# Patient Record
Sex: Male | Born: 1960 | Race: White | Hispanic: No | Marital: Married | Smoking: Former smoker
Health system: Southern US, Community
[De-identification: ages and names within clinical notes are randomized; demographics above are authoritative.]

## PROBLEM LIST (undated history)

## (undated) DIAGNOSIS — M109 Gout, unspecified: Secondary | ICD-10-CM

## (undated) DIAGNOSIS — I10 Essential (primary) hypertension: Secondary | ICD-10-CM

## (undated) DIAGNOSIS — M1990 Unspecified osteoarthritis, unspecified site: Secondary | ICD-10-CM

## (undated) DIAGNOSIS — E119 Type 2 diabetes mellitus without complications: Secondary | ICD-10-CM

## (undated) DIAGNOSIS — M549 Dorsalgia, unspecified: Secondary | ICD-10-CM

## (undated) DIAGNOSIS — Z86718 Personal history of other venous thrombosis and embolism: Secondary | ICD-10-CM

## (undated) DIAGNOSIS — C801 Malignant (primary) neoplasm, unspecified: Secondary | ICD-10-CM

## (undated) HISTORY — DX: Gout, unspecified: M10.9

## (undated) HISTORY — DX: Type 2 diabetes mellitus without complications: E11.9

## (undated) HISTORY — DX: Dorsalgia, unspecified: M54.9

## (undated) HISTORY — DX: Essential (primary) hypertension: I10

## (undated) HISTORY — DX: Malignant (primary) neoplasm, unspecified: C80.1

## (undated) HISTORY — DX: Personal history of other venous thrombosis and embolism: Z86.718

---

## 2003-02-22 ENCOUNTER — Encounter: Payer: Self-pay | Admitting: Internal Medicine

## 2003-02-22 ENCOUNTER — Encounter: Admission: RE | Admit: 2003-02-22 | Discharge: 2003-02-22 | Payer: Self-pay | Admitting: Internal Medicine

## 2003-02-25 ENCOUNTER — Encounter: Admission: RE | Admit: 2003-02-25 | Discharge: 2003-02-25 | Payer: Self-pay | Admitting: Internal Medicine

## 2003-02-25 ENCOUNTER — Encounter: Payer: Self-pay | Admitting: Internal Medicine

## 2003-06-10 HISTORY — PX: KNEE SURGERY: SHX244

## 2003-06-28 ENCOUNTER — Ambulatory Visit (HOSPITAL_COMMUNITY): Admission: RE | Admit: 2003-06-28 | Discharge: 2003-06-28 | Payer: Self-pay | Admitting: Orthopedic Surgery

## 2003-06-28 ENCOUNTER — Ambulatory Visit (HOSPITAL_BASED_OUTPATIENT_CLINIC_OR_DEPARTMENT_OTHER): Admission: RE | Admit: 2003-06-28 | Discharge: 2003-06-28 | Payer: Self-pay | Admitting: Orthopedic Surgery

## 2003-08-30 ENCOUNTER — Ambulatory Visit (HOSPITAL_BASED_OUTPATIENT_CLINIC_OR_DEPARTMENT_OTHER): Admission: RE | Admit: 2003-08-30 | Discharge: 2003-08-30 | Payer: Self-pay | Admitting: Orthopedic Surgery

## 2004-09-10 ENCOUNTER — Inpatient Hospital Stay (HOSPITAL_COMMUNITY): Admission: AD | Admit: 2004-09-10 | Discharge: 2004-09-14 | Payer: Self-pay | Admitting: Family Medicine

## 2004-09-10 ENCOUNTER — Ambulatory Visit: Payer: Self-pay | Admitting: Family Medicine

## 2006-06-09 DIAGNOSIS — C801 Malignant (primary) neoplasm, unspecified: Secondary | ICD-10-CM | POA: Insufficient documentation

## 2006-06-09 HISTORY — DX: Malignant (primary) neoplasm, unspecified: C80.1

## 2007-06-10 HISTORY — PX: OTHER SURGICAL HISTORY: SHX169

## 2010-03-01 ENCOUNTER — Ambulatory Visit (HOSPITAL_BASED_OUTPATIENT_CLINIC_OR_DEPARTMENT_OTHER): Admission: RE | Admit: 2010-03-01 | Discharge: 2010-03-01 | Payer: Self-pay | Admitting: General Surgery

## 2010-10-25 NOTE — Op Note (Signed)
NAME:  Steven Hodge, Steven Hodge                           ACCOUNT NO.:  [id number]   MEDICAL RECORD NO.:  [id number]                   PATIENT TYPE:  AMB   LOCATION:  DSC                                  FACILITY:  [facility]   PHYSICIAN:  Thera Flake., M.D.             DATE OF BIRTH:  Dec 03, 1960   DATE OF PROCEDURE:  08/30/2003  DATE OF DISCHARGE:                                 OPERATIVE REPORT   PREOPERATIVE DIAGNOSIS:  Partial tear medial meniscus repair, right knee.   POSTOPERATIVE DIAGNOSIS:  Partial tear medial meniscus repair, right knee.   OPERATION PERFORMED:  Partial medial meniscectomy (30 to 40%).   SURGEON:  Dyke Brackett, M.D.   ANESTHESIA:  MAC.   INDICATIONS FOR PROCEDURE:  Right knee status post anterior cruciate  ligament meniscus repair, repetitive catching consistent with disruption of  his meniscus repair thought to be amenable to outpatient surgery.   DESCRIPTION OF PROCEDURE:  Arthroscoped through inferomedial and  inferolateral portal.  ACL graft looked excellent with good positioning and  good strength lateral compartment, lateral meniscus, patellofemoral joint  was by and large normal.  The medial meniscus, probably thought that  probably half the repair had healed.  The posterior horn portion had not.  This required resection of 30 to 40% of the meniscus back to a good stable  rim.  This was certainly consistent with preoperative complaints of catching  along the medial aspect of the knee.  Articular cartilage on either side of  this looked normal.  Knee drained free of fluids.  Portals of joint  infiltrated with Marcaine.  The patient was taken to the recovery room in  stable condition.                                               Thera Flake., M.D.    WDC/MEDQ  D:  08/30/2003  T:  08/31/2003  Job:  [id number]

## 2010-10-25 NOTE — Op Note (Signed)
NAME:  Steven Hodge, Steven Hodge                           ACCOUNT NO.:  [id number]   MEDICAL RECORD NO.:  [id number]                   PATIENT TYPE:  AMB   LOCATION:  DSC                                  FACILITY:  [facility]   PHYSICIAN:  Thera Flake., M.D.             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  Based on his age  and the presence of rather significant chondromalacia, he was advised he was  best off probably to consider reconstruction with allograft.  This will be  accomplished with overnight hospitalization.   PREOPERATIVE DIAGNOSIS:  1. 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  2. Posterior horn tear, medial meniscus, right knee.  3. Chondromalacia central and inferior patella, right knee.   POSTOPERATIVE DIAGNOSIS:  1. 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  2. Posterior horn tear, medial meniscus, right knee.  3. Chondromalacia central and inferior patella, right knee.   OPERATION:  1. Bone tendon bone allograft anterior cruciate ligament reconstruction,     right knee.  2. Meniscus repair (3 Clear Fix) medial meniscus.  3. Debridement chondromalacia of the patella.   SURGEON:  Dyke Brackett, M.D.   ANESTHESIA:  Jamelle Rushing, P.A.   TOURNIQUET TIME:  1 hour 30 minutes.   DESCRIPTION OF PROCEDURE:  Examination under anesthesia showed the patient  to have a positive Lachman 4+, 4+ pivot shift.  Post reconstruction it was  trace to 1+ Lachman present and no pivot shift.  He was arthroscoped through  an inferomedial and inferolateral portal.  The lateral compartment was  normal.  The medial meniscus showed a red white tear of the posterior horn  that fortunately was  repairable and was repaired with three Clear Fix  anchors with good purchase of meniscal tissues.  Prior to this, the meniscus  edges were roughened.  There was chondromalacia patella on the inferior half  of the patella and the patellofemoral joint was aggressively debrided.  A  bone tendon bone 11 mm allograft was prepared at the side table.  This was  followed by what was required as an aggressive notchplasty in view of  significant notch overgrowth.  The guide pin was placed on the tibia using  the Arthrex system referencing off the PCL just posterior to the normal  attachment site of the ACL.  Over reaming with an 11 mm reamer.  The bone  was moderately soft and then the Arthrex guide was used through the tibial  tunnel on the femoral side to create a pin site followed by reaming.  Once  the pin was placed all the way through  the metaphysis and tagged with the  holder, it was over-reamed with an 11 mm reamer to accept the graft.  The  graft was passed without difficulty.  Due to the rather soft bone, 30 by 9  mm screw was used on the femoral side and a 9 by 25 on the tibial side.  Excellent purchase was obtained.  Excellent position of the graft was seen.  A small amount of impingement from the medial portion of the lateral condyle  was noted and this was rasped off so there was absolutely no impingement of  the graft once this was done.  Full range of motion with no impingement.  Excellent stability noted.  Closure was effected with 0 Vicryl.  Accessory  incisions had been made, one through the patellar tendon for placement of  the guide-wire into the femur as well as one on the tibial side for  placement of the graft and these were the portals that were closed.  A  compressive sterile dressing was applied.  The patient went to the recovery  room in stable condition.                                               Thera Flake., M.D.    WDC/MEDQ  D:  06/28/2003  T:  06/28/2003   Job:  [id number]

## 2010-10-25 NOTE — Discharge Summary (Signed)
NAME:  XANE, AMSDEN NO.:  [id number]   MEDICAL RECORD NO.:  [id number]          PATIENT TYPE:  INP   LOCATION:  3008                         FACILITY:  [facility]   PHYSICIAN:  Rodolph Bong, M.D.     DATE OF BIRTH:  [birth year]   DATE OF ADMISSION:  09/10/2004  DATE OF DISCHARGE:  09/14/2004                                 DISCHARGE SUMMARY   PRIMARY CARE PHYSICIAN:  [facility].   DISCHARGE DIAGNOSIS:  Left-sided facial cellulitis, presumed methicillin  resistant Staphylococcus aureus.   DISCHARGE MEDICATIONS:  1.  Vancomycin 1500 mg IV q.12h. through Sunday, September 15, 2004.  2.  Doxycycline 100 mg p.o. b.i.d. for seven days after vancomycin      completed.  3.  Aspirin 81 mg daily.   BRIEF HISTORY AND PHYSICAL:  Mr. Ardoin is a pleasant 50 year old male who  presents with three day history of swelling in the left nasolabial area,  progressing to his entire left maxillary region.  This area was tender and  he reported chills prior to admission.  He was seen at Urgent Care and  empirically started on Bactrim for MRSA coverage.  The area began to  progress in terms of the swelling and erythema and he was sent to [facility] from Urgent Care.   HOSPITAL COURSE:  1.  Left facial cellulitis.  The patient was admitted with a history as      outlined above.  He was found to have a left facial cellulitis and was      begun on vancomycin therapy.  In addition Rocephin was used for      additional gram negative coverage.  Facial CT revealed left facial      cellulitis with element of preorbital cellulitis but no invasion or      intraorbital or retroorbital structures.  ENT was consulted who      recommended continuing IV antibiotics and reevaluation for progression      and possible necessary I&D.  While in the hospital Mr. Desena improved on      the IV vancomycin, only discharged home to complete a course for two      more days and then a week  additionally of doxycycline.  On the day of      discharge Mr. Danowski was comfortable and had significant improvement in      his cellulitis.   PAIN MANAGEMENT:  Ibuprofen 800 mg q.8h. p.r.n.   DIET:  Regular.   WOUND CARE:  Not applicable.   FOLLOW UP:  1.  Dr. Pollyann Kennedy at [phone].  Mr. Weatherly is to call on Monday or Tuesday for a      follow-up appointment.  2.  [facility] follow-up as needed.      AK/MEDQ  D:  09/14/2004  T:  09/14/2004  Job:  [id number]   cc:   [facility]   Jefry H. Pollyann Kennedy, MD  [phone] [address] [place]  [place] [address]  Fax: [phone]

## 2013-01-25 ENCOUNTER — Ambulatory Visit: Payer: BC Managed Care – PPO

## 2013-01-25 ENCOUNTER — Ambulatory Visit (INDEPENDENT_AMBULATORY_CARE_PROVIDER_SITE_OTHER): Payer: BC Managed Care – PPO | Admitting: Physician Assistant

## 2013-01-25 VITALS — BP 146/88 | HR 85 | Temp 98.3°F | Resp 18 | Ht 68.5 in | Wt 303.2 lb

## 2013-01-25 DIAGNOSIS — M79675 Pain in left toe(s): Secondary | ICD-10-CM

## 2013-01-25 DIAGNOSIS — M109 Gout, unspecified: Secondary | ICD-10-CM

## 2013-01-25 DIAGNOSIS — M79609 Pain in unspecified limb: Secondary | ICD-10-CM

## 2013-01-25 LAB — POCT CBC
Granulocyte percent: 66 %G (ref 37–80)
HCT, POC: 47.2 % (ref 43.5–53.7)
Hemoglobin: 15.5 g/dL (ref 14.1–18.1)
Lymph, poc: 1.9 (ref 0.6–3.4)
MCH, POC: 31.1 pg (ref 27–31.2)
MCHC: 32.8 g/dL (ref 31.8–35.4)
MCV: 94.5 fL (ref 80–97)
MID (cbc): 0.7 (ref 0–0.9)
MPV: 11.4 fL (ref 0–99.8)
POC Granulocyte: 5.1 (ref 2–6.9)
POC LYMPH PERCENT: 25.1 %L (ref 10–50)
POC MID %: 8.9 %M (ref 0–12)
Platelet Count, POC: 177 10*3/uL (ref 142–424)
RBC: 4.99 M/uL (ref 4.69–6.13)
RDW, POC: 13.6 %
WBC: 7.7 10*3/uL (ref 4.6–10.2)

## 2013-01-25 LAB — BASIC METABOLIC PANEL
BUN: 13 mg/dL (ref 6–23)
CO2: 23 mEq/L (ref 19–32)
Calcium: 9.4 mg/dL (ref 8.4–10.5)
Chloride: 103 mEq/L (ref 96–112)
Creat: 0.93 mg/dL (ref 0.50–1.35)
Glucose, Bld: 154 mg/dL — ABNORMAL HIGH (ref 70–99)
Potassium: 4.4 mEq/L (ref 3.5–5.3)
Sodium: 137 mEq/L (ref 135–145)

## 2013-01-25 LAB — URIC ACID: Uric Acid, Serum: 7.1 mg/dL (ref 4.0–7.8)

## 2013-01-25 MED ORDER — HYDROCODONE-ACETAMINOPHEN 5-325 MG PO TABS: 1.0000 | ORAL_TABLET | Freq: Four times a day (QID) | ORAL | Status: DC | PRN

## 2013-01-25 MED ORDER — PREDNISONE 20 MG PO TABS: ORAL_TABLET | ORAL | Status: DC

## 2013-01-25 NOTE — Progress Notes (Signed)
Subjective:    Patient ID: Steven Hodge, male    DOB: 1960-07-13, 52 y.o.   MRN: [id number]  HPI 52 y.o. Patient with left foot pain x 6 days. Patient noticed his left great toe hurting on Thursday.  No known injury or trauma. It began as mild swelling and read area and progressed to swelling and painful red area. It began to really hurt Friday and Saturday, improved on Sunday but yesterday it was very painful. He has been taking 1200 mg/day of Ibuprofen since Friday with some relief. He states that it looks and feels much better today. He has not had any change in diet or activity. He does eat red meat 2-3 x/week and shell fish. He says that he tries to watch his diet and decrease his fried food intake but states that he "not perfect". He does not drink any alcohol and no tobacco use. He has history of gout in his left elbow with first flare at 52 y.o. He has had one other flare since then but it has been many years. He was never given anything for it. He has NKDA.    Review of Systems  Constitutional: Negative for fever, chills and fatigue.  HENT: Negative for congestion, sore throat, trouble swallowing and sinus pressure.   Eyes: Negative for visual disturbance.  Respiratory: Negative for choking, chest tightness and shortness of breath.   Cardiovascular: Negative for chest pain, palpitations and leg swelling.  Gastrointestinal: Negative for nausea, vomiting, abdominal pain and diarrhea.  Genitourinary: Negative for difficulty urinating.  Musculoskeletal: Positive for joint swelling and arthralgias. Negative for myalgias.  Skin: Negative for pallor and rash.  Neurological: Negative for light-headedness and headaches.  All other systems reviewed and are negative.      Objective:   Physical Exam  Nursing note and vitals reviewed. Constitutional: He is oriented to person, place, and time. Vital signs are normal. He appears well-developed and well-nourished. No distress.  HENT:  Head:  Normocephalic and atraumatic.  Right Ear: External ear normal.  Left Ear: External ear normal.  Nose: Nose normal.  Eyes: Conjunctivae and lids are normal.  Neck: Trachea normal and normal range of motion. Neck supple. No thyromegaly present.  Cardiovascular: Normal rate, regular rhythm and normal heart sounds.   Pulmonary/Chest: Effort normal and breath sounds normal.  Musculoskeletal:       Left ankle: He exhibits decreased range of motion (great toe, 2nd to joint stiffness) and swelling. He exhibits no ecchymosis, no deformity, no laceration and normal pulse. Tenderness (of MTP). No lateral malleolus, no medial malleolus, no CF ligament, no posterior TFL and no head of 5th metatarsal tenderness found. Achilles tendon normal.       Feet:  Erythema, warmth of 1st MTP joint; decrease ROM secondary to stiffness and pain in the joint  Lymphadenopathy:    He has no cervical adenopathy.  Neurological: He is alert and oriented to person, place, and time. He has normal strength and normal reflexes. No cranial nerve deficit or sensory deficit.  Skin: Skin is warm, dry and intact. No rash noted. He is not diaphoretic. No pallor.  Psychiatric: He has a normal mood and affect. His speech is normal and behavior is normal. Judgment and thought content normal. Cognition and memory are normal.   Results for orders placed in visit on 01/25/13  POCT CBC      Result Value Range   WBC 7.7  4.6 - 10.2 K/uL   Lymph, poc 1.9  0.6 -  3.4   POC LYMPH PERCENT 25.1  10 - 50 %L   MID (cbc) 0.7  0 - 0.9   POC MID % 8.9  0 - 12 %M   POC Granulocyte 5.1  2 - 6.9   Granulocyte percent 66.0  37 - 80 %G   RBC 4.99  4.69 - 6.13 M/uL   Hemoglobin 15.5  14.1 - 18.1 g/dL   HCT, POC 16.1  09.6 - 53.7 %   MCV 94.5  80 - 97 fL   MCH, POC 31.1  27 - 31.2 pg   MCHC 32.8  31.8 - 35.4 g/dL   RDW, POC 04.5     Platelet Count, POC 177  142 - 424 K/uL   MPV 11.4  0 - 99.8 fL   Radiograph of left Great toe primary read by  Dr. Dallas Schimke. Joint space normal. Trophic changes present at MTP joint of 1st metatarsal. No acute abnormalities.     Assessment & Plan:  Gout -  Plan: POCT CBC, UricAcid, Basic metabolic panel, DG Toe Great Left, predniSONE (DELTASONE) 20 MG tablet. Discussed with patient prednisone taper for immediate relief of gout flare. Patient declined Norco for pain due to previous poor experience taking medication. He will take Tylenol if needed for pain.   Will contact patient tomorrow with his lab results. Based on results of uric acid level will discuss with patient dietary and lifestyle modifications as well as maintenance therapy if indicated.   Patient seen with and examined by Eula Listen, PA-C. Agree with above.   Eula Listen, PA-C 01/25/2013 6:31 PM

## 2013-01-26 ENCOUNTER — Other Ambulatory Visit: Payer: Self-pay | Admitting: Physician Assistant

## 2013-01-26 DIAGNOSIS — M109 Gout, unspecified: Secondary | ICD-10-CM

## 2015-02-27 ENCOUNTER — Ambulatory Visit (INDEPENDENT_AMBULATORY_CARE_PROVIDER_SITE_OTHER): Payer: BLUE CROSS/BLUE SHIELD | Admitting: Internal Medicine

## 2015-02-27 ENCOUNTER — Other Ambulatory Visit: Payer: Self-pay | Admitting: Internal Medicine

## 2015-02-27 ENCOUNTER — Ambulatory Visit (HOSPITAL_COMMUNITY)
Admission: RE | Admit: 2015-02-27 | Discharge: 2015-02-27 | Disposition: A | Discharge status: Home / Self Care | Payer: BLUE CROSS/BLUE SHIELD | Source: Ambulatory Visit | Attending: Internal Medicine | Admitting: Internal Medicine

## 2015-02-27 ENCOUNTER — Ambulatory Visit (INDEPENDENT_AMBULATORY_CARE_PROVIDER_SITE_OTHER): Payer: BLUE CROSS/BLUE SHIELD

## 2015-02-27 VITALS — BP 122/76 | HR 87 | Temp 98.9°F | Resp 20 | Ht 68.75 in | Wt 265.4 lb

## 2015-02-27 DIAGNOSIS — M79601 Pain in right arm: Secondary | ICD-10-CM

## 2015-02-27 DIAGNOSIS — M79661 Pain in right lower leg: Secondary | ICD-10-CM | POA: Insufficient documentation

## 2015-02-27 DIAGNOSIS — M25461 Effusion, right knee: Secondary | ICD-10-CM | POA: Diagnosis not present

## 2015-02-27 DIAGNOSIS — M25561 Pain in right knee: Secondary | ICD-10-CM | POA: Diagnosis not present

## 2015-02-27 DIAGNOSIS — E11628 Type 2 diabetes mellitus with other skin complications: Secondary | ICD-10-CM

## 2015-02-27 DIAGNOSIS — R7309 Other abnormal glucose: Secondary | ICD-10-CM | POA: Diagnosis not present

## 2015-02-27 DIAGNOSIS — R509 Fever, unspecified: Secondary | ICD-10-CM

## 2015-02-27 DIAGNOSIS — M79604 Pain in right leg: Secondary | ICD-10-CM

## 2015-02-27 DIAGNOSIS — L03115 Cellulitis of right lower limb: Secondary | ICD-10-CM

## 2015-02-27 LAB — POCT CBC
Granulocyte percent: 79.9 %G (ref 37–80)
HCT, POC: 47 % (ref 43.5–53.7)
Hemoglobin: 15.5 g/dL (ref 14.1–18.1)
Lymph, poc: 2.2 (ref 0.6–3.4)
MCH, POC: 28.7 pg (ref 27–31.2)
MCHC: 32.9 g/dL (ref 31.8–35.4)
MCV: 87 fL (ref 80–97)
MID (cbc): 0.5 (ref 0–0.9)
MPV: 9 fL (ref 0–99.8)
POC Granulocyte: 10.7 — AB (ref 2–6.9)
POC LYMPH PERCENT: 16.6 %L (ref 10–50)
POC MID %: 3.5 %M (ref 0–12)
Platelet Count, POC: 166 10*3/uL (ref 142–424)
RBC: 5.41 M/uL (ref 4.69–6.13)
RDW, POC: 12.4 %
WBC: 13.4 10*3/uL — AB (ref 4.6–10.2)

## 2015-02-27 LAB — POCT URINALYSIS DIP (MANUAL ENTRY)
Bilirubin, UA: NEGATIVE
Glucose, UA: 500 — AB
Leukocytes, UA: NEGATIVE
Nitrite, UA: NEGATIVE
Protein Ur, POC: 100 — AB
Spec Grav, UA: 1.01
Urobilinogen, UA: 0.2
pH, UA: 5.5

## 2015-02-27 LAB — POCT GLYCOSYLATED HEMOGLOBIN (HGB A1C): Hemoglobin A1C: 12.1

## 2015-02-27 LAB — COMPREHENSIVE METABOLIC PANEL
ALT: 58 U/L — ABNORMAL HIGH (ref 9–46)
AST: 14 U/L (ref 10–35)
Albumin: 4 g/dL (ref 3.6–5.1)
Alkaline Phosphatase: 78 U/L (ref 40–115)
BUN: 11 mg/dL (ref 7–25)
CO2: 24 mmol/L (ref 20–31)
Calcium: 9 mg/dL (ref 8.6–10.3)
Chloride: 99 mmol/L (ref 98–110)
Creat: 0.77 mg/dL (ref 0.70–1.33)
Glucose, Bld: 362 mg/dL — ABNORMAL HIGH (ref 65–99)
Potassium: 4.1 mmol/L (ref 3.5–5.3)
Sodium: 136 mmol/L (ref 135–146)
Total Bilirubin: 1 mg/dL (ref 0.2–1.2)
Total Protein: 6.9 g/dL (ref 6.1–8.1)

## 2015-02-27 LAB — POC MICROSCOPIC URINALYSIS (UMFC): Mucus: ABSENT

## 2015-02-27 LAB — GLUCOSE, POCT (MANUAL RESULT ENTRY): POC Glucose: 356 mg/dl — AB (ref 70–99)

## 2015-02-27 MED ORDER — GLIPIZIDE 5 MG PO TABS: 5.0000 mg | ORAL_TABLET | Freq: Two times a day (BID) | ORAL | Status: DC

## 2015-02-27 MED ORDER — METFORMIN HCL 500 MG PO TABS: 500.0000 mg | ORAL_TABLET | Freq: Two times a day (BID) | ORAL | Status: DC

## 2015-02-27 MED ORDER — DOXYCYCLINE HYCLATE 100 MG PO TABS: 100.0000 mg | ORAL_TABLET | Freq: Two times a day (BID) | ORAL | Status: DC

## 2015-02-27 MED ORDER — CEFTRIAXONE SODIUM 1 G IJ SOLR
1.0000 g | Freq: Once | INTRAMUSCULAR | Status: AC
  Administered 2015-02-27: 1 g via INTRAMUSCULAR

## 2015-02-27 NOTE — Progress Notes (Signed)
Preliminary report by tech - Right Lower Ext. Venous Duplex Completed. Negative for deep and superficial vein thrombosis in the right lower extremity. Oda Cogan, BS, RDMS, RVT

## 2015-02-27 NOTE — Patient Instructions (Addendum)
Please go to [facility] for your scheduled Venous Doppler at 11 am today. Check in through admitting which is located off of [address] Someone from the Radiology department will come and get you from there.       Cellulitis Cellulitis is an infection of the skin and the tissue beneath it. The infected area is usually red and tender. Cellulitis occurs most often in the arms and lower legs.  CAUSES  Cellulitis is caused by bacteria that enter the skin through cracks or cuts in the skin. The most common types of bacteria that cause cellulitis are staphylococci and streptococci. SIGNS AND SYMPTOMS   Redness and warmth.  Swelling.  Tenderness or pain.  Fever. DIAGNOSIS  Your health care provider can usually determine what is wrong based on a physical exam. Blood tests may also be done. TREATMENT  Treatment usually involves taking an antibiotic medicine. HOME CARE INSTRUCTIONS   Take your antibiotic medicine as directed by your health care provider. Finish the antibiotic even if you start to feel better.  Keep the infected arm or leg elevated to reduce swelling.  Apply a warm cloth to the affected area up to 4 times per day to relieve pain.  Take medicines only as directed by your health care provider.  Keep all follow-up visits as directed by your health care provider. SEEK MEDICAL CARE IF:   You notice red streaks coming from the infected area.  Your red area gets larger or turns dark in color.  Your bone or joint underneath the infected area becomes painful after the skin has healed.  Your infection returns in the same area or another area.  You notice a swollen bump in the infected area.  You develop new symptoms.  You have a fever. SEEK IMMEDIATE MEDICAL CARE IF:   You feel very sleepy.  You develop vomiting or diarrhea.  You have a general ill feeling (malaise) with muscle aches and pains. MAKE SURE YOU:   Understand these  instructions.  Will watch your condition.  Will get help right away if you are not doing well or get worse. Document Released: 03/05/2005 Document Revised: 10/10/2013 Document Reviewed: 08/11/2011 [facility] Patient Information 2015 [place], [place]. This information is not intended to replace advice given to you by your health care provider. Make sure you discuss any questions you have with your health care provider. Basic Carbohydrate Counting for Diabetes Mellitus Carbohydrate counting is a method for keeping track of the amount of carbohydrates you eat. Eating carbohydrates naturally increases the level of sugar (glucose) in your blood, so it is important for you to know the amount that is okay for you to have in every meal. Carbohydrate counting helps keep the level of glucose in your blood within normal limits. The amount of carbohydrates allowed is different for every person. A dietitian can help you calculate the amount that is right for you. Once you know the amount of carbohydrates you can have, you can count the carbohydrates in the foods you want to eat. Carbohydrates are found in the following foods:  Grains, such as breads and cereals.  Dried beans and soy products.  Starchy vegetables, such as potatoes, peas, and corn.  Fruit and fruit juices.  Milk and yogurt.  Sweets and snack foods, such as cake, cookies, candy, chips, soft drinks, and fruit drinks. CARBOHYDRATE COUNTING There are two ways to count the carbohydrates in your food. You can use either of the methods or a combination of both.  Reading the "Nutrition Facts" on [place] The "Nutrition Facts" is an area that is included on the labels of almost all packaged food and beverages in the [place]. It includes the serving size of that food or beverage and information about the nutrients in each serving of the food, including the grams (g) of carbohydrate per serving.  Decide the number of servings of this food or  beverage that you will be able to eat or drink. Multiply that number of servings by the number of grams of carbohydrate that is listed on the label for that serving. The total will be the amount of carbohydrates you will be having when you eat or drink this food or beverage. Learning Standard Serving Sizes of Food When you eat food that is not packaged or does not include "Nutrition Facts" on the label, you need to measure the servings in order to count the amount of carbohydrates.A serving of most carbohydrate-rich foods contains about 15 g of carbohydrates. The following list includes serving sizes of carbohydrate-rich foods that provide 15 g ofcarbohydrate per serving:   1 slice of bread (1 oz) or 1 six-inch tortilla.    of a hamburger bun or English muffin.  4-6 crackers.   cup unsweetened dry cereal.    cup hot cereal.   cup rice or pasta.    cup mashed potatoes or  of a large baked potato.  1 cup fresh fruit or one small piece of fruit.    cup canned or frozen fruit or fruit juice.  1 cup milk.   cup plain fat-free yogurt or yogurt sweetened with artificial sweeteners.   cup cooked dried beans or starchy vegetable, such as peas, corn, or potatoes.  Decide the number of standard-size servings that you will eat. Multiply that number of servings by 15 (the grams of carbohydrates in that serving). For example, if you eat 2 cups of strawberries, you will have eaten 2 servings and 30 g of carbohydrates (2 servings x 15 g = 30 g). For foods such as soups and casseroles, in which more than one food is mixed in, you will need to count the carbohydrates in each food that is included. EXAMPLE OF CARBOHYDRATE COUNTING Sample Dinner  3 oz chicken breast.   cup of brown rice.   cup of corn.  1 cup milk.   1 cup strawberries with sugar-free whipped topping.  Carbohydrate Calculation Step 1: Identify the foods that contain carbohydrates:   Rice.   Corn.    Milk.   Strawberries. Step 2:Calculate the number of servings eaten of each:   2 servings of rice.   1 serving of corn.   1 serving of milk.   1 serving of strawberries. Step 3: Multiply each of those number of servings by 15 g:   2 servings of rice x 15 g = 30 g.   1 serving of corn x 15 g = 15 g.   1 serving of milk x 15 g = 15 g.   1 serving of strawberries x 15 g = 15 g. Step 4: Add together all of the amounts to find the total grams of carbohydrates eaten: 30 g + 15 g + 15 g + 15 g = 75 g. Document Released: 05/26/2005 Document Revised: 10/10/2013 Document Reviewed: 04/22/2013 [facility] Patient Information 2015 [place], [place]. This information is not intended to replace advice given to you by your health care provider. Make sure you discuss any questions you have with your  health care provider.  

## 2015-02-27 NOTE — Progress Notes (Signed)
Patient ID: SY Steven Hodge, male   DOB: November 25, 1960, 54 y.o.   MRN: [id number]   02/27/2015 at 9:54 AM  Steven Hodge / DOB: 1960-11-13 / MRN: [id number]  Problem list reviewed and updated by me where necessary.   SUBJECTIVE  Steven Hodge is a 54 y.o. ill appearing male presenting for the chief complaint of right leg and knee pain with swelling and redness. Started 3-4 days ago with chills, night sweats and no rash seen then. .   Has past hx of infection to right knee after ACL repair, years ago. No cellulitis since. Has not had check up or testing in many years, last glucose on record was 154, he did not know about it.   He  has no past medical history on file.    Medications reviewed and updated by myself where necessary, and exist elsewhere in the encounter.   Steven Hodge has No Known Allergies. He  reports that he has quit smoking. He does not have any smokeless tobacco history on file. He reports that he does not drink alcohol or use illicit drugs. He  reports that he currently engages in sexual activity. The patient  has past surgical history that includes Knee surgery (2005); basel cell cancer removed (2009); and Joint replacement.  His family history includes Cancer in his brother, father, mother, and sister; Heart disease in his brother.  Review of Systems  Constitutional: Positive for fever, chills and diaphoresis.  Respiratory: Negative for cough and shortness of breath.   Cardiovascular: Positive for leg swelling. Negative for chest pain.  Gastrointestinal: Negative for nausea.  Musculoskeletal: Positive for myalgias.  Skin: Positive for rash.  Neurological: Negative for dizziness and headaches.    OBJECTIVE  His  height is 5' 8.75" (1.746 m) and weight is 265 lb 6.4 oz (120.385 kg). His oral temperature is 98.9 F (37.2 C). His blood pressure is 122/76 and his pulse is 87. His respiration is 20 and oxygen saturation is 98%.  The patient's body mass index is 39.49  kg/(m^2).  Physical Exam  Constitutional: He is oriented to person, place, and time. He appears well-developed and well-nourished. No distress.  HENT:  Head: Normocephalic.  Nose: Nose normal.  Eyes: Conjunctivae and EOM are normal.  Neck: Normal range of motion.  Cardiovascular: Normal rate, regular rhythm and normal heart sounds.   Respiratory: Effort normal and breath sounds normal. He exhibits no tenderness.  GI: Soft. He exhibits no mass. There is no tenderness.  Musculoskeletal: He exhibits edema and tenderness.       Right knee: He exhibits decreased range of motion, swelling and erythema. He exhibits no effusion.       Right lower leg: He exhibits tenderness, swelling and edema. He exhibits no bony tenderness, no deformity and no laceration.       Legs: Swollen, tender, red, to palpate  Knee joint appears not to be involved, has no pain with rom  Neurological: He is alert and oriented to person, place, and time. He exhibits normal muscle tone. Coordination normal.  Skin: Rash noted. There is erythema.  Psychiatric: He has a normal mood and affect.  [facility] reading (PRIMARY) by  Dr.Guest.knee no acute changes, osteoarthritis moderate, surgical pegs in place.    Results for orders placed or performed in visit on 02/27/15 (from the past 24 hour(s))  POCT urinalysis dipstick     Status: Abnormal   Collection Time: 02/27/15  9:00 AM  Result Value Ref Range  Color, UA yellow yellow   Clarity, UA clear clear   Glucose, UA =500 (A) negative   Bilirubin, UA negative negative   Ketones, POC UA moderate (40) (A) negative   Spec Grav, UA 1.010    Blood, UA trace-lysed (A) negative   pH, UA 5.5    Protein Ur, POC =100 (A) negative   Urobilinogen, UA 0.2    Nitrite, UA Negative Negative   Leukocytes, UA Negative Negative  POCT Microscopic Urinalysis ([facility])     Status: Abnormal   Collection Time: 02/27/15  9:00 AM  Result Value Ref Range   WBC,UR,HPF,POC Few (A) None WBC/hpf    RBC,UR,HPF,POC Few (A) None RBC/hpf   Bacteria None None   Mucus Absent Absent   Epithelial Cells, UR Per Microscopy None None cells/hpf  POCT CBC     Status: Abnormal   Collection Time: 02/27/15  9:31 AM  Result Value Ref Range   WBC 13.4 (A) 4.6 - 10.2 K/uL   Lymph, poc 2.2 0.6 - 3.4   POC LYMPH PERCENT 16.6 10 - 50 %L   MID (cbc) 0.5 0 - 0.9   POC MID % 3.5 0 - 12 %M   POC Granulocyte 10.7 (A) 2 - 6.9   Granulocyte percent 79.9 37 - 80 %G   RBC 5.41 4.69 - 6.13 M/uL   Hemoglobin 15.5 14.1 - 18.1 g/dL   HCT, POC 47.0 43.5 - 53.7 %   MCV 87.0 80 - 97 fL   MCH, POC 28.7 27 - 31.2 pg   MCHC 32.9 31.8 - 35.4 g/dL   RDW, POC 12.4 %   Platelet Count, POC 166 142 - 424 K/uL   MPV 9.0 0 - 99.8 fL  POCT glucose (manual entry)     Status: Abnormal   Collection Time: 02/27/15  9:31 AM  Result Value Ref Range   POC Glucose 356 (A) 70 - 99 mg/dl  POCT glycosylated hemoglobin (Hb A1C)     Status: None   Collection Time: 02/27/15  9:31 AM  Result Value Ref Range   Hemoglobin A1C 12.1     ASSESSMENT & PLAN  Steven Hodge was seen today for leg problem.  Diagnoses and all orders for this visit:  Pain of knee and lower leg, right -     POCT CBC -     POCT glucose (manual entry) -     POCT glycosylated hemoglobin (Hb A1C) -     POCT urinalysis dipstick -     POCT Microscopic Urinalysis ([facility]) -     Comprehensive metabolic panel -     Cancel: DG Knee 1-2 Views Left; Future -     Cancel: DG Knee 1-2 Views Right; Future -     LE VENOUS; Future  Fever chills -     POCT CBC -     POCT glucose (manual entry) -     POCT glycosylated hemoglobin (Hb A1C) -     POCT urinalysis dipstick -     POCT Microscopic Urinalysis ([facility]) -     Comprehensive metabolic panel -     Cancel: DG Knee 1-2 Views Left; Future -     LE VENOUS; Future  Cellulitis of leg, right -     POCT CBC -     POCT glucose (manual entry) -     POCT glycosylated hemoglobin (Hb A1C) -     POCT urinalysis dipstick -     POCT  Microscopic Urinalysis ([facility]) -  Comprehensive metabolic panel -     Cancel: DG Knee 1-2 Views Left; Future -     LE VENOUS; Future  Swollen R knee -     POCT CBC -     POCT glucose (manual entry) -     POCT glycosylated hemoglobin (Hb A1C) -     POCT urinalysis dipstick -     POCT Microscopic Urinalysis ([facility]) -     Comprehensive metabolic panel -     Cancel: DG Knee 1-2 Views Left; Future -     Cancel: DG Knee 1-2 Views Right; Future -     LE VENOUS; Future  Elevated glucose -     POCT CBC -     POCT glucose (manual entry) -     POCT glycosylated hemoglobin (Hb A1C) -     POCT urinalysis dipstick -     POCT Microscopic Urinalysis ([facility]) -     Comprehensive metabolic panel -     Cancel: DG Knee 1-2 Views Left; Future -     LE VENOUS; Future

## 2015-02-28 ENCOUNTER — Ambulatory Visit (INDEPENDENT_AMBULATORY_CARE_PROVIDER_SITE_OTHER): Payer: BLUE CROSS/BLUE SHIELD | Admitting: Family Medicine

## 2015-02-28 VITALS — BP 118/80 | HR 87 | Temp 99.7°F | Resp 16 | Ht 68.0 in | Wt 264.0 lb

## 2015-02-28 DIAGNOSIS — L03115 Cellulitis of right lower limb: Secondary | ICD-10-CM | POA: Diagnosis not present

## 2015-02-28 DIAGNOSIS — E118 Type 2 diabetes mellitus with unspecified complications: Secondary | ICD-10-CM

## 2015-02-28 LAB — POCT CBC
Granulocyte percent: 83.8 %G — AB (ref 37–80)
HCT, POC: 47.6 % (ref 43.5–53.7)
Hemoglobin: 15.3 g/dL (ref 14.1–18.1)
Lymph, poc: 2 (ref 0.6–3.4)
MCH, POC: 28.1 pg (ref 27–31.2)
MCHC: 32.1 g/dL (ref 31.8–35.4)
MCV: 87.5 fL (ref 80–97)
MID (cbc): 0.2 (ref 0–0.9)
MPV: 9.3 fL (ref 0–99.8)
POC Granulocyte: 11.1 — AB (ref 2–6.9)
POC LYMPH PERCENT: 14.8 %L (ref 10–50)
POC MID %: 1.4 %M (ref 0–12)
Platelet Count, POC: 188 10*3/uL (ref 142–424)
RBC: 5.44 M/uL (ref 4.69–6.13)
RDW, POC: 12.9 %
WBC: 13.3 10*3/uL — AB (ref 4.6–10.2)

## 2015-02-28 LAB — GLUCOSE, POCT (MANUAL RESULT ENTRY): POC Glucose: 309 mg/dl — AB (ref 70–99)

## 2015-02-28 MED ORDER — BLOOD GLUCOSE METER KIT: PACK | Status: DC

## 2015-02-28 MED ORDER — CEFTRIAXONE SODIUM 1 G IJ SOLR
1.0000 g | Freq: Once | INTRAMUSCULAR | Status: AC
  Administered 2015-02-28: 1 g via INTRAMUSCULAR

## 2015-02-28 NOTE — Patient Instructions (Addendum)
Check blood sugar before breakfast fasting and about 2 hours after the main meal several days a week. Today record of it.  GU the doxycycline twice daily  In the event of her acutely getting worse at anytime go to the emergency room  If not a lot better by tomorrow come back for a recheck by Kem Boroughs PA  Read the American Diabetic Association website

## 2015-02-28 NOTE — Progress Notes (Signed)
Patient ID: Steven Hodge, male    DOB: 1960-07-11  Age: 54 y.o. MRN: [id number]  Chief Complaint  Patient presents with  . Wound Check    celluitis on lower right leg    Subjective:   Patient is subjectively feeling better than he was yesterday. Dr. guess told him not to take his medications for diabetes until after he had his fasting labs done today. He is not taking anything diabetes yet. He feels better. When he stands up his leg hurts. He had one little sweaty episode but he did not feel nearly as bad in children as he did the night before. He has not had his doxycycline this morning, though he took the doxycycline twice yesterday. We talked about metformin and loose bowels. He takes Metamucil anyhow and told him he might not need it. Current allergies, medications, problem list, past/family and social histories reviewed.  Objective:  BP 118/80 mmHg  Pulse 87  Temp(Src) 99.7 F (37.6 C) (Oral)  Resp 16  Ht $R'5\' 8"'IB$  (1.727 m)  Wt 264 lb (119.75 kg)  BMI 40.15 kg/m2  SpO2 98%  Erythematous medial thigh down to the knee, posterior leg above the popliteal fossa, and erythema from about 3 or 4 inches below the knee down to the ankle. The erythema is not as bright red as it was yesterday. No inguinal nodes.  Results for orders placed or performed in visit on 02/28/15  POCT CBC  Result Value Ref Range   WBC 13.3 (A) 4.6 - 10.2 K/uL   Lymph, poc 2.0 0.6 - 3.4   POC LYMPH PERCENT 14.8 10 - 50 %L   MID (cbc) 0.2 0 - 0.9   POC MID % 1.4 0 - 12 %M   POC Granulocyte 11.1 (A) 2 - 6.9   Granulocyte percent 83.8 (A) 37 - 80 %G   RBC 5.44 4.69 - 6.13 M/uL   Hemoglobin 15.3 14.1 - 18.1 g/dL   HCT, POC 47.6 43.5 - 53.7 %   MCV 87.5 80 - 97 fL   MCH, POC 28.1 27 - 31.2 pg   MCHC 32.1 31.8 - 35.4 g/dL   RDW, POC 12.9 %   Platelet Count, POC 188.0 142 - 424 K/uL   MPV 9.3 0 - 99.8 fL  POCT glucose (manual entry)  Result Value Ref Range   POC Glucose 309 (A) 70 - 99 mg/dl    Assessment &  Plan:   Assessment: 1. Cellulitis of right lower extremity   2. Type 2 diabetes mellitus with complication       Plan: Orders Placed This Encounter  Procedures  . POCT CBC  . POCT glucose (manual entry)    Meds ordered this encounter  Medications  . cefTRIAXone (ROCEPHIN) injection 1 g    Sig:     Order Specific Question:  Antibiotic Indication:    Answer:  Cellulitis  . blood glucose meter kit and supplies    Sig: Dispense based on patient and insurance preference. Use up to four times daily as directed. (FOR ICD-9 250.00, 250.01).    Dispense:  1 each    Refill:  0    Order Specific Question:  Number of strips    Answer:  100    Order Specific Question:  Number of lancets    Answer:  100     There is some subjective improvement and labs are about plateaued. Will treat with ceftriaxone 1 g. Continue his other medications. Prescribed him with a  glucose meter. Return tomorrow and less substantially better   Patient Instructions  Check blood sugar before breakfast fasting and about 2 hours after the main meal several days a week. Today record of it.  GU the doxycycline twice daily  In the event of her acutely getting worse at anytime go to the emergency room  If not a lot better by tomorrow come back for a recheck by Kem Boroughs PA  Read the American Diabetic Association website     Return in about 1 day (around 03/01/2015).   HOPPER,DAVID, MD 02/28/2015

## 2015-03-01 ENCOUNTER — Ambulatory Visit (INDEPENDENT_AMBULATORY_CARE_PROVIDER_SITE_OTHER): Payer: BLUE CROSS/BLUE SHIELD | Admitting: Physician Assistant

## 2015-03-01 VITALS — BP 140/86 | HR 76 | Temp 97.6°F | Resp 18 | Ht 68.0 in | Wt 266.2 lb

## 2015-03-01 DIAGNOSIS — E118 Type 2 diabetes mellitus with unspecified complications: Secondary | ICD-10-CM | POA: Diagnosis not present

## 2015-03-01 DIAGNOSIS — L03115 Cellulitis of right lower limb: Secondary | ICD-10-CM

## 2015-03-01 LAB — POCT CBC
Granulocyte percent: 77 %G (ref 37–80)
HCT, POC: 46 % (ref 43.5–53.7)
Hemoglobin: 14.8 g/dL (ref 14.1–18.1)
Lymph, poc: 2.3 (ref 0.6–3.4)
MCH, POC: 28 pg (ref 27–31.2)
MCHC: 32.2 g/dL (ref 31.8–35.4)
MCV: 86.9 fL (ref 80–97)
MID (cbc): 0.5 (ref 0–0.9)
MPV: 9.5 fL (ref 0–99.8)
POC Granulocyte: 9.2 — AB (ref 2–6.9)
POC LYMPH PERCENT: 19.2 %L (ref 10–50)
POC MID %: 3.8 %M (ref 0–12)
Platelet Count, POC: 206 10*3/uL (ref 142–424)
RBC: 5.29 M/uL (ref 4.69–6.13)
RDW, POC: 12.7 %
WBC: 12 10*3/uL — AB (ref 4.6–10.2)

## 2015-03-01 LAB — GLUCOSE, POCT (MANUAL RESULT ENTRY): POC Glucose: 284 mg/dl — AB (ref 70–99)

## 2015-03-01 MED ORDER — CEFTRIAXONE SODIUM 1 G IJ SOLR
1.0000 g | Freq: Once | INTRAMUSCULAR | Status: AC
  Administered 2015-03-01: 1 g via INTRAMUSCULAR

## 2015-03-01 NOTE — Progress Notes (Signed)
Subjective:    Patient ID: Steven Hodge, male    DOB: Oct 12, 1960, 54 y.o.   MRN: [id number]  HPI Patient presents for cellulitis follow up diagnosed 2 days ago and being treated with Rocephin IM x2 and doxycycline po. States that he feel much better today and leg is not in as much pain. Area of redness shrinking as it is no longer behind and around knee. Denies fever and leg is not wheeping. Swelling is unchanged. Had chills when woke up this morning, but no fever.   Started metformin and glipizide yesterday and states that he is doing well on both without side effects. Will be able to pick up glucometer today. States that wife is pre-diabetic so doesn't "eat crazy" already.   Review of Systems  Constitutional: Positive for chills. Negative for fever and diaphoresis.  Cardiovascular: Positive for leg swelling.  Gastrointestinal: Negative for nausea, vomiting and diarrhea.  Skin: Positive for color change. Negative for wound.  Neurological: Negative for dizziness and headaches.       Objective:   Physical Exam  Constitutional: He is oriented to person, place, and time. He appears well-developed and well-nourished. No distress.  Blood pressure 140/86, pulse 76, temperature 97.6 F (36.4 C), temperature source Oral, resp. rate 18, height 5\' 8"  (1.727 m), weight 266 lb 3.2 oz (120.748 kg), SpO2 98 %.   HENT:  Head: Normocephalic and atraumatic.  Right Ear: External ear normal.  Left Ear: External ear normal.  Eyes: Conjunctivae are normal. Right eye exhibits no discharge. Left eye exhibits no discharge. No scleral icterus.  Pulmonary/Chest: Effort normal.  Neurological: He is alert and oriented to person, place, and time.  Skin: Skin is warm and dry. He is not diaphoretic. There is erythema (Area of erythema receding and mostly affecting calf and shin. Knee no longer affected.).  Psychiatric: He has a normal mood and affect. His behavior is normal. Judgment and thought content normal.    Results for orders placed or performed in visit on 03/01/15  POCT CBC  Result Value Ref Range   WBC 12.0 (A) 4.6 - 10.2 K/uL   Lymph, poc 2.3 0.6 - 3.4   POC LYMPH PERCENT 19.2 10 - 50 %L   MID (cbc) 0.5 0 - 0.9   POC MID % 3.8 0 - 12 %M   POC Granulocyte 9.2 (A) 2 - 6.9   Granulocyte percent 77.0 37 - 80 %G   RBC 5.29 4.69 - 6.13 M/uL   Hemoglobin 14.8 14.1 - 18.1 g/dL   HCT, POC 46.0 43.5 - 53.7 %   MCV 86.9 80 - 97 fL   MCH, POC 28.0 27 - 31.2 pg   MCHC 32.2 31.8 - 35.4 g/dL   RDW, POC 12.7 %   Platelet Count, POC 206 142 - 424 K/uL   MPV 9.5 0 - 99.8 fL  POCT glucose (manual entry)  Result Value Ref Range   POC Glucose 284 (A) 70 - 99 mg/dl   CBC Latest Ref Rng 03/01/2015 02/28/2015 02/27/2015  WBC 4.6 - 10.2 K/uL 12.0(A) 13.3(A) 13.4(A)  Hemoglobin 14.1 - 18.1 g/dL 14.8 15.3 15.5  Hematocrit 43.5 - 53.7 % 46.0 47.6 47.0       Assessment & Plan:  1. Cellulitis of right lower extremity Leg looking better and coupled with improving labs and patient feeling better, infection is improving. Continued follow up is not necessary at this time. Patient's wife concerned about being able to travel in the next 5  days. Warning signs discussed, however, if no improvement or worsening should RTC. She states that they will come in on the Sunday before they travel to have leg checked. - POCT CBC - cefTRIAXone (ROCEPHIN) injection 1 g; Inject 1 g into the muscle once.  2. Type 2 diabetes mellitus with complication Continue metformin and glipizide. Should monitor fasting glucose. Non-fasting numbers have improved some from 356 tp 284 today.  - POCT glucose (manual entry)   Alveta Heimlich PA-C  [facility] 03/01/2015 12:49 PM

## 2015-03-04 ENCOUNTER — Ambulatory Visit (INDEPENDENT_AMBULATORY_CARE_PROVIDER_SITE_OTHER): Payer: BLUE CROSS/BLUE SHIELD | Admitting: Internal Medicine

## 2015-03-04 VITALS — BP 124/80 | HR 85 | Temp 98.6°F | Resp 17 | Ht 69.5 in | Wt 265.0 lb

## 2015-03-04 DIAGNOSIS — L03115 Cellulitis of right lower limb: Secondary | ICD-10-CM | POA: Diagnosis not present

## 2015-03-04 DIAGNOSIS — E11628 Type 2 diabetes mellitus with other skin complications: Secondary | ICD-10-CM | POA: Diagnosis not present

## 2015-03-04 LAB — POCT CBC
Granulocyte percent: 68.5 %G (ref 37–80)
HCT, POC: 47.7 % (ref 43.5–53.7)
Hemoglobin: 15 g/dL (ref 14.1–18.1)
Lymph, poc: 2.4 (ref 0.6–3.4)
MCH, POC: 27.6 pg (ref 27–31.2)
MCHC: 31.4 g/dL — AB (ref 31.8–35.4)
MCV: 87.9 fL (ref 80–97)
MID (cbc): 0.4 (ref 0–0.9)
MPV: 8.6 fL (ref 0–99.8)
POC Granulocyte: 6 (ref 2–6.9)
POC LYMPH PERCENT: 27.3 %L (ref 10–50)
POC MID %: 4.2 %M (ref 0–12)
Platelet Count, POC: 332 10*3/uL (ref 142–424)
RBC: 5.42 M/uL (ref 4.69–6.13)
RDW, POC: 12.7 %
WBC: 8.8 10*3/uL (ref 4.6–10.2)

## 2015-03-04 LAB — GLUCOSE, POCT (MANUAL RESULT ENTRY): POC Glucose: 135 mg/dl — AB (ref 70–99)

## 2015-03-04 MED ORDER — CEFTRIAXONE SODIUM 1 G IJ SOLR
1.0000 g | Freq: Once | INTRAMUSCULAR | Status: AC
  Administered 2015-03-04: 1 g via INTRAMUSCULAR

## 2015-03-04 MED ORDER — CEFTRIAXONE SODIUM 1 G IJ SOLR: 1.0000 g | Freq: Once | INTRAMUSCULAR | Status: DC

## 2015-03-04 NOTE — Progress Notes (Signed)
Patient ID: Steven Hodge, male   DOB: [birth year], 54 y.o.   MRN: [id number]   03/04/2015 at 1:27 PM  Steven Hodge / DOB: 08-04-1960 / MRN: [id number]  Problem list reviewed and updated by me where necessary.   SUBJECTIVE  Steven Hodge is a 54 y.o. ill appearing male presenting for the chief complaint of pain, swelling and infection of right lower leg. He was dxed with cellulitis and new onset T2DM last week. See each visit. He feels the rocephin injections are improving the infection. He has 2 red tender masses one posterior above knee and one below..     He  has a past medical history of Diabetes mellitus without complication.    Medications reviewed and updated by myself where necessary, and exist elsewhere in the encounter.   [name] has No Known Allergies. He  reports that he has quit smoking. He does not have any smokeless tobacco history on file. He reports that he does not drink alcohol or use illicit drugs. He  reports that he currently engages in sexual activity. The patient  has past surgical history that includes Knee surgery (2005); basel cell cancer removed (2009); and Joint replacement.  His family history includes Cancer in his brother, father, mother, and sister; Heart disease in his brother.  Review of Systems  Constitutional: Negative for fever.  Respiratory: Negative for shortness of breath.   Cardiovascular: Negative for chest pain.  Gastrointestinal: Negative for nausea.  Skin: Negative for rash.  Neurological: Negative for dizziness and headaches.    OBJECTIVE  His  height is 5' 9.5" (1.765 m) and weight is 265 lb (120.203 kg). His oral temperature is 98.6 F (37 C). His blood pressure is 124/80 and his pulse is 85. His respiration is 17 and oxygen saturation is 98%.  The patient's body mass index is 38.59 kg/(m^2).  Physical Exam  Constitutional: He is oriented to person, place, and time. He appears well-developed and well-nourished. He appears distressed.   HENT:  Head: Normocephalic.  Nose: Nose normal.  Eyes: Conjunctivae and EOM are normal. Pupils are equal, round, and reactive to light.  Neck: Normal range of motion.  Cardiovascular: Normal rate.   Respiratory: Effort normal.  Musculoskeletal: He exhibits edema and tenderness.  Neurological: He is alert and oriented to person, place, and time. He exhibits normal muscle tone. Coordination normal.  Skin: Skin is warm. Lesion and rash noted. Rash is maculopapular. Rash is not vesicular. There is erythema.     2 indurated masses  He states both getting smaller.  Psychiatric: He has a normal mood and affect. His behavior is normal.    Results for orders placed or performed in visit on 03/04/15 (from the past 24 hour(s))  POCT CBC     Status: Abnormal   Collection Time: 03/04/15  1:10 PM  Result Value Ref Range   WBC 8.8 4.6 - 10.2 K/uL   Lymph, poc 2.4 0.6 - 3.4   POC LYMPH PERCENT 27.3 10 - 50 %L   MID (cbc) 0.4 0 - 0.9   POC MID % 4.2 0 - 12 %M   POC Granulocyte 6.0 2 - 6.9   Granulocyte percent 68.5 37 - 80 %G   RBC 5.42 4.69 - 6.13 M/uL   Hemoglobin 15.0 14.1 - 18.1 g/dL   HCT, POC 47.7 43.5 - 53.7 %   MCV 87.9 80 - 97 fL   MCH, POC 27.6 27 - 31.2 pg   MCHC 31.4 (  A) 31.8 - 35.4 g/dL   RDW, POC 12.7 %   Platelet Count, POC 332 142 - 424 K/uL   MPV 8.6 0 - 99.8 fL  POCT glucose (manual entry)     Status: Abnormal   Collection Time: 03/04/15  1:10 PM  Result Value Ref Range   POC Glucose 135 (A) 70 - 99 mg/dl   Will reduce dose of glipizide. ASSESSMENT & PLAN  Steven Hodge was seen today for follow-up.  Diagnoses and all orders for this visit:  Type 2 diabetes mellitus with other skin complications -     POCT CBC -     POCT glucose (manual entry) -     cefTRIAXone (ROCEPHIN) 1 G injection; Inject 1 g into the muscle once. -     Ambulatory referral to General Surgery  Cellulitis of right lower extremity -     POCT CBC -     POCT glucose (manual entry) -      cefTRIAXone (ROCEPHIN) 1 G injection; Inject 1 g into the muscle once. -     Ambulatory referral to General Surgery   Continue metformin BID Reduce glipizide 5mg  qam, only take glipizide in evening if glucose over 200. See primary care here to continue diabetes care Refer to surgery consider ID leg

## 2015-03-05 ENCOUNTER — Telehealth: Payer: Self-pay | Admitting: Family Medicine

## 2015-03-05 ENCOUNTER — Ambulatory Visit (INDEPENDENT_AMBULATORY_CARE_PROVIDER_SITE_OTHER): Payer: BLUE CROSS/BLUE SHIELD | Admitting: Internal Medicine

## 2015-03-05 ENCOUNTER — Ambulatory Visit: Payer: BLUE CROSS/BLUE SHIELD

## 2015-03-05 VITALS — BP 120/88 | HR 83 | Temp 97.9°F | Resp 16 | Ht 68.0 in | Wt 259.4 lb

## 2015-03-05 DIAGNOSIS — L02419 Cutaneous abscess of limb, unspecified: Secondary | ICD-10-CM | POA: Diagnosis not present

## 2015-03-05 DIAGNOSIS — L03119 Cellulitis of unspecified part of limb: Secondary | ICD-10-CM | POA: Diagnosis not present

## 2015-03-05 MED ORDER — CEFTRIAXONE SODIUM 1 G IJ SOLR: 1.0000 g | Freq: Once | INTRAMUSCULAR | Status: DC

## 2015-03-05 MED ORDER — CEFTRIAXONE SODIUM 1 G IJ SOLR
1.0000 g | Freq: Once | INTRAMUSCULAR | Status: AC
  Administered 2015-03-05: 1 g via INTRAMUSCULAR

## 2015-03-05 NOTE — Patient Instructions (Addendum)
You have an appt today with Dr. Rosendo Gros @ [facility], [place], [place] [address] [phone]. Please arrive at 2:45 pm.

## 2015-03-05 NOTE — Telephone Encounter (Signed)
Patient spouse, Kennyth Lose, called regarding surgeon recommendation he seen today. Went to [facility] and was seen by Dr. Rosendo Gros, his assessment and plan was no abcess to be drained. 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 Recommended continue oral abx as well as any Rocephin injections as per primary care.   When does he need to come back for recheck?  Can a future order be placed for him to come in for Rocephin injections? If so, when does he need to return for next injection? Duration?  Also wanted to know an "approximate" date he might could return to work? He knows he will be out this week and would just like to know how much longer after this week it might be. Please advise Dr. Rosendo Gros note placed in Dr. Elder Cyphers box

## 2015-03-05 NOTE — Progress Notes (Signed)
Patient ID: Steven Hodge, male   DOB: [birth year], 54 y.o.   MRN: [id number]   03/05/2015 at 9:08 AM  Steven Hodge / DOB: 03/23/1961 / MRN: [id number]  Problem list reviewed and updated by me where necessary.   SUBJECTIVE  Steven Hodge is a 54 y.o. ill appearing male presenting for the chief complaint of cellulitis and pain and swelling of leg. See previous w/ups..     He  has a past medical history of Diabetes mellitus without complication.    Medications reviewed and updated by myself where necessary, and exist elsewhere in the encounter.   Steven Hodge has No Known Allergies. He  reports that he has quit smoking. He does not have any smokeless tobacco history on file. He reports that he does not drink alcohol or use illicit drugs. He  reports that he currently engages in sexual activity. The patient  has past surgical history that includes Knee surgery (2005); basel cell cancer removed (2009); and Joint replacement.  His family history includes Cancer in his brother, father, mother, and sister; Heart disease in his brother.  ROS  OBJECTIVE  His  height is 5\' 8"  (1.727 m) and weight is 259 lb 6.4 oz (117.663 kg). His oral temperature is 97.9 F (36.6 C). His blood pressure is 120/88 and his pulse is 83. His respiration is 16 and oxygen saturation is 98%.  The patient's body mass index is 39.45 kg/(m^2).  Physical Exam  Vitals reviewed. Constitutional: He is oriented to person, place, and time. He appears well-developed and well-nourished. No distress.  HENT:  Head: Normocephalic.  Nose: Nose normal.  Eyes: Conjunctivae and EOM are normal.  Cardiovascular: Normal rate.   Respiratory: Effort normal.  Musculoskeletal: He exhibits edema and tenderness.  Neurological: He is alert and oriented to person, place, and time. He exhibits normal muscle tone. Coordination normal.  Skin: Skin is intact. Lesion and rash noted. Rash is maculopapular. There is erythema.     Psychiatric: He has  a normal mood and affect.    Results for orders placed or performed in visit on 03/04/15 (from the past 24 hour(s))  POCT CBC     Status: Abnormal   Collection Time: 03/04/15  1:10 PM  Result Value Ref Range   WBC 8.8 4.6 - 10.2 K/uL   Lymph, poc 2.4 0.6 - 3.4   POC LYMPH PERCENT 27.3 10 - 50 %L   MID (cbc) 0.4 0 - 0.9   POC MID % 4.2 0 - 12 %M   POC Granulocyte 6.0 2 - 6.9   Granulocyte percent 68.5 37 - 80 %G   RBC 5.42 4.69 - 6.13 M/uL   Hemoglobin 15.0 14.1 - 18.1 g/dL   HCT, POC 47.7 43.5 - 53.7 %   MCV 87.9 80 - 97 fL   MCH, POC 27.6 27 - 31.2 pg   MCHC 31.4 (A) 31.8 - 35.4 g/dL   RDW, POC 12.7 %   Platelet Count, POC 332 142 - 424 K/uL   MPV 8.6 0 - 99.8 fL  POCT glucose (manual entry)     Status: Abnormal   Collection Time: 03/04/15  1:10 PM  Result Value Ref Range   POC Glucose 135 (A) 70 - 99 mg/dl    ASSESSMENT & PLAN  Steven Hodge was seen today for follow-up and cellulitis right lower extremity.  Diagnoses and all orders for this visit:  Cellulitis and abscess of leg -     cefTRIAXone (ROCEPHIN) 1  G injection; Inject 1 g into the muscle once.  Has appt with surgery today

## 2015-03-06 NOTE — Telephone Encounter (Signed)
Left message to return call 

## 2015-03-06 NOTE — Telephone Encounter (Signed)
Return tomorrow and see MD/PA to evaluate and discuss further treatment.

## 2015-03-06 NOTE — Telephone Encounter (Signed)
Steven Hodge, spouse, returned call, notified and voiced understanding. He will come in tomorrow and see Dr. Linna Darner, Dr. Elder Cyphers is not in office tomorrow, Dr. Linna Darner has seen him and evaluated this recently. Please fast track for Dr. Linna Darner tomorrow, Wednesday 03/07/2015.

## 2015-03-07 ENCOUNTER — Ambulatory Visit (INDEPENDENT_AMBULATORY_CARE_PROVIDER_SITE_OTHER): Payer: BLUE CROSS/BLUE SHIELD | Admitting: Family Medicine

## 2015-03-07 VITALS — BP 144/86 | HR 87 | Temp 98.4°F | Resp 16 | Ht 68.0 in | Wt 257.0 lb

## 2015-03-07 DIAGNOSIS — M7989 Other specified soft tissue disorders: Secondary | ICD-10-CM

## 2015-03-07 DIAGNOSIS — T148XXA Other injury of unspecified body region, initial encounter: Secondary | ICD-10-CM

## 2015-03-07 NOTE — Patient Instructions (Addendum)
Return to work next week  Try to keep your legs moving frequently.  When seated for long times try to prop your leg up  Recommend wearing support hose when you are going to be standing a long time.  Consider looking at the family medicine appointments here at the [facility], [facility], and Dr. Obie Dredge practice.  Return as needed. If the leg is looking at all worse at anytime, back by

## 2015-03-09 ENCOUNTER — Ambulatory Visit (INDEPENDENT_AMBULATORY_CARE_PROVIDER_SITE_OTHER): Payer: BLUE CROSS/BLUE SHIELD | Admitting: Family Medicine

## 2015-03-09 ENCOUNTER — Other Ambulatory Visit: Payer: Self-pay | Admitting: Family Medicine

## 2015-03-09 ENCOUNTER — Encounter: Payer: Self-pay | Admitting: Family Medicine

## 2015-03-09 VITALS — BP 135/82 | HR 88 | Temp 98.6°F | Resp 16 | Ht 68.25 in | Wt 255.8 lb

## 2015-03-09 DIAGNOSIS — E118 Type 2 diabetes mellitus with unspecified complications: Secondary | ICD-10-CM

## 2015-03-09 DIAGNOSIS — Z23 Encounter for immunization: Secondary | ICD-10-CM | POA: Diagnosis not present

## 2015-03-09 DIAGNOSIS — L03115 Cellulitis of right lower limb: Secondary | ICD-10-CM | POA: Diagnosis not present

## 2015-03-09 LAB — CBC WITH DIFFERENTIAL/PLATELET
Basophils Absolute: 0 10*3/uL (ref 0.0–0.1)
Basophils Relative: 0 % (ref 0–1)
Eosinophils Absolute: 0.1 10*3/uL (ref 0.0–0.7)
Eosinophils Relative: 1 % (ref 0–5)
HCT: 43.7 % (ref 39.0–52.0)
Hemoglobin: 16.1 g/dL (ref 13.0–17.0)
Lymphocytes Relative: 32 % (ref 12–46)
Lymphs Abs: 3.6 10*3/uL (ref 0.7–4.0)
MCH: 31.4 pg (ref 26.0–34.0)
MCHC: 36.8 g/dL — ABNORMAL HIGH (ref 30.0–36.0)
MCV: 85.4 fL (ref 78.0–100.0)
MPV: 10.8 fL (ref 8.6–12.4)
Monocytes Absolute: 0.7 10*3/uL (ref 0.1–1.0)
Monocytes Relative: 6 % (ref 3–12)
Neutro Abs: 7 10*3/uL (ref 1.7–7.7)
Neutrophils Relative %: 61 % (ref 43–77)
Platelets: 348 10*3/uL (ref 150–400)
RBC: 5.12 MIL/uL (ref 4.22–5.81)
RDW: 13.1 % (ref 11.5–15.5)
WBC: 11.4 10*3/uL — ABNORMAL HIGH (ref 4.0–10.5)

## 2015-03-09 LAB — COMPREHENSIVE METABOLIC PANEL
ALT: 60 U/L — ABNORMAL HIGH (ref 9–46)
AST: 32 U/L (ref 10–35)
Albumin: 4.2 g/dL (ref 3.6–5.1)
Alkaline Phosphatase: 70 U/L (ref 40–115)
BUN: 16 mg/dL (ref 7–25)
CO2: 24 mmol/L (ref 20–31)
Calcium: 9.6 mg/dL (ref 8.6–10.3)
Chloride: 104 mmol/L (ref 98–110)
Creat: 0.78 mg/dL (ref 0.70–1.33)
Glucose, Bld: 124 mg/dL — ABNORMAL HIGH (ref 65–99)
Potassium: 4.6 mmol/L (ref 3.5–5.3)
Sodium: 139 mmol/L (ref 135–146)
Total Bilirubin: 0.4 mg/dL (ref 0.2–1.2)
Total Protein: 7.4 g/dL (ref 6.1–8.1)

## 2015-03-09 LAB — POCT URINALYSIS DIP (MANUAL ENTRY)
Bilirubin, UA: NEGATIVE
Blood, UA: NEGATIVE
Glucose, UA: NEGATIVE
Ketones, POC UA: NEGATIVE
Leukocytes, UA: NEGATIVE
Nitrite, UA: NEGATIVE
Protein Ur, POC: NEGATIVE
Spec Grav, UA: 1.025
Urobilinogen, UA: 0.2
pH, UA: 5

## 2015-03-09 LAB — LIPID PANEL
Cholesterol: 176 mg/dL (ref 125–200)
HDL: 37 mg/dL — ABNORMAL LOW (ref >=40)
LDL Cholesterol: 90 mg/dL (ref <130)
Total CHOL/HDL Ratio: 4.8 Ratio (ref <=5.0)
Triglycerides: 245 mg/dL — ABNORMAL HIGH (ref <150)
VLDL: 49 mg/dL — ABNORMAL HIGH (ref <30)

## 2015-03-09 LAB — GLUCOSE, POCT (MANUAL RESULT ENTRY): POC Glucose: 116 mg/dl — AB (ref 70–99)

## 2015-03-09 NOTE — Progress Notes (Signed)
Subjective:    Patient ID: Steven Hodge, male    DOB: [birth year], 54 y.o.   MRN: [id number]  03/09/2015  Establish Care and Cellulitis   HPI This 54 y.o. male presents to establish care and for 72 hour follow-up:  1.  R lower extremity cellulitis: diagnosed with cellulitis by Dr. Linna Darner on 02/27/15. Treated with Ceftriaxone and doxycycline.  Followed closely.  Last fever several days ago; bad night sweats; last night sweats 72 hours ago.  RLE doppler negative for DVT. Developed a hardness along R proximal leg after the doppler; referred to general surgeon due to worsening induration; no I&D indicated.  Felt secondary to infection.  Swelling has continued to decrease.    2.  DMII:  HgbA1c 12.0 last week; new onset diabetes.  Glucometer at home; brought to visit; this morning 190; last night 125.  Metformin 587m two morning, 1 Glipizide; at night 1 Metformin 5048mand takes Glipizide if sugar > 200.   Mild nausea with diarrhea; bowel movements have changed; stomach cramps; less frequent b.m.     Review of Systems  Constitutional: Negative for fever, chills, diaphoresis, activity change, appetite change and fatigue.  Respiratory: Negative for cough and shortness of breath.   Cardiovascular: Negative for chest pain, palpitations and leg swelling.  Gastrointestinal: Negative for nausea, vomiting, abdominal pain and diarrhea.  Endocrine: Negative for cold intolerance, heat intolerance, polydipsia, polyphagia and polyuria.  Skin: Negative for color change, rash and wound.  Neurological: Negative for dizziness, tremors, seizures, syncope, facial asymmetry, speech difficulty, weakness, light-headedness, numbness and headaches.  Psychiatric/Behavioral: Negative for sleep disturbance and dysphoric mood. The patient is not nervous/anxious.     Past Medical History  Diagnosis Date  . Diabetes mellitus without complication   . Cancer 06/09/2006    Basal cell carcinoma scalp   Past Surgical  History  Procedure Laterality Date  . Knee surgery  2005    ACL repair R  . Basel cell cancer removed  2009   No Known Allergies Current Outpatient Prescriptions  Medication Sig Dispense Refill  . aspirin 325 MG tablet Take 325 mg by mouth daily.    . Marland Kitchenoxycycline (VIBRA-TABS) 100 MG tablet Take 1 tablet (100 mg total) by mouth 2 (two) times daily. 28 tablet 1  . fish oil-omega-3 fatty acids 1000 MG capsule Take 2 g by mouth daily.    . Marland KitchenlipiZIDE (GLUCOTROL) 5 MG tablet Take 1 tablet (5 mg total) by mouth 2 (two) times daily before a meal. 60 tablet 3  . glucosamine-chondroitin 500-400 MG tablet Take 1 tablet by mouth once.    . metFORMIN (GLUCOPHAGE) 500 MG tablet Take 1 tablet (500 mg total) by mouth 2 (two) times daily with a meal. 180 tablet 3  . Multiple Vitamin (MULTIVITAMIN) tablet Take 1 tablet by mouth daily.    . TURMERIC PO Take 1,000 mg by mouth 2 (two) times daily.    . blood glucose meter kit and supplies Dispense based on patient and insurance preference. Use up to four times daily as directed. (FOR ICD-9 250.00, 250.01). 1 each 0   No current facility-administered medications for this visit.   Social History   Social History  . Marital Status: Married    Spouse Name: N/A  . Number of Children: N/A  . Years of Education: N/A   Occupational History  . Not on file.   Social History Main Topics  . Smoking status: Former SmResearch scientist (life sciences). Smokeless tobacco: Not on file  .  Alcohol Use: No  . Drug Use: No  . Sexual Activity: Yes   Other Topics Concern  . 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 Diabetes Mother   . Cancer Father 23    lung cancer    . Diabetes Father   . Cancer Sister 45    lung cancer  . Heart disease Brother 13    cardiac stenting/CAD  . Cancer Brother     skin cancer       Objective:    BP 135/82 mmHg  Pulse 88  Temp(Src) 98.6 F (37 C) (Oral)  Resp 16  Ht 5' 8.25" (1.734 m)  Wt 255 lb 12.8 oz (116.03 kg)  BMI 38.59 kg/m2 Physical Exam  Constitutional: He is oriented to person, place, and time. He appears well-developed and well-nourished. No distress.  HENT:  Head: Normocephalic and atraumatic.  Right Ear: External ear normal.  Left Ear: External ear normal.  Nose: Nose normal.  Mouth/Throat: Oropharynx is clear and moist.  Eyes: Conjunctivae and EOM are normal. Pupils are equal, round, and reactive to light.  Neck: Normal range of motion. Neck supple. Carotid bruit is not present. No thyromegaly present.  Cardiovascular: Normal rate, regular rhythm, normal heart sounds and intact distal pulses.  Exam reveals no gallop and no friction rub.   No murmur heard. Hommen's negative.  Pulmonary/Chest: Effort normal and breath sounds normal. He has no wheezes. He has no rales.  Abdominal: Soft. Bowel sounds are normal. He exhibits no distension and no mass. There is no tenderness. There is no rebound and no guarding.  Lymphadenopathy:    He has no cervical adenopathy.  Neurological: He is alert and oriented to person, place, and time. No cranial nerve deficit.  Skin: Skin is warm and dry. No rash noted. He is not diaphoretic. There is erythema.  R lower extremity calf with minimal erythema and no swelling or induration; +medial proximal leg with induration 4cm x 15cm non-tender without erythema.    Psychiatric: He has a normal mood and affect. His behavior is normal.  Nursing note and vitals reviewed.  INFLUENZA  AND PNEUMOVAX VACCINE ADMINISTERED.     Assessment & Plan:   1. Type 2 diabetes mellitus with complication   2. Cellulitis of right lower extremity   3. Need for prophylactic vaccination  and inoculation against influenza   4. Need for prophylactic vaccination against Streptococcus pneumoniae (pneumococcus)     1. DMII with skin complications and uncontrolled; new onset/newly diagnosed.  Refer for diabetic education; increase Glipizide to bid scheduled.  Obtain labs.  S/p Pneumovax and flu vaccines.  Follow-up in three months.  Extensive counseling provided on diagnosis, potential complications if poorly controlled long-term, and dietary recommendations during visit.  Prolonged face to face for 30 minutes with 50% of time dedicated to nutrition counseling. 2.  Cellulitis RLE: improved; persistent proximal R leg induration; s/p lower extremity doppler that was negative  for DVT; s/p general surgery consultation and no surgical I&D recommended.  Pt refused repeat doppler today.  Complete abx therapy.   3.  S/p Pneumovax and influenza vaccines today.  Orders Placed This Encounter  Procedures  . Flu Vaccine QUAD 36+ mos IM  . Pneumococcal polysaccharide vaccine 23-valent greater than or equal to 2yo subcutaneous/IM  . Microalbumin, urine  . CBC with Differential/Platelet  . Comprehensive metabolic panel    Order Specific Question:  Has the patient fasted?    Answer:  Yes  . Lipid panel    Order Specific Question:  Has the patient fasted?    Answer:  Yes  . TSH  . Ambulatory referral to diabetic education    Referral Priority:  Routine    Referral Type:  Consultation    Referral Reason:  Specialty Services Required    Number of Visits Requested:  1  . POCT urinalysis dipstick  . POCT glucose (manual entry)   No orders of the defined types were placed in this encounter.    Return in about 3 months (around 06/08/2015).   Kristi Elayne Guerin, M.D. [facility] [address] [place], [place]  [address] [phone] phone [phone] fax

## 2015-03-09 NOTE — Patient Instructions (Signed)

## 2015-03-10 LAB — TSH: TSH: 1.731 u[IU]/mL (ref 0.350–4.500)

## 2015-03-10 LAB — MICROALBUMIN, URINE: Microalb, Ur: 1.8 mg/dL (ref <2.0)

## 2015-03-11 NOTE — Progress Notes (Signed)
Patient ID: HJALMER IOVINO, male    DOB: 1960/12/29  Age: 54 y.o. MRN: [id number]  Chief Complaint  Patient presents with  . Follow-up    abcess of right leg/ Dr. Linna Darner    Subjective:   Here for recheck of leg.  Still has swelling of the leg.  It feels better, still has knot behind right medial theigh.  Current allergies, medications, problem list, past/family and social histories reviewed.  Objective:  BP 144/86 mmHg  Pulse 87  Temp(Src) 98.4 F (36.9 C) (Oral)  Resp 16  Ht 5\' 8"  (1.727 m)  Wt 257 lb (116.574 kg)  BMI 39.09 kg/m2  SpO2 98%  Firm area of swelling in right theigh medially.  Mild discoloration of skin persists, much less inflammed.  Some swelling of leg persists  Assessment & Plan:   Assessment: 1. Leg swelling   2. Hematoma       Plan: Same care. Elevate .  Return if needed.  No orders of the defined types were placed in this encounter.    No orders of the defined types were placed in this encounter.         Patient Instructions  Return to work next week  Try to keep your legs moving frequently.  When seated for long times try to prop your leg up  Recommend wearing support hose when you are going to be standing a long time.  Consider looking at the family medicine appointments here at the [facility], [facility], and Dr. Obie Dredge practice.  Return as needed. If the leg is looking at all worse at anytime, back by     Return if symptoms worsen or fail to improve.   Aubry Tucholski, MD 03/11/2015

## 2015-03-13 ENCOUNTER — Encounter: Payer: Self-pay | Admitting: Emergency Medicine

## 2015-03-14 LAB — HEPATITIS PANEL, ACUTE
HCV Ab: NEGATIVE
Hep A IgM: NONREACTIVE
Hep B C IgM: NONREACTIVE
Hepatitis B Surface Ag: NEGATIVE

## 2015-04-17 ENCOUNTER — Other Ambulatory Visit: Payer: Self-pay | Admitting: Family Medicine

## 2015-04-20 ENCOUNTER — Telehealth: Payer: Self-pay

## 2015-04-20 DIAGNOSIS — M25461 Effusion, right knee: Secondary | ICD-10-CM

## 2015-04-20 DIAGNOSIS — E11628 Type 2 diabetes mellitus with other skin complications: Secondary | ICD-10-CM

## 2015-04-20 DIAGNOSIS — R7309 Other abnormal glucose: Secondary | ICD-10-CM

## 2015-04-20 MED ORDER — METFORMIN HCL 500 MG PO TABS: ORAL_TABLET | ORAL | Status: DC

## 2015-04-20 NOTE — Telephone Encounter (Signed)
Please advise 

## 2015-04-20 NOTE — Telephone Encounter (Signed)
Changed script.

## 2015-04-20 NOTE — Telephone Encounter (Signed)
[facility] called about the patient's Metformin. They need a new script that reflects the following: Metformin 500mg . 2 pills in the morning and 1 in the evening. [phone]  Thanks, Rosanne Sack

## 2015-06-08 ENCOUNTER — Ambulatory Visit: Payer: BLUE CROSS/BLUE SHIELD | Admitting: Family Medicine

## 2015-06-15 ENCOUNTER — Encounter: Payer: Self-pay | Admitting: Family Medicine

## 2015-06-15 ENCOUNTER — Ambulatory Visit (INDEPENDENT_AMBULATORY_CARE_PROVIDER_SITE_OTHER): Payer: BLUE CROSS/BLUE SHIELD | Admitting: Family Medicine

## 2015-06-15 VITALS — BP 123/71 | HR 73 | Temp 98.4°F | Resp 16 | Ht 68.25 in | Wt 259.2 lb

## 2015-06-15 DIAGNOSIS — R7989 Other specified abnormal findings of blood chemistry: Secondary | ICD-10-CM

## 2015-06-15 DIAGNOSIS — Z114 Encounter for screening for human immunodeficiency virus [HIV]: Secondary | ICD-10-CM | POA: Diagnosis not present

## 2015-06-15 DIAGNOSIS — R7309 Other abnormal glucose: Secondary | ICD-10-CM

## 2015-06-15 DIAGNOSIS — M25461 Effusion, right knee: Secondary | ICD-10-CM

## 2015-06-15 DIAGNOSIS — L03115 Cellulitis of right lower limb: Secondary | ICD-10-CM

## 2015-06-15 DIAGNOSIS — K760 Fatty (change of) liver, not elsewhere classified: Secondary | ICD-10-CM

## 2015-06-15 DIAGNOSIS — E669 Obesity, unspecified: Secondary | ICD-10-CM

## 2015-06-15 DIAGNOSIS — E119 Type 2 diabetes mellitus without complications: Secondary | ICD-10-CM

## 2015-06-15 DIAGNOSIS — R945 Abnormal results of liver function studies: Secondary | ICD-10-CM

## 2015-06-15 DIAGNOSIS — E785 Hyperlipidemia, unspecified: Secondary | ICD-10-CM | POA: Diagnosis not present

## 2015-06-15 DIAGNOSIS — E11628 Type 2 diabetes mellitus with other skin complications: Secondary | ICD-10-CM

## 2015-06-15 LAB — COMPREHENSIVE METABOLIC PANEL
ALT: 32 U/L (ref 9–46)
AST: 22 U/L (ref 10–35)
Albumin: 4.3 g/dL (ref 3.6–5.1)
Alkaline Phosphatase: 54 U/L (ref 40–115)
BUN: 16 mg/dL (ref 7–25)
CO2: 26 mmol/L (ref 20–31)
Calcium: 9.6 mg/dL (ref 8.6–10.3)
Chloride: 103 mmol/L (ref 98–110)
Creat: 0.88 mg/dL (ref 0.70–1.33)
Glucose, Bld: 84 mg/dL (ref 65–99)
Potassium: 4.2 mmol/L (ref 3.5–5.3)
Sodium: 141 mmol/L (ref 135–146)
Total Bilirubin: 0.5 mg/dL (ref 0.2–1.2)
Total Protein: 7 g/dL (ref 6.1–8.1)

## 2015-06-15 LAB — GLUCOSE, POCT (MANUAL RESULT ENTRY): POC Glucose: 90 mg/dl (ref 70–99)

## 2015-06-15 LAB — POCT GLYCOSYLATED HEMOGLOBIN (HGB A1C): Hemoglobin A1C: 5.9

## 2015-06-15 MED ORDER — METFORMIN HCL 1000 MG PO TABS: ORAL_TABLET | ORAL | Status: DC

## 2015-06-15 NOTE — Progress Notes (Signed)
Subjective:    Patient ID: Steven Hodge, male    DOB: 1960-08-24, 55 y.o.   MRN: [id number]  06/15/2015  Follow-up   HPI This 55 y.o. male presents for three month follow-up:  1. DMII: Patient reports good compliance with medication, good tolerance to medication, and good symptom control.  Checking sugars:  90 day fasting average 134.  90 day before meal average before supper 111.  After meal 90 day average 137.   Always a little high in the morning. B:  Scrambled eggs, bacon, tangerine, coffee stevia. Snack: 10:00; almonds, water L:  Leftovers (october beans, greens, cornbread, water) Snack: almonds, water Supper: meat, vegetable, starch, water Snack: PB crackers; nighttime snack does not help with fasting sugars.  If eats later at night with an earlier snack, lower in morning. Metformin and Glipizide with supper; also taking with breakfast.   A bit constipated since starting medication.  Two every morning and one at night.  Does not feel better.   Urination has decreased.  Review of Systems  Constitutional: Negative for fever, chills, diaphoresis, activity change, appetite change and fatigue.  Respiratory: Negative for cough and shortness of breath.   Cardiovascular: Negative for chest pain, palpitations and leg swelling.  Gastrointestinal: Negative for nausea, vomiting, abdominal pain and diarrhea.  Endocrine: Negative for cold intolerance, heat intolerance, polydipsia, polyphagia and polyuria.  Skin: Negative for color change, rash and wound.  Neurological: Negative for dizziness, tremors, seizures, syncope, facial asymmetry, speech difficulty, weakness, light-headedness, numbness and headaches.  Psychiatric/Behavioral: Negative for sleep disturbance and dysphoric mood. The patient is not nervous/anxious.     Past Medical History  Diagnosis Date  . Diabetes mellitus without complication ([place])   . Cancer ([facility]) 06/09/2006    Basal cell carcinoma scalp   Past Surgical  History  Procedure Laterality Date  . Knee surgery  2005    ACL repair R  . Basel cell cancer removed  2009   No Known Allergies  Social History   Social History  . Marital Status: Married    Spouse Name: N/A  . Number of Children: N/A  . Years of Education: N/A   Occupational History  . Not on file.   Social History Main Topics  . Smoking status: Former Research scientist (life sciences)  . Smokeless tobacco: Not on file  . Alcohol Use: No  . Drug Use: No  . Sexual Activity: Yes   Other Topics Concern  . 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 Diabetes Mother   . Cancer Father 27    lung cancer  . Diabetes Father   . Cancer Sister 2    lung cancer  . Heart disease Brother 43    cardiac stenting/CAD  . Cancer Brother     skin cancer       Objective:    BP 123/71 mmHg  Pulse 73  Temp(Src) 98.4 F (36.9 C) (Oral)  Resp 16  Ht 5' 8.25" (1.734 m)  Wt 259 lb 3.2 oz (117.572 kg)  BMI 39.10 kg/m2  SpO2 96% Physical Exam  Constitutional: He is oriented to person, place, and time. He appears well-developed and well-nourished. No distress.  HENT:  Head: Normocephalic and atraumatic.  Right Ear: External ear normal.  Left Ear: External ear normal.  Nose: Nose normal.  Mouth/Throat: Oropharynx is clear and moist.  Eyes: Conjunctivae and EOM are normal. Pupils are equal, round, and reactive to light.  Neck: Normal range of motion. Neck supple. Carotid bruit is not present. No thyromegaly present.  Cardiovascular: Normal rate, regular rhythm, normal heart sounds and intact distal pulses.  Exam reveals no gallop and no friction rub.   No murmur  heard. Pulmonary/Chest: Effort normal and breath sounds normal. He has no wheezes. He has no rales.  Abdominal: Soft. Bowel sounds are normal. He exhibits no distension and no mass. There is no tenderness. There is no rebound and no guarding.  Lymphadenopathy:    He has no cervical adenopathy.  Neurological: He is alert and oriented to person, place, and time. No cranial nerve deficit.  Skin: Skin is warm and dry. No rash noted. He is not diaphoretic.  Psychiatric: He has a normal mood and affect. His behavior is normal.  Nursing note and vitals reviewed.  Results for orders placed or performed in visit on 06/15/15  Comprehensive metabolic panel  Result Value Ref Range   Sodium 141 135 - 146 mmol/L   Potassium 4.2 3.5 - 5.3 mmol/L   Chloride 103 98 - 110 mmol/L   CO2 26 20 - 31 mmol/L   Glucose, Bld 84 65 - 99 mg/dL   BUN 16 7 - 25 mg/dL   Creat 0.88 0.70 - 1.33 mg/dL   Total Bilirubin 0.5 0.2 - 1.2 mg/dL   Alkaline Phosphatase 54 40 - 115 U/L   AST 22 10 - 35 U/L   ALT 32 9 - 46 U/L   Total Protein 7.0 6.1 - 8.1 g/dL   Albumin 4.3 3.6 - 5.1 g/dL   Calcium 9.6 8.6 - 10.3 mg/dL  HIV antibody  Result Value Ref Range   HIV 1&2 Ab, 4th Generation NONREACTIVE NONREACTIVE  POCT glucose (manual entry)  Result Value Ref Range   POC Glucose 90 70 - 99 mg/dl  POCT glycosylated hemoglobin (Hb A1C)  Result Value Ref Range   Hemoglobin A1C 5.9        Assessment & Plan:   1. Type 2 diabetes mellitus without complication, without long-term current use of insulin ([facility])   2. Screening for HIV (human immunodeficiency virus)   3. Fatty liver disease, nonalcoholic   4. Elevated LFTs   5. Hyperlipidemia   6. Obesity   7. Cellulitis of leg, right   8. Swollen R knee   9. Elevated glucose   10. Type 2 diabetes mellitus with other skin complications ([place])     Orders Placed This Encounter  Procedures  . Comprehensive metabolic panel  . HIV antibody  . POCT glucose (manual entry)  .  POCT glycosylated hemoglobin (Hb A1C)   Meds ordered this encounter  Medications  . metFORMIN (GLUCOPHAGE) 1000 MG tablet    Sig: 1 tablet twice daily with food    Dispense:  180 tablet    Refill:  3    Return in about 3 months (around 09/13/2015) for recheck.    Giovanne Nickolson Elayne Guerin, M.D. [facility] [address]  [place], [place]  [address] [phone] phone [phone] fax

## 2015-06-15 NOTE — Patient Instructions (Signed)
1. Stop Glipizide/Glucotrol; now only use Glipizide/Glucotrol for sugar > 160. 2.  Increase Metformin to 1000mg  every morning and 1000mg  every evening with food.

## 2015-06-16 LAB — HIV ANTIBODY (ROUTINE TESTING W REFLEX): HIV 1&2 Ab, 4th Generation: NONREACTIVE

## 2015-09-17 ENCOUNTER — Ambulatory Visit: Payer: BLUE CROSS/BLUE SHIELD | Admitting: Family Medicine

## 2015-10-02 ENCOUNTER — Encounter: Payer: Self-pay | Admitting: Family Medicine

## 2015-10-02 ENCOUNTER — Ambulatory Visit (INDEPENDENT_AMBULATORY_CARE_PROVIDER_SITE_OTHER): Payer: BLUE CROSS/BLUE SHIELD | Admitting: Family Medicine

## 2015-10-02 VITALS — BP 126/90 | HR 77 | Temp 98.0°F | Resp 16 | Ht 68.0 in | Wt 265.2 lb

## 2015-10-02 DIAGNOSIS — E118 Type 2 diabetes mellitus with unspecified complications: Secondary | ICD-10-CM | POA: Diagnosis not present

## 2015-10-02 DIAGNOSIS — R945 Abnormal results of liver function studies: Secondary | ICD-10-CM

## 2015-10-02 DIAGNOSIS — E669 Obesity, unspecified: Secondary | ICD-10-CM

## 2015-10-02 DIAGNOSIS — R7989 Other specified abnormal findings of blood chemistry: Secondary | ICD-10-CM

## 2015-10-02 LAB — COMPREHENSIVE METABOLIC PANEL
ALT: 43 U/L (ref 9–46)
AST: 24 U/L (ref 10–35)
Albumin: 4.4 g/dL (ref 3.6–5.1)
Alkaline Phosphatase: 53 U/L (ref 40–115)
BUN: 17 mg/dL (ref 7–25)
CO2: 27 mmol/L (ref 20–31)
Calcium: 9.5 mg/dL (ref 8.6–10.3)
Chloride: 103 mmol/L (ref 98–110)
Creat: 0.9 mg/dL (ref 0.70–1.33)
Glucose, Bld: 102 mg/dL — ABNORMAL HIGH (ref 65–99)
Potassium: 4.3 mmol/L (ref 3.5–5.3)
Sodium: 140 mmol/L (ref 135–146)
Total Bilirubin: 0.5 mg/dL (ref 0.2–1.2)
Total Protein: 6.9 g/dL (ref 6.1–8.1)

## 2015-10-02 LAB — CBC WITH DIFFERENTIAL/PLATELET
Basophils Absolute: 0 cells/uL (ref 0–200)
Basophils Relative: 0 %
Eosinophils Absolute: 162 cells/uL (ref 15–500)
Eosinophils Relative: 2 %
HCT: 43.7 % (ref 38.5–50.0)
Hemoglobin: 15.1 g/dL (ref 13.2–17.1)
Lymphocytes Relative: 36 %
Lymphs Abs: 2916 cells/uL (ref 850–3900)
MCH: 29.9 pg (ref 27.0–33.0)
MCHC: 34.6 g/dL (ref 32.0–36.0)
MCV: 86.5 fL (ref 80.0–100.0)
MPV: 11.9 fL (ref 7.5–12.5)
Monocytes Absolute: 648 cells/uL (ref 200–950)
Monocytes Relative: 8 %
Neutro Abs: 4374 cells/uL (ref 1500–7800)
Neutrophils Relative %: 54 %
Platelets: 206 10*3/uL (ref 140–400)
RBC: 5.05 MIL/uL (ref 4.20–5.80)
RDW: 13.5 % (ref 11.0–15.0)
WBC: 8.1 10*3/uL (ref 3.8–10.8)

## 2015-10-02 LAB — POCT GLYCOSYLATED HEMOGLOBIN (HGB A1C): Hemoglobin A1C: 6.3

## 2015-10-02 LAB — GLUCOSE, POCT (MANUAL RESULT ENTRY): POC Glucose: 106 mg/dl — AB (ref 70–99)

## 2015-10-02 NOTE — Progress Notes (Signed)
Subjective:    Patient ID: Steven Hodge, male    DOB: 1961/03/22, 55 y.o.   MRN: [id number]  10/02/2015  Follow-up; Diabetes; and cellulitis   HPI This 55 y.o. male presents for four month follow-up of diabetes.  Sugars remain elevated in the mornings ranging 130-160.  Later in the day, sugars run less than 130.  Frustrated with elevated morning readings; increase in sugars fasting started when discontinued Glipizide.  Has experimented with evening meals and snacks; fasting sugars remain elevated; if takes Glipizide at bedtime, morning fasting sugars will be less than 120. Average sugar in past month 138.    2.  Cellulitis: no recurrence since last visit.  3. Elevated LFTs: last visit LFTs were better than they had been in years.   Review of Systems  Constitutional: Negative for fever, chills, diaphoresis, activity change, appetite change and fatigue.  Respiratory: Negative for cough and shortness of breath.   Cardiovascular: Negative for chest pain, palpitations and leg swelling.  Gastrointestinal: Negative for nausea, vomiting, abdominal pain and diarrhea.  Endocrine: Negative for cold intolerance, heat intolerance, polydipsia, polyphagia and polyuria.  Skin: Negative for color change, rash and wound.  Neurological: Negative for dizziness, tremors, seizures, syncope, facial asymmetry, speech difficulty, weakness, light-headedness, numbness and headaches.  Psychiatric/Behavioral: Negative for sleep disturbance and dysphoric mood. The patient is not nervous/anxious.     Past Medical History  Diagnosis Date  . Diabetes mellitus without complication ([place])   . Cancer ([facility]) 06/09/2006    Basal cell carcinoma scalp   Past Surgical History  Procedure Laterality Date  . Knee surgery  2005    ACL repair R  . Basel cell cancer removed  2009   No Known Allergies Current Outpatient Prescriptions  Medication Sig Dispense Refill  . aspirin 325 MG tablet Take 325 mg by mouth daily.      [name] BAYER CONTOUR NEXT TEST test strip USE TO CHECK BLOOD GLUCOSE UP TO FOUR TIMES A DAY 100 each 11  . blood glucose meter kit and supplies Dispense based on patient and insurance preference. Use up to four times daily as directed. (FOR ICD-9 250.00, 250.01). 1 each 0  . fish oil-omega-3 fatty acids 1000 MG capsule Take 2 g by mouth daily.    [name] glucosamine-chondroitin 500-400 MG tablet Take 1 tablet by mouth once.    . metFORMIN (GLUCOPHAGE) 1000 MG tablet 1 tablet twice daily with food 180 tablet 3  . Multiple Vitamin (MULTIVITAMIN) tablet Take 1 tablet by mouth daily.    . TURMERIC PO Take 1,000 mg by mouth 2 (two) times daily.    [name] glipiZIDE (GLUCOTROL) 5 MG tablet Take 1 tablet (5 mg total) by mouth 2 (two) times daily before a meal. (Patient not taking: Reported on 10/02/2015) 60 tablet 3   No current facility-administered medications for this visit.   Social History   Social History  . Marital Status: Married    Spouse Name: N/A  . Number of Children: N/A  . Years of Education: N/A   Occupational History  . Not on file.   Social History Main Topics  . Smoking status: Former Research scientist (life sciences)  . Smokeless tobacco: Not on file  . Alcohol Use: No  . Drug Use: No  . Sexual Activity: Yes   Other Topics Concern  . 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 Diabetes Mother   . Cancer Father 59    lung cancer  . Diabetes Father   . Cancer Sister 28    lung cancer  . Heart disease Brother 22    cardiac stenting/CAD  . Cancer Brother     skin cancer       Objective:    BP 126/90 mmHg  Pulse 77  Temp(Src) 98 F (36.7 C) (Oral)  Resp 16  Ht _0  (1.727 m)   Wt 265 lb 3.2 oz (120.294 kg)  BMI 40.33 kg/m2  SpO2 98% Physical Exam  Constitutional: He is oriented to person, place, and time. He appears well-developed and well-nourished. No distress.  HENT:  Head: Normocephalic and atraumatic.  Right Ear: External ear normal.  Left Ear: External ear normal.  Nose: Nose normal.  Mouth/Throat: Oropharynx is clear and moist.  Eyes: Conjunctivae and EOM are normal. Pupils are equal, round, and reactive to light.  Neck: Normal range of motion. Neck supple. Carotid bruit is not present. No thyromegaly present.  Cardiovascular: Normal rate, regular rhythm, normal heart sounds and intact distal pulses.  Exam reveals no gallop and no friction rub.   No murmur heard. Pulmonary/Chest: Effort normal and breath sounds normal. He has no wheezes. He has no rales.  Abdominal: Soft. Bowel sounds are normal. He exhibits no distension and no mass. There is no tenderness. There is no rebound and no guarding.  Lymphadenopathy:    He has no cervical adenopathy.  Neurological: He is alert and oriented to person, place, and time. No cranial nerve deficit.  Skin: Skin is warm and dry. No rash noted. He is not diaphoretic.  Psychiatric: He has a normal mood and affect. His behavior is normal.  Nursing note and vitals reviewed.  Results for orders placed or performed in visit on 10/02/15  CBC with Differential/Platelet  Result Value Ref Range   WBC 8.1 3.8 - 10.8 K/uL   RBC 5.05 4.20 - 5.80 MIL/uL   Hemoglobin 15.1 13.2 - 17.1 g/dL   HCT 43.7 38.5 - 50.0 %   MCV 86.5 80.0 - 100.0 fL   MCH 29.9 27.0 - 33.0 pg   MCHC 34.6 32.0 - 36.0 g/dL   RDW 13.5 11.0 - 15.0 %   Platelets 206 140 - 400 K/uL   MPV 11.9 7.5 - 12.5 fL   Neutro Abs 4374 1500 - 7800 cells/uL   Lymphs Abs 2916 850 - 3900 cells/uL   Monocytes Absolute 648 200 - 950 cells/uL   Eosinophils Absolute 162 15 - 500 cells/uL   Basophils Absolute 0 0 - 200 cells/uL   Neutrophils Relative % 54 %    Lymphocytes Relative 36 %   Monocytes Relative 8 %   Eosinophils Relative 2 %   Basophils Relative 0 %   Smear Review Criteria for review not met   Comprehensive metabolic panel  Result Value Ref Range   Sodium 140 135 - 146 mmol/L   Potassium 4.3 3.5 - 5.3 mmol/L   Chloride 103 98 - 110 mmol/L   CO2  27 20 - 31 mmol/L   Glucose, Bld 102 (H) 65 - 99 mg/dL   BUN 17 7 - 25 mg/dL   Creat 0.90 0.70 - 1.33 mg/dL   Total Bilirubin 0.5 0.2 - 1.2 mg/dL   Alkaline Phosphatase 53 40 - 115 U/L   AST 24 10 - 35 U/L   ALT 43 9 - 46 U/L   Total Protein 6.9 6.1 - 8.1 g/dL   Albumin 4.4 3.6 - 5.1 g/dL   Calcium 9.5 8.6 - 10.3 mg/dL  POCT glucose (manual entry)  Result Value Ref Range   POC Glucose 106 (A) 70 - 99 mg/dl  POCT glycosylated hemoglobin (Hb A1C)  Result Value Ref Range   Hemoglobin A1C 6.3        Assessment & Plan:   1. Type 2 diabetes mellitus with complication, without long-term current use of insulin ([facility])   2. Obesity   3. Morbid obesity due to excess calories ([facility])   4. Elevated LFTs    -well controlled. -do NOT restart Glipizide at qhs; continue Metformin at current dose.   -obtain labs. -RTC in six months.   Orders Placed This Encounter  Procedures  . CBC with Differential/Platelet  . Comprehensive metabolic panel  . POCT glucose (manual entry)  . POCT glycosylated hemoglobin (Hb A1C)  . EKG 12-Lead  . HM Diabetes Foot Exam   No orders of the defined types were placed in this encounter.    Return in about 4 months (around 02/01/2016) for complete physical examiniation.    Seville Brick Elayne Guerin, M.D. [facility] [address] [place], [place]  [address] [phone] phone [phone] fax

## 2015-10-02 NOTE — Patient Instructions (Addendum)
IF you received an x-ray today, you will receive an invoice from [facility]. Please contact [facility] at [phone] with questions or concerns regarding your invoice.   IF you received labwork today, you will receive an invoice from [employer]. Please contact [facility] at [phone] with questions or concerns regarding your invoice.   Our billing staff will not be able to assist you with questions regarding bills from these companies.  You will be contacted with the lab results as soon as they are available. The fastest way to get your results is to activate your My Chart account. Instructions are located on the last page of this paperwork. If you have not heard from [name] regarding the results in 2 weeks, please contact this office.    Diabetes and Sick Day Management Blood sugar (glucose) can be more difficult to control when you are sick. Colds, fever, flu, nausea, vomiting, and diarrhea are all examples of common illnesses that can cause problems for people with diabetes. Loss of body fluids (dehydration) from fever, vomiting, diarrhea, infection, and the stress of a sickness can all cause blood glucose levels to increase. Because of this, it is very important to take your diabetes medicines and to eat some form of carbohydrate food when you are sick. Liquid or soft foods are often tolerated, and they help to replace fluids. HOME CARE INSTRUCTIONS These main guidelines are intended for managing a short-term (24 hours or less) sickness:  Take your usual dose of insulin or oral diabetes medicine. An exception would be if you take any form of metformin. If you cannot eat or drink, you can become dehydrated and should not take this medicine.  Continue to take your insulin even if you are unable to eat solid foods or are vomiting. Your insulin dose may stay the same, or it may need to be increased when you are sick.  You will need to test your  blood glucose more often, generally every 2-4 hours. If you have type 1 diabetes, test your urine for ketones every 4 hours. If you have type 2 diabetes, test your urine for ketones as directed by your health care provider.  Eat some form of food that contains carbohydrates. The carbohydrates can be in solid or liquid form. You should eat 45-50 g of carbohydrates every 3-4 hours.  Replace fluids if you have a fever, vomit, or have diarrhea. Ask your health care provider for specific rehydration instructions.  Watch carefully for the signs of ketoacidosis if you have type 1 diabetes. Call your health care provider if any of the following symptoms are present, especially in children:  Moderate to large ketones in the urine along with a high blood glucose level.  Severe nausea.  Vomiting.  Diarrhea.  Abdominal pain.  Rapid breathing.  Drink extra liquids that do not contain sugar such as water.  Be careful with over-the-counter medicines. Read the labels. They may contain sugar or types of sugars that can increase your blood glucose level. Food Choices for Illness All of the food choices below contain about 15 g of carbohydrates. Plan ahead and keep some of these foods around.    to  cup carbonated beverage containing sugar. Carbonated beverages will usually be better tolerated if they are opened and left at room temperature for a few minutes.   of a twin frozen ice pop.   cup regular gelatin.   cup juice.   cup ice cream or frozen yogurt.  cup cooked cereal.   cup sherbet.  1 cup clear broth or soup.  1 cup cream soup.   cup regular custard.   cup regular pudding.  1 cup sports drink.  1 cup plain yogurt.  1 slice toast.  6 squares saltine crackers.  5 vanilla wafers. SEEK MEDICAL CARE IF:   You are unable to drink fluids, even small amounts.  You have nausea and vomiting for more than 6 hours.  You have diarrhea for more than 6 hours.  Your  blood glucose level is more than 240 mg/dL, even with additional insulin.  There is a change in mental status.  You develop an additional serious sickness.  You have been sick for 2 days and are not getting better.  You have a fever. SEEK IMMEDIATE MEDICAL CARE IF:  You have difficulty breathing.  You have moderate to large ketone levels. MAKE SURE YOU:  Understand these instructions.  Will watch your condition.  Will get help right away if you are not doing well or get worse.   This information is not intended to replace advice given to you by your health care provider. Make sure you discuss any questions you have with your health care provider.   Document Released: 05/29/2003 Document Revised: 06/16/2014 Document Reviewed: 11/02/2012 Elsevier Interactive Patient Education [employer].

## 2015-10-05 DIAGNOSIS — E119 Type 2 diabetes mellitus without complications: Secondary | ICD-10-CM | POA: Diagnosis not present

## 2015-10-22 ENCOUNTER — Telehealth: Payer: Self-pay | Admitting: *Deleted

## 2015-10-22 ENCOUNTER — Telehealth: Payer: Self-pay

## 2015-10-22 NOTE — Telephone Encounter (Signed)
Pt is returning Dr. Thompson Caul call. He said he will be waiting around for her to call him back.

## 2015-10-22 NOTE — Telephone Encounter (Signed)
Patient called back stating he did not see any message from Dr. Tamala Julian in his mychart. After reading Dr. Tamala Julian message to him patients states he did not have an EKG done and he would have remember if he did. He states documentation is wrong in his chart.  He remembers hearing another Steven Hodge in the office on the same day he was here.  Patient also stated he has never had any issues with High Blood Pressure.  Patient would like a phone call back ASAP regarding this.

## 2015-10-22 NOTE — Telephone Encounter (Signed)
Left message on voicemail of home and cell phone. I have asked patient to return my call at his convenience.

## 2015-11-09 DIAGNOSIS — H5210 Myopia, unspecified eye: Secondary | ICD-10-CM | POA: Diagnosis not present

## 2016-04-22 ENCOUNTER — Ambulatory Visit (INDEPENDENT_AMBULATORY_CARE_PROVIDER_SITE_OTHER): Payer: BLUE CROSS/BLUE SHIELD | Admitting: Family Medicine

## 2016-04-22 ENCOUNTER — Telehealth: Payer: Self-pay

## 2016-04-22 ENCOUNTER — Encounter: Payer: Self-pay | Admitting: Family Medicine

## 2016-04-22 VITALS — BP 118/80 | HR 84 | Temp 98.5°F | Resp 18 | Ht 68.0 in | Wt 260.6 lb

## 2016-04-22 DIAGNOSIS — Z23 Encounter for immunization: Secondary | ICD-10-CM | POA: Diagnosis not present

## 2016-04-22 DIAGNOSIS — Z1211 Encounter for screening for malignant neoplasm of colon: Secondary | ICD-10-CM | POA: Diagnosis not present

## 2016-04-22 DIAGNOSIS — Z6839 Body mass index (BMI) 39.0-39.9, adult: Secondary | ICD-10-CM | POA: Diagnosis not present

## 2016-04-22 DIAGNOSIS — Z0000 Encounter for general adult medical examination without abnormal findings: Secondary | ICD-10-CM

## 2016-04-22 DIAGNOSIS — E118 Type 2 diabetes mellitus with unspecified complications: Secondary | ICD-10-CM | POA: Diagnosis not present

## 2016-04-22 DIAGNOSIS — IMO0001 Reserved for inherently not codable concepts without codable children: Secondary | ICD-10-CM

## 2016-04-22 DIAGNOSIS — Z85828 Personal history of other malignant neoplasm of skin: Secondary | ICD-10-CM | POA: Diagnosis not present

## 2016-04-22 DIAGNOSIS — E6609 Other obesity due to excess calories: Secondary | ICD-10-CM

## 2016-04-22 DIAGNOSIS — Z125 Encounter for screening for malignant neoplasm of prostate: Secondary | ICD-10-CM

## 2016-04-22 LAB — POCT URINALYSIS DIP (MANUAL ENTRY)
Bilirubin, UA: NEGATIVE
Blood, UA: NEGATIVE
Glucose, UA: NEGATIVE
Ketones, POC UA: NEGATIVE
Leukocytes, UA: NEGATIVE
Nitrite, UA: NEGATIVE
Protein Ur, POC: NEGATIVE
Spec Grav, UA: 1.015
Urobilinogen, UA: 0.2
pH, UA: 5

## 2016-04-22 LAB — LIPID PANEL
Cholesterol: 196 mg/dL (ref <200)
HDL: 45 mg/dL (ref >40)
LDL Cholesterol: 121 mg/dL — ABNORMAL HIGH (ref <100)
Total CHOL/HDL Ratio: 4.4 Ratio (ref <5.0)
Triglycerides: 150 mg/dL — ABNORMAL HIGH (ref <150)
VLDL: 30 mg/dL (ref <30)

## 2016-04-22 LAB — COMPREHENSIVE METABOLIC PANEL
ALT: 48 U/L — ABNORMAL HIGH (ref 9–46)
AST: 26 U/L (ref 10–35)
Albumin: 4.4 g/dL (ref 3.6–5.1)
Alkaline Phosphatase: 52 U/L (ref 40–115)
BUN: 22 mg/dL (ref 7–25)
CO2: 29 mmol/L (ref 20–31)
Calcium: 9.6 mg/dL (ref 8.6–10.3)
Chloride: 102 mmol/L (ref 98–110)
Creat: 0.88 mg/dL (ref 0.70–1.33)
Glucose, Bld: 146 mg/dL — ABNORMAL HIGH (ref 65–99)
Potassium: 4.7 mmol/L (ref 3.5–5.3)
Sodium: 139 mmol/L (ref 135–146)
Total Bilirubin: 0.4 mg/dL (ref 0.2–1.2)
Total Protein: 7.1 g/dL (ref 6.1–8.1)

## 2016-04-22 LAB — CBC WITH DIFFERENTIAL/PLATELET
Basophils Absolute: 0 cells/uL (ref 0–200)
Basophils Relative: 0 %
Eosinophils Absolute: 246 cells/uL (ref 15–500)
Eosinophils Relative: 3 %
HCT: 47.1 % (ref 38.5–50.0)
Hemoglobin: 16.2 g/dL (ref 13.2–17.1)
Lymphocytes Relative: 31 %
Lymphs Abs: 2542 cells/uL (ref 850–3900)
MCH: 30.9 pg (ref 27.0–33.0)
MCHC: 34.4 g/dL (ref 32.0–36.0)
MCV: 89.9 fL (ref 80.0–100.0)
MPV: 12.2 fL (ref 7.5–12.5)
Monocytes Absolute: 656 cells/uL (ref 200–950)
Monocytes Relative: 8 %
Neutro Abs: 4756 cells/uL (ref 1500–7800)
Neutrophils Relative %: 58 %
Platelets: 218 10*3/uL (ref 140–400)
RBC: 5.24 MIL/uL (ref 4.20–5.80)
RDW: 13.5 % (ref 11.0–15.0)
WBC: 8.2 10*3/uL (ref 3.8–10.8)

## 2016-04-22 LAB — PSA: PSA: 0.4 ng/mL (ref <=4.0)

## 2016-04-22 LAB — TSH: TSH: 2.44 mIU/L (ref 0.40–4.50)

## 2016-04-22 LAB — POCT GLYCOSYLATED HEMOGLOBIN (HGB A1C): Hemoglobin A1C: 6.1

## 2016-04-22 MED ORDER — METFORMIN HCL 1000 MG PO TABS: ORAL_TABLET | ORAL | 3 refills | Status: DC

## 2016-04-22 NOTE — Patient Instructions (Addendum)
   IF you received an x-ray today, you will receive an invoice from [facility]. Please contact [facility] at [phone] with questions or concerns regarding your invoice.   IF you received labwork today, you will receive an invoice from [facility]. Please contact [facility] at [phone] with questions or concerns regarding your invoice.   Our billing staff will not be able to assist you with questions regarding bills from these companies.  You will be contacted with the lab results as soon as they are available. The fastest way to get your results is to activate your My Chart account. Instructions are located on the last page of this paperwork. If you have not heard from us regarding the results in 2 weeks, please contact this office.    Keeping you healthy  Get these tests  Blood pressure- Have your blood pressure checked once a year by your healthcare provider.  Normal blood pressure is 120/80  Weight- Have your body mass index (BMI) calculated to screen for obesity.  BMI is a measure of body fat based on height and weight. You can also calculate your own BMI at www.nhl[place].com/bmi/.  Cholesterol- Have your cholesterol checked every year.  Diabetes- Have your blood sugar checked regularly if you have high blood pressure, high cholesterol, have a family history of diabetes or if you are overweight.  Screening for Colon Cancer- Colonoscopy starting at age 50.  Screening may begin sooner depending on your family history and other health conditions. Follow up colonoscopy as directed by your Gastroenterologist.  Screening for Prostate Cancer- Both blood work (PSA) and a rectal exam help screen for Prostate Cancer.  Screening begins at age 40 with African-American men and at age 50 with Caucasian men.  Screening may begin sooner depending on your family history.  Take these medicines  Aspirin- One aspirin daily can help prevent Heart  disease and Stroke.  Flu shot- Every fall.  Tetanus- Every 10 years.  Zostavax- Once after the age of 60 to prevent Shingles.  Pneumonia shot- Once after the age of 65; if you are younger than 65, ask your healthcare provider if you need a Pneumonia shot.  Take these steps  Don't smoke- If you do smoke, talk to your doctor about quitting.  For tips on how to quit, go to www.smokefree.gov or call 1-800-QUIT-NOW.  Be physically active- Exercise 5 days a week for at least 30 minutes.  If you are not already physically active start slow and gradually work up to 30 minutes of moderate physical activity.  Examples of moderate activity include walking briskly, mowing the yard, dancing, swimming, bicycling, etc.  Eat a healthy diet- Eat a variety of healthy food such as fruits, vegetables, low fat milk, low fat cheese, yogurt, lean meant, poultry, fish, beans, tofu, etc. For more information go to www.thenutritionsource.org  Drink alcohol in moderation- Limit alcohol intake to less than two drinks a day. Never drink and drive.  Dentist- Brush and floss twice daily; visit your dentist twice a year.  Depression- Your emotional health is as important as your physical health. If you're feeling down, or losing interest in things you would normally enjoy please talk to your healthcare provider.  Eye exam- Visit your eye doctor every year.  Safe sex- If you may be exposed to a sexually transmitted infection, use a condom.  Seat belts- Seat belts can save your life; always wear one.  Smoke/Carbon Monoxide detectors- These detectors need to be installed on   the appropriate level of your home.  Replace batteries at least once a year.  Skin cancer- When out in the sun, cover up and use sunscreen 15 SPF or higher.  Violence- If anyone is threatening you, please tell your healthcare provider.  Living Will/ Health care power of attorney- Speak with your healthcare provider and family. 

## 2016-04-22 NOTE — Progress Notes (Signed)
Subjective:    Patient ID: Steven Hodge, male    DOB: 1960/08/30, 55 y.o.   MRN: [id number]  04/22/2016  Annual Exam (CPE )   HPI This 55 y.o. male presents for Complete Physical Examination.  Last physical: Dr. Elder Cyphers in 1996 Colonoscopy:  ready Eye exam:  Goes all the time. Wrinkles in eyes; just started taking pictures in eyes; no change; everything Is fine.  Had extremely high blood pressures at one time.  S/p ACL repaired in knee; Dr. Kathee Delton prescribed medication but blood pressure greatly elevated.  Stopped medicaiton. Knee pain recurred; tumeric placed with improvement for joint pains.  Dental exam:  Immunization History  Administered Date(s) Administered  . Hepatitis B, adult 04/22/2016  . Influenza,inj,Quad PF,36+ Mos 03/09/2015, 04/22/2016  . Pneumococcal Polysaccharide-23 03/09/2015  . Tdap 06/09/2008   BP Readings from Last 3 Encounters:  04/22/16 118/80  10/02/15 126/90  06/15/15 123/71   Wt Readings from Last 3 Encounters:  04/22/16 260 lb 9.6 oz (118.2 kg)  10/02/15 265 lb 3.2 oz (120.3 kg)  06/15/15 259 lb 3.2 oz (117.6 kg)   Fasting sugars 150s usually.  Has found a way to bring it down; talked to pharmacist who feels not eating enough.  Can eat a full meal at 9:00pm and sugar 120 that night.  Eats supper 5:30-6:00pm.  10:00am; gets up at 4:00am.  Eats breakfast 5:00am.  Eats bacon, egg, sausage.  Exercising as much as possible. During the day, usually runs 100-115.   Has always eaten a bedtime snack.   Very rarely gets sugar 180s-200s.  Average on sugars is 138.    Review of Systems  Constitutional: Negative for activity change, appetite change, chills, diaphoresis, fatigue, fever and unexpected weight change.  HENT: Negative for congestion, dental problem, drooling, ear discharge, ear pain, facial swelling, hearing loss, mouth sores, nosebleeds, postnasal drip, rhinorrhea, sinus pressure, sneezing, sore throat, tinnitus, trouble swallowing and voice  change.   Eyes: Negative for photophobia, pain, discharge, redness, itching and visual disturbance.  Respiratory: Negative for apnea, cough, choking, chest tightness, shortness of breath, wheezing and stridor.   Cardiovascular: Negative for chest pain, palpitations and leg swelling.  Gastrointestinal: Negative for abdominal pain, blood in stool, constipation, diarrhea, nausea and vomiting.  Endocrine: Negative for cold intolerance, heat intolerance, polydipsia, polyphagia and polyuria.  Genitourinary: Negative for decreased urine volume, difficulty urinating, discharge, dysuria, enuresis, flank pain, frequency, genital sores, hematuria, penile pain, penile swelling, scrotal swelling, testicular pain and urgency.       Nocturia x 0.  No decreased urinary stream.  Musculoskeletal: Negative for arthralgias, back pain, gait problem, joint swelling, myalgias, neck pain and neck stiffness.  Skin: Negative for color change, pallor, rash and wound.  Allergic/Immunologic: Negative for environmental allergies, food allergies and immunocompromised state.  Neurological: Negative for dizziness, tremors, seizures, syncope, facial asymmetry, speech difficulty, weakness, light-headedness, numbness and headaches.  Hematological: Negative for adenopathy. Does not bruise/bleed easily.  Psychiatric/Behavioral: Negative for agitation, behavioral problems, confusion, decreased concentration, dysphoric mood, hallucinations, self-injury, sleep disturbance and suicidal ideas. The patient is not nervous/anxious and is not hyperactive.        10:30pm; wakes up at 4:00am.     Past Medical History:  Diagnosis Date  . Cancer ([place]) 06/09/2006   Basal cell carcinoma scalp;   [name] Diabetes mellitus without complication [facility])    Past Surgical History:  Procedure Laterality Date  . basel cell cancer removed  2009  . KNEE SURGERY  2005  ACL repair R   No Known Allergies Current Outpatient Prescriptions  Medication Sig  Dispense Refill  . aspirin 325 MG tablet Take 325 mg by mouth daily.    [name] BAYER CONTOUR NEXT TEST test strip USE TO CHECK BLOOD GLUCOSE UP TO FOUR TIMES A DAY 100 each 11  . blood glucose meter kit and supplies Dispense based on patient and insurance preference. Use up to four times daily as directed. (FOR ICD-9 250.00, 250.01). 1 each 0  . fish oil-omega-3 fatty acids 1000 MG capsule Take 2 g by mouth daily.    [name] glucosamine-chondroitin 500-400 MG tablet Take 1 tablet by mouth once.    . metFORMIN (GLUCOPHAGE) 1000 MG tablet 1 tablet twice daily with food 180 tablet 3  . Multiple Vitamin (MULTIVITAMIN) tablet Take 1 tablet by mouth daily.    . TURMERIC PO Take 1,000 mg by mouth 2 (two) times daily.    [name] glipiZIDE (GLUCOTROL) 5 MG tablet Take 1 tablet (5 mg total) by mouth 2 (two) times daily before a meal. (Patient not taking: Reported on 04/22/2016) 60 tablet 3   No current facility-administered medications for this visit.    Social History   Social History  . Marital status: Married    Spouse name: N/A  . Number of children: N/A  . Years of education: N/A   Occupational History  . Not on file.   Social History Main Topics  . Smoking status: Former Research scientist (life sciences)  . Smokeless tobacco: Not on file  . Alcohol use No  . Drug use: No  . Sexual activity: Yes   Other Topics Concern  . 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 Diabetes Mother   . Cancer Father 95    lung cancer  . Diabetes Father   . Cancer Sister 9    lung cancer  . Heart disease Brother 60    cardiac stenting/CAD  . Cancer Brother     skin cancer       Objective:    BP 118/80   Pulse  84   Temp 98.5 F (36.9 C) (Oral)   Resp 18   Ht 5' 8" (1.727 m)   Wt 260 lb 9.6 oz (118.2 kg)   SpO2 98%   BMI 39.62 kg/m  Physical Exam  Constitutional: He is oriented to person, place, and time. He appears well-developed and well-nourished. No distress.  HENT:  Head: Normocephalic and atraumatic.  Right Ear: External ear normal.  Left Ear: External ear normal.  Nose: Nose normal.  Mouth/Throat: Oropharynx is clear and moist.  Eyes: Conjunctivae and EOM are normal. Pupils are equal, round, and reactive to light.  Neck: Normal range of motion. Neck supple. Carotid bruit is not present. No thyromegaly present.  Cardiovascular: Normal rate, regular rhythm, normal heart sounds and intact distal pulses.  Exam reveals no gallop and no friction rub.   No murmur heard. Pulmonary/Chest: Effort normal  and breath sounds normal. He has no wheezes. He has no rales.  Abdominal: Soft. Bowel sounds are normal. He exhibits no distension and no mass. There is no tenderness. There is no rebound and no guarding. Hernia confirmed negative in the right inguinal area and confirmed negative in the left inguinal area.  Genitourinary: Rectum normal, prostate normal, testes normal and penis normal.  Musculoskeletal:       Right shoulder: Normal.       Left shoulder: Normal.       Cervical back: Normal.  Lymphadenopathy:    He has no cervical adenopathy.       Right: No inguinal adenopathy present.       Left: No inguinal adenopathy present.  Neurological: He is alert and oriented to person, place, and time. He has normal reflexes. No cranial nerve deficit. He exhibits normal muscle tone. Coordination normal.  Skin: Skin is warm and dry. No rash noted. He is not diaphoretic.  Psychiatric: He has a normal mood and affect. His behavior is normal. Judgment and thought content normal.   Results for orders placed or performed in visit on 04/22/16  CBC with Differential/Platelet  Result Value Ref Range   WBC  8.2 3.8 - 10.8 K/uL   RBC 5.24 4.20 - 5.80 MIL/uL   Hemoglobin 16.2 13.2 - 17.1 g/dL   HCT 47.1 38.5 - 50.0 %   MCV 89.9 80.0 - 100.0 fL   MCH 30.9 27.0 - 33.0 pg   MCHC 34.4 32.0 - 36.0 g/dL   RDW 13.5 11.0 - 15.0 %   Platelets 218 140 - 400 K/uL   MPV 12.2 7.5 - 12.5 fL   Neutro Abs 4,756 1,500 - 7,800 cells/uL   Lymphs Abs 2,542 850 - 3,900 cells/uL   Monocytes Absolute 656 200 - 950 cells/uL   Eosinophils Absolute 246 15 - 500 cells/uL   Basophils Absolute 0 0 - 200 cells/uL   Neutrophils Relative % 58 %   Lymphocytes Relative 31 %   Monocytes Relative 8 %   Eosinophils Relative 3 %   Basophils Relative 0 %   Smear Review Criteria for review not met   Comprehensive metabolic panel  Result Value Ref Range   Sodium 139 135 - 146 mmol/L   Potassium 4.7 3.5 - 5.3 mmol/L   Chloride 102 98 - 110 mmol/L   CO2 29 20 - 31 mmol/L   Glucose, Bld 146 (H) 65 - 99 mg/dL   BUN 22 7 - 25 mg/dL   Creat 0.88 0.70 - 1.33 mg/dL   Total Bilirubin 0.4 0.2 - 1.2 mg/dL   Alkaline Phosphatase 52 40 - 115 U/L   AST 26 10 - 35 U/L   ALT 48 (H) 9 - 46 U/L   Total Protein 7.1 6.1 - 8.1 g/dL   Albumin 4.4 3.6 - 5.1 g/dL   Calcium 9.6 8.6 - 10.3 mg/dL  Lipid panel  Result Value Ref Range   Cholesterol 196 <200 mg/dL   Triglycerides 150 (H) <150 mg/dL   HDL 45 >40 mg/dL   Total CHOL/HDL Ratio 4.4 <5.0 Ratio   VLDL 30 <30 mg/dL   LDL Cholesterol 121 (H) <100 mg/dL  PSA  Result Value Ref Range   PSA 0.4 <=4.0 ng/mL  TSH  Result Value Ref Range   TSH 2.44 0.40 - 4.50 mIU/L  Microalbumin, urine  Result Value Ref Range   Microalb, Ur 2.2 Not estab mg/dL  POCT urinalysis dipstick  Result Value Ref  Range   Color, UA yellow yellow   Clarity, UA clear clear   Glucose, UA negative negative   Bilirubin, UA negative negative   Ketones, POC UA negative negative   Spec Grav, UA 1.015    Blood, UA negative negative   pH, UA 5.0    Protein Ur, POC negative negative   Urobilinogen, UA 0.2     Nitrite, UA Negative Negative   Leukocytes, UA Negative Negative  POCT glycosylated hemoglobin (Hb A1C)  Result Value Ref Range   Hemoglobin A1C 6.1    Depression screen [facility] 2/9 04/22/2016 10/02/2015 06/15/2015 03/09/2015 03/07/2015  Decreased Interest 0 0 0 0 0  Down, Depressed, Hopeless 0 0 0 0 0  PHQ - 2 Score 0 0 0 0 0   Fall Risk  04/22/2016 10/02/2015 06/15/2015 03/09/2015 03/05/2015  Falls in the past year? _0        Assessment & Plan:   1. Routine physical examination   2. Type 2 diabetes mellitus with complication, without long-term current use of insulin ([place]ones)   3. Screening for prostate cancer   4. Colon cancer screening   5. History of basal cell carcinoma   6. Need for prophylactic vaccination and inoculation against influenza   7. Need for prophylactic vaccination and inoculation against viral hepatitis   8. Class 2 obesity due to excess calories with serious comorbidity and body mass index (BMI) of 39.0 to 39.9 in adult    -anticipatory guidance --- exercise, weight loss. -refer for colonoscopy. -obtain age appropriate screening labs. -refill of Metformin provided. -s/p Hepatitis B#1; RTC three months for hepatitis B#2. -refer to dermatology for follow-up of basal cell carcinoma hx.  Orders Placed This Encounter  Procedures  . Flu Vaccine QUAD 36+ mos IM  . Hepatitis B vaccine adult IM  . CBC with Differential/Platelet  . Comprehensive metabolic panel    Order Specific Question:   Has the patient fasted?    Answer:   Yes  . Lipid panel    Order Specific Question:   Has the patient fasted?    Answer:   Yes  . PSA  . TSH  . Microalbumin, urine  . Ambulatory referral to Gastroenterology    Referral Priority:   Routine    Referral Type:   Consultation    Referral Reason:   Specialty Services Required    Number of Visits Requested:   1  . Ambulatory referral to Dermatology    Referral Priority:   Routine    Referral Type:   Consultation    Referral  Reason:   Specialty Services Required    Requested Specialty:   Dermatology    Number of Visits Requested:   1  . POCT urinalysis dipstick  . POCT glycosylated hemoglobin (Hb A1C)  . EKG 12-Lead   Meds ordered this encounter  Medications  . metFORMIN (GLUCOPHAGE) 1000 MG tablet    Sig: 1 tablet twice daily with food    Dispense:  180 tablet    Refill:  3    Return in about 4 months (around 08/20/2016) for recheck duabetes.    Elayne Guerin, M.D. [facility] [address] [place], [place]  [address] [phone] phone [phone] fax

## 2016-04-22 NOTE — Telephone Encounter (Signed)
Dr. Tamala Julian, as the patient was leaving today, he asked about a referral to a dermatologist. I told him that you had already placed the referral in for him. He then stated that he meant to ask if the referral could for Dr. Amy Martinique. She is apart of the [facility]. If you need me to change the referral and add this, please let me know. Thanks!

## 2016-04-23 LAB — MICROALBUMIN, URINE: Microalb, Ur: 2.2 mg/dL

## 2016-04-23 NOTE — Telephone Encounter (Signed)
Saw this comment as I was working through his referral. I will make sure that it goes to Dr Martinique!

## 2016-05-06 ENCOUNTER — Other Ambulatory Visit: Payer: Self-pay | Admitting: Family Medicine

## 2016-05-23 DIAGNOSIS — Z1211 Encounter for screening for malignant neoplasm of colon: Secondary | ICD-10-CM | POA: Diagnosis not present

## 2016-06-11 DIAGNOSIS — L570 Actinic keratosis: Secondary | ICD-10-CM | POA: Diagnosis not present

## 2016-06-11 DIAGNOSIS — Z85828 Personal history of other malignant neoplasm of skin: Secondary | ICD-10-CM | POA: Diagnosis not present

## 2016-06-11 DIAGNOSIS — D225 Melanocytic nevi of trunk: Secondary | ICD-10-CM | POA: Diagnosis not present

## 2016-06-11 DIAGNOSIS — L918 Other hypertrophic disorders of the skin: Secondary | ICD-10-CM | POA: Diagnosis not present

## 2016-06-11 DIAGNOSIS — L853 Xerosis cutis: Secondary | ICD-10-CM | POA: Diagnosis not present

## 2016-07-04 ENCOUNTER — Encounter: Payer: Self-pay | Admitting: Family Medicine

## 2016-07-04 DIAGNOSIS — Z1211 Encounter for screening for malignant neoplasm of colon: Secondary | ICD-10-CM | POA: Diagnosis not present

## 2016-07-04 DIAGNOSIS — K6389 Other specified diseases of intestine: Secondary | ICD-10-CM | POA: Diagnosis not present

## 2016-07-04 DIAGNOSIS — D120 Benign neoplasm of cecum: Secondary | ICD-10-CM | POA: Diagnosis not present

## 2016-07-04 DIAGNOSIS — D122 Benign neoplasm of ascending colon: Secondary | ICD-10-CM | POA: Diagnosis not present

## 2016-07-04 DIAGNOSIS — K635 Polyp of colon: Secondary | ICD-10-CM | POA: Diagnosis not present

## 2016-07-04 LAB — HM COLONOSCOPY

## 2016-07-07 ENCOUNTER — Encounter: Payer: Self-pay | Admitting: Family Medicine

## 2016-07-25 ENCOUNTER — Other Ambulatory Visit: Payer: Self-pay | Admitting: Physician Assistant

## 2016-08-20 ENCOUNTER — Encounter: Payer: Self-pay | Admitting: Family Medicine

## 2016-08-20 ENCOUNTER — Ambulatory Visit (INDEPENDENT_AMBULATORY_CARE_PROVIDER_SITE_OTHER): Payer: BLUE CROSS/BLUE SHIELD | Admitting: Family Medicine

## 2016-08-20 ENCOUNTER — Telehealth: Payer: Self-pay | Admitting: Family Medicine

## 2016-08-20 VITALS — BP 172/90 | HR 77 | Temp 97.9°F | Ht 68.0 in | Wt 279.4 lb

## 2016-08-20 DIAGNOSIS — E669 Obesity, unspecified: Secondary | ICD-10-CM | POA: Insufficient documentation

## 2016-08-20 DIAGNOSIS — E118 Type 2 diabetes mellitus with unspecified complications: Secondary | ICD-10-CM

## 2016-08-20 DIAGNOSIS — Z860100 Personal history of colon polyps, unspecified: Secondary | ICD-10-CM | POA: Insufficient documentation

## 2016-08-20 DIAGNOSIS — R030 Elevated blood-pressure reading, without diagnosis of hypertension: Secondary | ICD-10-CM

## 2016-08-20 DIAGNOSIS — Z23 Encounter for immunization: Secondary | ICD-10-CM | POA: Diagnosis not present

## 2016-08-20 HISTORY — DX: Personal history of colon polyps, unspecified: Z86.0100

## 2016-08-20 LAB — POCT URINALYSIS DIP (MANUAL ENTRY)
Bilirubin, UA: NEGATIVE
Blood, UA: NEGATIVE
Glucose, UA: NEGATIVE
Ketones, POC UA: NEGATIVE
Leukocytes, UA: NEGATIVE
Nitrite, UA: NEGATIVE
Protein Ur, POC: NEGATIVE
Spec Grav, UA: 1.02
Urobilinogen, UA: 0.2
pH, UA: 8.5 — AB

## 2016-08-20 MED ORDER — GLIPIZIDE 5 MG PO TABS: 5.0000 mg | ORAL_TABLET | Freq: Two times a day (BID) | ORAL | 3 refills | Status: DC

## 2016-08-20 MED ORDER — METFORMIN HCL ER (OSM) 1000 MG PO TB24: 1000.0000 mg | ORAL_TABLET | Freq: Two times a day (BID) | ORAL | 3 refills | Status: DC

## 2016-08-20 NOTE — Patient Instructions (Addendum)
1. Call Dr. Tamala Julian on Saturday with blood pressure readings. 2.  Decrease portion sizes of all meals. 3. Restart Glipizide one tablet with supper. 4.  Wake up at 2:00am and check sugars.    Carbohydrate Counting for Diabetes Mellitus, Adult Carbohydrate counting is a method for keeping track of how many carbohydrates you eat. Eating carbohydrates naturally increases the amount of sugar (glucose) in the blood. Counting how many carbohydrates you eat helps keep your blood glucose within normal limits, which helps you manage your diabetes (diabetes mellitus). It is important to know how many carbohydrates you can safely have in each meal. This is different for every person. A diet and nutrition specialist (registered dietitian) can help you make a meal plan and calculate how many carbohydrates you should have at each meal and snack. Carbohydrates are found in the following foods:  Grains, such as breads and cereals.  Dried beans and soy products.  Starchy vegetables, such as potatoes, peas, and corn.  Fruit and fruit juices.  Milk and yogurt.  Sweets and snack foods, such as cake, cookies, candy, chips, and soft drinks. How do I count carbohydrates? There are two ways to count carbohydrates in food. You can use either of the methods or a combination of both. Reading "Nutrition Facts" on packaged food  The "Nutrition Facts" list is included on the labels of almost all packaged foods and beverages in the [place] It includes:  The serving size.  Information about nutrients in each serving, including the grams (g) of carbohydrate per serving. To use the "Nutrition Facts":  Decide how many servings you will have.  Multiply the number of servings by the number of carbohydrates per serving.  The resulting number is the total amount of carbohydrates that you will be having. Learning standard serving sizes of other foods  When you eat foods containing carbohydrates that are not packaged or do  not include "Nutrition Facts" on the label, you need to measure the servings in order to count the amount of carbohydrates:  Measure the foods that you will eat with a food scale or measuring cup, if needed.  Decide how many standard-size servings you will eat.  Multiply the number of servings by 15. Most carbohydrate-rich foods have about 15 g of carbohydrates per serving.  For example, if you eat 8 oz (170 g) of strawberries, you will have eaten 2 servings and 30 g of carbohydrates (2 servings x 15 g = 30 g).  For foods that have more than one food mixed, such as soups and casseroles, you must count the carbohydrates in each food that is included. The following list contains standard serving sizes of common carbohydrate-rich foods. Each of these servings has about 15 g of carbohydrates:   hamburger bun or  English muffin.   oz (15 mL) syrup.   oz (14 g) jelly.  1 slice of bread.  1 six-inch tortilla.  3 oz (85 g) cooked rice or pasta.  4 oz (113 g) cooked dried beans.  4 oz (113 g) starchy vegetable, such as peas, corn, or potatoes.  4 oz (113 g) hot cereal.  4 oz (113 g) mashed potatoes or  of a large baked potato.  4 oz (113 g) canned or frozen fruit.  4 oz (120 mL) fruit juice.  4-6 crackers.  6 chicken nuggets.  6 oz (170 g) unsweetened dry cereal.  6 oz (170 g) plain fat-free yogurt or yogurt sweetened with artificial sweeteners.  8 oz (240 mL)  milk.  8 oz (170 g) fresh fruit or one small piece of fruit.  24 oz (680 g) popped popcorn. Example of carbohydrate counting Sample meal   3 oz (85 g) chicken breast.  6 oz (170 g) brown rice.  4 oz (113 g) corn.  8 oz (240 mL) milk.  8 oz (170 g) strawberries with sugar-free whipped topping. Carbohydrate calculation  1. Identify the foods that contain carbohydrates:  Rice.  Corn.  Milk.  Strawberries. 2. Calculate how many servings you have of each food:  2 servings rice.  1 serving  corn.  1 serving milk.  1 serving strawberries. 3. Multiply each number of servings by 15 g:  2 servings rice x 15 g = 30 g.  1 serving corn x 15 g = 15 g.  1 serving milk x 15 g = 15 g.  1 serving strawberries x 15 g = 15 g. 4. Add together all of the amounts to find the total grams of carbohydrates eaten:  30 g + 15 g + 15 g + 15 g = 75 g of carbohydrates total. This information is not intended to replace advice given to you by your health care provider. Make sure you discuss any questions you have with your health care provider. Document Released: 05/26/2005 Document Revised: 12/14/2015 Document Reviewed: 11/07/2015 Elsevier Interactive Patient Education  2017 [employer].  IF you received an x-ray today, you will receive an invoice from [facility]. Please contact [facility] at [phone] with questions or concerns regarding your invoice.   IF you received labwork today, you will receive an invoice from [facility]. Please contact [facility] at [phone] with questions or concerns regarding your invoice.   Our billing staff will not be able to assist you with questions regarding bills from these companies.  You will be contacted with the lab results as soon as they are available. The fastest way to get your results is to activate your My Chart account. Instructions are located on the last page of this paperwork. If you have not heard from [name] regarding the results in 2 weeks, please contact this office.

## 2016-08-20 NOTE — Progress Notes (Signed)
Subjective:    Patient ID: Steven Hodge, male    DOB: August 24, 1960, 56 y.o.   MRN: [id number]  08/20/2016  Follow-up (4 mth f/u diabetes)   HPI This 56 y.o. male presents for four month follow-up of DMII, obesity.  Has gained weight since last visit and sugars have increased.  Blood pressure up today.  Feels well.  Fasting sugars 170-236.  In afternoons, before a meal running 80.  Never high in evenings.  Before supper.  After a meal, running 140-150.  Some close to 200.  Occurred right before Christmas; had not been exercising; just started back exercising and not improving.  Trying to exercise 3-4 days per week; sometimes 30 minutes to 90 minutes.   Adding a snack at night did not lower sugars for morning.  Cannot make a difference in fasting sugars.  Taking Metformin at qhs.    Eggs, sausage for breakfast. Grilled chicken, roasted vegetables.  Walked for 2 hours. That night, sugar 200. Water and coffee; splenda. Milk.  S/p colonoscopy; 2 small polyps. Felt bad last week; mildly constipated last week.  Feels great.   Immunization History  Administered Date(s) Administered  . Hepatitis B, adult 04/22/2016, 08/20/2016  . Influenza,inj,Quad PF,36+ Mos 03/09/2015, 04/22/2016  . Pneumococcal Polysaccharide-23 03/09/2015  . Tdap 06/09/2008   BP Readings from Last 3 Encounters:  08/20/16 (!) 172/90  04/22/16 118/80  10/02/15 126/90   Wt Readings from Last 3 Encounters:  08/20/16 279 lb 6.4 oz (126.7 kg)  04/22/16 260 lb 9.6 oz (118.2 kg)  10/02/15 265 lb 3.2 oz (120.3 kg)     Review of Systems  Constitutional: Negative for activity change, appetite change, chills, diaphoresis, fatigue, fever and unexpected weight change.  HENT: Negative for congestion, dental problem, drooling, ear discharge, ear pain, facial swelling, hearing loss, mouth sores, nosebleeds, postnasal drip, rhinorrhea, sinus pressure, sneezing, sore throat, tinnitus, trouble swallowing and voice change.   Eyes:  Negative for photophobia, pain, discharge, redness, itching and visual disturbance.  Respiratory: Negative for apnea, cough, choking, chest tightness, shortness of breath, wheezing and stridor.   Cardiovascular: Negative for chest pain, palpitations and leg swelling.  Gastrointestinal: Negative for abdominal pain, blood in stool, constipation, diarrhea, nausea and vomiting.  Endocrine: Negative for cold intolerance, heat intolerance, polydipsia, polyphagia and polyuria.  Genitourinary: Negative for decreased urine volume, difficulty urinating, discharge, dysuria, enuresis, flank pain, frequency, genital sores, hematuria, penile pain, penile swelling, scrotal swelling, testicular pain and urgency.  Musculoskeletal: Negative for arthralgias, back pain, gait problem, joint swelling, myalgias, neck pain and neck stiffness.  Skin: Negative for color change, pallor, rash and wound.  Allergic/Immunologic: Negative for environmental allergies, food allergies and immunocompromised state.  Neurological: Negative for dizziness, tremors, seizures, syncope, facial asymmetry, speech difficulty, weakness, light-headedness, numbness and headaches.  Hematological: Negative for adenopathy. Does not bruise/bleed easily.  Psychiatric/Behavioral: Negative for agitation, behavioral problems, confusion, decreased concentration, dysphoric mood, hallucinations, self-injury, sleep disturbance and suicidal ideas. The patient is not nervous/anxious and is not hyperactive.     Past Medical History:  Diagnosis Date  . Cancer ([place]) 06/09/2006   Basal cell carcinoma scalp;   [name] Diabetes mellitus without complication [facility])    Past Surgical History:  Procedure Laterality Date  . basel cell cancer removed  2009  . KNEE SURGERY  2005   ACL repair R   No Known Allergies  Social History   Social History  . Marital status: Married    Spouse name: N/A  . Number  of children: N/A  . Years of education: N/A   Occupational  History  . Not on file.   Social History Main Topics  . Smoking status: Former Research scientist (life sciences)  . Smokeless tobacco: Never Used  . Alcohol use No  . Drug use: No  . Sexual activity: Yes   Other Topics Concern  . 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 Diabetes Mother   . Cancer Father 35    lung cancer  . Diabetes Father   . Cancer Sister 54    lung cancer  . Heart disease Brother 95    cardiac stenting/CAD  . Cancer Brother     skin cancer       Objective:    BP (!) 172/90 (BP Location: Left Arm, Patient Position: Sitting, Cuff Size: Large)   Pulse 77   Temp 97.9 F (36.6 C) (Oral)   Ht 5\' 8"  (1.727 m)   Wt 279 lb 6.4 oz (126.7 kg)   SpO2 97%   BMI 42.48 kg/m  Physical Exam  Constitutional: He is oriented to person, place, and time. He appears well-developed and well-nourished. No distress.  HENT:  Head: Normocephalic and atraumatic.  Right Ear: External ear normal.  Left Ear: External ear normal.  Nose: Nose normal.  Mouth/Throat: Oropharynx is clear and moist.  Eyes: Conjunctivae and EOM are normal. Pupils are equal, round, and reactive to light.  Neck: Normal range of motion. Neck supple. Carotid bruit is not present. No thyromegaly present.  Cardiovascular: Normal rate, regular rhythm, normal heart sounds and intact distal pulses.  Exam reveals no gallop and no friction rub.   No murmur heard. Pulmonary/Chest: Effort normal and breath sounds normal. He has no wheezes. He has no rales.  Abdominal: Soft. Bowel sounds are normal. He exhibits no distension and no mass. There is no tenderness. There is no rebound and no guarding.  Lymphadenopathy:      He has no cervical adenopathy.  Neurological: He is alert and oriented to person, place, and time. No cranial nerve deficit.  Skin: Skin is warm and dry. No rash noted. He is not diaphoretic.  Psychiatric: He has a normal mood and affect. His behavior is normal.  Nursing note and vitals reviewed.  Depression screen [facility] 2/9 08/20/2016 04/22/2016 10/02/2015 06/15/2015 03/09/2015  Decreased Interest 0 0 0 0 0  Down, Depressed, Hopeless 0 0 0 0 0  PHQ - 2 Score 0 0 0 0 0   Fall Risk  08/20/2016 04/22/2016 10/02/2015 06/15/2015 03/09/2015  Falls in the past year? No No No No No        Assessment & Plan:   1. Type 2 diabetes mellitus with complication, without long-term current use of insulin ([place])   2. Morbid obesity ([place])   3. Need for prophylactic vaccination and inoculation against viral hepatitis   4. History of colonic polyps   5. Blood pressure elevated without history of  HTN    -worsening sugars recently; fasting sugars elevated despite several changes to diet; change to Metformin ER 1000mg  two daily; restart glipizide 5mg  bid.  -monitor blood pressure closely over next week and call with readings. -check sugars closely for next week and call with readings. -s/p Hepatitis B#2.  RTC six months for hepatitis B#3.  -refer to medical weight management for assistance with weight loss. -pt called with persistently elevated blood pressure readings after visit the following week; added Losartan HCTZ 50-12.5mg  one daily.   Orders Placed This Encounter  Procedures  . Hepatitis B vaccine adult IM  . CBC with Differential/Platelet  . Comprehensive metabolic panel  . Hemoglobin A1c  . Amb Ref to Medical Weight Management    Referral Priority:   Routine    Referral Type:   Consultation    Number of Visits Requested:   1  . POCT urinalysis dipstick   Meds ordered this encounter  Medications  . DISCONTD: metformin (FORTAMET) 1000 MG (OSM) 24 hr tablet    Sig: Take 1 tablet (1,000 mg  total) by mouth 2 (two) times daily with a meal.    Dispense:  180 tablet    Refill:  3  . glipiZIDE (GLUCOTROL) 5 MG tablet    Sig: Take 1 tablet (5 mg total) by mouth 2 (two) times daily before a meal.    Dispense:  60 tablet    Refill:  3  . metFORMIN (GLUCOPHAGE-XR) 500 MG 24 hr tablet    Sig: Take 2 tablets (1,000 mg total) by mouth 2 (two) times daily.    Dispense:  360 tablet    Refill:  3  . DISCONTD: losartan-hydrochlorothiazide (HYZAAR) 50-12.5 MG tablet    Sig: Take 1 tablet by mouth daily.    Dispense:  90 tablet    Refill:  1  . losartan-hydrochlorothiazide (HYZAAR) 50-12.5 MG tablet    Sig: Take 1 tablet by mouth daily.    Dispense:  90 tablet    Refill:  1    Return in about 3 months (around 11/20/2016) for recheck diabetes.   Jeriko Kowalke Elayne Guerin, M.D. Primary [facility] at [facility] [place] [address] [place], [place]  [address] [phone] phone [phone] fax

## 2016-08-20 NOTE — Telephone Encounter (Signed)
Pharmacy has questions about the fortamet 24 hr   Best number is [phone]

## 2016-08-21 LAB — CBC WITH DIFFERENTIAL/PLATELET
Basophils Absolute: 0 10*3/uL (ref 0.0–0.2)
Basos: 0 %
EOS (ABSOLUTE): 0.2 10*3/uL (ref 0.0–0.4)
Eos: 3 %
Hematocrit: 47.1 % (ref 37.5–51.0)
Hemoglobin: 15.9 g/dL (ref 13.0–17.7)
Immature Grans (Abs): 0 10*3/uL (ref 0.0–0.1)
Immature Granulocytes: 0 %
Lymphocytes Absolute: 2.3 10*3/uL (ref 0.7–3.1)
Lymphs: 37 %
MCH: 29.6 pg (ref 26.6–33.0)
MCHC: 33.8 g/dL (ref 31.5–35.7)
MCV: 88 fL (ref 79–97)
Monocytes Absolute: 0.5 10*3/uL (ref 0.1–0.9)
Monocytes: 7 %
Neutrophils Absolute: 3.3 10*3/uL (ref 1.4–7.0)
Neutrophils: 53 %
Platelets: 181 10*3/uL (ref 150–379)
RBC: 5.38 x10E6/uL (ref 4.14–5.80)
RDW: 13.7 % (ref 12.3–15.4)
WBC: 6.3 10*3/uL (ref 3.4–10.8)

## 2016-08-21 LAB — COMPREHENSIVE METABOLIC PANEL
ALT: 88 IU/L — ABNORMAL HIGH (ref 0–44)
AST: 41 IU/L — ABNORMAL HIGH (ref 0–40)
Albumin/Globulin Ratio: 1.6 (ref 1.2–2.2)
Albumin: 4.4 g/dL (ref 3.5–5.5)
Alkaline Phosphatase: 58 IU/L (ref 39–117)
BUN/Creatinine Ratio: 22 — ABNORMAL HIGH (ref 9–20)
BUN: 17 mg/dL (ref 6–24)
Bilirubin Total: 0.3 mg/dL (ref 0.0–1.2)
CO2: 22 mmol/L (ref 18–29)
Calcium: 9.8 mg/dL (ref 8.7–10.2)
Chloride: 98 mmol/L (ref 96–106)
Creatinine, Ser: 0.79 mg/dL (ref 0.76–1.27)
GFR calc Af Amer: 119 mL/min/{1.73_m2} (ref >59)
GFR calc non Af Amer: 103 mL/min/{1.73_m2} (ref >59)
Globulin, Total: 2.7 g/dL (ref 1.5–4.5)
Glucose: 152 mg/dL — ABNORMAL HIGH (ref 65–99)
Potassium: 4.7 mmol/L (ref 3.5–5.2)
Sodium: 140 mmol/L (ref 134–144)
Total Protein: 7.1 g/dL (ref 6.0–8.5)

## 2016-08-21 LAB — HEMOGLOBIN A1C
Est. average glucose Bld gHb Est-mCnc: 146 mg/dL
Hgb A1c MFr Bld: 6.7 % — ABNORMAL HIGH (ref 4.8–5.6)

## 2016-08-22 ENCOUNTER — Telehealth: Payer: Self-pay | Admitting: Family Medicine

## 2016-08-22 NOTE — Telephone Encounter (Signed)
MyChart message sent to patient about rescheduling their appt with Dr Tamala Julian on 11/19/16.

## 2016-08-23 ENCOUNTER — Telehealth: Payer: Self-pay | Admitting: Family Medicine

## 2016-08-23 ENCOUNTER — Encounter: Payer: Self-pay | Admitting: Family Medicine

## 2016-08-23 MED ORDER — METFORMIN HCL ER 500 MG PO TB24: 1000.0000 mg | ORAL_TABLET | Freq: Two times a day (BID) | ORAL | 3 refills | Status: DC

## 2016-08-23 MED ORDER — LOSARTAN POTASSIUM-HCTZ 50-12.5 MG PO TABS: 1.0000 | ORAL_TABLET | Freq: Every day | ORAL | 1 refills | Status: DC

## 2016-08-23 NOTE — Telephone Encounter (Signed)
Pt called following up on his e-mail from this morning. He said an alternative pharmacy to use for today would be [facility] in [place] ([address] [name] [id number]) for bp medicine. He also would like to be advised on his blood sugar that he mentioned in e-mail and was wondering if he would be contacted today. Best pt callback number is [phone].

## 2016-08-25 ENCOUNTER — Telehealth: Payer: Self-pay | Admitting: *Deleted

## 2016-08-25 NOTE — Telephone Encounter (Signed)
Pharmacy wants to change metformin states patient has never been on XR and wants to know if you want to keep this prescription or go back to what he usually takes

## 2016-08-26 NOTE — Telephone Encounter (Signed)
Spoke with pharmacist on 08/25/16; advised to fill Metformin ER.

## 2016-08-27 NOTE — Telephone Encounter (Signed)
Spoke with patient on Saturday, 08/23/16. No further action warranted.

## 2016-08-28 ENCOUNTER — Encounter (INDEPENDENT_AMBULATORY_CARE_PROVIDER_SITE_OTHER): Payer: BLUE CROSS/BLUE SHIELD | Admitting: Family Medicine

## 2016-09-18 ENCOUNTER — Encounter (INDEPENDENT_AMBULATORY_CARE_PROVIDER_SITE_OTHER): Payer: Self-pay | Admitting: Family Medicine

## 2016-09-18 ENCOUNTER — Ambulatory Visit (INDEPENDENT_AMBULATORY_CARE_PROVIDER_SITE_OTHER): Payer: BLUE CROSS/BLUE SHIELD | Admitting: Family Medicine

## 2016-09-18 ENCOUNTER — Other Ambulatory Visit (INDEPENDENT_AMBULATORY_CARE_PROVIDER_SITE_OTHER): Payer: Self-pay | Admitting: Family Medicine

## 2016-09-18 VITALS — BP 136/82 | HR 75 | Temp 98.0°F | Ht 68.0 in | Wt 272.0 lb

## 2016-09-18 DIAGNOSIS — I10 Essential (primary) hypertension: Secondary | ICD-10-CM

## 2016-09-18 DIAGNOSIS — Z0289 Encounter for other administrative examinations: Secondary | ICD-10-CM

## 2016-09-18 DIAGNOSIS — Z9189 Other specified personal risk factors, not elsewhere classified: Secondary | ICD-10-CM

## 2016-09-18 DIAGNOSIS — Z1331 Encounter for screening for depression: Secondary | ICD-10-CM

## 2016-09-18 DIAGNOSIS — R0602 Shortness of breath: Secondary | ICD-10-CM

## 2016-09-18 DIAGNOSIS — E119 Type 2 diabetes mellitus without complications: Secondary | ICD-10-CM

## 2016-09-18 DIAGNOSIS — Z1389 Encounter for screening for other disorder: Secondary | ICD-10-CM | POA: Diagnosis not present

## 2016-09-18 DIAGNOSIS — R5383 Other fatigue: Secondary | ICD-10-CM | POA: Diagnosis not present

## 2016-09-18 HISTORY — DX: Essential (primary) hypertension: I10

## 2016-09-18 NOTE — Progress Notes (Signed)
Office: [phone]  /  Fax: [phone]   Dear Dr. Tamala Julian,   Thank you for referring [name] to our clinic. The following note includes my evaluation and treatment recommendations.  HPI:   Chief Complaint: OBESITY  LYNDEN FLEMMER has been referred by Dr. Renette Butters. Smith for consultation regarding his obesity and obesity related comorbidities.  HOGAN HOOBLER (MR# [id number]) is a 56 y.o. male who presents on 09/18/2016 for obesity evaluation and treatment. Current BMI is Body mass index is 41.36 kg/m.[name] Viraaj has struggled with obesity for years and has been unsuccessful in either losing weight or maintaining long term weight loss. [name] attended our information session and states he is currently in the action stage of change and ready to dedicate time achieving and maintaining a healthier weight.  Alexander states his family eats meals together he thinks his family will eat healthier with  him he struggles with family and or coworkers weight loss sabotage he started gaining weight around age 67 his heaviest weight ever was 320 lbs. he has significant food cravings issues  he frequently eats larger portions than normal  he has binge eating behaviors   Fatigue Yug feels his energy is lower than it should be. This has worsened with weight gain and has not worsened recently. Nickolaos admits to daytime somnolence and  denies waking up still tired. Patient is at risk for obstructive sleep apnea. Patent has a history of symptoms of daytime fatigue, Epworth sleepiness scale and hypertension. Patient generally gets 5 or 6 hours of sleep per night, and states they generally have restful sleep. Snoring is present. Apneic episodes are present. Epworth Sleepiness Score is 15  Dyspnea on exertion Preston notes increasing shortness of breath with climbing 1 flight of stairs, exercising and seems to be worsening over time with weight gain. He notes getting out of breath sooner with activity than he used  to. Kindred has a smoking history but now quit. He has no diagnosis of COPD, he is pretty physically active overall. Kyan denies orthopnea.  Diabetes II Hugo has a diagnosis of diabetes type II. Damieon states fasting blood sugars are running between 139 and 180 and 2 hour post prandial 89 and 240. He denies any hypoglycemic episodes. Daimen is on Metformin and Glipizide and last A1c was at 6.7, slowly creeping up over the last year from 5.9 He has been working on intensive lifestyle modifications including diet, exercise, and weight loss to help control his blood glucose levels.  Hypertension SYLVESTER MINTON is a 57 y.o. male with hypertension.  [name] denies chest pain. He is working weight loss to help control his blood pressure with the goal of decreasing his risk of heart attack and stroke. [name] blood pressure is currently controlled today. He is on Losartan-HCTZ.  At risk for cardiovascular disease Willie is at a higher than average risk for cardiovascular disease due to obesity and diabetes. He currently denies any chest pain.  Depression Screen Cre[place]'s Food and Mood (modified PHQ-9) score was  Depression screen [facility] 2/9 09/18/2016  Decreased Interest 0  Down, Depressed, Hopeless 0  PHQ - 2 Score 0  Altered sleeping 0  Tired, decreased energy 0  Change in appetite 0  Feeling bad or failure about yourself  0  Trouble concentrating 0  Moving slowly or fidgety/restless 0  Suicidal thoughts 0  PHQ-9 Score 0    ALLERGIES: No Known Allergies  MEDICATIONS: Current Outpatient Prescriptions on File Prior to Visit  Medication Sig Dispense Refill  . aspirin 325 MG tablet Take 325 mg by mouth daily.    [name] BAYER CONTOUR NEXT TEST test strip USE TO CHECK BLOOD SUGAR UP TO 4 TIMES ADAY 100 each 1  . blood glucose meter kit and supplies Dispense based on patient and insurance preference. Use up to four times daily as directed. (FOR ICD-9 250.00, 250.01). 1 each 0  . fish oil-omega-3 fatty  acids 1000 MG capsule Take 2 g by mouth daily.    [name] glipiZIDE (GLUCOTROL) 5 MG tablet Take 1 tablet (5 mg total) by mouth 2 (two) times daily before a meal. 60 tablet 3  . glucosamine-chondroitin 500-400 MG tablet Take 1 tablet by mouth once.    [name] losartan-hydrochlorothiazide (HYZAAR) 50-12.5 MG tablet Take 1 tablet by mouth daily. 90 tablet 1  . metFORMIN (GLUCOPHAGE-XR) 500 MG 24 hr tablet Take 2 tablets (1,000 mg total) by mouth 2 (two) times daily. 360 tablet 3  . Multiple Vitamin (MULTIVITAMIN) tablet Take 1 tablet by mouth daily.    . TURMERIC PO Take 1,000 mg by mouth 2 (two) times daily.     No current facility-administered medications on file prior to visit.     PAST MEDICAL HISTORY: Past Medical History:  Diagnosis Date  . Back pain   . Cancer ([place]) 06/09/2006   Basal cell carcinoma scalp;   [name] Diabetes mellitus without complication ([place])   . Hx of blood clots   . Hypertension   . Joint pain   . Swelling     PAST SURGICAL HISTORY: Past Surgical History:  Procedure Laterality Date  . basel cell cancer removed  2009  . KNEE SURGERY  2005   ACL repair R    SOCIAL HISTORY: Social History  Substance Use Topics  . Smoking status: Former Research scientist (life sciences)  . Smokeless tobacco: Never Used  . Alcohol use No    FAMILY HISTORY: Family History  Problem Relation Age of Onset  . Diabetes Mother   . Liver disease Mother   . Obesity Mother   . Cancer Father 89    lung cancer  . Diabetes Father   . Cancer Sister 67    lung cancer  . Heart disease Brother 16    cardiac stenting/CAD  . Cancer Brother     skin cancer    ROS: Review of Systems  Constitutional: Positive for malaise/fatigue.  HENT: Positive for tinnitus.   Eyes:       Wear Glasses or Contacts  Respiratory: Positive for shortness of breath (with exertion).   Cardiovascular: Positive for claudication. Negative for chest pain and orthopnea.  Gastrointestinal: Positive for constipation.  Musculoskeletal: Positive  for back pain, joint pain and myalgias.  Neurological: Positive for headaches.  Endo/Heme/Allergies:       Hay Fever Negative hypoglycemia    PHYSICAL EXAM: Blood pressure 136/82, pulse 75, temperature 98 F (36.7 C), height '5\' 8"'  (1.727 m), weight 272 lb (123.4 kg), SpO2 98 %. Body mass index is 41.36 kg/m. Physical Exam  Constitutional: He is oriented to person, place, and time. He appears well-developed and well-nourished.  Cardiovascular: Normal rate.   Pulmonary/Chest: Effort normal.  Musculoskeletal: Normal range of motion.  Neurological: He is oriented to person, place, and time.  Skin: Skin is warm and dry.  Psychiatric: He has a normal mood and affect. His behavior is normal.  Vitals reviewed.   RECENT LABS AND TESTS: BMET    Component Value Date/Time   NA 140 08/20/2016  1023   K 4.7 08/20/2016 1023   CL 98 08/20/2016 1023   CO2 22 08/20/2016 1023   GLUCOSE 152 (H) 08/20/2016 1023   GLUCOSE 146 (H) 04/22/2016 1349   BUN 17 08/20/2016 1023   CREATININE 0.79 08/20/2016 1023   CREATININE 0.88 04/22/2016 1349   CALCIUM 9.8 08/20/2016 1023   GFRNONAA 103 08/20/2016 1023   GFRAA 119 08/20/2016 1023   Lab Results  Component Value Date   HGBA1C 6.7 (H) 08/20/2016   No results found for: INSULIN CBC    Component Value Date/Time   WBC 6.3 08/20/2016 1023   WBC 8.2 04/22/2016 1349   RBC 5.38 08/20/2016 1023   RBC 5.24 04/22/2016 1349   HGB 16.2 04/22/2016 1349   HCT 47.1 08/20/2016 1023   PLT 181 08/20/2016 1023   MCV 88 08/20/2016 1023   MCH 29.6 08/20/2016 1023   MCH 30.9 04/22/2016 1349   MCHC 33.8 08/20/2016 1023   MCHC 34.4 04/22/2016 1349   RDW 13.7 08/20/2016 1023   LYMPHSABS 2.3 08/20/2016 1023   MONOABS 656 04/22/2016 1349   EOSABS 0.2 08/20/2016 1023   BASOSABS 0.0 08/20/2016 1023   Iron/TIBC/Ferritin/ %Sat No results found for: IRON, TIBC, FERRITIN, IRONPCTSAT Lipid Panel     Component Value Date/Time   CHOL 196 04/22/2016 1349   TRIG  150 (H) 04/22/2016 1349   HDL 45 04/22/2016 1349   CHOLHDL 4.4 04/22/2016 1349   VLDL 30 04/22/2016 1349   LDLCALC 121 (H) 04/22/2016 1349   Hepatic Function Panel     Component Value Date/Time   PROT 7.1 08/20/2016 1023   ALBUMIN 4.4 08/20/2016 1023   AST 41 (H) 08/20/2016 1023   ALT 88 (H) 08/20/2016 1023   ALKPHOS 58 08/20/2016 1023   BILITOT 0.3 08/20/2016 1023      Component Value Date/Time   TSH 2.44 04/22/2016 1349   TSH 1.731 03/09/2015 1524    ECG  shows NSR with a rate of 74 BPM INDIRECT CALORIMETER done today shows a VO2 of 337 and a REE of 2343.    ASSESSMENT AND PLAN: Type 2 diabetes mellitus without complication, without long-term current use of insulin ([place]) - Plan: Hemoglobin A1c, Insulin, random, Microalbumin / creatinine urine ratio  Other fatigue - Plan: EKG 12-Lead, Comprehensive metabolic panel, CBC with Differential/Platelet, Lipid Panel With LDL/HDL Ratio, VITAMIN D 25 Hydroxy (Vit-D Deficiency, Fractures), Vitamin B12, Folate, TSH, T4, free, T3  SOB (shortness of breath) on exertion  Essential hypertension  At risk for heart disease  Depression screening  Morbid obesity ([place])  PLAN: Fatigue Aldair was informed that his fatigue may be related to obesity, depression or many other causes. Labs will be ordered, and in the meanwhile Ayoub has agreed to work on diet, exercise and weight loss to help with fatigue. Proper sleep hygiene was discussed including the need for 7-8 hours of quality sleep each night. A sleep study was not ordered based on symptoms and Epworth score.  Dyspnea on exertion Emit's shortness of breath appears to be obesity related and exercise induced. He has agreed to work on weight loss and gradually increase exercise to treat his exercise induced shortness of breath. If [name] follows our instructions and loses weight without improvement of his shortness of breath, we will plan to refer to pulmonology. We will check indirect  calorimeter and we will monitor this condition regularly. Keen agrees to this plan. Ana agrees to follow up with our clinic in 2 weeks.  Diabetes  II Chirstopher has been given extensive diabetes education by myself today including ideal fasting and post-prandial blood glucose readings, individual ideal Hgb A1c goals  and hypoglycemia prevention. We discussed the importance of good blood sugar control to decrease the likelihood of diabetic complications such as nephropathy, neuropathy, limb loss, blindness, coronary artery disease, and death. We discussed the importance of intensive lifestyle modification including diet, exercise and weight loss as the first line treatment for diabetes. Ariv agrees to continue his diabetes medications and will follow up at the agreed upon time.  Hypertension We discussed sodium restriction, working on healthy weight loss, and a regular exercise program as the means to achieve improved blood pressure control. [name] agreed with this plan and agreed to follow up as directed. We will check EKG and we will continue to monitor his blood pressure as well as his progress with the above lifestyle modifications. He will continue his medications as prescribed and will watch for signs of hypotension as he continues his lifestyle modifications.  Cardiovascular risk counselling Corion was given extended (at least 15 minutes) coronary artery disease prevention counseling today. He is 56 y.o. male and has risk factors for heart disease including obesity. We discussed intensive lifestyle modifications today with an emphasis on specific weight loss instructions and strategies. Pt was also informed of the importance of increasing exercise and decreasing saturated fats to help prevent heart disease.  Depression Screen Elmo had a negative depression screening. Depression is commonly associated with obesity and often results in emotional eating behaviors. We will monitor this closely and work on  CBT to help improve the non-hunger eating patterns. Referral to Psychology may be required if no improvement is seen as he continues in our clinic.  Obesity Nemesio is currently in the action stage of change and his goal is to continue with weight loss efforts. I recommend Martino begin the structured treatment plan as follows:  He has agreed to follow the Category 3 plan +100 calories Reynol has been instructed to eventually work up to a goal of 150 minutes of combined cardio and strengthening exercise per week for weight loss and overall health benefits. We discussed the following Behavioral Modification Stratagies today: increasing lean protein intake, increasing lower sugar fruits and work on meal planning and easy cooking plans  Jaqua has agreed to join our obesity program and follow up with our clinic in 2 weeks. He was informed of the importance of frequent follow up visits to maximize his success with intensive lifestyle modifications for his multiple health conditions. He was informed we would discuss his lab results at his next visit unless there is a critical issue that needs to be addressed sooner. Destyn agreed to keep his next visit at the agreed upon time to discuss these results.  I, Doreene Nest, am acting as scribe for Dennard Nip, MD  I have reviewed the above documentation for accuracy and completeness, and I agree with the above. -Dennard Nip, MD

## 2016-09-19 LAB — LIPID PANEL WITH LDL/HDL RATIO
Cholesterol, Total: 195 mg/dL (ref 100–199)
HDL: 44 mg/dL (ref >39)
LDL Calculated: 119 mg/dL — ABNORMAL HIGH (ref 0–99)
LDl/HDL Ratio: 2.7 ratio (ref 0.0–3.6)
Triglycerides: 161 mg/dL — ABNORMAL HIGH (ref 0–149)
VLDL Cholesterol Cal: 32 mg/dL (ref 5–40)

## 2016-09-19 LAB — TSH: TSH: 2.23 u[IU]/mL (ref 0.450–4.500)

## 2016-09-19 LAB — CBC WITH DIFFERENTIAL/PLATELET
Basophils Absolute: 0 10*3/uL (ref 0.0–0.2)
Basos: 0 %
EOS (ABSOLUTE): 0.2 10*3/uL (ref 0.0–0.4)
Eos: 3 %
Hematocrit: 43.1 % (ref 37.5–51.0)
Hemoglobin: 14.8 g/dL (ref 13.0–17.7)
Immature Grans (Abs): 0 10*3/uL (ref 0.0–0.1)
Immature Granulocytes: 0 %
Lymphocytes Absolute: 2.1 10*3/uL (ref 0.7–3.1)
Lymphs: 33 %
MCH: 30 pg (ref 26.6–33.0)
MCHC: 34.3 g/dL (ref 31.5–35.7)
MCV: 87 fL (ref 79–97)
Monocytes Absolute: 0.5 10*3/uL (ref 0.1–0.9)
Monocytes: 8 %
Neutrophils Absolute: 3.6 10*3/uL (ref 1.4–7.0)
Neutrophils: 56 %
Platelets: 197 10*3/uL (ref 150–379)
RBC: 4.93 x10E6/uL (ref 4.14–5.80)
RDW: 13.5 % (ref 12.3–15.4)
WBC: 6.4 10*3/uL (ref 3.4–10.8)

## 2016-09-19 LAB — COMPREHENSIVE METABOLIC PANEL
ALT: 75 IU/L — ABNORMAL HIGH (ref 0–44)
AST: 34 IU/L (ref 0–40)
Albumin/Globulin Ratio: 1.8 (ref 1.2–2.2)
Albumin: 4.4 g/dL (ref 3.5–5.5)
Alkaline Phosphatase: 56 IU/L (ref 39–117)
BUN/Creatinine Ratio: 20 (ref 9–20)
BUN: 16 mg/dL (ref 6–24)
Bilirubin Total: 0.3 mg/dL (ref 0.0–1.2)
CO2: 28 mmol/L (ref 18–29)
Calcium: 9.5 mg/dL (ref 8.7–10.2)
Chloride: 100 mmol/L (ref 96–106)
Creatinine, Ser: 0.79 mg/dL (ref 0.76–1.27)
GFR calc Af Amer: 119 mL/min/{1.73_m2} (ref >59)
GFR calc non Af Amer: 103 mL/min/{1.73_m2} (ref >59)
Globulin, Total: 2.5 g/dL (ref 1.5–4.5)
Glucose: 139 mg/dL — ABNORMAL HIGH (ref 65–99)
Potassium: 5.5 mmol/L — ABNORMAL HIGH (ref 3.5–5.2)
Sodium: 143 mmol/L (ref 134–144)
Total Protein: 6.9 g/dL (ref 6.0–8.5)

## 2016-09-19 LAB — T3: T3, Total: 111 ng/dL (ref 71–180)

## 2016-09-19 LAB — HEMOGLOBIN A1C
Est. average glucose Bld gHb Est-mCnc: 146 mg/dL
Hgb A1c MFr Bld: 6.7 % — ABNORMAL HIGH (ref 4.8–5.6)

## 2016-09-19 LAB — MICROALBUMIN / CREATININE URINE RATIO
Creatinine, Urine: 105.3 mg/dL
Microalb/Creat Ratio: 10.7 mg/g creat (ref 0.0–30.0)
Microalbumin, Urine: 11.3 ug/mL

## 2016-09-19 LAB — FOLATE: Folate: >20 ng/mL (ref >3.0)

## 2016-09-19 LAB — T4, FREE: Free T4: 1.12 ng/dL (ref 0.82–1.77)

## 2016-09-19 LAB — INSULIN, RANDOM: INSULIN: 32.4 u[IU]/mL — ABNORMAL HIGH (ref 2.6–24.9)

## 2016-09-19 LAB — VITAMIN B12: Vitamin B-12: 758 pg/mL (ref 232–1245)

## 2016-09-19 LAB — VITAMIN D 25 HYDROXY (VIT D DEFICIENCY, FRACTURES): Vit D, 25-Hydroxy: 21.9 ng/mL — ABNORMAL LOW (ref 30.0–100.0)

## 2016-10-06 ENCOUNTER — Ambulatory Visit (INDEPENDENT_AMBULATORY_CARE_PROVIDER_SITE_OTHER): Payer: BLUE CROSS/BLUE SHIELD | Admitting: Family Medicine

## 2016-10-06 VITALS — BP 143/76 | HR 70 | Temp 98.3°F | Ht 68.0 in | Wt 264.0 lb

## 2016-10-06 DIAGNOSIS — E875 Hyperkalemia: Secondary | ICD-10-CM

## 2016-10-06 DIAGNOSIS — E559 Vitamin D deficiency, unspecified: Secondary | ICD-10-CM | POA: Diagnosis not present

## 2016-10-06 DIAGNOSIS — E119 Type 2 diabetes mellitus without complications: Secondary | ICD-10-CM

## 2016-10-06 DIAGNOSIS — Z9189 Other specified personal risk factors, not elsewhere classified: Secondary | ICD-10-CM

## 2016-10-06 MED ORDER — METFORMIN HCL 500 MG PO TABS: 500.0000 mg | ORAL_TABLET | Freq: Three times a day (TID) | ORAL | 0 refills | Status: DC

## 2016-10-06 MED ORDER — VITAMIN D (ERGOCALCIFEROL) 1.25 MG (50000 UNIT) PO CAPS: 50000.0000 [IU] | ORAL_CAPSULE | ORAL | 0 refills | Status: DC

## 2016-10-06 NOTE — Progress Notes (Signed)
Office: [phone]  /  Fax: [phone]   HPI:   Chief Complaint: OBESITY Steven Hodge is here to discuss his progress with his obesity treatment plan. He is following his eating plan approximately 90 % of the time and states he is exercising 0 minutes 0 times per week. Welby did well with category 3 plan but missed some breakfast options. Hunger was mostly controlled. He states he did well eating all his lean protein and vegetables. He noted significant sabotage at work. His weight is 264 lb (119.7 kg) today and has had a weight loss of 8 pounds over a period of 2 weeks since his last visit. He has lost 8 lbs since starting treatment with [name].  Vitamin D deficiency Steven Hodge has a diagnosis of vitamin D deficiency. He is on multi-vitamin and is not currently taking vit D, not yet at goal. He admits fatigue and denies nausea, vomiting or muscle weakness.  Diabetes II Steven Hodge has a diagnosis of diabetes type II. Fasting blood sugars between 136 and 177, Steven Hodge states BGs now range between 92 and 143 after starting the eating plan. He notes his morning blood sugars tend to be higher than his PM glucose levels.  Last A1c was at 6.7 He has been working on intensive lifestyle modifications including diet, exercise, and weight loss to help control his blood glucose levels.  Elevated Potassium Level Steven Hodge's potassium level is elevated at 5.5, he denies palpitations, medications or excessive potassium intake. Steven Hodge has normal renal function.  At risk for cardiovascular disease Steven Hodge is at a higher than average risk for cardiovascular disease due to obesity and diabetes. He currently denies any chest pain.  Wt Readings from Last 500 Encounters:  10/06/16 264 lb (119.7 kg)  09/18/16 272 lb (123.4 kg)  08/20/16 279 lb 6.4 oz (126.7 kg)  04/22/16 260 lb 9.6 oz (118.2 kg)  10/02/15 265 lb 3.2 oz (120.3 kg)  06/15/15 259 lb 3.2 oz (117.6 kg)  03/09/15 255 lb 12.8 oz (116 kg)  03/07/15 257 lb (116.6 kg)    03/05/15 259 lb 6.4 oz (117.7 kg)  03/04/15 265 lb (120.2 kg)  03/01/15 266 lb 3.2 oz (120.7 kg)  02/28/15 264 lb (119.7 kg)  02/27/15 265 lb 6.4 oz (120.4 kg)  01/25/13 (!) 303 lb 3.2 oz (137.5 kg)     ALLERGIES: No Known Allergies  MEDICATIONS: Current Outpatient Prescriptions on File Prior to Visit  Medication Sig Dispense Refill  . aspirin 325 MG tablet Take 325 mg by mouth daily.    [name] BAYER CONTOUR NEXT TEST test strip USE TO CHECK BLOOD SUGAR UP TO 4 TIMES ADAY 100 each 1  . blood glucose meter kit and supplies Dispense based on patient and insurance preference. Use up to four times daily as directed. (FOR ICD-9 250.00, 250.01). 1 each 0  . fish oil-omega-3 fatty acids 1000 MG capsule Take 2 g by mouth daily.    [name] glucosamine-chondroitin 500-400 MG tablet Take 1 tablet by mouth once.    [name] losartan-hydrochlorothiazide (HYZAAR) 50-12.5 MG tablet Take 1 tablet by mouth daily. 90 tablet 1  . Multiple Vitamin (MULTIVITAMIN) tablet Take 1 tablet by mouth daily.    . TURMERIC PO Take 1,000 mg by mouth 2 (two) times daily.     No current facility-administered medications on file prior to visit.     PAST MEDICAL HISTORY: Past Medical History:  Diagnosis Date  . Back pain   . Cancer ([place]) 06/09/2006   Basal cell carcinoma  scalp;   [name] Diabetes mellitus without complication ([place])   . Hx of blood clots   . Hypertension   . Joint pain   . Swelling     PAST SURGICAL HISTORY: Past Surgical History:  Procedure Laterality Date  . basel cell cancer removed  2009  . KNEE SURGERY  2005   ACL repair R    SOCIAL HISTORY: Social History  Substance Use Topics  . Smoking status: Former Research scientist (life sciences)  . Smokeless tobacco: Never Used  . Alcohol use No    FAMILY HISTORY: Family History  Problem Relation Age of Onset  . Diabetes Mother   . Liver disease Mother   . Obesity Mother   . Cancer Father 51    lung cancer  . Diabetes Father   . Cancer Sister 54    lung cancer  . Heart  disease Brother 67    cardiac stenting/CAD  . Cancer Brother     skin cancer    ROS: Review of Systems  Constitutional: Positive for malaise/fatigue and weight loss.  Cardiovascular: Negative for chest pain and palpitations.  Gastrointestinal: Negative for nausea and vomiting.  Musculoskeletal:       Negative muscle weakness    PHYSICAL EXAM: Blood pressure (!) 143/76, pulse 70, temperature 98.3 F (36.8 C), height _0  (1.727 m), weight 264 lb (119.7 kg), SpO2 97 %. Body mass index is 40.14 kg/m. Physical Exam  Constitutional: He is oriented to person, place, and time. He appears well-developed and well-nourished.  Cardiovascular: Normal rate.   Pulmonary/Chest: Effort normal.  Musculoskeletal: Normal range of motion.  Neurological: He is oriented to person, place, and time.  Skin: Skin is warm and dry.  Psychiatric: He has a normal mood and affect. His behavior is normal.  Vitals reviewed.   RECENT LABS AND TESTS: BMET    Component Value Date/Time   NA 143 09/18/2016 0956   K 5.5 (H) 09/18/2016 0956   CL 100 09/18/2016 0956   CO2 28 09/18/2016 0956   GLUCOSE 139 (H) 09/18/2016 0956   GLUCOSE 146 (H) 04/22/2016 1349   BUN 16 09/18/2016 0956   CREATININE 0.79 09/18/2016 0956   CREATININE 0.88 04/22/2016 1349   CALCIUM 9.5 09/18/2016 0956   GFRNONAA 103 09/18/2016 0956   GFRAA 119 09/18/2016 0956   Lab Results  Component Value Date   HGBA1C 6.7 (H) 09/18/2016   HGBA1C 6.7 (H) 08/20/2016   HGBA1C 6.1 04/22/2016   HGBA1C 6.3 10/02/2015   HGBA1C 5.9 06/15/2015   Lab Results  Component Value Date   INSULIN 32.4 (H) 09/18/2016   CBC    Component Value Date/Time   WBC 6.4 09/18/2016 0956   WBC 8.2 04/22/2016 1349   RBC 4.93 09/18/2016 0956   RBC 5.24 04/22/2016 1349   HGB 16.2 04/22/2016 1349   HCT 43.1 09/18/2016 0956   PLT 197 09/18/2016 0956   MCV 87 09/18/2016 0956   MCH 30.0 09/18/2016 0956   MCH 30.9 04/22/2016 1349   MCHC 34.3 09/18/2016 0956     MCHC 34.4 04/22/2016 1349   RDW 13.5 09/18/2016 0956   LYMPHSABS 2.1 09/18/2016 0956   MONOABS 656 04/22/2016 1349   EOSABS 0.2 09/18/2016 0956   BASOSABS 0.0 09/18/2016 0956   Iron/TIBC/Ferritin/ %Sat No results found for: IRON, TIBC, FERRITIN, IRONPCTSAT Lipid Panel     Component Value Date/Time   CHOL 195 09/18/2016 0956   TRIG 161 (H) 09/18/2016 0956   HDL 44 09/18/2016 0956   CHOLHDL  4.4 04/22/2016 1349   VLDL 30 04/22/2016 1349   LDLCALC 119 (H) 09/18/2016 0956   Hepatic Function Panel     Component Value Date/Time   PROT 6.9 09/18/2016 0956   ALBUMIN 4.4 09/18/2016 0956   AST 34 09/18/2016 0956   ALT 75 (H) 09/18/2016 0956   ALKPHOS 56 09/18/2016 0956   BILITOT 0.3 09/18/2016 0956      Component Value Date/Time   TSH 2.230 09/18/2016 0956   TSH 2.44 04/22/2016 1349   TSH 1.731 03/09/2015 1524    ASSESSMENT AND PLAN: Type 2 diabetes mellitus without complication, without long-term current use of insulin ([place]) - Plan: metFORMIN (GLUCOPHAGE) 500 MG tablet  Vitamin D deficiency - Plan: Vitamin D, Ergocalciferol, (DRISDOL) 50000 units CAPS capsule  Serum potassium elevated - Plan: Potassium  At risk for heart disease  Morbid obesity ([place])  PLAN:  Vitamin D Deficiency Steven Hodge was informed that low vitamin D levels contributes to fatigue and are associated with obesity, breast, and colon cancer. He agrees to start to take prescription Vit D _0 ,000 IU every week #4 with no refills and will follow up for routine testing of vitamin D, at least 2-3 times per year. He was informed of the risk of over-replacement of vitamin D and agrees to not increase his dose unless he discusses this with [name] first. Steven Hodge agrees to follow up with our clinic in 2 weeks.  Diabetes II Steven Hodge has been given extensive diabetes education by myself today including ideal fasting and post-prandial blood glucose readings, individual ideal Hgb A1c goals and hypoglycemia prevention. We discussed  the importance of good blood sugar control to decrease the likelihood of diabetic complications such as nephropathy, neuropathy, limb loss, blindness, coronary artery disease, and death. We discussed the importance of intensive lifestyle modification including diet, exercise and weight loss as the first line treatment for diabetes. Steven Hodge agrees to discontinue Glipizide and change Metformin to 500 mg tid #90 with no refills and will follow up at the agreed upon time.  Elevated Potassium Level Elevated potassium level is likely due to hemolysis. We will re-check potassium today and follow.  Cardiovascular risk counselling Steven Hodge was given extended (at least 30 minutes) coronary artery disease prevention counseling today. He is 56 y.o. male and has risk factors for heart disease including obesity and diabetes. We discussed intensive lifestyle modifications today with an emphasis on specific weight loss instructions and strategies. Pt was also informed of the importance of increasing exercise and decreasing saturated fats to help prevent heart disease.  Obesity Steven Hodge is currently in the action stage of change. As such, his goal is to continue with weight loss efforts He has agreed to follow the Category 3 plan +100 calories Steven Hodge has been instructed to work up to a goal of 150 minutes of combined cardio and strengthening exercise per week for weight loss and overall health benefits. We discussed the following Behavioral Modification Stratagies today: increasing lean protein intake, decreasing simple carbohydrates , increasing vegetables and avoiding temptations  Steven Hodge has agreed to follow up with our clinic in 2 weeks. He was informed of the importance of frequent follow up visits to maximize his success with intensive lifestyle modifications for his multiple health conditions.  I, Doreene Nest, am acting as scribe for Dennard Nip, MD  I have reviewed the above documentation for accuracy and  completeness, and I agree with the above. -Dennard Nip, MD

## 2016-10-07 LAB — POTASSIUM: Potassium: 4.1 mmol/L (ref 3.5–5.2)

## 2016-10-08 ENCOUNTER — Encounter (INDEPENDENT_AMBULATORY_CARE_PROVIDER_SITE_OTHER): Payer: Self-pay | Admitting: Family Medicine

## 2016-10-17 DIAGNOSIS — H5213 Myopia, bilateral: Secondary | ICD-10-CM | POA: Diagnosis not present

## 2016-10-17 DIAGNOSIS — E119 Type 2 diabetes mellitus without complications: Secondary | ICD-10-CM | POA: Diagnosis not present

## 2016-10-20 ENCOUNTER — Ambulatory Visit (INDEPENDENT_AMBULATORY_CARE_PROVIDER_SITE_OTHER): Payer: BLUE CROSS/BLUE SHIELD | Admitting: Family Medicine

## 2016-10-20 VITALS — BP 129/73 | HR 75 | Temp 97.9°F | Ht 68.0 in | Wt 265.0 lb

## 2016-10-20 DIAGNOSIS — E119 Type 2 diabetes mellitus without complications: Secondary | ICD-10-CM | POA: Diagnosis not present

## 2016-10-20 DIAGNOSIS — E559 Vitamin D deficiency, unspecified: Secondary | ICD-10-CM | POA: Diagnosis not present

## 2016-10-20 MED ORDER — LIRAGLUTIDE 18 MG/3ML ~~LOC~~ SOPN: 0.6000 mg | PEN_INJECTOR | Freq: Every morning | SUBCUTANEOUS | 0 refills | Status: DC

## 2016-10-20 MED ORDER — VITAMIN D (ERGOCALCIFEROL) 1.25 MG (50000 UNIT) PO CAPS: 50000.0000 [IU] | ORAL_CAPSULE | ORAL | 0 refills | Status: DC

## 2016-10-20 NOTE — Progress Notes (Signed)
Office: [phone]  /  Fax: [phone]   HPI:   Chief Complaint: OBESITY Steven Hodge is here to discuss his progress with his obesity treatment plan. He is on the  follow the Category 3 plan +100 calories and is following his eating plan approximately 100 % of the time. He states he is exercising 0 minutes 0 times per week. Steven Hodge  Has done well with weight loss, hunger is mostly controlled. Steven Hodge has had GI upset. His weight is 265 lb (120.2 kg) today and has had a weight gain of 1 pound over a period of 2 weeks since his last visit. He has lost 7 lbs since starting treatment with [name].  Vitamin D deficiency Steven Hodge has a diagnosis of vitamin D deficiency. He is currently stable on vit D and denies nausea, vomiting or muscle weakness.  Diabetes II Steven Hodge has a diagnosis of diabetes type II. Steven Hodge states BGs range between 92 and 139 on Metformin, not yet controlled and admits polyphagia. He has been working on intensive lifestyle modifications including diet, exercise, and weight loss to help control his blood glucose levels.  ALLERGIES: No Known Allergies  MEDICATIONS: Current Outpatient Prescriptions on File Prior to Visit  Medication Sig Dispense Refill  . aspirin 325 MG tablet Take 325 mg by mouth daily.    [name] BAYER CONTOUR NEXT TEST test strip USE TO CHECK BLOOD SUGAR UP TO 4 TIMES ADAY 100 each 1  . blood glucose meter kit and supplies Dispense based on patient and insurance preference. Use up to four times daily as directed. (FOR ICD-9 250.00, 250.01). 1 each 0  . fish oil-omega-3 fatty acids 1000 MG capsule Take 2 g by mouth daily.    [name] glucosamine-chondroitin 500-400 MG tablet Take 1 tablet by mouth once.    [name] losartan-hydrochlorothiazide (HYZAAR) 50-12.5 MG tablet Take 1 tablet by mouth daily. 90 tablet 1  . metFORMIN (GLUCOPHAGE) 500 MG tablet Take 1 tablet (500 mg total) by mouth 3 (three) times daily. 90 tablet 0  . Multiple Vitamin (MULTIVITAMIN) tablet Take 1 tablet by mouth  daily.    . TURMERIC PO Take 1,000 mg by mouth 2 (two) times daily.     No current facility-administered medications on file prior to visit.     PAST MEDICAL HISTORY: Past Medical History:  Diagnosis Date  . Back pain   . Cancer ([facility]) 06/09/2006   Basal cell carcinoma scalp;   [name] Diabetes mellitus without complication ([place])   . Hx of blood clots   . Hypertension   . Joint pain   . Swelling     PAST SURGICAL HISTORY: Past Surgical History:  Procedure Laterality Date  . basel cell cancer removed  2009  . KNEE SURGERY  2005   ACL repair R    SOCIAL HISTORY: Social History  Substance Use Topics  . Smoking status: Former Research scientist (life sciences)  . Smokeless tobacco: Never Used  . Alcohol use No    FAMILY HISTORY: Family History  Problem Relation Age of Onset  . Diabetes Mother   . Liver disease Mother   . Obesity Mother   . Cancer Father 43       lung cancer  . Diabetes Father   . Cancer Sister 64       lung cancer  . Heart disease Brother 33       cardiac stenting/CAD  . Cancer Brother        skin cancer    ROS: Review of Systems  Constitutional:  Negative for weight loss.  Gastrointestinal: Negative for nausea and vomiting.  Musculoskeletal:       Negative muscle weakness  Endo/Heme/Allergies:       Polyphagia    PHYSICAL EXAM: Blood pressure 129/73, pulse 75, temperature 97.9 F (36.6 C), temperature source Oral, height 5' 8" (1.727 m), weight 265 lb (120.2 kg), SpO2 97 %. Body mass index is 40.29 kg/m. Physical Exam  Constitutional: He is oriented to person, place, and time. He appears well-developed and well-nourished.  Cardiovascular: Normal rate.   Pulmonary/Chest: Effort normal.  Musculoskeletal: Normal range of motion.  Neurological: He is oriented to person, place, and time.  Skin: Skin is warm and dry.  Psychiatric: He has a normal mood and affect. His behavior is normal.  Vitals reviewed.   RECENT LABS AND TESTS: BMET    Component Value Date/Time    NA 143 09/18/2016 0956   K 4.1 10/06/2016 1150   CL 100 09/18/2016 0956   CO2 28 09/18/2016 0956   GLUCOSE 139 (H) 09/18/2016 0956   GLUCOSE 146 (H) 04/22/2016 1349   BUN 16 09/18/2016 0956   CREATININE 0.79 09/18/2016 0956   CREATININE 0.88 04/22/2016 1349   CALCIUM 9.5 09/18/2016 0956   GFRNONAA 103 09/18/2016 0956   GFRAA 119 09/18/2016 0956   Lab Results  Component Value Date   HGBA1C 6.7 (H) 09/18/2016   HGBA1C 6.7 (H) 08/20/2016   HGBA1C 6.1 04/22/2016   HGBA1C 6.3 10/02/2015   HGBA1C 5.9 06/15/2015   Lab Results  Component Value Date   INSULIN 32.4 (H) 09/18/2016   CBC    Component Value Date/Time   WBC 6.4 09/18/2016 0956   WBC 8.2 04/22/2016 1349   RBC 4.93 09/18/2016 0956   RBC 5.24 04/22/2016 1349   HGB 16.2 04/22/2016 1349   HCT 43.1 09/18/2016 0956   PLT 197 09/18/2016 0956   MCV 87 09/18/2016 0956   MCH 30.0 09/18/2016 0956   MCH 30.9 04/22/2016 1349   MCHC 34.3 09/18/2016 0956   MCHC 34.4 04/22/2016 1349   RDW 13.5 09/18/2016 0956   LYMPHSABS 2.1 09/18/2016 0956   MONOABS 656 04/22/2016 1349   EOSABS 0.2 09/18/2016 0956   BASOSABS 0.0 09/18/2016 0956   Iron/TIBC/Ferritin/ %Sat No results found for: IRON, TIBC, FERRITIN, IRONPCTSAT Lipid Panel     Component Value Date/Time   CHOL 195 09/18/2016 0956   TRIG 161 (H) 09/18/2016 0956   HDL 44 09/18/2016 0956   CHOLHDL 4.4 04/22/2016 1349   VLDL 30 04/22/2016 1349   LDLCALC 119 (H) 09/18/2016 0956   Hepatic Function Panel     Component Value Date/Time   PROT 6.9 09/18/2016 0956   ALBUMIN 4.4 09/18/2016 0956   AST 34 09/18/2016 0956   ALT 75 (H) 09/18/2016 0956   ALKPHOS 56 09/18/2016 0956   BILITOT 0.3 09/18/2016 0956      Component Value Date/Time   TSH 2.230 09/18/2016 0956   TSH 2.44 04/22/2016 1349   TSH 1.731 03/09/2015 1524    ASSESSMENT AND PLAN: Type 2 diabetes mellitus without complication, without long-term current use of insulin ([facility]) - Plan: liraglutide 18 MG/3ML  SOPN  Vitamin D deficiency - Plan: Vitamin D, Ergocalciferol, (DRISDOL) 50000 units CAPS capsule  Morbid obesity ([place])  PLAN:  Vitamin D Deficiency Steven Hodge was informed that low vitamin D levels contributes to fatigue and are associated with obesity, breast, and colon cancer. He agrees to continue to take prescription Vit D _0 ,000 IU every week, we will  refill for 1 month and will follow up for routine testing of vitamin D, at least 2-3 times per year. He was informed of the risk of over-replacement of vitamin D and agrees to not increase his dose unless he discusses this with [name] first. Steven Hodge agrees to follow up with our clinic in 2 weeks.  Diabetes II Steven Hodge has been given extensive diabetes education by myself today including ideal fasting and post-prandial blood glucose readings, individual ideal Hgb A1c goals  and hypoglycemia prevention. We discussed the importance of good blood sugar control to decrease the likelihood of diabetic complications such as nephropathy, neuropathy, limb loss, blindness, coronary artery disease, and death. We discussed the importance of intensive lifestyle modification including diet, exercise and weight loss as the first line treatment for diabetes. Steven Hodge agrees to continue his diabetes medications and start to take Victoza 0.6 mg every morning #1 pen with no refills and will follow up at the agreed upon time.  Obesity Steven Hodge is currently in the action stage of change. As such, his goal is to continue with weight loss efforts He has agreed to follow the Category 3 plan +100 calories Steven Hodge has been instructed to work up to a goal of 150 minutes of combined cardio and strengthening exercise per week for weight loss and overall health benefits. We discussed the following Behavioral Modification Strategies today: increasing lean protein intake and decreasing simple carbohydrates   Steven Hodge has agreed to follow up with our clinic in 2 weeks. He was informed of the  importance of frequent follow up visits to maximize his success with intensive lifestyle modifications for his multiple health conditions.  I, Doreene Nest, am acting as scribe for Dennard Nip, MD  I have reviewed the above documentation for accuracy and completeness, and I agree with the above. -Dennard Nip, MD

## 2016-11-04 ENCOUNTER — Ambulatory Visit (INDEPENDENT_AMBULATORY_CARE_PROVIDER_SITE_OTHER): Payer: BLUE CROSS/BLUE SHIELD | Admitting: Family Medicine

## 2016-11-04 VITALS — BP 126/75 | HR 87 | Temp 98.2°F | Ht 68.0 in | Wt 261.0 lb

## 2016-11-04 DIAGNOSIS — Z6839 Body mass index (BMI) 39.0-39.9, adult: Secondary | ICD-10-CM | POA: Diagnosis not present

## 2016-11-04 DIAGNOSIS — E669 Obesity, unspecified: Secondary | ICD-10-CM

## 2016-11-04 DIAGNOSIS — E119 Type 2 diabetes mellitus without complications: Secondary | ICD-10-CM

## 2016-11-05 NOTE — Progress Notes (Signed)
Office: [phone]  /  Fax: [phone]   HPI:   Chief Complaint: OBESITY Steven Hodge is here to discuss his progress with his obesity treatment plan. He is on the  follow the Category 3 plan and is following his eating plan approximately 100 % of the time. He states he is exercising 0 minutes 0 times per week. Steven Hodge is doing well on diet prescription and started Victoza for diabetes. Hunger is controlled and he is having a difficult time eating all his food. His weight is 261 lb (118.4 kg) today and has had a weight loss of 4 pounds over a period of 2 weeks since his last visit. He has lost 11 lbs since starting treatment with Steven Hodge.  Diabetes II Steven Hodge has a diagnosis of diabetes type II. He started Victoza and did well for the first few days, feels very full. Fasting blood sugars dropped from 165 to 120's and Steven Hodge denies any hypoglycemic episodes. He has been working on intensive lifestyle modifications including diet, exercise, and weight loss to help control his blood glucose levels.  ALLERGIES: No Known Allergies  MEDICATIONS: Current Outpatient Prescriptions on File Prior to Visit  Medication Sig Dispense Refill  . aspirin 325 MG tablet Take 325 mg by mouth daily.    [name] BAYER CONTOUR NEXT TEST test strip USE TO CHECK BLOOD SUGAR UP TO 4 TIMES ADAY 100 each 1  . blood glucose meter kit and supplies Dispense based on patient and insurance preference. Use up to four times daily as directed. (FOR ICD-9 250.00, 250.01). 1 each 0  . fish oil-omega-3 fatty acids 1000 MG capsule Take 2 g by mouth daily.    [name] glucosamine-chondroitin 500-400 MG tablet Take 1 tablet by mouth once.    . liraglutide 18 MG/3ML SOPN Inject 0.1 mLs (0.6 mg total) into the skin every morning. (Patient taking differently: Inject 0.3 mg into the skin 3 (three) times a week. ) 1 pen 0  . losartan-hydrochlorothiazide (HYZAAR) 50-12.5 MG tablet Take 1 tablet by mouth daily. 90 tablet 1  . metFORMIN (GLUCOPHAGE) 500 MG tablet  Take 1 tablet (500 mg total) by mouth 3 (three) times daily. 90 tablet 0  . Multiple Vitamin (MULTIVITAMIN) tablet Take 1 tablet by mouth daily.    . TURMERIC PO Take 1,000 mg by mouth 2 (two) times daily.    . Vitamin D, Ergocalciferol, (DRISDOL) 50000 units CAPS capsule Take 1 capsule (50,000 Units total) by mouth every 7 (seven) days. 4 capsule 0   No current facility-administered medications on file prior to visit.     PAST MEDICAL HISTORY: Past Medical History:  Diagnosis Date  . Back pain   . Cancer ([facility]) 06/09/2006   Basal cell carcinoma scalp;   [name] Diabetes mellitus without complication ([facility])   . Hx of blood clots   . Hypertension   . Joint pain   . Swelling     PAST SURGICAL HISTORY: Past Surgical History:  Procedure Laterality Date  . basel cell cancer removed  2009  . KNEE SURGERY  2005   ACL repair R    SOCIAL HISTORY: Social History  Substance Use Topics  . Smoking status: Former Research scientist (life sciences)  . Smokeless tobacco: Never Used  . Alcohol use No    FAMILY HISTORY: Family History  Problem Relation Age of Onset  . Diabetes Mother   . Liver disease Mother   . Obesity Mother   . Cancer Father 26       lung cancer  .  Diabetes Father   . Cancer Sister 52       lung cancer  . Heart disease Brother 49       cardiac stenting/CAD  . Cancer Brother        skin cancer    ROS: Review of Systems  Constitutional: Positive for weight loss.  Endo/Heme/Allergies:       Negative hypoglycemia    PHYSICAL EXAM: Blood pressure 126/75, pulse 87, temperature 98.2 F (36.8 C), temperature source Oral, height _0  (1.727 m), weight 261 lb (118.4 kg), SpO2 97 %. Body mass index is 39.68 kg/m. Physical Exam  Constitutional: He is oriented to person, place, and time. He appears well-developed and well-nourished.  Cardiovascular: Normal rate.   Pulmonary/Chest: Effort normal.  Musculoskeletal: Normal range of motion.  Neurological: He is oriented to person, place, and time.   Skin: Skin is warm and dry.  Psychiatric: He has a normal mood and affect. His behavior is normal.  Vitals reviewed.   RECENT LABS AND TESTS: BMET    Component Value Date/Time   NA 143 09/18/2016 0956   K 4.1 10/06/2016 1150   CL 100 09/18/2016 0956   CO2 28 09/18/2016 0956   GLUCOSE 139 (H) 09/18/2016 0956   GLUCOSE 146 (H) 04/22/2016 1349   BUN 16 09/18/2016 0956   CREATININE 0.79 09/18/2016 0956   CREATININE 0.88 04/22/2016 1349   CALCIUM 9.5 09/18/2016 0956   GFRNONAA 103 09/18/2016 0956   GFRAA 119 09/18/2016 0956   Lab Results  Component Value Date   HGBA1C 6.7 (H) 09/18/2016   HGBA1C 6.7 (H) 08/20/2016   HGBA1C 6.1 04/22/2016   HGBA1C 6.3 10/02/2015   HGBA1C 5.9 06/15/2015   Lab Results  Component Value Date   INSULIN 32.4 (H) 09/18/2016   CBC    Component Value Date/Time   WBC 6.4 09/18/2016 0956   WBC 8.2 04/22/2016 1349   RBC 4.93 09/18/2016 0956   RBC 5.24 04/22/2016 1349   HGB 16.2 04/22/2016 1349   HCT 43.1 09/18/2016 0956   PLT 197 09/18/2016 0956   MCV 87 09/18/2016 0956   MCH 30.0 09/18/2016 0956   MCH 30.9 04/22/2016 1349   MCHC 34.3 09/18/2016 0956   MCHC 34.4 04/22/2016 1349   RDW 13.5 09/18/2016 0956   LYMPHSABS 2.1 09/18/2016 0956   MONOABS 656 04/22/2016 1349   EOSABS 0.2 09/18/2016 0956   BASOSABS 0.0 09/18/2016 0956   Iron/TIBC/Ferritin/ %Sat No results found for: IRON, TIBC, FERRITIN, IRONPCTSAT Lipid Panel     Component Value Date/Time   CHOL 195 09/18/2016 0956   TRIG 161 (H) 09/18/2016 0956   HDL 44 09/18/2016 0956   CHOLHDL 4.4 04/22/2016 1349   VLDL 30 04/22/2016 1349   LDLCALC 119 (H) 09/18/2016 0956   Hepatic Function Panel     Component Value Date/Time   PROT 6.9 09/18/2016 0956   ALBUMIN 4.4 09/18/2016 0956   AST 34 09/18/2016 0956   ALT 75 (H) 09/18/2016 0956   ALKPHOS 56 09/18/2016 0956   BILITOT 0.3 09/18/2016 0956      Component Value Date/Time   TSH 2.230 09/18/2016 0956   TSH 2.44 04/22/2016 1349    TSH 1.731 03/09/2015 1524    ASSESSMENT AND PLAN: Type 2 diabetes mellitus without complication, without long-term current use of insulin ([facility])  Class 2 obesity without serious comorbidity with body mass index (BMI) of 39.0 to 39.9 in adult, unspecified obesity type  PLAN:  Diabetes II Steven Hodge has been given  extensive diabetes education by myself today including ideal fasting and post-prandial blood glucose readings, individual ideal Hgb A1c goals  and hypoglycemia prevention. We discussed the importance of good blood sugar control to decrease the likelihood of diabetic complications such as nephropathy, neuropathy, limb loss, blindness, coronary artery disease, and death. We discussed the importance of intensive lifestyle modification including diet, exercise and weight loss as the first line treatment for diabetes. Steven Hodge agrees to decease Victoza to 0.3 mg x 2 to 3 days and will follow up in 2 weeks. Steven Hodge agrees to continue diet, exercise and weight loss for now.  Obesity Steven Hodge is currently in the action stage of change. As such, his goal is to continue with weight loss efforts He has agreed to follow the Category 3 plan Steven Hodge has been instructed to work up to a goal of 150 minutes of combined cardio and strengthening exercise per week for weight loss and overall health benefits. We discussed the following Behavioral Modification Strategies today: increasing lean protein intake  [place] has agreed to follow up with our clinic in 2 weeks. He was informed of the importance of frequent follow up visits to maximize his success with intensive lifestyle modifications for his multiple health conditions.  I, Doreene Nest, am acting as scribe for Dennard Nip, MD  I have reviewed the above documentation for accuracy and completeness, and I agree with the above. -Dennard Nip, MD  OBESITY BEHAVIORAL INTERVENTION VISIT  Today's visit was # 4 out of 22.  Starting weight: 272 lbs Starting  date: 09/18/16 Today's weight : 261 lbs Today's date: 11/04/2016 Total lbs lost to date: 11 (Patients must lose 7 lbs in the first 6 months to continue with counseling)   ASK: We discussed the diagnosis of obesity with [name] today and [name] agreed to give Steven Hodge permission to discuss obesity behavioral modification therapy today.  ASSESS: Welles has the diagnosis of obesity and his BMI today is 39.8 [place] is in the action stage of change   ADVISE: Atari was educated on the multiple health risks of obesity as well as the benefit of weight loss to improve his health. He was advised of the need for long term treatment and the importance of lifestyle modifications.  AGREE: Multiple dietary modification options and treatment options were discussed and  [place] agreed to follow the Category 3 plan We discussed the following Behavioral Modification Strategies today: increasing lean protein intake

## 2016-11-18 ENCOUNTER — Ambulatory Visit (INDEPENDENT_AMBULATORY_CARE_PROVIDER_SITE_OTHER): Payer: BLUE CROSS/BLUE SHIELD | Admitting: Family Medicine

## 2016-11-18 VITALS — BP 114/72 | HR 73 | Temp 98.4°F | Ht 68.0 in | Wt 261.0 lb

## 2016-11-18 DIAGNOSIS — E119 Type 2 diabetes mellitus without complications: Secondary | ICD-10-CM | POA: Diagnosis not present

## 2016-11-18 DIAGNOSIS — Z6839 Body mass index (BMI) 39.0-39.9, adult: Secondary | ICD-10-CM

## 2016-11-18 DIAGNOSIS — E669 Obesity, unspecified: Secondary | ICD-10-CM

## 2016-11-18 DIAGNOSIS — E559 Vitamin D deficiency, unspecified: Secondary | ICD-10-CM

## 2016-11-18 HISTORY — DX: Vitamin D deficiency, unspecified: E55.9

## 2016-11-18 MED ORDER — VITAMIN D (ERGOCALCIFEROL) 1.25 MG (50000 UNIT) PO CAPS: 50000.0000 [IU] | ORAL_CAPSULE | ORAL | 0 refills | Status: DC

## 2016-11-19 ENCOUNTER — Ambulatory Visit: Payer: BLUE CROSS/BLUE SHIELD | Admitting: Family Medicine

## 2016-11-19 NOTE — Progress Notes (Signed)
Office: [phone]  /  Fax: [phone]   HPI:   Chief Complaint: OBESITY Steven Hodge is here to discuss his progress with his obesity treatment plan. He is on the  follow the Category 3 plan and is following his eating plan approximately 100 % of the time. He states he is exercising 0 minutes 0 times per week. Steven Hodge has done well maintaining weight. He has had some increased celebration eating and hunger is better controlled. He is retaining some fluid today. His weight is 261 lb (118.4 kg) today and has maintained weight over a period of 2 weeks since his last visit. He has lost 11 lbs since starting treatment with Steven Hodge.  Vitamin D deficiency Steven Hodge has a diagnosis of vitamin D deficiency. He is currently stable on vit D. Fatigue is improving and he denies vomiting or muscle weakness.  Diabetes II Steven Hodge has a diagnosis of diabetes type II. Steven Hodge stayed on victoza at 0.6 mg and fasting blood sugars range between 120 and 130's, nausea is decreased and he is feeling better now.  He has been working on intensive lifestyle modifications including diet, exercise, and weight loss to help control his blood glucose levels.  ALLERGIES: No Known Allergies  MEDICATIONS: Current Outpatient Prescriptions on File Prior to Visit  Medication Sig Dispense Refill  . aspirin 325 MG tablet Take 325 mg by mouth daily.    [name] BAYER CONTOUR NEXT TEST test strip USE TO CHECK BLOOD SUGAR UP TO 4 TIMES ADAY 100 each 1  . blood glucose meter kit and supplies Dispense based on patient and insurance preference. Use up to four times daily as directed. (FOR ICD-9 250.00, 250.01). 1 each 0  . fish oil-omega-3 fatty acids 1000 MG capsule Take 2 g by mouth daily.    [name] glucosamine-chondroitin 500-400 MG tablet Take 1 tablet by mouth once.    . liraglutide 18 MG/3ML SOPN Inject 0.1 mLs (0.6 mg total) into the skin every morning. (Patient taking differently: Inject 0.3 mg into the skin 3 (three) times a week. ) 1 pen 0  .  losartan-hydrochlorothiazide (HYZAAR) 50-12.5 MG tablet Take 1 tablet by mouth daily. 90 tablet 1  . metFORMIN (GLUCOPHAGE) 500 MG tablet Take 1 tablet (500 mg total) by mouth 3 (three) times daily. 90 tablet 0  . Multiple Vitamin (MULTIVITAMIN) tablet Take 1 tablet by mouth daily.    . TURMERIC PO Take 1,000 mg by mouth 2 (two) times daily.     No current facility-administered medications on file prior to visit.     PAST MEDICAL HISTORY: Past Medical History:  Diagnosis Date  . Back pain   . Cancer ([place]) 06/09/2006   Basal cell carcinoma scalp;   [name] Diabetes mellitus without complication ([place])   . Hx of blood clots   . Hypertension   . Joint pain   . Swelling     PAST SURGICAL HISTORY: Past Surgical History:  Procedure Laterality Date  . basel cell cancer removed  2009  . KNEE SURGERY  2005   ACL repair R    SOCIAL HISTORY: Social History  Substance Use Topics  . Smoking status: Former Research scientist (life sciences)  . Smokeless tobacco: Never Used  . Alcohol use No    FAMILY HISTORY: Family History  Problem Relation Age of Onset  . Diabetes Mother   . Liver disease Mother   . Obesity Mother   . Cancer Father 29       lung cancer  . Diabetes Father   .  Cancer Sister 67       lung cancer  . Heart disease Brother 15       cardiac stenting/CAD  . Cancer Brother        skin cancer    ROS: Review of Systems  Constitutional: Positive for malaise/fatigue. Negative for weight loss.  Gastrointestinal: Positive for nausea. Negative for vomiting.  Musculoskeletal:       Negative muscle weakness    PHYSICAL EXAM: Blood pressure 114/72, pulse 73, temperature 98.4 F (36.9 C), temperature source Oral, height '5\' 8"'  (1.727 m), weight 261 lb (118.4 kg), SpO2 100 %. Body mass index is 39.68 kg/m. Physical Exam  Constitutional: He is oriented to person, place, and time. He appears well-developed and well-nourished.  Cardiovascular: Normal rate.   Pulmonary/Chest: Effort normal.    Musculoskeletal: Normal range of motion.  Neurological: He is oriented to person, place, and time.  Skin: Skin is warm and dry.  Psychiatric: He has a normal mood and affect. His behavior is normal.  Vitals reviewed.   RECENT LABS AND TESTS: BMET    Component Value Date/Time   NA 143 09/18/2016 0956   K 4.1 10/06/2016 1150   CL 100 09/18/2016 0956   CO2 28 09/18/2016 0956   GLUCOSE 139 (H) 09/18/2016 0956   GLUCOSE 146 (H) 04/22/2016 1349   BUN 16 09/18/2016 0956   CREATININE 0.79 09/18/2016 0956   CREATININE 0.88 04/22/2016 1349   CALCIUM 9.5 09/18/2016 0956   GFRNONAA 103 09/18/2016 0956   GFRAA 119 09/18/2016 0956   Lab Results  Component Value Date   HGBA1C 6.7 (H) 09/18/2016   HGBA1C 6.7 (H) 08/20/2016   HGBA1C 6.1 04/22/2016   HGBA1C 6.3 10/02/2015   HGBA1C 5.9 06/15/2015   Lab Results  Component Value Date   INSULIN 32.4 (H) 09/18/2016   CBC    Component Value Date/Time   WBC 6.4 09/18/2016 0956   WBC 8.2 04/22/2016 1349   RBC 4.93 09/18/2016 0956   RBC 5.24 04/22/2016 1349   HGB 14.8 09/18/2016 0956   HCT 43.1 09/18/2016 0956   PLT 197 09/18/2016 0956   MCV 87 09/18/2016 0956   MCH 30.0 09/18/2016 0956   MCH 30.9 04/22/2016 1349   MCHC 34.3 09/18/2016 0956   MCHC 34.4 04/22/2016 1349   RDW 13.5 09/18/2016 0956   LYMPHSABS 2.1 09/18/2016 0956   MONOABS 656 04/22/2016 1349   EOSABS 0.2 09/18/2016 0956   BASOSABS 0.0 09/18/2016 0956   Iron/TIBC/Ferritin/ %Sat No results found for: IRON, TIBC, FERRITIN, IRONPCTSAT Lipid Panel     Component Value Date/Time   CHOL 195 09/18/2016 0956   TRIG 161 (H) 09/18/2016 0956   HDL 44 09/18/2016 0956   CHOLHDL 4.4 04/22/2016 1349   VLDL 30 04/22/2016 1349   LDLCALC 119 (H) 09/18/2016 0956   Hepatic Function Panel     Component Value Date/Time   PROT 6.9 09/18/2016 0956   ALBUMIN 4.4 09/18/2016 0956   AST 34 09/18/2016 0956   ALT 75 (H) 09/18/2016 0956   ALKPHOS 56 09/18/2016 0956   BILITOT 0.3  09/18/2016 0956      Component Value Date/Time   TSH 2.230 09/18/2016 0956   TSH 2.44 04/22/2016 1349   TSH 1.731 03/09/2015 1524    ASSESSMENT AND PLAN: Type 2 diabetes mellitus without complication, without long-term current use of insulin (HCC)  Vitamin D deficiency - Plan: Vitamin D, Ergocalciferol, (DRISDOL) 50000 units CAPS capsule  Class 2 obesity without serious comorbidity with  body mass index (BMI) of 39.0 to 39.9 in adult, unspecified obesity type  PLAN:  Vitamin D Deficiency Steven Hodge was informed that low vitamin D levels contributes to fatigue and are associated with obesity, breast, and colon cancer. He agrees to continue to take prescription Vit D '@50' ,000 IU every week, we will refill for 1 month and will follow up for routine testing of vitamin D, at least 2-3 times per year. He was informed of the risk of over-replacement of vitamin D and agrees to not increase his dose unless he discusses this with Steven Hodge first. We will re-check labs in 1 month and Steven Hodge agrees to follow up with our clinic in 3 weeks.  Diabetes II Steven Hodge has been given extensive diabetes education by myself today including ideal fasting and post-prandial blood glucose readings, individual ideal Hgb A1c goals  and hypoglycemia prevention. We discussed the importance of good blood sugar control to decrease the likelihood of diabetic complications such as nephropathy, neuropathy, limb loss, blindness, coronary artery disease, and death. We discussed the importance of intensive lifestyle modification including diet, exercise and weight loss as the first line treatment for diabetes. Steven Hodge agrees to continue with diet, exercise and weight loss and he agrees to continue victoza 0.6 mg and will follow up at the agreed upon time. We will re-check labs in 1 month.  Obesity Steven Hodge is currently in the action stage of change. As such, his goal is to continue with weight loss efforts He has agreed to follow the Category 3  plan Steven Hodge has been instructed to work up to a goal of 150 minutes of combined cardio and strengthening exercise per week for weight loss and overall health benefits. We discussed the following Behavioral Modification Strategies today: increase H2O intake, increasing lean protein intake, decreasing sodium intake and decrease eating out  Steven Hodge has agreed to follow up with our clinic in 3 weeks. He was informed of the importance of frequent follow up visits to maximize his success with intensive lifestyle modifications for his multiple health conditions.  I, Doreene Nest, am acting as scribe for Dennard Nip, MD  I have reviewed the above documentation for accuracy and completeness, and I agree with the above. -Dennard Nip, MD  OBESITY BEHAVIORAL INTERVENTION VISIT  Today's visit was # 5 out of 22.  Starting weight: 272 lbs Starting date: 09/18/16 Today's weight : 261 lbs Today's date: 11/18/2016 Total lbs lost to date: 11 (Patients must lose 7 lbs in the first 6 months to continue with counseling)   ASK: We discussed the diagnosis of obesity with Steven Hodge today and Steven Hodge agreed to give Steven Hodge permission to discuss obesity behavioral modification therapy today.  ASSESS: Steven Hodge has the diagnosis of obesity and his BMI today is '@TBMI' @ Steven Hodge is in the action stage of change   ADVISE: Steven Hodge was educated on the multiple health risks of obesity as well as the benefit of weight loss to improve his health. He was advised of the need for long term treatment and the importance of lifestyle modifications.  AGREE: Multiple dietary modification options and treatment options were discussed and  Steven Hodge agreed to follow the Category 3 plan We discussed the following Behavioral Modification Strategies today: increase H2O intake, increasing lean protein intake, decreasing sodium intake and decrease eating out

## 2016-11-24 ENCOUNTER — Ambulatory Visit (INDEPENDENT_AMBULATORY_CARE_PROVIDER_SITE_OTHER): Payer: BLUE CROSS/BLUE SHIELD

## 2016-11-24 ENCOUNTER — Encounter: Payer: Self-pay | Admitting: Family Medicine

## 2016-11-24 ENCOUNTER — Ambulatory Visit (INDEPENDENT_AMBULATORY_CARE_PROVIDER_SITE_OTHER): Payer: BLUE CROSS/BLUE SHIELD | Admitting: Family Medicine

## 2016-11-24 VITALS — BP 127/77 | HR 80 | Temp 98.0°F | Resp 18 | Ht 68.9 in | Wt 266.0 lb

## 2016-11-24 DIAGNOSIS — M25522 Pain in left elbow: Secondary | ICD-10-CM

## 2016-11-24 DIAGNOSIS — M7989 Other specified soft tissue disorders: Secondary | ICD-10-CM | POA: Diagnosis not present

## 2016-11-24 DIAGNOSIS — M25422 Effusion, left elbow: Secondary | ICD-10-CM

## 2016-11-24 DIAGNOSIS — I10 Essential (primary) hypertension: Secondary | ICD-10-CM | POA: Diagnosis not present

## 2016-11-24 DIAGNOSIS — E119 Type 2 diabetes mellitus without complications: Secondary | ICD-10-CM

## 2016-11-24 DIAGNOSIS — L03114 Cellulitis of left upper limb: Secondary | ICD-10-CM | POA: Diagnosis not present

## 2016-11-24 LAB — POCT CBC
Granulocyte percent: 73.2 %G (ref 37–80)
HCT, POC: 42.2 % — AB (ref 43.5–53.7)
Hemoglobin: 14.8 g/dL (ref 14.1–18.1)
Lymph, poc: 2.6 (ref 0.6–3.4)
MCH, POC: 31.3 pg — AB (ref 27–31.2)
MCHC: 35.1 g/dL (ref 31.8–35.4)
MCV: 89.2 fL (ref 80–97)
MID (cbc): 0.5 (ref 0–0.9)
MPV: 8.9 fL (ref 0–99.8)
POC Granulocyte: 8.6 — AB (ref 2–6.9)
POC LYMPH PERCENT: 22.2 %L (ref 10–50)
POC MID %: 4.6 %M (ref 0–12)
Platelet Count, POC: 191 10*3/uL (ref 142–424)
RBC: 4.73 M/uL (ref 4.69–6.13)
RDW, POC: 12.7 %
WBC: 11.8 10*3/uL — AB (ref 4.6–10.2)

## 2016-11-24 MED ORDER — DOXYCYCLINE HYCLATE 100 MG PO TABS: 100.0000 mg | ORAL_TABLET | Freq: Two times a day (BID) | ORAL | 0 refills | Status: DC

## 2016-11-24 MED ORDER — CEFTRIAXONE SODIUM 1 G IJ SOLR
1.0000 g | Freq: Once | INTRAMUSCULAR | Status: AC
  Administered 2016-11-24: 1 g via INTRAMUSCULAR

## 2016-11-24 NOTE — Patient Instructions (Addendum)
Your x-ray did not show any swelling or fluid within the elbow joint itself. The swelling appears to be at the level of the skin, and appears to be a cellulitis at this time. Antibiotic injection given today, but start antibiotic pills as well and recheck tomorrow for repeat exam. I will outline the area of redness today so it is easier to check for changes tomorrow.   I did check a gout tests, but I agree that this does not necessarily look like gout at this time.  Return to the clinic or go to the nearest emergency room if any of your symptoms worsen or new symptoms occur.   Cellulitis, Adult Cellulitis is a skin infection. The infected area is usually red and tender. This condition occurs most often in the arms and lower legs. The infection can travel to the muscles, blood, and underlying tissue and become serious. It is very important to get treated for this condition. What are the causes? Cellulitis is caused by bacteria. The bacteria enter through a break in the skin, such as a cut, burn, insect bite, open sore, or crack. What increases the risk? This condition is more likely to occur in people who:  Have a weak defense system (immune system).  Have open wounds on the skin such as cuts, burns, bites, and scrapes. Bacteria can enter the body through these open wounds.  Are older.  Have diabetes.  Have a type of long-lasting (chronic) liver disease (cirrhosis) or kidney disease.  Use IV drugs.  What are the signs or symptoms? Symptoms of this condition include:  Redness, streaking, or spotting on the skin.  Swollen area of the skin.  Tenderness or pain when an area of the skin is touched.  Warm skin.  Fever.  Chills.  Blisters.  How is this diagnosed? This condition is diagnosed based on a medical history and physical exam. You may also have tests, including:  Blood tests.  Lab tests.  Imaging tests.  How is this treated? Treatment for this condition may  include:  Medicines, such as antibiotic medicines or antihistamines.  Supportive care, such as rest and application of cold or warm cloths (cold or warm compresses) to the skin.  Hospital care, if the condition is severe.  The infection usually gets better within 1-2 days of treatment. Follow these instructions at home:  Take over-the-counter and prescription medicines only as told by your health care provider.  If you were prescribed an antibiotic medicine, take it as told by your health care provider. Do not stop taking the antibiotic even if you start to feel better.  Drink enough fluid to keep your urine clear or pale yellow.  Do not touch or rub the infected area.  Raise (elevate) the infected area above the level of your heart while you are sitting or lying down.  Apply warm or cold compresses to the affected area as told by your health care provider.  Keep all follow-up visits as told by your health care provider. This is important. These visits let your health care provider make sure a more serious infection is not developing. Contact a health care provider if:  You have a fever.  Your symptoms do not improve within 1-2 days of starting treatment.  Your bone or joint underneath the infected area becomes painful after the skin has healed.  Your infection returns in the same area or another area.  You notice a swollen bump in the infected area.  You develop new symptoms.  You have a general ill feeling (malaise) with muscle aches and pains. Get help right away if:  Your symptoms get worse.  You feel very sleepy.  You develop vomiting or diarrhea that persists.  You notice red streaks coming from the infected area.  Your red area gets larger or turns dark in color. This information is not intended to replace advice given to you by your health care provider. Make sure you discuss any questions you have with your health care provider. Document Released:  03/05/2005 Document Revised: 10/04/2015 Document Reviewed: 04/04/2015 Elsevier Interactive Patient Education  2017 [employer].   IF you received an x-ray today, you will receive an invoice from [facility]. Please contact [facility] at [phone] with questions or concerns regarding your invoice.   IF you received labwork today, you will receive an invoice from [facility]. Please contact [facility] at [phone] with questions or concerns regarding your invoice.   Our billing staff will not be able to assist you with questions regarding bills from these companies.  You will be contacted with the lab results as soon as they are available. The fastest way to get your results is to activate your My Chart account. Instructions are located on the last page of this paperwork. If you have not heard from [name] regarding the results in 2 weeks, please contact this office.

## 2016-11-24 NOTE — Progress Notes (Signed)
Subjective:  By signing my name below, I, Essence Howell, attest that this documentation has been prepared under the direction and in the presence of Delman Cheadle, MD Electronically Signed: Ladene Artist, ED Scribe 11/24/2016 at 11:35 AM.   Patient ID: [name], male    DOB: 01-22-1961, 56 y.o.   MRN: [id number]  Chief Complaint  Patient presents with  . Joint Swelling    left elbow x 6 days with pain.    HPI IZRAEL PEAK is a 56 y.o. male who presents to Primary Care at [facility] complaining of left elbow swelling x 6 days. H/o DM controlled with A1C of 6.7 in April. Pt states that symptoms began as more sensitivity to the olecranon the night prior to lifting hay but he denies injury. He reports increased left elbow pain with bending and straightening. Pt reports stiffness and warmth to the left elbow as well as chills onset 12 AM yesterday. Pt denies fever. He reports a h/o gout in the same elbow 7-8 years ago due to increased activity in the past without an uric acid reading. Pt is followed by Dr. Leafy Ro for DM management. He reports CBGs of 122 in the morning and 100 during the day.   Patient Active Problem List   Diagnosis Date Noted  . Vitamin D deficiency 11/18/2016  . Class 2 obesity without serious comorbidity with body mass index (BMI) of 39.0 to 39.9 in adult 11/04/2016  . Type 2 diabetes mellitus without complication, without long-term current use of insulin ([place]) 09/18/2016  . Essential hypertension 09/18/2016  . Morbid obesity ([place]) 08/20/2016  . History of colonic polyps 08/20/2016  . Type 2 diabetes mellitus with complication ([place]) 16/12/3708   Past Medical History:  Diagnosis Date  . Back pain   . Cancer ([place]) 06/09/2006   Basal cell carcinoma scalp;   [name] Diabetes mellitus without complication ([place])   . Hx of blood clots   . Hypertension   . Joint pain   . Swelling    Past Surgical History:  Procedure Laterality Date  . basel cell cancer removed  2009  . KNEE  SURGERY  2005   ACL repair R   No Known Allergies Prior to Admission medications   Medication Sig Start Date End Date Taking? Authorizing Provider  aspirin 325 MG tablet Take 325 mg by mouth daily.    [provider]  BAYER CONTOUR NEXT TEST test strip USE TO CHECK BLOOD SUGAR UP TO 4 TIMES ADAY 07/25/16   Wardell Honour, MD  blood glucose meter kit and supplies Dispense based on patient and insurance preference. Use up to four times daily as directed. (FOR ICD-9 250.00, 250.01). 02/28/15   Posey Boyer, MD  fish oil-omega-3 fatty acids 1000 MG capsule Take 2 g by mouth daily.    [provider]  glucosamine-chondroitin 500-400 MG tablet Take 1 tablet by mouth once.    [provider]  liraglutide 18 MG/3ML SOPN Inject 0.1 mLs (0.6 mg total) into the skin every morning. Patient taking differently: Inject 0.3 mg into the skin 3 (three) times a week.  10/20/16   Dennard Nip D, MD  losartan-hydrochlorothiazide (HYZAAR) 50-12.5 MG tablet Take 1 tablet by mouth daily. 08/23/16   Wardell Honour, MD  metFORMIN (GLUCOPHAGE) 500 MG tablet Take 1 tablet (500 mg total) by mouth 3 (three) times daily. 10/06/16   Dennard Nip D, MD  Multiple Vitamin (MULTIVITAMIN) tablet Take 1 tablet by mouth daily.  [provider]  TURMERIC PO Take 1,000 mg by mouth 2 (two) times daily.    [provider]  Vitamin D, Ergocalciferol, (DRISDOL) 50000 units CAPS capsule Take 1 capsule (50,000 Units total) by mouth every 7 (seven) days. 11/18/16   Starlyn Skeans, MD   Social History   Social History  . Marital status: Married    Spouse name: N/A  . Number of children: N/A  . Years of education: N/A   Occupational History  . [employer]issor Sales executive, Dealer    Social History Main Topics  . Smoking status: Former Research scientist (life sciences)  . Smokeless tobacco: Never Used  . Alcohol use No  . Drug use: No  . Sexual activity: Yes   Other Topics Concern  . 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 Negative for fever.  Musculoskeletal: Positive for arthralgias and joint swelling.      Objective:   Physical Exam  Constitutional: He is oriented to person, place, and time. He appears well-developed and well-nourished. No distress.  HENT:  Head: Normocephalic and atraumatic.  Eyes: Conjunctivae and EOM are normal.  Neck: Neck supple. No tracheal deviation present.  Cardiovascular: Normal rate.   Pulmonary/Chest: Effort normal. No respiratory distress.  Musculoskeletal: Normal range of motion.  Few healed abrasions to the upper aspect of elbow extensor surface. Some warmth and erythema extending from the lower aspect of his upper arm to the elbow and slightly to the forearm. Olecranon bursa appears to overall be flat but there is some thickening and edema diffusely to the extensor aspect of the elbow. Flexion to 90 degrees, lax 10-15 degrees extension. Healing bruise along the L mid forearm. Faint erythema into the antecubital area of the L elbow. Tender along the olecranon. Does appear to have some swelling within the elbow itself with some extension within the upper forearm. NVI distally to the fingertips. Outlined the erythema which is approximately 15 x 18 cm.  Neurological: He is alert and oriented to person, place, and time.  Skin: Skin is warm and dry.  Psychiatric: He has a normal mood and affect. His behavior is normal.  Nursing note and vitals reviewed.  Vitals:   11/24/16 1128  BP: 127/77  Pulse: 80  Resp: 18  Temp: 98 F (36.7 C)  TempSrc: Oral  SpO2: 98%  Weight: 266 lb (120.7 kg)  Height: 5' 8.9" (1.75 m)   Results for orders  placed or performed in visit on 11/24/16  POCT CBC  Result Value Ref Range   WBC 11.8 (A) 4.6 - 10.2 K/uL   Lymph, poc 2.6 0.6 - 3.4   POC LYMPH PERCENT 22.2 10 - 50 %L   MID (cbc) 0.5 0 - 0.9   POC MID % 4.6 0 - 12 %M   POC Granulocyte 8.6 (A) 2 - 6.9   Granulocyte percent 73.2 37 - 80 %G   RBC 4.73 4.69 - 6.13 M/uL   Hemoglobin  14.8 14.1 - 18.1 g/dL   HCT, POC 42.2 (A) 43.5 - 53.7 %   MCV 89.2 80 - 97 fL   MCH, POC 31.3 (A) 27 - 31.2 pg   MCHC 35.1 31.8 - 35.4 g/dL   RDW, POC 12.7 %   Platelet Count, POC 191 142 - 424 K/uL   MPV 8.9 0 - 99.8 fL   Dg Elbow Complete Left (3+view)  Result Date: 11/24/2016 CLINICAL DATA:  LEFT elbow pain and swelling with redness for 6 days, history of gout in same elbow 7-8 years ago, diabetes mellitus, hypertension EXAM: LEFT ELBOW - COMPLETE 3+ VIEW COMPARISON:  None FINDINGS: Quantum mottling artifacts present. Osseous mineralization normal. Joint spaces preserved. Large olecranon spur. Significant soft tissue swelling dorsally from distal upper arm through proximal forearm. No acute fracture, dislocation, or bone destruction. No elbow joint effusion. IMPRESSION: Olecranon spur. Dorsal soft tissue swelling without definite acute bony abnormalities. Electronically Signed   By: Lavonia Dana M.D.   On: 11/24/2016 12:11      Assessment & Plan:    SOHRAB KEELAN is a 56 y.o. male Left elbow pain Swelling of left elbow -, Cellulitis of left elbow - Plan: cefTRIAXone (ROCEPHIN) injection 1 g, doxycycline (VIBRA-TABS) 100 MG tablet  - No effusion seen in joint. Suspect restrictions in motion are due to discomfort at skin/soft tissue with cellulitis.  -Erythema outlined, start Rocephin 1 g, doxycycline 100 mg twice a day. Unfortunately there was no discharge/exudate for culture. Recheck in 24 hours with Jaynee Eagles, PA-C. Type 2 diabetes mellitus without complication, without long-term current use of insulin (HCC)  -Increased risk of infection with diabetes,  but most recently appear to be controlled. Continue routine follow-up with Dr. Leafy Ro  Meds ordered this encounter  Medications  . cefTRIAXone (ROCEPHIN) injection 1 g  . doxycycline (VIBRA-TABS) 100 MG tablet    Sig: Take 1 tablet (100 mg total) by mouth 2 (two) times daily.    Dispense:  20 tablet    Refill:  0   Patient Instructions   Your x-ray did not show any swelling or fluid within the elbow joint itself. The swelling appears to be at the level of the skin, and appears to be a cellulitis at this time. Antibiotic injection given today, but start antibiotic pills as well and recheck tomorrow for repeat exam. I will outline the area of redness today so it is easier to check for changes tomorrow.   I did check a gout tests, but I agree that this does not necessarily look like gout at this time.  Return to the clinic or go to the nearest emergency room if any of your symptoms worsen or new symptoms occur.   Cellulitis, Adult Cellulitis is a skin infection. The infected area is usually red and tender. This condition occurs most often in the arms and lower legs. The infection can travel to the muscles, blood, and underlying tissue and become serious. It is very important to get treated for this condition. What are the causes? Cellulitis is caused by bacteria. The bacteria enter through a break in the skin, such as a cut, burn, insect bite, open sore, or crack. What increases the risk? This condition is more likely to occur in people who:  Have a weak defense system (immune system).  Have open wounds on the skin such as cuts, burns, bites, and scrapes. Bacteria can enter the body through these open wounds.  Are older.  Have diabetes.  Have  a type of long-lasting (chronic) liver disease (cirrhosis) or kidney disease.  Use IV drugs.  What are the signs or symptoms? Symptoms of this condition include:  Redness, streaking, or spotting on the skin.  Swollen area of the  skin.  Tenderness or pain when an area of the skin is touched.  Warm skin.  Fever.  Chills.  Blisters.  How is this diagnosed? This condition is diagnosed based on a medical history and physical exam. You may also have tests, including:  Blood tests.  Lab tests.  Imaging tests.  How is this treated? Treatment for this condition may include:  Medicines, such as antibiotic medicines or antihistamines.  Supportive care, such as rest and application of cold or warm cloths (cold or warm compresses) to the skin.  Hospital care, if the condition is severe.  The infection usually gets better within 1-2 days of treatment. Follow these instructions at home:  Take over-the-counter and prescription medicines only as told by your health care provider.  If you were prescribed an antibiotic medicine, take it as told by your health care provider. Do not stop taking the antibiotic even if you start to feel better.  Drink enough fluid to keep your urine clear or pale yellow.  Do not touch or rub the infected area.  Raise (elevate) the infected area above the level of your heart while you are sitting or lying down.  Apply warm or cold compresses to the affected area as told by your health care provider.  Keep all follow-up visits as told by your health care provider. This is important. These visits let your health care provider make sure a more serious infection is not developing. Contact a health care provider if:  You have a fever.  Your symptoms do not improve within 1-2 days of starting treatment.  Your bone or joint underneath the infected area becomes painful after the skin has healed.  Your infection returns in the same area or another area.  You notice a swollen bump in the infected area.  You develop new symptoms.  You have a general ill feeling (malaise) with muscle aches and pains. Get help right away if:  Your symptoms get worse.  You feel very sleepy.  You  develop vomiting or diarrhea that persists.  You notice red streaks coming from the infected area.  Your red area gets larger or turns dark in color. This information is not intended to replace advice given to you by your health care provider. Make sure you discuss any questions you have with your health care provider. Document Released: 03/05/2005 Document Revised: 10/04/2015 Document Reviewed: 04/04/2015 Elsevier Interactive Patient Education  2017 [employer].   IF you received an x-ray today, you will receive an invoice from [facility]. Please contact [facility] at [phone] with questions or concerns regarding your invoice.   IF you received labwork today, you will receive an invoice from [facility]. Please contact [facility] at [phone] with questions or concerns regarding your invoice.   Our billing staff will not be able to assist you with questions regarding bills from these companies.  You will be contacted with the lab results as soon as they are available. The fastest way to get your results is to activate your My Chart account. Instructions are located on the last page of this paperwork. If you have not heard from [name] regarding the results in 2 weeks, please contact this office.       I personally performed the services  described in this documentation, which was scribed in my presence. The recorded information has been reviewed and considered for accuracy and completeness, addended by me as needed, and agree with information above.  Signed,   Merri Ray, MD Primary Care at [facility].  11/26/16 2:26 PM

## 2016-11-25 ENCOUNTER — Ambulatory Visit (INDEPENDENT_AMBULATORY_CARE_PROVIDER_SITE_OTHER): Payer: BLUE CROSS/BLUE SHIELD | Admitting: Urgent Care

## 2016-11-25 ENCOUNTER — Encounter: Payer: Self-pay | Admitting: Urgent Care

## 2016-11-25 VITALS — BP 109/68 | HR 80 | Temp 98.3°F | Resp 16 | Ht 68.9 in | Wt 265.2 lb

## 2016-11-25 DIAGNOSIS — L03114 Cellulitis of left upper limb: Secondary | ICD-10-CM | POA: Diagnosis not present

## 2016-11-25 DIAGNOSIS — M25422 Effusion, left elbow: Secondary | ICD-10-CM | POA: Diagnosis not present

## 2016-11-25 DIAGNOSIS — M25522 Pain in left elbow: Secondary | ICD-10-CM

## 2016-11-25 LAB — POCT CBC
Granulocyte percent: 7.1 %G — AB (ref 37–80)
HCT, POC: 39.9 % — AB (ref 43.5–53.7)
Hemoglobin: 14.4 g/dL (ref 14.1–18.1)
Lymph, poc: 2.5 (ref 0.6–3.4)
MCH, POC: 31.5 pg — AB (ref 27–31.2)
MCHC: 36.1 g/dL — AB (ref 31.8–35.4)
MCV: 87.3 fL (ref 80–97)
MID (cbc): 0.2 (ref 0–0.9)
MPV: 9.2 fL (ref 0–99.8)
POC Granulocyte: 7.1 — AB (ref 2–6.9)
POC LYMPH PERCENT: 25.8 %L (ref 10–50)
POC MID %: 2.2 %M (ref 0–12)
Platelet Count, POC: 190 10*3/uL (ref 142–424)
RBC: 4.57 M/uL — AB (ref 4.69–6.13)
RDW, POC: 12.5 %
WBC: 9.8 10*3/uL (ref 4.6–10.2)

## 2016-11-25 LAB — URIC ACID: Uric Acid: 6.7 mg/dL (ref 3.7–8.6)

## 2016-11-25 NOTE — Patient Instructions (Addendum)
Cellulitis, Adult Cellulitis is a skin infection. The infected area is usually red and tender. This condition occurs most often in the arms and lower legs. The infection can travel to the muscles, blood, and underlying tissue and become serious. It is very important to get treated for this condition. What are the causes? Cellulitis is caused by bacteria. The bacteria enter through a break in the skin, such as a cut, burn, insect bite, open sore, or crack. What increases the risk? This condition is more likely to occur in people who:  Have a weak defense system (immune system).  Have open wounds on the skin such as cuts, burns, bites, and scrapes. Bacteria can enter the body through these open wounds.  Are older.  Have diabetes.  Have a type of long-lasting (chronic) liver disease (cirrhosis) or kidney disease.  Use IV drugs.  What are the signs or symptoms? Symptoms of this condition include:  Redness, streaking, or spotting on the skin.  Swollen area of the skin.  Tenderness or pain when an area of the skin is touched.  Warm skin.  Fever.  Chills.  Blisters.  How is this diagnosed? This condition is diagnosed based on a medical history and physical exam. You may also have tests, including:  Blood tests.  Lab tests.  Imaging tests.  How is this treated? Treatment for this condition may include:  Medicines, such as antibiotic medicines or antihistamines.  Supportive care, such as rest and application of cold or warm cloths (cold or warm compresses) to the skin.  Hospital care, if the condition is severe.  The infection usually gets better within 1-2 days of treatment. Follow these instructions at home:  Take over-the-counter and prescription medicines only as told by your health care provider.  If you were prescribed an antibiotic medicine, take it as told by your health care provider. Do not stop taking the antibiotic even if you start to feel  better.  Drink enough fluid to keep your urine clear or pale yellow.  Do not touch or rub the infected area.  Raise (elevate) the infected area above the level of your heart while you are sitting or lying down.  Apply warm or cold compresses to the affected area as told by your health care provider.  Keep all follow-up visits as told by your health care provider. This is important. These visits let your health care provider make sure a more serious infection is not developing. Contact a health care provider if:  You have a fever.  Your symptoms do not improve within 1-2 days of starting treatment.  Your bone or joint underneath the infected area becomes painful after the skin has healed.  Your infection returns in the same area or another area.  You notice a swollen bump in the infected area.  You develop new symptoms.  You have a general ill feeling (malaise) with muscle aches and pains. Get help right away if:  Your symptoms get worse.  You feel very sleepy.  You develop vomiting or diarrhea that persists.  You notice red streaks coming from the infected area.  Your red area gets larger or turns dark in color. This information is not intended to replace advice given to you by your health care provider. Make sure you discuss any questions you have with your health care provider. Document Released: 03/05/2005 Document Revised: 10/04/2015 Document Reviewed: 04/04/2015 Elsevier Interactive Patient Education  2017 [employer]      IF you received an x-ray   today, you will receive an invoice from [facility]. Please contact [facility] at [phone] with questions or concerns regarding your invoice.   IF you received labwork today, you will receive an invoice from [facility]. Please contact [facility] at [phone] with questions or concerns regarding your invoice.   Our billing staff will not be able to assist you with questions regarding bills from  these companies.  You will be contacted with the lab results as soon as they are available. The fastest way to get your results is to activate your My Chart account. Instructions are located on the last page of this paperwork. If you have not heard from us regarding the results in 2 weeks, please contact this office.      

## 2016-11-25 NOTE — Progress Notes (Signed)
MRN: [id number] DOB: June 19, 1960  Subjective:   Steven Hodge is a 56 y.o. male presenting for follow up on cellulitis of his left elbow. Reports some improvement in his pain, swelling. He is taking doxycycline well. Denies fever, streaking, drainage, worsening nausea since starting Victoza, vomiting, abdominal pain.   Steven Hodge has a current medication list which includes the following prescription(s): aspirin, bayer contour next test, blood glucose meter kit and supplies, doxycycline, fish oil-omega-3 fatty acids, glucosamine-chondroitin, liraglutide, losartan-hydrochlorothiazide, metformin, multivitamin, turmeric, and vitamin d (ergocalciferol). Also has No Known Allergies. Steven Hodge  has a past medical history of Back pain; Cancer ([place]) (06/09/2006); Diabetes mellitus without complication ([place]); blood clots; Hypertension; Joint pain; and Swelling. Also  has a past surgical history that includes Knee surgery (2005) and basel cell cancer removed (2009).  Objective:   Vitals: BP 109/68   Pulse 80   Temp 98.3 F (36.8 C) (Oral)   Resp 16   Ht 5' 8.9" (1.75 m)   Wt 265 lb 3.2 oz (120.3 kg)   SpO2 99%   BMI 39.28 kg/m   Physical Exam  Constitutional: He is oriented to person, place, and time. He appears well-developed and well-nourished.  Cardiovascular: Normal rate.   Pulmonary/Chest: Effort normal.  Musculoskeletal:       Left elbow: He exhibits decreased range of motion (full flexion) and swelling (over area depicted with associated warmth). He exhibits no effusion, no deformity and no laceration. Tenderness found. Olecranon process (and lateral forearm) tenderness noted.  Neurological: He is alert and oriented to person, place, and time.       Results for orders placed or performed in visit on 11/25/16 (from the past 24 hour(s))  POCT CBC     Status: Abnormal   Collection Time: 11/25/16 11:19 AM  Result Value Ref Range   WBC 9.8 4.6 - 10.2 K/uL   Lymph, poc 2.5 0.6 - 3.4   POC  LYMPH PERCENT 25.8 10 - 50 %L   MID (cbc) 0.2 0 - 0.9   POC MID % 2.2 0 - 12 %M   POC Granulocyte 7.1 (A) 2 - 6.9   Granulocyte percent 7.1 (A) 37 - 80 %G   RBC 4.57 (A) 4.69 - 6.13 M/uL   Hemoglobin 14.4 14.1 - 18.1 g/dL   HCT, POC 39.9 (A) 43.5 - 53.7 %   MCV 87.3 80 - 97 fL   MCH, POC 31.5 (A) 27 - 31.2 pg   MCHC 36.1 (A) 31.8 - 35.4 g/dL   RDW, POC 12.5 %   Platelet Count, POC 190 142 - 424 K/uL   MPV 9.2 0 - 99.8 fL   Assessment and Plan :   1. Cellulitis of left elbow 2. Left elbow pain 3. Swelling of left elbow - Mild improvement after 1 day. CBC is trending downward. Patient is worried about continuing to come in for close follow up due to time constraints. I counseled patient extensively on monitoring for worsening symptoms. He plans on coming back for a recheck with Dr. Tamala Julian on 12/02/2016 or sooner if worsening symptoms as discussed in clinic.   Jaynee Eagles, PA-C [facility] [phone] 11/25/2016 10:59 AM

## 2016-11-27 ENCOUNTER — Ambulatory Visit: Payer: BLUE CROSS/BLUE SHIELD | Admitting: Emergency Medicine

## 2016-12-02 ENCOUNTER — Encounter: Payer: Self-pay | Admitting: Family Medicine

## 2016-12-02 ENCOUNTER — Ambulatory Visit (INDEPENDENT_AMBULATORY_CARE_PROVIDER_SITE_OTHER): Payer: BLUE CROSS/BLUE SHIELD | Admitting: Family Medicine

## 2016-12-02 VITALS — BP 131/73 | HR 79 | Temp 98.0°F | Resp 18 | Ht 68.11 in | Wt 264.0 lb

## 2016-12-02 DIAGNOSIS — L03114 Cellulitis of left upper limb: Secondary | ICD-10-CM

## 2016-12-02 DIAGNOSIS — E119 Type 2 diabetes mellitus without complications: Secondary | ICD-10-CM

## 2016-12-02 DIAGNOSIS — E559 Vitamin D deficiency, unspecified: Secondary | ICD-10-CM

## 2016-12-02 DIAGNOSIS — Z23 Encounter for immunization: Secondary | ICD-10-CM | POA: Diagnosis not present

## 2016-12-02 DIAGNOSIS — I10 Essential (primary) hypertension: Secondary | ICD-10-CM | POA: Diagnosis not present

## 2016-12-02 NOTE — Patient Instructions (Signed)
     IF you received an x-ray today, you will receive an invoice from [facility]. Please contact [facility] at [phone] with questions or concerns regarding your invoice.   IF you received labwork today, you will receive an invoice from [facility]. Please contact [facility] at [phone] with questions or concerns regarding your invoice.   Our billing staff will not be able to assist you with questions regarding bills from these companies.  You will be contacted with the lab results as soon as they are available. The fastest way to get your results is to activate your My Chart account. Instructions are located on the last page of this paperwork. If you have not heard from us regarding the results in 2 weeks, please contact this office.     

## 2016-12-02 NOTE — Progress Notes (Signed)
Subjective:    Patient ID: Steven Hodge, male    DOB: [birth year], 56 y.o.   MRN: [id number]  12/02/2016  Diabetes (3 month follow-up) and Hypertension   HPI This 56 y.o. male presents for evaluation of DMII, obesity.  Dr. Leafy Ro started Victoza; hates the stuff;  Has early satiety but lasts all day; feels full all day.  Side effect is slowly improving.  Sugars average 131 which is much improved. Upper 120s. Eating a lot more th an ever has eaten in past.  Eating way more. 3 eggs, cheese, 2 pieces of toast. Sandwich, 4 ounces, vegeetable, apple, milk. 2 cups of vegetables, 10 ounces of meat. Must fit in 2 snacks.   Now eating way more than usual.   In twenties, started Slim Fast.  Always really compliant. Wife also seeing Leafy Ro; wife feels guilty eating so much.   Stopped Glipizide since last visit.  Changed Metformin to 537m tid only; stopped XR Metformin.   No sugar above 156 in quite some time.   Feels no different; has never felt badly. Recommend NOT exercising until liver is stabilized with current eating and diabetic management.  Suffered a LEFT elbow infection in past two weeks.  Small laceration on LEFT elbow.    Hypertension: Patient reports good compliance with medication, good tolerance to medication, and good symptom control.  106/67.  Some dizziness when standing up and sitting down.    BP Readings from Last 3 Encounters:  12/02/16 131/73  11/25/16 109/68  11/24/16 127/77   Wt Readings from Last 3 Encounters:  12/02/16 264 lb (119.7 kg)  11/25/16 265 lb 3.2 oz (120.3 kg)  11/24/16 266 lb (120.7 kg)   Immunization History  Administered Date(s) Administered  . Hepatitis A, Adult 12/02/2016  . Hepatitis B, adult 04/22/2016, 08/20/2016, 12/02/2016  . Influenza,inj,Quad PF,36+ Mos 03/09/2015, 04/22/2016  . Pneumococcal Polysaccharide-23 03/09/2015  . Tdap 06/09/2008    Review of Systems  Constitutional: Negative for activity change, appetite change, chills,  diaphoresis, fatigue and fever.  Respiratory: Negative for cough and shortness of breath.   Cardiovascular: Negative for chest pain, palpitations and leg swelling.  Gastrointestinal: Negative for abdominal pain, diarrhea, nausea and vomiting.  Endocrine: Negative for cold intolerance, heat intolerance, polydipsia, polyphagia and polyuria.  Musculoskeletal: Positive for joint swelling.  Skin: Negative for color change, rash and wound.  Neurological: Negative for dizziness, tremors, seizures, syncope, facial asymmetry, speech difficulty, weakness, light-headedness, numbness and headaches.  Psychiatric/Behavioral: Negative for dysphoric mood and sleep disturbance. The patient is not nervous/anxious.     Past Medical History:  Diagnosis Date  . [place] pain   . [place]   Basal cell carcinoma scalp;   .Marland KitchenDiabetes mellitus without complication ([place]   . Hx of blood clots   . Hypertension    Past Surgical History:  Procedure Laterality Date  . basel cell cancer removed  2009  . KNEE SURGERY  2005   ACL repair R   No Known Allergies Current Outpatient Prescriptions  Medication Sig Dispense Refill  . aspirin 325 MG tablet Take 325 mg by mouth daily.    .Marland KitchenBAYER CONTOUR NEXT TEST test strip USE TO CHECK BLOOD SUGAR UP TO 4 TIMES ADAY 100 each 1  . blood glucose meter kit and supplies Dispense based on patient and insurance preference. Use up to four times daily as directed. (FOR ICD-9 250.00, 250.01). 1 each 0  . doxycycline (VIBRA-TABS) 100 MG tablet Take 1 tablet (100 mg  total) by mouth 2 (two) times daily. 20 tablet 0  . fish oil-omega-3 fatty acids 1000 MG capsule Take 2 g by mouth daily.    [name] glucosamine-chondroitin 500-400 MG tablet Take 1 tablet by mouth once.    . liraglutide 18 MG/3ML SOPN Inject 0.1 mLs (0.6 mg total) into the skin every morning. (Patient taking differently: Inject 0.3 mg into the skin 3 (three) times a week. ) 1 pen 0  . losartan-hydrochlorothiazide  (HYZAAR) 50-12.5 MG tablet Take 1 tablet by mouth daily. 90 tablet 1  . metFORMIN (GLUCOPHAGE) 500 MG tablet Take 1 tablet (500 mg total) by mouth 3 (three) times daily. 90 tablet 0  . Multiple Vitamin (MULTIVITAMIN) tablet Take 1 tablet by mouth daily.    . TURMERIC PO Take 1,000 mg by mouth 2 (two) times daily.    . Vitamin D, Ergocalciferol, (DRISDOL) 50000 units CAPS capsule Take 1 capsule (50,000 Units total) by mouth every 7 (seven) days. 4 capsule 0   No current facility-administered medications for this visit.    Social History   Social History  . Marital status: Married    Spouse name: N/A  . Number of children: N/A  . Years of education: N/A   Occupational History  . [employer] executive, Dealer    Social History Main Topics  . Smoking status: Former Research scientist (life sciences)  . Smokeless tobacco: Never Used  . Alcohol use No  . Drug use: No  . Sexual activity: Yes   Other Topics Concern  . 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 Diabetes Mother   . Liver disease Mother   . Obesity Mother   . Cancer Father 68       lung cancer  . Diabetes Father   . Cancer Sister 42       lung cancer  . Heart disease Brother 40       cardiac stenting/CAD  . Cancer Brother        skin cancer       Objective:    BP 131/73   Pulse 79   Temp 98 F (36.7 C) (Oral)   Resp 18   Ht 5' 8.11" (1.73 m)   Wt 264 lb (119.7 kg)   SpO2 98%   BMI 40.01 kg/m  Physical Exam  Constitutional: He is oriented to person, place, and time. He appears well-developed and well-nourished. No distress.  HENT:  Head: Normocephalic and atraumatic.  Right Ear: External ear normal.  Left  Ear: External ear normal.  Nose: Nose normal.  Mouth/Throat: Oropharynx is clear and moist.  Eyes: Conjunctivae and EOM are normal. Pupils are equal, round, and reactive to light.  Neck: Normal range of motion. Neck supple. Carotid bruit is not present. No thyromegaly present.  Cardiovascular: Normal rate, regular rhythm, normal heart sounds and intact distal pulses.  Exam reveals no gallop and no friction rub.   No murmur heard. Pulmonary/Chest: Effort normal  and breath sounds normal. He has no wheezes. He has no rales.  Abdominal: Soft. Bowel sounds are normal. He exhibits no distension and no mass. There is no tenderness. There is no rebound and no guarding.  Musculoskeletal:       Left elbow: He exhibits decreased range of motion and swelling. He exhibits no effusion, no deformity and no laceration. No tenderness found. No radial head, no medial epicondyle, no lateral epicondyle and no olecranon process tenderness noted.  Mild edema olecranon process without tenderness.  Limited extension and flexion of elbow.  Lymphadenopathy:    He has no cervical adenopathy.  Neurological: He is alert and oriented to person, place, and time. No cranial nerve deficit.  Skin: Skin is warm and dry. No rash noted. He is not diaphoretic. No erythema.  Psychiatric: He has a normal mood and affect. His behavior is normal.  Nursing note and vitals reviewed.  Results for orders placed or performed in visit on 11/25/16  POCT CBC  Result Value Ref Range   WBC 9.8 4.6 - 10.2 K/uL   Lymph, poc 2.5 0.6 - 3.4   POC LYMPH PERCENT 25.8 10 - 50 %L   MID (cbc) 0.2 0 - 0.9   POC MID % 2.2 0 - 12 %M   POC Granulocyte 7.1 (A) 2 - 6.9   Granulocyte percent 7.1 (A) 37 - 80 %G   RBC 4.57 (A) 4.69 - 6.13 M/uL   Hemoglobin 14.4 14.1 - 18.1 g/dL   HCT, POC 39.9 (A) 43.5 - 53.7 %   MCV 87.3 80 - 97 fL   MCH, POC 31.5 (A) 27 - 31.2 pg   MCHC 36.1 (A) 31.8 - 35.4 g/dL   RDW, POC 12.5 %   Platelet Count, POC 190 142 - 424  K/uL   MPV 9.2 0 - 99.8 fL       Assessment & Plan:   1. Type 2 diabetes mellitus without complication, without long-term current use of insulin ([place])   2. Essential hypertension   3. Vitamin D deficiency   4. Morbid obesity ([place])   5. Cellulitis of left elbow   6. Need for prophylactic vaccination and inoculation against viral hepatitis    -controlled DMII and hypertension; pt followed by Dr. Leafy Ro of weight management at this time; thus, will defer labs at this time.   -monitor blood pressure closely at each visit with weight loss; will need to decrease medication as systolic less than 149 majority of time.  Pt expressed understanding and states that Dr. Leafy Ro following closely. -s/p Hepatitis B#3 and Hepatitis A#1.  RTC six months for Hepatitis A#2.  -L elbow cellulitis nearly resolved; advised to complete abx therapy.  RTC for acute worsening.   Orders Placed This Encounter  Procedures  . Hepatitis B vaccine adult IM  . Hepatitis A vaccine adult IM   No orders of the defined types were placed in this encounter.   Return in about 6 months (around 06/03/2017) for complete physical examiniation.   Giovonnie Trettel Elayne Guerin, M.D. Primary Care at [facility] [address] [place], [place]  [address] [phone] phone [phone] fax

## 2016-12-06 ENCOUNTER — Encounter: Payer: Self-pay | Admitting: Family Medicine

## 2016-12-08 ENCOUNTER — Ambulatory Visit (INDEPENDENT_AMBULATORY_CARE_PROVIDER_SITE_OTHER): Payer: BLUE CROSS/BLUE SHIELD | Admitting: Family Medicine

## 2016-12-08 VITALS — BP 113/69 | HR 75 | Temp 98.0°F | Ht 71.0 in | Wt 259.0 lb

## 2016-12-08 DIAGNOSIS — E669 Obesity, unspecified: Secondary | ICD-10-CM

## 2016-12-08 DIAGNOSIS — Z6839 Body mass index (BMI) 39.0-39.9, adult: Secondary | ICD-10-CM

## 2016-12-08 DIAGNOSIS — IMO0001 Reserved for inherently not codable concepts without codable children: Secondary | ICD-10-CM

## 2016-12-08 DIAGNOSIS — E119 Type 2 diabetes mellitus without complications: Secondary | ICD-10-CM | POA: Diagnosis not present

## 2016-12-08 MED ORDER — LIRAGLUTIDE 18 MG/3ML ~~LOC~~ SOPN
0.6000 mg | PEN_INJECTOR | Freq: Every morning | SUBCUTANEOUS | 0 refills | Status: DC
Start: 2016-12-08 — End: 2017-02-12

## 2016-12-08 MED ORDER — METFORMIN HCL 500 MG PO TABS: 500.0000 mg | ORAL_TABLET | Freq: Three times a day (TID) | ORAL | 0 refills | Status: DC

## 2016-12-09 NOTE — Progress Notes (Signed)
Office: [phone]  /  Fax: [phone]   HPI:   Chief Complaint: OBESITY Steven Hodge is here to discuss his progress with his obesity treatment plan. He is on the  follow the Category 3 plan and is following his eating plan approximately 98 % of the time. He states he is exercising 0 minutes 0 times per week. Steven Hodge continues to do well with weight loss on category 3 plan, hunger is controlled. [place] for a living and is very physically active daily. His weight is 259 lb (117.5 kg) today and has had a weight loss of 2 pounds over a period of 3 weeks since his last visit. He has lost 13 lbs since starting treatment with [name].  Diabetes II Steven Hodge has a diagnosis of diabetes type II. Steven Hodge denies any hypoglycemic episodes. He has been working on intensive lifestyle modifications including diet, exercise, and weight loss to help control his blood glucose levels.   ALLERGIES: No Known Allergies  MEDICATIONS: Current Outpatient Prescriptions on File Prior to Visit  Medication Sig Dispense Refill  . aspirin 325 MG tablet Take 325 mg by mouth daily.    [name] BAYER CONTOUR NEXT TEST test strip USE TO CHECK BLOOD SUGAR UP TO 4 TIMES ADAY 100 each 1  . blood glucose meter kit and supplies Dispense based on patient and insurance preference. Use up to four times daily as directed. (FOR ICD-9 250.00, 250.01). 1 each 0  . fish oil-omega-3 fatty acids 1000 MG capsule Take 2 g by mouth daily.    [name] glucosamine-chondroitin 500-400 MG tablet Take 1 tablet by mouth once.    [name] losartan-hydrochlorothiazide (HYZAAR) 50-12.5 MG tablet Take 1 tablet by mouth daily. 90 tablet 1  . Multiple Vitamin (MULTIVITAMIN) tablet Take 1 tablet by mouth daily.    . TURMERIC PO Take 1,000 mg by mouth 2 (two) times daily.    . Vitamin D, Ergocalciferol, (DRISDOL) 50000 units CAPS capsule Take 1 capsule (50,000 Units total) by mouth every 7 (seven) days. 4 capsule 0  . doxycycline (VIBRA-TABS) 100 MG tablet Take 1 tablet (100 mg  total) by mouth 2 (two) times daily. (Patient not taking: Reported on 12/08/2016) 20 tablet 0   No current facility-administered medications on file prior to visit.     PAST MEDICAL HISTORY: Past Medical History:  Diagnosis Date  . Back pain   . Cancer ([place]) 06/09/2006   Basal cell carcinoma scalp;   [name] Diabetes mellitus without complication ([place])   . Hx of blood clots   . Hypertension     PAST SURGICAL HISTORY: Past Surgical History:  Procedure Laterality Date  . basel cell cancer removed  2009  . KNEE SURGERY  2005   ACL repair R    SOCIAL HISTORY: Social History  Substance Use Topics  . Smoking status: Former Research scientist (life sciences)  . Smokeless tobacco: Never Used  . Alcohol use No    FAMILY HISTORY: Family History  Problem Relation Age of Onset  . Diabetes Mother   . Liver disease Mother   . Obesity Mother   . Cancer Father 61       lung cancer  . Diabetes Father   . Cancer Sister 74       lung cancer  . Heart disease Brother 78       cardiac stenting/CAD  . Cancer Brother        skin cancer    ROS: Review of Systems  Constitutional: Positive for weight loss.  Endo/Heme/Allergies:  Negative hypoglycemia    PHYSICAL EXAM: Blood pressure 113/69, pulse 75, temperature 98 F (36.7 C), temperature source Oral, height '5\' 11"'  (1.803 m), weight 259 lb (117.5 kg), SpO2 96 %. Body mass index is 36.12 kg/m. Physical Exam  Constitutional: He is oriented to person, place, and time. He appears well-developed and well-nourished.  Cardiovascular: Normal rate.   Pulmonary/Chest: Effort normal.  Musculoskeletal: Normal range of motion.  Neurological: He is oriented to person, place, and time.  Skin: Skin is warm and dry.  Psychiatric: He has a normal mood and affect. His behavior is normal.  Vitals reviewed.   RECENT LABS AND TESTS: BMET    Component Value Date/Time   NA 143 09/18/2016 0956   K 4.1 10/06/2016 1150   CL 100 09/18/2016 0956   CO2 28 09/18/2016 0956    GLUCOSE 139 (H) 09/18/2016 0956   GLUCOSE 146 (H) 04/22/2016 1349   BUN 16 09/18/2016 0956   CREATININE 0.79 09/18/2016 0956   CREATININE 0.88 04/22/2016 1349   CALCIUM 9.5 09/18/2016 0956   GFRNONAA 103 09/18/2016 0956   GFRAA 119 09/18/2016 0956   Lab Results  Component Value Date   HGBA1C 6.7 (H) 09/18/2016   HGBA1C 6.7 (H) 08/20/2016   HGBA1C 6.1 04/22/2016   HGBA1C 6.3 10/02/2015   HGBA1C 5.9 06/15/2015   Lab Results  Component Value Date   INSULIN 32.4 (H) 09/18/2016   CBC    Component Value Date/Time   WBC 9.8 11/25/2016 1119   WBC 8.2 04/22/2016 1349   RBC 4.57 (A) 11/25/2016 1119   RBC 5.24 04/22/2016 1349   HGB 14.4 11/25/2016 1119   HGB 14.8 09/18/2016 0956   HCT 39.9 (A) 11/25/2016 1119   HCT 43.1 09/18/2016 0956   PLT 197 09/18/2016 0956   MCV 87.3 11/25/2016 1119   MCV 87 09/18/2016 0956   MCH 31.5 (A) 11/25/2016 1119   MCH 30.9 04/22/2016 1349   MCHC 36.1 (A) 11/25/2016 1119   MCHC 34.4 04/22/2016 1349   RDW 13.5 09/18/2016 0956   LYMPHSABS 2.1 09/18/2016 0956   MONOABS 656 04/22/2016 1349   EOSABS 0.2 09/18/2016 0956   BASOSABS 0.0 09/18/2016 0956   Iron/TIBC/Ferritin/ %Sat No results found for: IRON, TIBC, FERRITIN, IRONPCTSAT Lipid Panel     Component Value Date/Time   CHOL 195 09/18/2016 0956   TRIG 161 (H) 09/18/2016 0956   HDL 44 09/18/2016 0956   CHOLHDL 4.4 04/22/2016 1349   VLDL 30 04/22/2016 1349   LDLCALC 119 (H) 09/18/2016 0956   Hepatic Function Panel     Component Value Date/Time   PROT 6.9 09/18/2016 0956   ALBUMIN 4.4 09/18/2016 0956   AST 34 09/18/2016 0956   ALT 75 (H) 09/18/2016 0956   ALKPHOS 56 09/18/2016 0956   BILITOT 0.3 09/18/2016 0956      Component Value Date/Time   TSH 2.230 09/18/2016 0956   TSH 2.44 04/22/2016 1349   TSH 1.731 03/09/2015 1524    ASSESSMENT AND PLAN: Type 2 diabetes mellitus without complication, without long-term current use of insulin ([place]) - Plan: metFORMIN (GLUCOPHAGE) 500 MG  tablet, liraglutide 18 MG/3ML SOPN  Class 2 obesity with serious comorbidity and body mass index (BMI) of 39.0 to 39.9 in adult, unspecified obesity type  PLAN:  Diabetes II Steven Hodge has been given extensive diabetes education by myself today including ideal fasting and post-prandial blood glucose readings, individual ideal Hgb A1c goals  and hypoglycemia prevention. We discussed the importance of good blood sugar  control to decrease the likelihood of diabetic complications such as nephropathy, neuropathy, limb loss, blindness, coronary artery disease, and death. We discussed the importance of intensive lifestyle modification including diet, exercise and weight loss as the first line treatment for diabetes. Steven Hodge agrees to continue metformin, we will refill for 1 month and he agrees to continue victoza, we will refill for 1 month and he will follow up at the agreed upon time.  Obesity Steven Hodge is currently in the action stage of change. As such, his goal is to continue with weight loss efforts He has agreed to follow the Category 3 plan Steven Hodge has been instructed to work up to a goal of 150 minutes of combined cardio and strengthening exercise per week for weight loss and overall health benefits. We discussed the following Behavioral Modification Strategies today: increasing lean protein intake and decrease eating out  Steven Hodge has agreed to follow up with our clinic in 2 to 3 weeks. He was informed of the importance of frequent follow up visits to maximize his success with intensive lifestyle modifications for his multiple health conditions.  I, Steven Hodge, am acting as transcriptionist for Dennard Nip, MD  I have reviewed the above documentation for accuracy and completeness, and I agree with the above. -Dennard Nip, MD  OBESITY BEHAVIORAL INTERVENTION VISIT  Today's visit was # 6 out of 22.  Starting weight: 272 lbs Starting date: 09/18/16 Today's weight : 259 lbs  Today's date:  12/08/2016 Total lbs lost to date: 13 (Patients must lose 7 lbs in the first 6 months to continue with counseling)   ASK: We discussed the diagnosis of obesity with Steven Hodge today and Steven Hodge agreed to give [name] permission to discuss obesity behavioral modification therapy today.  ASSESS: [place] has the diagnosis of obesity and his BMI today is 36.2 Xavi is in the action stage of change   ADVISE: Jonovan was educated on the multiple health risks of obesity as well as the benefit of weight loss to improve his health. He was advised of the need for long term treatment and the importance of lifestyle modifications.  AGREE: Multiple dietary modification options and treatment options were discussed and  [place] agreed to follow the Category 3 plan We discussed the following Behavioral Modification Strategies today: increasing lean protein intake and decrease eating out

## 2016-12-29 ENCOUNTER — Ambulatory Visit (INDEPENDENT_AMBULATORY_CARE_PROVIDER_SITE_OTHER): Payer: BLUE CROSS/BLUE SHIELD | Admitting: Family Medicine

## 2016-12-29 VITALS — BP 120/72 | HR 77 | Temp 98.2°F | Ht 68.0 in | Wt 259.0 lb

## 2016-12-29 DIAGNOSIS — Z6839 Body mass index (BMI) 39.0-39.9, adult: Secondary | ICD-10-CM | POA: Diagnosis not present

## 2016-12-29 DIAGNOSIS — E119 Type 2 diabetes mellitus without complications: Secondary | ICD-10-CM | POA: Diagnosis not present

## 2016-12-29 DIAGNOSIS — E559 Vitamin D deficiency, unspecified: Secondary | ICD-10-CM | POA: Diagnosis not present

## 2016-12-29 DIAGNOSIS — IMO0001 Reserved for inherently not codable concepts without codable children: Secondary | ICD-10-CM

## 2016-12-29 DIAGNOSIS — E669 Obesity, unspecified: Secondary | ICD-10-CM

## 2016-12-29 MED ORDER — METFORMIN HCL 500 MG PO TABS: 500.0000 mg | ORAL_TABLET | Freq: Three times a day (TID) | ORAL | 0 refills | Status: DC

## 2016-12-29 MED ORDER — VITAMIN D (ERGOCALCIFEROL) 1.25 MG (50000 UNIT) PO CAPS: 50000.0000 [IU] | ORAL_CAPSULE | ORAL | 0 refills | Status: DC

## 2016-12-30 NOTE — Progress Notes (Signed)
Office: [phone]  /  Fax: [phone]   HPI:   Chief Complaint: OBESITY Steven Hodge is here to discuss his progress with his obesity treatment plan. He is on the  follow the Category 3 plan and is following his eating plan approximately 98 % of the time. He states he is exercising 0 minutes 0 times per week. Steven Hodge maintained weight in the last 2 weeks. He has been a bit more off track with increased social/celebration eating but is now ready to get back on track. His weight is 259 lb (117.5 kg) today and has maintained weight over a period of 3 weeks since his last visit. He has lost 13 lbs since starting treatment with [name].  Vitamin D deficiency Steven Hodge has a diagnosis of vitamin D deficiency. He is currently stable on vit D, not yet at goal. He still notes fatigue and denies nausea, vomiting or muscle weakness.  Diabetes II Steven Hodge has a diagnosis of diabetes type II. Steven Hodge notes elevated blood sugar with recent infection and on antibiotic but is already starting to improve again with diet. Steven Hodge denies any hypoglycemic episodes. He has been working on intensive lifestyle modifications including diet, exercise, and weight loss to help control his blood glucose levels.  ALLERGIES: No Known Allergies  MEDICATIONS: Current Outpatient Prescriptions on File Prior to Visit  Medication Sig Dispense Refill   aspirin 325 MG tablet Take 325 mg by mouth daily.     BAYER CONTOUR NEXT TEST test strip USE TO CHECK BLOOD SUGAR UP TO 4 TIMES ADAY 100 each 1   blood glucose meter kit and supplies Dispense based on patient and insurance preference. Use up to four times daily as directed. (FOR ICD-9 250.00, 250.01). 1 each 0   fish oil-omega-3 fatty acids 1000 MG capsule Take 2 g by mouth daily.     glucosamine-chondroitin 500-400 MG tablet Take 1 tablet by mouth once.     liraglutide 18 MG/3ML SOPN Inject 0.1 mLs (0.6 mg total) into the skin every morning. 1 pen 0   losartan-hydrochlorothiazide  (HYZAAR) 50-12.5 MG tablet Take 1 tablet by mouth daily. 90 tablet 1   Multiple Vitamin (MULTIVITAMIN) tablet Take 1 tablet by mouth daily.     TURMERIC PO Take 1,000 mg by mouth 2 (two) times daily.     No current facility-administered medications on file prior to visit.     PAST MEDICAL HISTORY: Past Medical History:  Diagnosis Date   Back pain    Cancer ([place]) 06/09/2006   Basal cell carcinoma scalp;    Diabetes mellitus without complication (HCC)    Hx of blood clots    Hypertension     PAST SURGICAL HISTORY: Past Surgical History:  Procedure Laterality Date   basel cell cancer removed  2009   KNEE SURGERY  2005   ACL repair R    SOCIAL HISTORY: Social History  Substance Use Topics   Smoking status: Former Smoker   Smokeless tobacco: Never Used   Alcohol use No    FAMILY HISTORY: Family History  Problem Relation Age of Onset   Diabetes Mother    Liver disease Mother    Obesity Mother    Cancer Father 32       lung cancer   Diabetes Father    Cancer Sister 17       lung cancer   Heart disease Brother 91       cardiac stenting/CAD   Cancer Brother  skin cancer    ROS: Review of Systems  Constitutional: Positive for malaise/fatigue. Negative for weight loss.  Gastrointestinal: Negative for vomiting.  Musculoskeletal:       Negative muscle weakness  Endo/Heme/Allergies:       Negative hypoglycemia    PHYSICAL EXAM: Blood pressure 120/72, pulse 77, temperature 98.2 F (36.8 C), temperature source Oral, height '5\' 8"'  (1.727 m), weight 259 lb (117.5 kg), SpO2 98 %. Body mass index is 39.38 kg/m. Physical Exam  Constitutional: He is oriented to person, place, and time. He appears well-developed and well-nourished.  Cardiovascular: Normal rate.   Pulmonary/Chest: Effort normal.  Musculoskeletal: Normal range of motion.  Neurological: He is oriented to person, place, and time.  Skin: Skin is warm and dry.  Psychiatric: He has a  normal mood and affect. His behavior is normal.  Vitals reviewed.   RECENT LABS AND TESTS: BMET    Component Value Date/Time   NA 143 09/18/2016 0956   K 4.1 10/06/2016 1150   CL 100 09/18/2016 0956   CO2 28 09/18/2016 0956   GLUCOSE 139 (H) 09/18/2016 0956   GLUCOSE 146 (H) 04/22/2016 1349   BUN 16 09/18/2016 0956   CREATININE 0.79 09/18/2016 0956   CREATININE 0.88 04/22/2016 1349   CALCIUM 9.5 09/18/2016 0956   GFRNONAA 103 09/18/2016 0956   GFRAA 119 09/18/2016 0956   Lab Results  Component Value Date   HGBA1C 6.7 (H) 09/18/2016   HGBA1C 6.7 (H) 08/20/2016   HGBA1C 6.1 04/22/2016   HGBA1C 6.3 10/02/2015   HGBA1C 5.9 06/15/2015   Lab Results  Component Value Date   INSULIN 32.4 (H) 09/18/2016   CBC    Component Value Date/Time   WBC 9.8 11/25/2016 1119   WBC 8.2 04/22/2016 1349   RBC 4.57 (A) 11/25/2016 1119   RBC 5.24 04/22/2016 1349   HGB 14.4 11/25/2016 1119   HGB 14.8 09/18/2016 0956   HCT 39.9 (A) 11/25/2016 1119   HCT 43.1 09/18/2016 0956   PLT 197 09/18/2016 0956   MCV 87.3 11/25/2016 1119   MCV 87 09/18/2016 0956   MCH 31.5 (A) 11/25/2016 1119   MCH 30.9 04/22/2016 1349   MCHC 36.1 (A) 11/25/2016 1119   MCHC 34.4 04/22/2016 1349   RDW 13.5 09/18/2016 0956   LYMPHSABS 2.1 09/18/2016 0956   MONOABS 656 04/22/2016 1349   EOSABS 0.2 09/18/2016 0956   BASOSABS 0.0 09/18/2016 0956   Iron/TIBC/Ferritin/ %Sat No results found for: IRON, TIBC, FERRITIN, IRONPCTSAT Lipid Panel     Component Value Date/Time   CHOL 195 09/18/2016 0956   TRIG 161 (H) 09/18/2016 0956   HDL 44 09/18/2016 0956   CHOLHDL 4.4 04/22/2016 1349   VLDL 30 04/22/2016 1349   LDLCALC 119 (H) 09/18/2016 0956   Hepatic Function Panel     Component Value Date/Time   PROT 6.9 09/18/2016 0956   ALBUMIN 4.4 09/18/2016 0956   AST 34 09/18/2016 0956   ALT 75 (H) 09/18/2016 0956   ALKPHOS 56 09/18/2016 0956   BILITOT 0.3 09/18/2016 0956      Component Value Date/Time   TSH  2.230 09/18/2016 0956   TSH 2.44 04/22/2016 1349   TSH 1.731 03/09/2015 1524    ASSESSMENT AND PLAN: Class 2 obesity with serious comorbidity and body mass index (BMI) of 39.0 to 39.9 in adult, unspecified obesity type  Vitamin D deficiency - Plan: Vitamin D, Ergocalciferol, (DRISDOL) 50000 units CAPS capsule  Type 2 diabetes mellitus without complication, without long-term  current use of insulin ([place]) - Plan: metFORMIN (GLUCOPHAGE) 500 MG tablet  PLAN:  Vitamin D Deficiency Steven Hodge was informed that low vitamin D levels contributes to fatigue and are associated with obesity, breast, and colon cancer. He agrees to continue to take prescription Vit D '@50' ,000 IU every week, we will refill for 1 month and will follow up for routine testing of vitamin D, at least 2-3 times per year. He was informed of the risk of over-replacement of vitamin D and agrees to not increase his dose unless he discusses this with [name] first. Steven Hodge agrees to follow up with our clinic in 2 to 3 weeks.  Diabetes II Steven Hodge has been given extensive diabetes education by myself today including ideal fasting and post-prandial blood glucose readings, individual ideal Hgb A1c goals  and hypoglycemia prevention. We discussed the importance of good blood sugar control to decrease the likelihood of diabetic complications such as nephropathy, neuropathy, limb loss, blindness, coronary artery disease, and death. We discussed the importance of intensive lifestyle modification including diet, exercise and weight loss as the first line treatment for diabetes. Steven Hodge agrees to continue metformin, we will refill for 1 month and he will continue his diet prescription and will follow up at the agreed upon time.  Obesity Steven Hodge is currently in the action stage of change. As such, his goal is to continue with weight loss efforts He has agreed to follow the Category 3 plan Steven Hodge has been instructed to work up to a goal of 150 minutes of combined  cardio and strengthening exercise per week for weight loss and overall health benefits. We discussed the following Behavioral Modification Strategies today: increasing lean protein intake and decreasing simple carbohydrates   Steven Hodge has agreed to follow up with our clinic in 2 to 3 weeks. He was informed of the importance of frequent follow up visits to maximize his success with intensive lifestyle modifications for his multiple health conditions.  I, Steven Hodge, am acting as transcriptionist for Dennard Nip, MD  I have reviewed the above documentation for accuracy and completeness, and I agree with the above. -Dennard Nip, MD  OBESITY BEHAVIORAL INTERVENTION VISIT  Today's visit was # 7 out of 22.  Starting weight: 272 lbs Starting date: 09/18/16 Today's weight : 259 lbs Today's date: 12/29/2016 Total lbs lost to date: 13 (Patients must lose 7 lbs in the first 6 months to continue with counseling)   ASK: We discussed the diagnosis of obesity with [name] today and [name] agreed to give [name] permission to discuss obesity behavioral modification therapy today.  ASSESS: Lamoyne has the diagnosis of obesity and his BMI today is 39.5 Jru is in the action stage of change   ADVISE: Treshun was educated on the multiple health risks of obesity as well as the benefit of weight loss to improve his health. He was advised of the need for long term treatment and the importance of lifestyle modifications.  AGREE: Multiple dietary modification options and treatment options were discussed and  Keonte agreed to follow the Category 3 plan We discussed the following Behavioral Modification Strategies today: increasing lean protein intake and decreasing simple carbohydrates

## 2017-01-08 ENCOUNTER — Other Ambulatory Visit (INDEPENDENT_AMBULATORY_CARE_PROVIDER_SITE_OTHER): Payer: Self-pay | Admitting: Family Medicine

## 2017-01-08 ENCOUNTER — Encounter (INDEPENDENT_AMBULATORY_CARE_PROVIDER_SITE_OTHER): Payer: Self-pay

## 2017-01-08 ENCOUNTER — Other Ambulatory Visit (INDEPENDENT_AMBULATORY_CARE_PROVIDER_SITE_OTHER): Payer: Self-pay

## 2017-01-08 DIAGNOSIS — E559 Vitamin D deficiency, unspecified: Secondary | ICD-10-CM

## 2017-01-08 MED ORDER — VITAMIN D (ERGOCALCIFEROL) 1.25 MG (50000 UNIT) PO CAPS: 50000.0000 [IU] | ORAL_CAPSULE | ORAL | 0 refills | Status: DC

## 2017-01-19 ENCOUNTER — Ambulatory Visit (INDEPENDENT_AMBULATORY_CARE_PROVIDER_SITE_OTHER): Payer: BLUE CROSS/BLUE SHIELD | Admitting: Family Medicine

## 2017-01-19 VITALS — BP 109/72 | HR 76 | Temp 98.1°F | Ht 68.0 in | Wt 261.0 lb

## 2017-01-19 DIAGNOSIS — Z6839 Body mass index (BMI) 39.0-39.9, adult: Secondary | ICD-10-CM

## 2017-01-19 DIAGNOSIS — E669 Obesity, unspecified: Secondary | ICD-10-CM | POA: Diagnosis not present

## 2017-01-19 DIAGNOSIS — E119 Type 2 diabetes mellitus without complications: Secondary | ICD-10-CM

## 2017-01-19 DIAGNOSIS — IMO0001 Reserved for inherently not codable concepts without codable children: Secondary | ICD-10-CM

## 2017-01-20 NOTE — Progress Notes (Signed)
Office: [phone]  /  Fax: [phone]   HPI:   Chief Complaint: OBESITY Steven Hodge is here to discuss his progress with his obesity treatment plan. He is on the  follow the Category 3 plan and is following his eating plan approximately 99 % of the time. He states he is exercising 0 minutes 0 times per week. Steven Hodge is retaining some fluid with recently sprained ankle and has actually done well following his Category 3 plan and weight loss efforts overall. Hunger is controlled and he is doing well with planning meals ahead of time.   His weight is 261 lb (118.4 kg) today and has had a weight loss of 2 pounds over a period of 3 weeks since his last visit. He has lost 11 lbs since starting treatment with [name].  Diabetes II Steven Hodge has a diagnosis of diabetes type II. Steven Hodge states fasting BGs range between 130 and 140's and denies any hypoglycemic episodes, nausea or vomiting. Last A1c was 6.7 on metformin and victozia. He has been working on intensive lifestyle modifications including diet, exercise, and weight loss to help control his blood glucose levels. He is doing well on his diet.    ALLERGIES: No Known Allergies  MEDICATIONS: Current Outpatient Prescriptions on File Prior to Visit  Medication Sig Dispense Refill  . aspirin 325 MG tablet Take 325 mg by mouth daily.    [name] BAYER CONTOUR NEXT TEST test strip USE TO CHECK BLOOD SUGAR UP TO 4 TIMES ADAY 100 each 1  . blood glucose meter kit and supplies Dispense based on patient and insurance preference. Use up to four times daily as directed. (FOR ICD-9 250.00, 250.01). 1 each 0  . fish oil-omega-3 fatty acids 1000 MG capsule Take 2 g by mouth daily.    [name] glucosamine-chondroitin 500-400 MG tablet Take 1 tablet by mouth once.    . liraglutide 18 MG/3ML SOPN Inject 0.1 mLs (0.6 mg total) into the skin every morning. 1 pen 0  . losartan-hydrochlorothiazide (HYZAAR) 50-12.5 MG tablet Take 1 tablet by mouth daily. 90 tablet 1  . metFORMIN  (GLUCOPHAGE) 500 MG tablet Take 1 tablet (500 mg total) by mouth 3 (three) times daily. 90 tablet 0  . Multiple Vitamin (MULTIVITAMIN) tablet Take 1 tablet by mouth daily.    . TURMERIC PO Take 1,000 mg by mouth 2 (two) times daily.    . Vitamin D, Ergocalciferol, (DRISDOL) 50000 units CAPS capsule Take 1 capsule (50,000 Units total) by mouth every 7 (seven) days. 4 capsule 0   No current facility-administered medications on file prior to visit.     PAST MEDICAL HISTORY: Past Medical History:  Diagnosis Date  . Back pain   . Cancer ([place]) 06/09/2006   Basal cell carcinoma scalp;   [name] Diabetes mellitus without complication ([place])   . Hx of blood clots   . Hypertension     PAST SURGICAL HISTORY: Past Surgical History:  Procedure Laterality Date  . basel cell cancer removed  2009  . KNEE SURGERY  2005   ACL repair R    SOCIAL HISTORY: Social History  Substance Use Topics  . Smoking status: Former Research scientist (life sciences)  . Smokeless tobacco: Never Used  . Alcohol use No    FAMILY HISTORY: Family History  Problem Relation Age of Onset  . Diabetes Mother   . Liver disease Mother   . Obesity Mother   . Cancer Father 35       lung cancer  . Diabetes Father   .  Cancer Sister 85       lung cancer  . Heart disease Brother 78       cardiac stenting/CAD  . Cancer Brother        skin cancer    ROS: Review of Systems  Constitutional: Positive for weight loss.  Gastrointestinal: Negative for nausea and vomiting.  Endo/Heme/Allergies:       Negative hypoglycemia    PHYSICAL EXAM: Blood pressure 109/72, pulse 76, temperature 98.1 F (36.7 C), temperature source Oral, height '5\' 8"'  (1.727 m), weight 261 lb (118.4 kg), SpO2 99 %. Body mass index is 39.68 kg/m. Physical Exam  Constitutional: He is oriented to person, place, and time. He appears well-developed and well-nourished.  Cardiovascular: Normal rate.   Pulmonary/Chest: Effort normal.  Musculoskeletal: Normal range of motion.    Neurological: He is oriented to person, place, and time.  Skin: Skin is warm and dry.  Psychiatric: He has a normal mood and affect. His behavior is normal.  Vitals reviewed.   RECENT LABS AND TESTS: BMET    Component Value Date/Time   NA 143 09/18/2016 0956   K 4.1 10/06/2016 1150   CL 100 09/18/2016 0956   CO2 28 09/18/2016 0956   GLUCOSE 139 (H) 09/18/2016 0956   GLUCOSE 146 (H) 04/22/2016 1349   BUN 16 09/18/2016 0956   CREATININE 0.79 09/18/2016 0956   CREATININE 0.88 04/22/2016 1349   CALCIUM 9.5 09/18/2016 0956   GFRNONAA 103 09/18/2016 0956   GFRAA 119 09/18/2016 0956   Lab Results  Component Value Date   HGBA1C 6.7 (H) 09/18/2016   HGBA1C 6.7 (H) 08/20/2016   HGBA1C 6.1 04/22/2016   HGBA1C 6.3 10/02/2015   HGBA1C 5.9 06/15/2015   Lab Results  Component Value Date   INSULIN 32.4 (H) 09/18/2016   CBC    Component Value Date/Time   WBC 9.8 11/25/2016 1119   WBC 8.2 04/22/2016 1349   RBC 4.57 (A) 11/25/2016 1119   RBC 5.24 04/22/2016 1349   HGB 14.4 11/25/2016 1119   HGB 14.8 09/18/2016 0956   HCT 39.9 (A) 11/25/2016 1119   HCT 43.1 09/18/2016 0956   PLT 197 09/18/2016 0956   MCV 87.3 11/25/2016 1119   MCV 87 09/18/2016 0956   MCH 31.5 (A) 11/25/2016 1119   MCH 30.9 04/22/2016 1349   MCHC 36.1 (A) 11/25/2016 1119   MCHC 34.4 04/22/2016 1349   RDW 13.5 09/18/2016 0956   LYMPHSABS 2.1 09/18/2016 0956   MONOABS 656 04/22/2016 1349   EOSABS 0.2 09/18/2016 0956   BASOSABS 0.0 09/18/2016 0956   Iron/TIBC/Ferritin/ %Sat No results found for: IRON, TIBC, FERRITIN, IRONPCTSAT Lipid Panel     Component Value Date/Time   CHOL 195 09/18/2016 0956   TRIG 161 (H) 09/18/2016 0956   HDL 44 09/18/2016 0956   CHOLHDL 4.4 04/22/2016 1349   VLDL 30 04/22/2016 1349   LDLCALC 119 (H) 09/18/2016 0956   Hepatic Function Panel     Component Value Date/Time   PROT 6.9 09/18/2016 0956   ALBUMIN 4.4 09/18/2016 0956   AST 34 09/18/2016 0956   ALT 75 (H)  09/18/2016 0956   ALKPHOS 56 09/18/2016 0956   BILITOT 0.3 09/18/2016 0956      Component Value Date/Time   TSH 2.230 09/18/2016 0956   TSH 2.44 04/22/2016 1349   TSH 1.731 03/09/2015 1524    ASSESSMENT AND PLAN: Type 2 diabetes mellitus without complication, without long-term current use of insulin (HCC)  Class 2 obesity with  serious comorbidity and body mass index (BMI) of 39.0 to 39.9 in adult, unspecified obesity type  PLAN:  Diabetes II Steven Hodge has been given extensive diabetes education by myself today including ideal fasting and post-prandial blood glucose readings, individual ideal Hgb A1c goals and hypoglycemia prevention. We discussed the importance of good blood sugar control to decrease the likelihood of diabetic complications such as nephropathy, neuropathy, limb loss, blindness, coronary artery disease, and death. We discussed the importance of intensive lifestyle modification including diet, exercise and weight loss as the first line treatment for diabetes. Steven Hodge agrees to continue diet and his diabetes medications as prescribed. We will plan to recheck labs in 1 month and he agrees to follow up with our clinic in 2 to 3 weeks.  We spent > than 50% of the 15 minute visit on the counseling as documented in the note  Obesity Steven Hodge is currently in the action stage of change. As such, his goal is to continue with weight loss efforts He has agreed to follow the Category 3 plan Steven Hodge has been instructed to work up to a goal of 150 minutes of combined cardio and strengthening exercise per week for weight loss and overall health benefits. We discussed the following Behavioral Modification Strategies today: increasing lean protein intake and decreasing simple carbohydrates   Steven Hodge has agreed to follow up with our clinic in 2 to 3 weeks. He was informed of the importance of frequent follow up visits to maximize his success with intensive lifestyle modifications for his multiple  health conditions.  I, Doreene Nest, am acting as transcriptionist for Dennard Nip, MD  I have reviewed the above documentation for accuracy and completeness, and I agree with the above. -Dennard Nip, MD    OBESITY BEHAVIORAL INTERVENTION VISIT  Today's visit was # 8 out of 22.  Starting weight: 272 lbs Starting date: 09/18/16 Today's weight : 261 lbs  Today's date: 01/19/2017 Total lbs lost to date: 11 (Patients must lose 7 lbs in the first 6 months to continue with counseling)   ASK: We discussed the diagnosis of obesity with Steven Hodge today and Steven Hodge agreed to give [name] permission to discuss obesity behavioral modification therapy today.  ASSESS: Steven Hodge has the diagnosis of obesity and his BMI today is 39.8 Steven Hodge is in the action stage of change   ADVISE: Steven Hodge was educated on the multiple health risks of obesity as well as the benefit of weight loss to improve his health. He was advised of the need for long term treatment and the importance of lifestyle modifications.  AGREE: Multiple dietary modification options and treatment options were discussed and  Steven Hodge agreed to follow the Category 3 plan We discussed the following Behavioral Modification Strategies today: increasing lean protein intake and decreasing simple carbohydrates

## 2017-01-26 ENCOUNTER — Other Ambulatory Visit (INDEPENDENT_AMBULATORY_CARE_PROVIDER_SITE_OTHER): Payer: Self-pay | Admitting: Family Medicine

## 2017-01-26 ENCOUNTER — Encounter (INDEPENDENT_AMBULATORY_CARE_PROVIDER_SITE_OTHER): Payer: Self-pay

## 2017-01-26 DIAGNOSIS — R7303 Prediabetes: Secondary | ICD-10-CM

## 2017-02-07 ENCOUNTER — Other Ambulatory Visit: Payer: Self-pay | Admitting: *Deleted

## 2017-02-12 ENCOUNTER — Ambulatory Visit (INDEPENDENT_AMBULATORY_CARE_PROVIDER_SITE_OTHER): Payer: BLUE CROSS/BLUE SHIELD | Admitting: Family Medicine

## 2017-02-12 VITALS — BP 121/74 | HR 72 | Temp 97.9°F | Ht 68.0 in | Wt 260.0 lb

## 2017-02-12 DIAGNOSIS — E559 Vitamin D deficiency, unspecified: Secondary | ICD-10-CM | POA: Diagnosis not present

## 2017-02-12 DIAGNOSIS — I10 Essential (primary) hypertension: Secondary | ICD-10-CM

## 2017-02-12 DIAGNOSIS — E669 Obesity, unspecified: Secondary | ICD-10-CM

## 2017-02-12 DIAGNOSIS — Z6839 Body mass index (BMI) 39.0-39.9, adult: Secondary | ICD-10-CM | POA: Diagnosis not present

## 2017-02-12 DIAGNOSIS — E119 Type 2 diabetes mellitus without complications: Secondary | ICD-10-CM | POA: Diagnosis not present

## 2017-02-12 DIAGNOSIS — IMO0001 Reserved for inherently not codable concepts without codable children: Secondary | ICD-10-CM

## 2017-02-12 MED ORDER — LIRAGLUTIDE 18 MG/3ML ~~LOC~~ SOPN: 0.6000 mg | PEN_INJECTOR | Freq: Every morning | SUBCUTANEOUS | 0 refills | Status: DC

## 2017-02-12 MED ORDER — METFORMIN HCL 500 MG PO TABS: 500.0000 mg | ORAL_TABLET | Freq: Three times a day (TID) | ORAL | 0 refills | Status: DC

## 2017-02-12 MED ORDER — VITAMIN D (ERGOCALCIFEROL) 1.25 MG (50000 UNIT) PO CAPS: 50000.0000 [IU] | ORAL_CAPSULE | ORAL | 0 refills | Status: DC

## 2017-02-12 MED ORDER — GLUCOSE BLOOD VI STRP
ORAL_STRIP | 1 refills | Status: DC
Start: 2017-02-12 — End: 2017-06-15

## 2017-02-12 NOTE — Progress Notes (Signed)
Office: [phone]  /  Fax: [phone]   HPI:   Chief Complaint: OBESITY Steven Hodge is here to discuss his progress with his obesity treatment plan. He is on the Category 3 plan and is following his eating plan approximately 100 % of the time. He states he is exercising 0 minutes 0 times per week. Steven Hodge continues to do well with weight loss and is following the plan well. He has increased H2O intake and decreased snacking. His weight is 260 lb (117.9 kg) today and has had a weight loss of 1 pound over a period of 3 weeks since his last visit. He has lost 12 lbs since starting treatment with [name].  Vitamin D deficiency Steven Hodge has a diagnosis of vitamin D deficiency. He is currently stable on vit D and denies nausea, vomiting or muscle weakness. Steven Hodge is due for labs.  Diabetes II Hy has a diagnosis of diabetes type II. Steven Hodge denies any hypoglycemic episodes, nausea or vomiting. Last A1c was at 6.7 and he is doing well on metformin and victoza. He has been working on intensive lifestyle modifications including diet, exercise, and weight loss to help control his blood glucose levels.  Hypertension Steven Hodge is a 56 y.o. male with hypertension and is currently on losartan/HCTZ.  Steven Hodge has had some decrease in blood pressure and he works outside in the heat but is working to stay hydrated . He denies chest pain or shortness of breath on exertion. He is working weight loss to help control his blood pressure with the goal of decreasing his risk of heart attack and stroke. [name] blood pressure is stable today.   ALLERGIES: No Known Allergies  MEDICATIONS: Current Outpatient Prescriptions on File Prior to Visit  Medication Sig Dispense Refill   aspirin 325 MG tablet Take 325 mg by mouth daily.     BAYER CONTOUR NEXT TEST test strip USE TO CHECK BLOOD SUGAR UP TO 4 TIMES ADAY 100 each 1   blood glucose meter kit and supplies Dispense based on patient and insurance preference. Use  up to four times daily as directed. (FOR ICD-9 250.00, 250.01). 1 each 0   fish oil-omega-3 fatty acids 1000 MG capsule Take 2 g by mouth daily.     glucosamine-chondroitin 500-400 MG tablet Take 1 tablet by mouth once.     liraglutide 18 MG/3ML SOPN Inject 0.1 mLs (0.6 mg total) into the skin every morning. 1 pen 0   losartan-hydrochlorothiazide (HYZAAR) 50-12.5 MG tablet Take 1 tablet by mouth daily. (Patient taking differently: Take 0.5 tablets by mouth daily. ) 90 tablet 1   metFORMIN (GLUCOPHAGE) 500 MG tablet Take 1 tablet (500 mg total) by mouth 3 (three) times daily. 90 tablet 0   Multiple Vitamin (MULTIVITAMIN) tablet Take 1 tablet by mouth daily.     SURE COMFORT PEN NEEDLES 32G X 4 MM MISC USE FOR VICTOZA INJECTIONS 100 each 0   TURMERIC PO Take 1,000 mg by mouth 2 (two) times daily.     Vitamin D, Ergocalciferol, (DRISDOL) 50000 units CAPS capsule Take 1 capsule (50,000 Units total) by mouth every 7 (seven) days. 4 capsule 0   No current facility-administered medications on file prior to visit.     PAST MEDICAL HISTORY: Past Medical History:  Diagnosis Date   Back pain    Cancer ([place]) 06/09/2006   Basal cell carcinoma scalp;    Diabetes mellitus without complication (HCC)    Hx of blood clots    Hypertension  PAST SURGICAL HISTORY: Past Surgical History:  Procedure Laterality Date   basel cell cancer removed  2009   KNEE SURGERY  2005   ACL repair R    SOCIAL HISTORY: Social History  Substance Use Topics   Smoking status: Former Smoker   Smokeless tobacco: Never Used   Alcohol use No    FAMILY HISTORY: Family History  Problem Relation Age of Onset   Diabetes Mother    Liver disease Mother    Obesity Mother    Cancer Father 53       lung cancer   Diabetes Father    Cancer Sister 72       lung cancer   Heart disease Brother 71       cardiac stenting/CAD   Cancer Brother        skin cancer    ROS: Review of Systems    Constitutional: Positive for weight loss.  Respiratory: Negative for shortness of breath (on exertion).   Cardiovascular: Negative for chest pain.  Gastrointestinal: Negative for nausea and vomiting.  Musculoskeletal:       Negative muscle weakness  Endo/Heme/Allergies:       Negative hypoglycemia    PHYSICAL EXAM: Blood pressure 121/74, pulse 72, temperature 97.9 F (36.6 C), temperature source Oral, height '5\' 8"'  (1.727 m), weight 260 lb (117.9 kg), SpO2 97 %. Body mass index is 39.53 kg/m. Physical Exam  Constitutional: He is oriented to person, place, and time. He appears well-developed and well-nourished.  Cardiovascular: Normal rate.   Pulmonary/Chest: Effort normal.  Musculoskeletal: Normal range of motion.  Neurological: He is oriented to person, place, and time.  Skin: Skin is warm and dry.  Psychiatric: He has a normal mood and affect. His behavior is normal.  Vitals reviewed.   RECENT LABS AND TESTS: BMET    Component Value Date/Time   NA 143 09/18/2016 0956   K 4.1 10/06/2016 1150   CL 100 09/18/2016 0956   CO2 28 09/18/2016 0956   GLUCOSE 139 (H) 09/18/2016 0956   GLUCOSE 146 (H) 04/22/2016 1349   BUN 16 09/18/2016 0956   CREATININE 0.79 09/18/2016 0956   CREATININE 0.88 04/22/2016 1349   CALCIUM 9.5 09/18/2016 0956   GFRNONAA 103 09/18/2016 0956   GFRAA 119 09/18/2016 0956   Lab Results  Component Value Date   HGBA1C 6.7 (H) 09/18/2016   HGBA1C 6.7 (H) 08/20/2016   HGBA1C 6.1 04/22/2016   HGBA1C 6.3 10/02/2015   HGBA1C 5.9 06/15/2015   Lab Results  Component Value Date   INSULIN 32.4 (H) 09/18/2016   CBC    Component Value Date/Time   WBC 9.8 11/25/2016 1119   WBC 8.2 04/22/2016 1349   RBC 4.57 (A) 11/25/2016 1119   RBC 5.24 04/22/2016 1349   HGB 14.4 11/25/2016 1119   HGB 14.8 09/18/2016 0956   HCT 39.9 (A) 11/25/2016 1119   HCT 43.1 09/18/2016 0956   PLT 197 09/18/2016 0956   MCV 87.3 11/25/2016 1119   MCV 87 09/18/2016 0956   MCH  31.5 (A) 11/25/2016 1119   MCH 30.9 04/22/2016 1349   MCHC 36.1 (A) 11/25/2016 1119   MCHC 34.4 04/22/2016 1349   RDW 13.5 09/18/2016 0956   LYMPHSABS 2.1 09/18/2016 0956   MONOABS 656 04/22/2016 1349   EOSABS 0.2 09/18/2016 0956   BASOSABS 0.0 09/18/2016 0956   Iron/TIBC/Ferritin/ %Sat No results found for: IRON, TIBC, FERRITIN, IRONPCTSAT Lipid Panel     Component Value Date/Time   CHOL 195  09/18/2016 0956   TRIG 161 (H) 09/18/2016 0956   HDL 44 09/18/2016 0956   CHOLHDL 4.4 04/22/2016 1349   VLDL 30 04/22/2016 1349   LDLCALC 119 (H) 09/18/2016 0956   Hepatic Function Panel     Component Value Date/Time   PROT 6.9 09/18/2016 0956   ALBUMIN 4.4 09/18/2016 0956   AST 34 09/18/2016 0956   ALT 75 (H) 09/18/2016 0956   ALKPHOS 56 09/18/2016 0956   BILITOT 0.3 09/18/2016 0956      Component Value Date/Time   TSH 2.230 09/18/2016 0956   TSH 2.44 04/22/2016 1349   TSH 1.731 03/09/2015 1524    ASSESSMENT AND PLAN: Type 2 diabetes mellitus without complication, without long-term current use of insulin ([place]) - Plan: Comprehensive metabolic panel, CBC with Differential/Platelet, Hemoglobin A1c, Insulin, random, Lipid Panel With LDL/HDL Ratio, metFORMIN (GLUCOPHAGE) 500 MG tablet, liraglutide 18 MG/3ML SOPN, glucose blood (BAYER CONTOUR NEXT TEST) test strip  Essential hypertension - Plan: Comprehensive metabolic panel  Vitamin D deficiency - Plan: VITAMIN D 25 Hydroxy (Vit-D Deficiency, Fractures), Vitamin D, Ergocalciferol, (DRISDOL) 50000 units CAPS capsule  Class 2 obesity with serious comorbidity and body mass index (BMI) of 39.0 to 39.9 in adult, unspecified obesity type  PLAN:  Vitamin D Deficiency Braedyn was informed that low vitamin D levels contributes to fatigue and are associated with obesity, breast, and colon cancer. He agrees to continue to take prescription Vit D '@50' ,000 IU every week, we will refill for 1 month and will follow up for routine testing of vitamin  D, at least 2-3 times per year. He was informed of the risk of over-replacement of vitamin D and agrees to not increase his dose unless he discusses this with [name] first. Gerod agrees to follow up with our clinic in 2 to 3 weeks.  Diabetes II Negan has been given extensive diabetes education by myself today including ideal fasting and post-prandial blood glucose readings, individual ideal Hgb A1c goals  and hypoglycemia prevention. We discussed the importance of good blood sugar control to decrease the likelihood of diabetic complications such as nephropathy, neuropathy, limb loss, blindness, coronary artery disease, and death. We discussed the importance of intensive lifestyle modification including diet, exercise and weight loss as the first line treatment for diabetes. Willam agrees to continue victoza, we will refill for 1 month and will refill test strips for 1 month. Azriel agrees to continue metformin, we will refill for 1 month and we will check labs and he will follow up at the agreed upon time.   Hypertension We discussed sodium restriction, working on healthy weight loss, and a regular exercise program as the means to achieve improved blood pressure control. [name] agreed with this plan and agreed to follow up as directed. We will continue to monitor his blood pressure as well as his progress with the above lifestyle modifications. We will check labs and he agrees to decrease losartan/HCTZ to 1/2 pill daily and will watch for signs of hypotension as he continues his lifestyle modifications. We will re-check blood pressure in 2 weeks.  Obesity Charly is currently in the action stage of change. As such, his goal is to continue with weight loss efforts He has agreed to follow the Category 3 plan Jaquan has been instructed to work up to a goal of 150 minutes of combined cardio and strengthening exercise per week for weight loss and overall health benefits. We discussed the following Behavioral  Modification Strategies today: increase H2O intake, increasing lean  protein intake, decreasing simple carbohydrates and decreasing sodium intake  Oluwademilade has agreed to follow up with our clinic in 2 to 3 weeks. He was informed of the importance of frequent follow up visits to maximize his success with intensive lifestyle modifications for his multiple health conditions.  I, Doreene Nest, am acting as transcriptionist for Dennard Nip, MD  I have reviewed the above documentation for accuracy and completeness, and I agree with the above. -Dennard Nip, MD    OBESITY BEHAVIORAL INTERVENTION VISIT  Today's visit was # 9 out of 22.  Starting weight: 272 lbs Starting date: 09/18/16 Today's weight : 260 lbs Today's date: 02/12/2017 Total lbs lost to date: 12 (Patients must lose 7 lbs in the first 6 months to continue with counseling)   ASK: We discussed the diagnosis of obesity with Steven Hodge today and [name] agreed to give [name] permission to discuss obesity behavioral modification therapy today.  ASSESS: Henrick has the diagnosis of obesity and his BMI today is 39.54 Wadsworth is in the action stage of change   ADVISE: Tait was educated on the multiple health risks of obesity as well as the benefit of weight loss to improve his health. He was advised of the need for long term treatment and the importance of lifestyle modifications.  AGREE: Multiple dietary modification options and treatment options were discussed and  Zenith agreed to follow the Category 3 plan We discussed the following Behavioral Modification Strategies today: increase H2O intake, increasing lean protein intake, decreasing simple carbohydrates and decreasing sodium intake

## 2017-02-13 LAB — COMPREHENSIVE METABOLIC PANEL
ALT: 27 IU/L (ref 0–44)
AST: 17 IU/L (ref 0–40)
Albumin/Globulin Ratio: 1.6 (ref 1.2–2.2)
Albumin: 4.2 g/dL (ref 3.5–5.5)
Alkaline Phosphatase: 60 IU/L (ref 39–117)
BUN/Creatinine Ratio: 25 — ABNORMAL HIGH (ref 9–20)
BUN: 21 mg/dL (ref 6–24)
Bilirubin Total: 0.3 mg/dL (ref 0.0–1.2)
CO2: 23 mmol/L (ref 20–29)
Calcium: 9.3 mg/dL (ref 8.7–10.2)
Chloride: 103 mmol/L (ref 96–106)
Creatinine, Ser: 0.83 mg/dL (ref 0.76–1.27)
GFR calc Af Amer: 116 mL/min/{1.73_m2} (ref >59)
GFR calc non Af Amer: 100 mL/min/{1.73_m2} (ref >59)
Globulin, Total: 2.7 g/dL (ref 1.5–4.5)
Glucose: 126 mg/dL — ABNORMAL HIGH (ref 65–99)
Potassium: 4.4 mmol/L (ref 3.5–5.2)
Sodium: 141 mmol/L (ref 134–144)
Total Protein: 6.9 g/dL (ref 6.0–8.5)

## 2017-02-13 LAB — CBC WITH DIFFERENTIAL/PLATELET
Basophils Absolute: 0 10*3/uL (ref 0.0–0.2)
Basos: 0 %
EOS (ABSOLUTE): 0.2 10*3/uL (ref 0.0–0.4)
Eos: 3 %
Hematocrit: 44.5 % (ref 37.5–51.0)
Hemoglobin: 15.5 g/dL (ref 13.0–17.7)
Immature Grans (Abs): 0 10*3/uL (ref 0.0–0.1)
Immature Granulocytes: 0 %
Lymphocytes Absolute: 2.3 10*3/uL (ref 0.7–3.1)
Lymphs: 30 %
MCH: 30.6 pg (ref 26.6–33.0)
MCHC: 34.8 g/dL (ref 31.5–35.7)
MCV: 88 fL (ref 79–97)
Monocytes Absolute: 0.5 10*3/uL (ref 0.1–0.9)
Monocytes: 6 %
Neutrophils Absolute: 4.6 10*3/uL (ref 1.4–7.0)
Neutrophils: 61 %
Platelets: 193 10*3/uL (ref 150–379)
RBC: 5.06 x10E6/uL (ref 4.14–5.80)
RDW: 13.4 % (ref 12.3–15.4)
WBC: 7.5 10*3/uL (ref 3.4–10.8)

## 2017-02-13 LAB — INSULIN, RANDOM: INSULIN: 30.2 u[IU]/mL — ABNORMAL HIGH (ref 2.6–24.9)

## 2017-02-13 LAB — HEMOGLOBIN A1C
Est. average glucose Bld gHb Est-mCnc: 126 mg/dL
Hgb A1c MFr Bld: 6 % — ABNORMAL HIGH (ref 4.8–5.6)

## 2017-02-13 LAB — LIPID PANEL WITH LDL/HDL RATIO
Cholesterol, Total: 180 mg/dL (ref 100–199)
HDL: 45 mg/dL (ref >39)
LDL Calculated: 104 mg/dL — ABNORMAL HIGH (ref 0–99)
LDl/HDL Ratio: 2.3 ratio (ref 0.0–3.6)
Triglycerides: 153 mg/dL — ABNORMAL HIGH (ref 0–149)
VLDL Cholesterol Cal: 31 mg/dL (ref 5–40)

## 2017-02-13 LAB — VITAMIN D 25 HYDROXY (VIT D DEFICIENCY, FRACTURES): Vit D, 25-Hydroxy: 29 ng/mL — ABNORMAL LOW (ref 30.0–100.0)

## 2017-02-15 ENCOUNTER — Other Ambulatory Visit: Payer: Self-pay | Admitting: Family Medicine

## 2017-02-18 ENCOUNTER — Encounter (INDEPENDENT_AMBULATORY_CARE_PROVIDER_SITE_OTHER): Payer: Self-pay | Admitting: Family Medicine

## 2017-03-04 ENCOUNTER — Ambulatory Visit (INDEPENDENT_AMBULATORY_CARE_PROVIDER_SITE_OTHER): Payer: BLUE CROSS/BLUE SHIELD | Admitting: Family Medicine

## 2017-03-04 VITALS — BP 116/74 | HR 78 | Temp 98.3°F | Ht 68.0 in | Wt 264.0 lb

## 2017-03-04 DIAGNOSIS — IMO0001 Reserved for inherently not codable concepts without codable children: Secondary | ICD-10-CM

## 2017-03-04 DIAGNOSIS — I10 Essential (primary) hypertension: Secondary | ICD-10-CM

## 2017-03-04 DIAGNOSIS — E669 Obesity, unspecified: Secondary | ICD-10-CM | POA: Diagnosis not present

## 2017-03-04 DIAGNOSIS — E119 Type 2 diabetes mellitus without complications: Secondary | ICD-10-CM | POA: Diagnosis not present

## 2017-03-04 DIAGNOSIS — E559 Vitamin D deficiency, unspecified: Secondary | ICD-10-CM | POA: Diagnosis not present

## 2017-03-04 DIAGNOSIS — Z6841 Body Mass Index (BMI) 40.0 and over, adult: Secondary | ICD-10-CM

## 2017-03-04 MED ORDER — VITAMIN D (ERGOCALCIFEROL) 1.25 MG (50000 UNIT) PO CAPS: 50000.0000 [IU] | ORAL_CAPSULE | ORAL | 0 refills | Status: DC

## 2017-03-04 MED ORDER — LISINOPRIL 5 MG PO TABS: 5.0000 mg | ORAL_TABLET | Freq: Every day | ORAL | 0 refills | Status: DC

## 2017-03-04 MED ORDER — METFORMIN HCL 500 MG PO TABS: 500.0000 mg | ORAL_TABLET | Freq: Three times a day (TID) | ORAL | 0 refills | Status: DC

## 2017-03-09 NOTE — Progress Notes (Signed)
Office: [phone]  /  Fax: [phone]   HPI:   Chief Complaint: OBESITY Steven Hodge is here to discuss his progress with his obesity treatment plan. He is on the Category 3 plan and is following his eating plan approximately 100 % of the time. He states he is walking for 30 minutes 2 times per week. Steven Hodge is retaining fluid. He is doing well overall on his diet plan and has started to increase his exercise. He is getting ready for hunting season. His weight is 264 lb (119.7 kg) today and he has gained 4 lbs over a period of 2 weeks since his last visit. He has lost 8 lbs since starting treatment with [name].  Vitamin D deficiency Steven Hodge has a diagnosis of vitamin D deficiency. He is currently taking vit D but is not yet at goal and denies nausea, vomiting or muscle weakness.  Diabetes II Steven Hodge has a diagnosis of diabetes type II and is currently stable on Victoza. Steven Hodge denies any hypoglycemic episodes. Last A1c improved but he is still having GI upset (constipation), even on a low dose. He is doing well on his diet plan. He has been working on intensive lifestyle modifications including diet, exercise, and weight loss to help control his blood glucose levels.  Hypertension [name] is a 56 y.o. male with hypertension. His blood pressure has been dropping, so he discontinued his Hyzaar and his blood pressure is still well controlled. He does have diabetes, so he needs renal protection. [name] denies chest pain or shortness of breath on exertion. He is working weight loss to help control his blood pressure with the goal of decreasing his risk of heart attack and stroke. [name] blood pressure is currently controlled.   ALLERGIES: No Known Allergies  MEDICATIONS: Current Outpatient Prescriptions on File Prior to Visit  Medication Sig Dispense Refill   aspirin 325 MG tablet Take 325 mg by mouth daily.     blood glucose meter kit and supplies Dispense based on patient and insurance  preference. Use up to four times daily as directed. (FOR ICD-9 250.00, 250.01). 1 each 0   fish oil-omega-3 fatty acids 1000 MG capsule Take 2 g by mouth daily.     glucosamine-chondroitin 500-400 MG tablet Take 1 tablet by mouth once.     glucose blood (BAYER CONTOUR NEXT TEST) test strip USE TO CHECK BLOOD SUGAR UP TO 4 TIMES ADAY 100 each 1   liraglutide 18 MG/3ML SOPN Inject 0.1 mLs (0.6 mg total) into the skin every morning. 1 pen 0   Multiple Vitamin (MULTIVITAMIN) tablet Take 1 tablet by mouth daily.     SURE COMFORT PEN NEEDLES 32G X 4 MM MISC USE FOR VICTOZA INJECTIONS 100 each 0   TURMERIC PO Take 1,000 mg by mouth 2 (two) times daily.     No current facility-administered medications on file prior to visit.     PAST MEDICAL HISTORY: Past Medical History:  Diagnosis Date   Back pain    Cancer ([place]) 06/09/2006   Basal cell carcinoma scalp;    Diabetes mellitus without complication ([place])    Hx of blood clots    Hypertension     PAST SURGICAL HISTORY: Past Surgical History:  Procedure Laterality Date   basel cell cancer removed  2009   KNEE SURGERY  2005   ACL repair R    SOCIAL HISTORY: Social History  Substance Use Topics   Smoking status: Former Smoker   Smokeless tobacco: Never  Used   Alcohol use No    FAMILY HISTORY: Family History  Problem Relation Age of Onset   Diabetes Mother    Liver disease Mother    Obesity Mother    Cancer Father 28       lung cancer   Diabetes Father    Cancer Sister 48       lung cancer   Heart disease Brother 57       cardiac stenting/CAD   Cancer Brother        skin cancer    ROS: Review of Systems  Constitutional: Negative for weight loss.  Respiratory: Negative for shortness of breath (on exertion).   Cardiovascular: Negative for chest pain.  Gastrointestinal: Positive for constipation. Negative for nausea and vomiting.  Musculoskeletal:       Negative muscle weakness  Endo/Heme/Allergies:        Negative hypoglycemia    PHYSICAL EXAM: Blood pressure 116/74, pulse 78, temperature 98.3 F (36.8 C), temperature source Oral, height '5\' 8"'  (1.727 m), weight 264 lb (119.7 kg), SpO2 98 %. Body mass index is 40.14 kg/m. Physical Exam  Constitutional: He is oriented to person, place, and time. He appears well-developed and well-nourished.  Cardiovascular: Normal rate.   Pulmonary/Chest: Effort normal.  Musculoskeletal: Normal range of motion.  Neurological: He is oriented to person, place, and time.  Skin: Skin is warm and dry.  Psychiatric: He has a normal mood and affect. His behavior is normal.  Vitals reviewed.   RECENT LABS AND TESTS: BMET    Component Value Date/Time   NA 141 02/12/2017 0809   K 4.4 02/12/2017 0809   CL 103 02/12/2017 0809   CO2 23 02/12/2017 0809   GLUCOSE 126 (H) 02/12/2017 0809   GLUCOSE 146 (H) 04/22/2016 1349   BUN 21 02/12/2017 0809   CREATININE 0.83 02/12/2017 0809   CREATININE 0.88 04/22/2016 1349   CALCIUM 9.3 02/12/2017 0809   GFRNONAA 100 02/12/2017 0809   GFRAA 116 02/12/2017 0809   Lab Results  Component Value Date   HGBA1C 6.0 (H) 02/12/2017   HGBA1C 6.7 (H) 09/18/2016   HGBA1C 6.7 (H) 08/20/2016   HGBA1C 6.1 04/22/2016   HGBA1C 6.3 10/02/2015   Lab Results  Component Value Date   INSULIN 30.2 (H) 02/12/2017   INSULIN 32.4 (H) 09/18/2016   CBC    Component Value Date/Time   WBC 7.5 02/12/2017 0809   WBC 9.8 11/25/2016 1119   WBC 8.2 04/22/2016 1349   RBC 5.06 02/12/2017 0809   RBC 4.57 (A) 11/25/2016 1119   RBC 5.24 04/22/2016 1349   HGB 15.5 02/12/2017 0809   HCT 44.5 02/12/2017 0809   PLT 193 02/12/2017 0809   MCV 88 02/12/2017 0809   MCH 30.6 02/12/2017 0809   MCH 31.5 (A) 11/25/2016 1119   MCH 30.9 04/22/2016 1349   MCHC 34.8 02/12/2017 0809   MCHC 36.1 (A) 11/25/2016 1119   MCHC 34.4 04/22/2016 1349   RDW 13.4 02/12/2017 0809   LYMPHSABS 2.3 02/12/2017 0809   MONOABS 656 04/22/2016 1349   EOSABS 0.2  02/12/2017 0809   BASOSABS 0.0 02/12/2017 0809   Iron/TIBC/Ferritin/ %Sat No results found for: IRON, TIBC, FERRITIN, IRONPCTSAT Lipid Panel     Component Value Date/Time   CHOL 180 02/12/2017 0809   TRIG 153 (H) 02/12/2017 0809   HDL 45 02/12/2017 0809   CHOLHDL 4.4 04/22/2016 1349   VLDL 30 04/22/2016 1349   LDLCALC 104 (H) 02/12/2017 0809   Hepatic  Function Panel     Component Value Date/Time   PROT 6.9 02/12/2017 0809   ALBUMIN 4.2 02/12/2017 0809   AST 17 02/12/2017 0809   ALT 27 02/12/2017 0809   ALKPHOS 60 02/12/2017 0809   BILITOT 0.3 02/12/2017 0809      Component Value Date/Time   TSH 2.230 09/18/2016 0956   TSH 2.44 04/22/2016 1349   TSH 1.731 03/09/2015 1524    ASSESSMENT AND PLAN: Type 2 diabetes mellitus without complication, without long-term current use of insulin ([place]) - Plan: metFORMIN (GLUCOPHAGE) 500 MG tablet, lisinopril (PRINIVIL,ZESTRIL) 5 MG tablet  Essential hypertension  Vitamin D deficiency - Plan: Vitamin D, Ergocalciferol, (DRISDOL) 50000 units CAPS capsule  Class 3 obesity with serious comorbidity and body mass index (BMI) of 40.0 to 44.9 in adult, unspecified obesity type ([place])  PLAN:  Vitamin D Deficiency Kaylob was informed that low vitamin D levels contributes to fatigue and are associated with obesity, breast, and colon cancer. He agrees to continue to take prescription Vit D '@50' ,000 IU every week, we will refill for 1 month and will follow up for routine testing of vitamin D, at least 2-3 times per year. He was informed of the risk of over-replacement of vitamin D and agrees to not increase his dose unless he discusses this with [name] first. Purl agrees to follow up with our clinic in 3 weeks.  Diabetes II Mckyle has been given extensive diabetes education by myself today including ideal fasting and post-prandial blood glucose readings, individual ideal Hgb A1c goals  and hypoglycemia prevention. We discussed the importance of good blood  sugar control to decrease the likelihood of diabetic complications such as nephropathy, neuropathy, limb loss, blindness, coronary artery disease, and death. We discussed the importance of intensive lifestyle modification including diet, exercise and weight loss as the first line treatment for diabetes. Terence agrees to decrease Victoza to 0.3 mg for 2 more weeks and we will refill metformin for 1 month. Dory agrees to follow up at the agreed upon time.  Hypertension We discussed sodium restriction, working on healthy weight loss, and a regular exercise program as the means to achieve improved blood pressure control. [name] agreed with this plan and agreed to follow up as directed. We will recheck blood pressure in 2 to 3 weeks and will continue to monitor his blood pressure as well as his progress with the above lifestyle modifications. He agrees to discontinue Hyzaar and start Lisinopril 5 mg qd #30 with no refills and will watch for signs of hypotension as he continues his lifestyle modifications. Tyaire agrees to follow up with our clinic in 3 weeks.  Obesity Jesiah is currently in the action stage of change. As such, his goal is to continue with weight loss efforts He has agreed to follow the Category 3 plan Ismail has been instructed to work up to a goal of 150 minutes of combined cardio and strengthening exercise per week or increase cardio to 30 minutes 3 to 4 times per week for weight loss and overall health benefits. We discussed the following Behavioral Modification Strategies today: increasing lean protein intake, decreasing simple carbohydrates and decrease eating out  Augustin has agreed to follow up with our clinic in 3 weeks. He was informed of the importance of frequent follow up visits to maximize his success with intensive lifestyle modifications for his multiple health conditions.  I, Doreene Nest, am acting as transcriptionist for Dennard Nip, MD  I have reviewed the above  documentation for  accuracy and completeness, and I agree with the above. -Dennard Nip, MD   OBESITY BEHAVIORAL INTERVENTION VISIT  Today's visit was # 10 out of 22.  Starting weight: 272 lbs Starting date: 09/18/16 Today's weight : 264 lbs Today's date: 03/04/2017 Total lbs lost to date: 8 (Patients must lose 7 lbs in the first 6 months to continue with counseling)   ASK: We discussed the diagnosis of obesity with [name] today and [name] agreed to give [name] permission to discuss obesity behavioral modification therapy today.  ASSESS: Jervon has the diagnosis of obesity and his BMI today is 40.15 Gregroy is in the action stage of change   ADVISE: Torrie was educated on the multiple health risks of obesity as well as the benefit of weight loss to improve his health. He was advised of the need for long term treatment and the importance of lifestyle modifications.  AGREE: Multiple dietary modification options and treatment options were discussed and  Marguerite agreed to follow the Category 3 plan We discussed the following Behavioral Modification Strategies today: increasing lean protein intake, decreasing simple carbohydrates and decrease eating out

## 2017-03-30 ENCOUNTER — Ambulatory Visit (INDEPENDENT_AMBULATORY_CARE_PROVIDER_SITE_OTHER): Payer: BLUE CROSS/BLUE SHIELD | Admitting: Family Medicine

## 2017-03-30 VITALS — BP 117/75 | HR 78 | Temp 98.1°F | Ht 68.0 in | Wt 267.0 lb

## 2017-03-30 DIAGNOSIS — E559 Vitamin D deficiency, unspecified: Secondary | ICD-10-CM | POA: Diagnosis not present

## 2017-03-30 DIAGNOSIS — E119 Type 2 diabetes mellitus without complications: Secondary | ICD-10-CM

## 2017-03-30 DIAGNOSIS — Z6841 Body Mass Index (BMI) 40.0 and over, adult: Secondary | ICD-10-CM | POA: Diagnosis not present

## 2017-03-30 DIAGNOSIS — I10 Essential (primary) hypertension: Secondary | ICD-10-CM | POA: Diagnosis not present

## 2017-03-30 MED ORDER — LISINOPRIL 5 MG PO TABS: 5.0000 mg | ORAL_TABLET | Freq: Every day | ORAL | 0 refills | Status: DC

## 2017-03-30 MED ORDER — VITAMIN D (ERGOCALCIFEROL) 1.25 MG (50000 UNIT) PO CAPS: 50000.0000 [IU] | ORAL_CAPSULE | ORAL | 0 refills | Status: DC

## 2017-03-30 NOTE — Progress Notes (Signed)
Office: [phone]  /  Fax: [phone]   HPI:   Chief Complaint: OBESITY Steven Hodge is here to discuss his progress with his obesity treatment plan. He is on the  follow the Category 3 plan and is following his eating plan approximately 100 % of the time. He states he is walking for 30 minutes 2 to 3 times per week. Steven Hodge states he was on vacation and notes increased edema when driving on long trips. He states taking tumeric pills really helps with this. His weight is 267 lb (121.1 kg) today and has had a weight gain of 3 pounds over a period of 4 weeks since his last visit. He has lost 5 lbs since starting treatment with [name].  Vitamin D deficiency Steven Hodge has a diagnosis of vitamin D deficiency. He is currently stable on vit D, not yet at goal and denies nausea, vomiting or muscle weakness.  Diabetes II Steven Hodge has a diagnosis of diabetes type II and he decreased Victoza to 0.3 mg due to GI upset and this has resolved. Steven Hodge denies any hypoglycemic episodes. Last A1c was at 6.0 He has been working on intensive lifestyle modifications including diet, exercise, and weight loss to help control his blood glucose levels.  Hypertension DARUS HERSHMAN is a 56 y.o. male with hypertension. His blood pressure is stable on Lisinopril and he has no cough. [name] denies chest pain or headache. He is working weight loss to help control his blood pressure with the goal of decreasing his risk of heart attack and stroke. [name] blood pressure is currently controlled.  ALLERGIES: No Known Allergies  MEDICATIONS: Current Outpatient Prescriptions on File Prior to Visit  Medication Sig Dispense Refill   aspirin 325 MG tablet Take 325 mg by mouth daily.     blood glucose meter kit and supplies Dispense based on patient and insurance preference. Use up to four times daily as directed. (FOR ICD-9 250.00, 250.01). 1 each 0   fish oil-omega-3 fatty acids 1000 MG capsule Take 2 g by mouth daily.      glucosamine-chondroitin 500-400 MG tablet Take 1 tablet by mouth once.     glucose blood (BAYER CONTOUR NEXT TEST) test strip USE TO CHECK BLOOD SUGAR UP TO 4 TIMES ADAY 100 each 1   liraglutide 18 MG/3ML SOPN Inject 0.1 mLs (0.6 mg total) into the skin every morning. 1 pen 0   lisinopril (PRINIVIL,ZESTRIL) 5 MG tablet Take 1 tablet (5 mg total) by mouth daily. 30 tablet 0   metFORMIN (GLUCOPHAGE) 500 MG tablet Take 1 tablet (500 mg total) by mouth 3 (three) times daily. 90 tablet 0   Multiple Vitamin (MULTIVITAMIN) tablet Take 1 tablet by mouth daily.     SURE COMFORT PEN NEEDLES 32G X 4 MM MISC USE FOR VICTOZA INJECTIONS 100 each 0   TURMERIC PO Take 1,000 mg by mouth 2 (two) times daily.     Vitamin D, Ergocalciferol, (DRISDOL) 50000 units CAPS capsule Take 1 capsule (50,000 Units total) by mouth every 7 (seven) days. 4 capsule 0   No current facility-administered medications on file prior to visit.     PAST MEDICAL HISTORY: Past Medical History:  Diagnosis Date   Back pain    Cancer ([place]) 06/09/2006   Basal cell carcinoma scalp;    Diabetes mellitus without complication ([place])    Hx of blood clots    Hypertension     PAST SURGICAL HISTORY: Past Surgical History:  Procedure Laterality Date  basel cell cancer removed  2009   KNEE SURGERY  2005   ACL repair R    SOCIAL HISTORY: Social History  Substance Use Topics   Smoking status: Former Smoker   Smokeless tobacco: Never Used   Alcohol use No    FAMILY HISTORY: Family History  Problem Relation Age of Onset   Diabetes Mother    Liver disease Mother    Obesity Mother    Cancer Father 25       lung cancer   Diabetes Father    Cancer Sister 8       lung cancer   Heart disease Brother 18       cardiac stenting/CAD   Cancer Brother        skin cancer    ROS: Review of Systems  Constitutional: Negative for weight loss.  Respiratory: Negative for cough.   Cardiovascular: Negative for  chest pain.  Gastrointestinal: Negative for nausea and vomiting.  Musculoskeletal:       Negative muscle weakness  Neurological: Negative for headaches.  Endo/Heme/Allergies:       Negative hypoglycemia    PHYSICAL EXAM: Blood pressure 117/75, pulse 78, temperature 98.1 F (36.7 C), temperature source Oral, height _0  (1.727 m), weight 267 lb (121.1 kg), SpO2 98 %. Body mass index is 40.6 kg/m. Physical Exam  Constitutional: He is oriented to person, place, and time. He appears well-developed and well-nourished.  Cardiovascular: Normal rate.   Pulmonary/Chest: Effort normal.  Musculoskeletal: Normal range of motion.  Neurological: He is oriented to person, place, and time.  Skin: Skin is warm and dry.  Psychiatric: He has a normal mood and affect. His behavior is normal.  Vitals reviewed.   RECENT LABS AND TESTS: BMET    Component Value Date/Time   NA 141 02/12/2017 0809   K 4.4 02/12/2017 0809   CL 103 02/12/2017 0809   CO2 23 02/12/2017 0809   GLUCOSE 126 (H) 02/12/2017 0809   GLUCOSE 146 (H) 04/22/2016 1349   BUN 21 02/12/2017 0809   CREATININE 0.83 02/12/2017 0809   CREATININE 0.88 04/22/2016 1349   CALCIUM 9.3 02/12/2017 0809   GFRNONAA 100 02/12/2017 0809   GFRAA 116 02/12/2017 0809   Lab Results  Component Value Date   HGBA1C 6.0 (H) 02/12/2017   HGBA1C 6.7 (H) 09/18/2016   HGBA1C 6.7 (H) 08/20/2016   HGBA1C 6.1 04/22/2016   HGBA1C 6.3 10/02/2015   Lab Results  Component Value Date   INSULIN 30.2 (H) 02/12/2017   INSULIN 32.4 (H) 09/18/2016   CBC    Component Value Date/Time   WBC 7.5 02/12/2017 0809   WBC 9.8 11/25/2016 1119   WBC 8.2 04/22/2016 1349   RBC 5.06 02/12/2017 0809   RBC 4.57 (A) 11/25/2016 1119   RBC 5.24 04/22/2016 1349   HGB 15.5 02/12/2017 0809   HCT 44.5 02/12/2017 0809   PLT 193 02/12/2017 0809   MCV 88 02/12/2017 0809   MCH 30.6 02/12/2017 0809   MCH 31.5 (A) 11/25/2016 1119   MCH 30.9 04/22/2016 1349   MCHC 34.8  02/12/2017 0809   MCHC 36.1 (A) 11/25/2016 1119   MCHC 34.4 04/22/2016 1349   RDW 13.4 02/12/2017 0809   LYMPHSABS 2.3 02/12/2017 0809   MONOABS 656 04/22/2016 1349   EOSABS 0.2 02/12/2017 0809   BASOSABS 0.0 02/12/2017 0809   Iron/TIBC/Ferritin/ %Sat No results found for: IRON, TIBC, FERRITIN, IRONPCTSAT Lipid Panel     Component Value Date/Time   CHOL 180  02/12/2017 0809   TRIG 153 (H) 02/12/2017 0809   HDL 45 02/12/2017 0809   CHOLHDL 4.4 04/22/2016 1349   VLDL 30 04/22/2016 1349   LDLCALC 104 (H) 02/12/2017 0809   Hepatic Function Panel     Component Value Date/Time   PROT 6.9 02/12/2017 0809   ALBUMIN 4.2 02/12/2017 0809   AST 17 02/12/2017 0809   ALT 27 02/12/2017 0809   ALKPHOS 60 02/12/2017 0809   BILITOT 0.3 02/12/2017 0809      Component Value Date/Time   TSH 2.230 09/18/2016 0956   TSH 2.44 04/22/2016 1349   TSH 1.731 03/09/2015 1524    ASSESSMENT AND PLAN: Essential hypertension  Type 2 diabetes mellitus without complication, without long-term current use of insulin (HCC) - Plan: lisinopril (PRINIVIL,ZESTRIL) 5 MG tablet  Vitamin D deficiency - Plan: Vitamin D, Ergocalciferol, (DRISDOL) 50000 units CAPS capsule  Class 3 severe obesity with serious comorbidity and body mass index (BMI) of 40.0 to 44.9 in adult, unspecified obesity type ([place])  PLAN:  Vitamin D Deficiency Steven Hodge was informed that low vitamin D levels contributes to fatigue and are associated with obesity, breast, and colon cancer. He agrees to continue to take prescription Vit D _0 ,000 IU every week, we will refill for 1 month. We will recheck labs in 1 month and will follow up for routine testing of vitamin D, at least 2-3 times per year. He was informed of the risk of over-replacement of vitamin D and agrees to not increase his dose unless he discusses this with [name] first. Steven Hodge agrees to follow up with our clinic in 2 weeks.  Diabetes II Steven Hodge has been given extensive diabetes education  by myself today including ideal fasting and post-prandial blood glucose readings, individual ideal Hgb A1c goals  and hypoglycemia prevention. We discussed the importance of good blood sugar control to decrease the likelihood of diabetic complications such as nephropathy, neuropathy, limb loss, blindness, coronary artery disease, and death. We discussed the importance of intensive lifestyle modification including diet, exercise and weight loss as the first line treatment for diabetes. Steven Hodge agrees to continue Victoza at 0.3 mg qd for now and may try to increase dose if he is not losing weight. Steven Hodge will follow up at the agreed upon time.  Hypertension We discussed sodium restriction, working on healthy weight loss, and a regular exercise program as the means to achieve improved blood pressure control. Steven Hodge Resides agreed with this plan and agreed to follow up as directed. We will continue to monitor his blood pressure as well as his progress with the above lifestyle modifications. He agrees to continue Lisinopril 5 mg qd, we will refill for 1 month and he will watch for signs of hypotension as he continues his lifestyle modifications.  Obesity Steven Hodge is currently in the action stage of change. As such, his goal is to continue with weight loss efforts He has agreed to change to follow a lower carbohydrate, vegetable and lean protein rich diet plan Steven Hodge has been instructed to work up to a goal of 150 minutes of combined cardio and strengthening exercise per week for weight loss and overall health benefits. We discussed the following Behavioral Modification Strategies today: increase H2O intake, increasing lean protein intake, decreasing simple carbohydrates, increasing vegetables and decreasing sodium intake  Steven Hodge has agreed to follow up with our clinic in 2 weeks. He was informed of the importance of frequent follow up visits to maximize his success with intensive lifestyle modifications for his multiple  health conditions.  I, Doreene Nest, am acting as transcriptionist for Dennard Nip, MD  I have reviewed the above documentation for accuracy and completeness, and I agree with the above. -Dennard Nip, MD    OBESITY BEHAVIORAL INTERVENTION VISIT  Today's visit was # 11 out of 22.  Starting weight: 272 lbs Starting date: 09/18/16 Today's weight : 267 lbs  Today's date: 03/30/2017 Total lbs lost to date: 5 (Patients must lose 7 lbs in the first 6 months to continue with counseling)   ASK: We discussed the diagnosis of obesity with [name] today and Steven Hodge Resides agreed to give [name] permission to discuss obesity behavioral modification therapy today.  ASSESS: Steven Hodge has the diagnosis of obesity and his BMI today is 40.61 Steven Hodge is in the action stage of change   ADVISE: Barry was educated on the multiple health risks of obesity as well as the benefit of weight loss to improve his health. He was advised of the need for long term treatment and the importance of lifestyle modifications.  AGREE: Multiple dietary modification options and treatment options were discussed and  Steven Hodge agreed to change to follow a lower carbohydrate, vegetable and lean protein rich diet plan We discussed the following Behavioral Modification Strategies today: increase H2O intake, increasing lean protein intake, decreasing simple carbohydrates, increasing vegetables and decreasing sodium intake

## 2017-04-01 ENCOUNTER — Encounter (INDEPENDENT_AMBULATORY_CARE_PROVIDER_SITE_OTHER): Payer: Self-pay | Admitting: Family Medicine

## 2017-04-13 ENCOUNTER — Ambulatory Visit (INDEPENDENT_AMBULATORY_CARE_PROVIDER_SITE_OTHER): Payer: BLUE CROSS/BLUE SHIELD | Admitting: Physician Assistant

## 2017-04-13 VITALS — BP 130/76 | HR 69 | Temp 98.5°F | Ht 68.0 in | Wt 259.0 lb

## 2017-04-13 DIAGNOSIS — Z6839 Body mass index (BMI) 39.0-39.9, adult: Secondary | ICD-10-CM | POA: Diagnosis not present

## 2017-04-13 DIAGNOSIS — I10 Essential (primary) hypertension: Secondary | ICD-10-CM

## 2017-04-13 DIAGNOSIS — E119 Type 2 diabetes mellitus without complications: Secondary | ICD-10-CM | POA: Diagnosis not present

## 2017-04-13 DIAGNOSIS — E559 Vitamin D deficiency, unspecified: Secondary | ICD-10-CM | POA: Diagnosis not present

## 2017-04-13 MED ORDER — LISINOPRIL 5 MG PO TABS: 5.0000 mg | ORAL_TABLET | Freq: Every day | ORAL | 0 refills | Status: DC

## 2017-04-13 MED ORDER — VITAMIN D (ERGOCALCIFEROL) 1.25 MG (50000 UNIT) PO CAPS: 50000.0000 [IU] | ORAL_CAPSULE | ORAL | 0 refills | Status: DC

## 2017-04-13 MED ORDER — METFORMIN HCL 500 MG PO TABS: 500.0000 mg | ORAL_TABLET | Freq: Three times a day (TID) | ORAL | 0 refills | Status: DC

## 2017-04-14 NOTE — Progress Notes (Signed)
Office: [phone]  /  Fax: [phone]   HPI:   Chief Complaint: OBESITY Steven Hodge is here to discuss his progress with his obesity treatment plan. He is on the lower carbohydrate, vegetable and lean protein rich diet plan and is following his eating plan approximately 100 % of the time. He states he is walking for 30 minutes 3 times per week. Steven Hodge continues to do well with weight loss. He tolerated the low carbohydrate plan well. Steven Hodge would like to introduce more variety at his meals and would like more meal planning ideas. His weight is 259 lb (117.5 kg) today and has had a weight loss of 8 pounds over a period of 2 weeks since his last visit. He has lost 13 lbs since starting treatment with Steven Hodge.  Vitamin D deficiency [name] has a diagnosis of vitamin D deficiency. He is currently taking vit D and denies nausea, vomiting or muscle weakness.  Diabetes II Steven Hodge has a diagnosis of diabetes type II. Steven Hodge states fasting BGs range in the average of 160's and he denies any hypoglycemic episodes. He has been working on intensive lifestyle modifications including diet, exercise, and weight loss to help control his blood glucose levels.  Hypertension Steven Hodge is a 56 y.o. male with hypertension. Steven Hodge denies chest pain or shortness of breath on exertion. He is working weight loss to help control his blood pressure with the goal of decreasing his risk of heart attack and stroke. Steven Hodge blood pressure is currently stable.   ALLERGIES: No Known Allergies  MEDICATIONS: Current Outpatient Medications on File Prior to Visit  Medication Sig Dispense Refill  . aspirin 325 MG tablet Take 325 mg by mouth daily.    . blood glucose meter kit and supplies Dispense based on patient and insurance preference. Use up to four times daily as directed. (FOR ICD-9 250.00, 250.01). 1 each 0  . fish oil-omega-3 fatty acids 1000 MG capsule Take 2 g by mouth daily.    Steven Hodge glucosamine-chondroitin 500-400  MG tablet Take 1 tablet by mouth once.    Steven Hodge glucose blood (BAYER CONTOUR NEXT TEST) test strip USE TO CHECK BLOOD SUGAR UP TO 4 TIMES ADAY 100 each 1  . liraglutide 18 MG/3ML SOPN Inject 0.1 mLs (0.6 mg total) into the skin every morning. 1 pen 0  . Multiple Vitamin (MULTIVITAMIN) tablet Take 1 tablet by mouth daily.    . SURE COMFORT PEN NEEDLES 32G X 4 MM MISC USE FOR VICTOZA INJECTIONS 100 each 0  . TURMERIC PO Take 1,000 mg by mouth 2 (two) times daily.     No current facility-administered medications on file prior to visit.     PAST MEDICAL HISTORY: Past Medical History:  Diagnosis Date  . Back pain   . Cancer ([place]) 06/09/2006   Basal cell carcinoma scalp;   Steven Hodge Diabetes mellitus without complication ([place])   . Hx of blood clots   . Hypertension     PAST SURGICAL HISTORY: Past Surgical History:  Procedure Laterality Date  . basel cell cancer removed  2009  . KNEE SURGERY  2005   ACL repair R    SOCIAL HISTORY: Social History   Tobacco Use  . Smoking status: Former Research scientist (life sciences)  . Smokeless tobacco: Never Used  Substance Use Topics  . Alcohol use: No  . Drug use: No    FAMILY HISTORY: Family History  Problem Relation Age of Onset  . Diabetes Mother   . Liver disease Mother   .  Obesity Mother   . Cancer Father 23       lung cancer  . Diabetes Father   . Cancer Sister 73       lung cancer  . Heart disease Brother 2       cardiac stenting/CAD  . Cancer Brother        skin cancer    ROS: Review of Systems  Constitutional: Positive for weight loss.  Respiratory: Negative for shortness of breath (on exertion).   Cardiovascular: Negative for chest pain.  Gastrointestinal: Negative for nausea and vomiting.  Musculoskeletal:       Negative muscle weakness  Endo/Heme/Allergies:       Negative hypoglycemia    PHYSICAL EXAM: Blood pressure 130/76, pulse 69, temperature 98.5 F (36.9 C), temperature source Oral, height _0  (1.727 m), weight 259 lb (117.5 kg),  SpO2 97 %. Body mass index is 39.38 kg/m. Physical Exam  Constitutional: He is oriented to person, place, and time. He appears well-developed and well-nourished.  Cardiovascular: Normal rate.  Pulmonary/Chest: Effort normal.  Musculoskeletal: Normal range of motion.  Neurological: He is oriented to person, place, and time.  Skin: Skin is warm and dry.  Psychiatric: He has a normal mood and affect. His behavior is normal.  Vitals reviewed.   RECENT LABS AND TESTS: BMET    Component Value Date/Time   NA 141 02/12/2017 0809   K 4.4 02/12/2017 0809   CL 103 02/12/2017 0809   CO2 23 02/12/2017 0809   GLUCOSE 126 (H) 02/12/2017 0809   GLUCOSE 146 (H) 04/22/2016 1349   BUN 21 02/12/2017 0809   CREATININE 0.83 02/12/2017 0809   CREATININE 0.88 04/22/2016 1349   CALCIUM 9.3 02/12/2017 0809   GFRNONAA 100 02/12/2017 0809   GFRAA 116 02/12/2017 0809   Lab Results  Component Value Date   HGBA1C 6.0 (H) 02/12/2017   HGBA1C 6.7 (H) 09/18/2016   HGBA1C 6.7 (H) 08/20/2016   HGBA1C 6.1 04/22/2016   HGBA1C 6.3 10/02/2015   Lab Results  Component Value Date   INSULIN 30.2 (H) 02/12/2017   INSULIN 32.4 (H) 09/18/2016   CBC    Component Value Date/Time   WBC 7.5 02/12/2017 0809   WBC 9.8 11/25/2016 1119   WBC 8.2 04/22/2016 1349   RBC 5.06 02/12/2017 0809   RBC 4.57 (A) 11/25/2016 1119   RBC 5.24 04/22/2016 1349   HGB 15.5 02/12/2017 0809   HCT 44.5 02/12/2017 0809   PLT 193 02/12/2017 0809   MCV 88 02/12/2017 0809   MCH 30.6 02/12/2017 0809   MCH 31.5 (A) 11/25/2016 1119   MCH 30.9 04/22/2016 1349   MCHC 34.8 02/12/2017 0809   MCHC 36.1 (A) 11/25/2016 1119   MCHC 34.4 04/22/2016 1349   RDW 13.4 02/12/2017 0809   LYMPHSABS 2.3 02/12/2017 0809   MONOABS 656 04/22/2016 1349   EOSABS 0.2 02/12/2017 0809   BASOSABS 0.0 02/12/2017 0809   Iron/TIBC/Ferritin/ %Sat No results found for: IRON, TIBC, FERRITIN, IRONPCTSAT Lipid Panel     Component Value Date/Time   CHOL 180  02/12/2017 0809   TRIG 153 (H) 02/12/2017 0809   HDL 45 02/12/2017 0809   CHOLHDL 4.4 04/22/2016 1349   VLDL 30 04/22/2016 1349   LDLCALC 104 (H) 02/12/2017 0809   Hepatic Function Panel     Component Value Date/Time   PROT 6.9 02/12/2017 0809   ALBUMIN 4.2 02/12/2017 0809   AST 17 02/12/2017 0809   ALT 27 02/12/2017 0809   ALKPHOS  60 02/12/2017 0809   BILITOT 0.3 02/12/2017 0809      Component Value Date/Time   TSH 2.230 09/18/2016 0956   TSH 2.44 04/22/2016 1349   TSH 1.731 03/09/2015 1524    ASSESSMENT AND PLAN: Type 2 diabetes mellitus without complication, without long-term current use of insulin (HCC) - Plan: lisinopril (PRINIVIL,ZESTRIL) 5 MG tablet, metFORMIN (GLUCOPHAGE) 500 MG tablet  Essential hypertension  Vitamin D deficiency - Plan: Vitamin D, Ergocalciferol, (DRISDOL) 50000 units CAPS capsule  Class 2 severe obesity with serious comorbidity and body mass index (BMI) of 39.0 to 39.9 in adult, unspecified obesity type ([place])  PLAN:  Vitamin D Deficiency Verlyn was informed that low vitamin D levels contributes to fatigue and are associated with obesity, breast, and colon cancer. He agrees to continue to take prescription Vit D _0 ,000 IU every week and will follow up for routine testing of vitamin D, at least 2-3 times per year. He was informed of the risk of over-replacement of vitamin D and agrees to not increase his dose unless he discusses this with Steven Hodge first.  Diabetes II Jaquail has been given extensive diabetes education by myself today including ideal fasting and post-prandial blood glucose readings, individual ideal Hgb A1c goals  and hypoglycemia prevention. We discussed the importance of good blood sugar control to decrease the likelihood of diabetic complications such as nephropathy, neuropathy, limb loss, blindness, coronary artery disease, and death. We discussed the importance of intensive lifestyle modification including diet, exercise and weight loss as  the first line treatment for diabetes. Deepak agrees to continue metformin 200 mg tid #90 with no refills and will follow up at the agreed upon time.  Hypertension We discussed sodium restriction, working on healthy weight loss, and a regular exercise program as the means to achieve improved blood pressure control. [name] agreed with this plan and agreed to follow up as directed. We will continue to monitor his blood pressure as well as his progress with the above lifestyle modifications. He agrees to continue lisinopril 5 mg #30 with no refills and will watch for signs of hypotension as he continues his lifestyle modifications.  Obesity Cheney is currently in the action stage of change. As such, his goal is to continue with weight loss efforts He has agreed to keep a food journal with 1400 to 1550 calories and 90 grams of protein daily Ryen has been instructed to work up to a goal of 150 minutes of combined cardio and strengthening exercise per week for weight loss and overall health benefits. We discussed the following Behavioral Modification Strategies today: increasing lean protein intake and work on meal planning and easy cooking plans  Jimmylee has agreed to follow up with our clinic in 3 weeks. He was informed of the importance of frequent follow up visits to maximize his success with intensive lifestyle modifications for his multiple health conditions.  I, Doreene Nest, am acting as transcriptionist for Lacy Duverney, PA-C  I have reviewed the above documentation for accuracy and completeness, and I agree with the above. -Lacy Duverney, PA-C  I have reviewed the above note and agree with the plan. -Dennard Nip, MD  OBESITY BEHAVIORAL INTERVENTION VISIT  Today's visit was # 12 out of 22.  Starting weight: 272 lbs Starting date: 09/18/16 Today's weight : 259 lbs Today's date: 04/14/2017 Total lbs lost to date: 13 (Patients must lose 7 lbs in the first 6 months to continue with  counseling)   ASK: We discussed the diagnosis of obesity  with Steven Hodge today and [name] agreed to give Steven Hodge permission to discuss obesity behavioral modification therapy today.  ASSESS: Deontrae has the diagnosis of obesity and his BMI today is 39.39 Rachard is in the action stage of change   ADVISE: [place] was educated on the multiple health risks of obesity as well as the benefit of weight loss to improve his health. He was advised of the need for long term treatment and the importance of lifestyle modifications.  AGREE: Multiple dietary modification options and treatment options were discussed and  Aurel agreed to keep a food journal with 1400 to 1550 calories and 90 grams of protein daily We discussed the following Behavioral Modification Strategies today: increasing lean protein intake and work on meal planning and easy cooking plans

## 2017-05-04 ENCOUNTER — Other Ambulatory Visit (INDEPENDENT_AMBULATORY_CARE_PROVIDER_SITE_OTHER): Payer: Self-pay | Admitting: Family Medicine

## 2017-05-04 ENCOUNTER — Ambulatory Visit (INDEPENDENT_AMBULATORY_CARE_PROVIDER_SITE_OTHER): Payer: BLUE CROSS/BLUE SHIELD | Admitting: Physician Assistant

## 2017-05-04 VITALS — BP 117/72 | HR 76 | Temp 98.1°F | Ht 68.0 in | Wt 261.0 lb

## 2017-05-04 DIAGNOSIS — Z6839 Body mass index (BMI) 39.0-39.9, adult: Secondary | ICD-10-CM | POA: Diagnosis not present

## 2017-05-04 DIAGNOSIS — E559 Vitamin D deficiency, unspecified: Secondary | ICD-10-CM

## 2017-05-04 DIAGNOSIS — E119 Type 2 diabetes mellitus without complications: Secondary | ICD-10-CM

## 2017-05-04 DIAGNOSIS — I10 Essential (primary) hypertension: Secondary | ICD-10-CM

## 2017-05-04 MED ORDER — VITAMIN D (ERGOCALCIFEROL) 1.25 MG (50000 UNIT) PO CAPS: 50000.0000 [IU] | ORAL_CAPSULE | ORAL | 0 refills | Status: DC

## 2017-05-04 MED ORDER — LISINOPRIL 5 MG PO TABS: 5.0000 mg | ORAL_TABLET | Freq: Every day | ORAL | 0 refills | Status: DC

## 2017-05-05 NOTE — Progress Notes (Signed)
 Office: [phone]  /  Fax: [phone]   HPI:   Chief Complaint: OBESITY Steven Hodge is here to discuss his progress with his obesity treatment plan. He is on the keep a food journal with 1400-1550 calories and 90 grams of protein daily and is following his eating plan approximately 100 % of the time. He states he is walking 30 minutes 1 times per week. [name] continues to be mindful of his eating. He states he does better following a structured meal plan rather than keep a food journal. His weight is 261 lb (118.4 kg) today and has gained weight since his last visit. He has lost 11 lbs since starting treatment with us.  Hypertension [name] is a 56 y.o. male with hypertension. Darren's blood pressure is stable and he denies chest pain or shortness of breath. He is working weight loss to help control his blood pressure with the goal of decreasing his risk of heart attack and stroke. [name]'s blood pressure is not currently controlled.  Vitamin D deficiency [name] has a diagnosis of vitamin D deficiency. He is currently taking prescription Vit D and denies nausea, vomiting or muscle weakness.  ALLERGIES: No Known Allergies  MEDICATIONS: Current Outpatient Medications on File Prior to Visit  Medication Sig Dispense Refill  . aspirin 325 MG tablet Take 325 mg by mouth daily.    . blood glucose meter kit and supplies Dispense based on patient and insurance preference. Use up to four times daily as directed. (FOR ICD-9 250.00, 250.01). 1 each 0  . fish oil-omega-3 fatty acids 1000 MG capsule Take 2 g by mouth daily.    . glucosamine-chondroitin 500-400 MG tablet Take 1 tablet by mouth once.    . glucose blood (BAYER CONTOUR NEXT TEST) test strip USE TO CHECK BLOOD SUGAR UP TO 4 TIMES ADAY 100 each 1  . liraglutide 18 MG/3ML SOPN Inject 0.1 mLs (0.6 mg total) into the skin every morning. 1 pen 0  . metFORMIN (GLUCOPHAGE) 500 MG tablet Take 1 tablet (500 mg total) 3 (three) times daily by  mouth. 90 tablet 0  . Multiple Vitamin (MULTIVITAMIN) tablet Take 1 tablet by mouth daily.    . SURE COMFORT PEN NEEDLES 32G X 4 MM MISC USE FOR VICTOZA INJECTIONS 100 each 0  . TURMERIC PO Take 1,000 mg by mouth 2 (two) times daily.     No current facility-administered medications on file prior to visit.     PAST MEDICAL HISTORY: Past Medical History:  Diagnosis Date  . Back pain   . Cancer (HCC) 06/09/2006   Basal cell carcinoma scalp;   . Diabetes mellitus without complication (HCC)   . Hx of blood clots   . Hypertension     PAST SURGICAL HISTORY: Past Surgical History:  Procedure Laterality Date  . basel cell cancer removed  2009  . KNEE SURGERY  2005   ACL repair R    SOCIAL HISTORY: Social History   Tobacco Use  . Smoking status: Former Smoker  . Smokeless tobacco: Never Used  Substance Use Topics  . Alcohol use: No  . Drug use: No    FAMILY HISTORY: Family History  Problem Relation Age of Onset  . Diabetes Mother   . Liver disease Mother   . Obesity Mother   . Cancer Father 70       lung cancer  . Diabetes Father   . Cancer Sister 65       lung cancer  . Heart   disease Brother 66       cardiac stenting/CAD  . Cancer Brother        skin cancer    ROS: Review of Systems  Constitutional: Negative for weight loss.  Respiratory: Negative for shortness of breath.   Cardiovascular: Negative for chest pain.  Gastrointestinal: Negative for nausea and vomiting.  Musculoskeletal:       Negative muscle weakness    PHYSICAL EXAM: Blood pressure 117/72, pulse 76, temperature 98.1 F (36.7 C), temperature source Oral, height 5' 8" (1.727 m), weight 261 lb (118.4 kg), SpO2 98 %. Body mass index is 39.68 kg/m. Physical Exam  Constitutional: He is oriented to person, place, and time. He appears well-developed and well-nourished.  Cardiovascular: Normal rate.  Pulmonary/Chest: Effort normal.  Musculoskeletal: Normal range of motion.  Neurological: He is  oriented to person, place, and time.  Skin: Skin is warm and dry.  Psychiatric: He has a normal mood and affect. His behavior is normal.  Vitals reviewed.   RECENT LABS AND TESTS: BMET    Component Value Date/Time   NA 141 02/12/2017 0809   K 4.4 02/12/2017 0809   CL 103 02/12/2017 0809   CO2 23 02/12/2017 0809   GLUCOSE 126 (H) 02/12/2017 0809   GLUCOSE 146 (H) 04/22/2016 1349   BUN 21 02/12/2017 0809   CREATININE 0.83 02/12/2017 0809   CREATININE 0.88 04/22/2016 1349   CALCIUM 9.3 02/12/2017 0809   GFRNONAA 100 02/12/2017 0809   GFRAA 116 02/12/2017 0809   Lab Results  Component Value Date   HGBA1C 6.0 (H) 02/12/2017   HGBA1C 6.7 (H) 09/18/2016   HGBA1C 6.7 (H) 08/20/2016   HGBA1C 6.1 04/22/2016   HGBA1C 6.3 10/02/2015   Lab Results  Component Value Date   INSULIN 30.2 (H) 02/12/2017   INSULIN 32.4 (H) 09/18/2016   CBC    Component Value Date/Time   WBC 7.5 02/12/2017 0809   WBC 9.8 11/25/2016 1119   WBC 8.2 04/22/2016 1349   RBC 5.06 02/12/2017 0809   RBC 4.57 (A) 11/25/2016 1119   RBC 5.24 04/22/2016 1349   HGB 15.5 02/12/2017 0809   HCT 44.5 02/12/2017 0809   PLT 193 02/12/2017 0809   MCV 88 02/12/2017 0809   MCH 30.6 02/12/2017 0809   MCH 31.5 (A) 11/25/2016 1119   MCH 30.9 04/22/2016 1349   MCHC 34.8 02/12/2017 0809   MCHC 36.1 (A) 11/25/2016 1119   MCHC 34.4 04/22/2016 1349   RDW 13.4 02/12/2017 0809   LYMPHSABS 2.3 02/12/2017 0809   MONOABS 656 04/22/2016 1349   EOSABS 0.2 02/12/2017 0809   BASOSABS 0.0 02/12/2017 0809   Iron/TIBC/Ferritin/ %Sat No results found for: IRON, TIBC, FERRITIN, IRONPCTSAT Lipid Panel     Component Value Date/Time   CHOL 180 02/12/2017 0809   TRIG 153 (H) 02/12/2017 0809   HDL 45 02/12/2017 0809   CHOLHDL 4.4 04/22/2016 1349   VLDL 30 04/22/2016 1349   LDLCALC 104 (H) 02/12/2017 0809   Hepatic Function Panel     Component Value Date/Time   PROT 6.9 02/12/2017 0809   ALBUMIN 4.2 02/12/2017 0809   AST 17  02/12/2017 0809   ALT 27 02/12/2017 0809   ALKPHOS 60 02/12/2017 0809   BILITOT 0.3 02/12/2017 0809      Component Value Date/Time   TSH 2.230 09/18/2016 0956   TSH 2.44 04/22/2016 1349   TSH 1.731 03/09/2015 1524    ASSESSMENT AND PLAN: Essential hypertension - Plan: lisinopril (PRINIVIL,ZESTRIL) 5 MG   tablet  Vitamin D deficiency - Plan: Vitamin D, Ergocalciferol, (DRISDOL) 50000 units CAPS capsule  Class 2 severe obesity with serious comorbidity and body mass index (BMI) of 39.0 to 39.9 in adult, unspecified obesity type ([place])  PLAN:  Hypertension We discussed sodium restriction, working on healthy weight loss, and a regular exercise program as the means to achieve improved blood pressure control. [name] agreed with this plan and agreed to follow up as directed. We will continue to monitor his blood pressure as well as his progress with the above lifestyle modifications. Joesiah agrees to continue taking lisinopril 5 mg qd #30 and we will refill for 1 month. Roderick agrees follow up with our clinic in 6 weeks.  Vitamin D Deficiency Apollos was informed that low vitamin D levels contributes to fatigue and are associated with obesity, breast, and colon cancer. Cardin agrees to continue taking prescription Vit D _0 ,000 IU every week #4 and we will refill for 1 month. He will follow up for routine testing of vitamin D, at least 2-3 times per year. He was informed of the risk of over-replacement of vitamin D and agrees to not increase his dose unless he discusses this with [name] first. Kelsie agrees to follow up with our clinic in 6 weeks.  Obesity Jakarie is currently in the action stage of change. As such, his goal is to continue with weight loss efforts He has agreed to change to follow the Category 3 plan Juwann has been instructed to work up to a goal of 150 minutes of combined cardio and strengthening exercise per week for weight loss and overall health benefits. We discussed the following  Behavioral Modification Strategies today: increasing lean protein intake and work on meal planning and easy cooking plans   [name] has agreed to follow up with our clinic in 6 weeks. He was informed of the importance of frequent follow up visits to maximize his success with intensive lifestyle modifications for his multiple health conditions.  I, Trixie Dredge, am acting as transcriptionist for Lacy Duverney, PA-C  I have reviewed the above documentation for accuracy and completeness, and I agree with the above. -Lacy Duverney, PA-C  I have reviewed the above note and agree with the plan. -Dennard Nip, MD     Today's visit was # 13 out of 22.  Starting weight: 272 lbs Starting date: 09/18/16 Today's weight : 261 lbs  Today's date: 05/04/2017 Total lbs lost to date: 11 (Patients must lose 7 lbs in the first 6 months to continue with counseling)   ASK: We discussed the diagnosis of obesity with [name] today and [name] agreed to give [name] permission to discuss obesity behavioral modification therapy today.  ASSESS: Trustin has the diagnosis of obesity and his BMI today is 39.69 Dung is in the action stage of change   ADVISE: [place] was educated on the multiple health risks of obesity as well as the benefit of weight loss to improve his health. He was advised of the need for long term treatment and the importance of lifestyle modifications.  AGREE: Multiple dietary modification options and treatment options were discussed and  Demaryius agreed to follow the Category 3 plan We discussed the following Behavioral Modification Strategies today: increasing lean protein intake and work on meal planning and easy cooking plans

## 2017-05-16 ENCOUNTER — Ambulatory Visit: Payer: BLUE CROSS/BLUE SHIELD | Admitting: Physician Assistant

## 2017-05-16 ENCOUNTER — Encounter: Payer: Self-pay | Admitting: Physician Assistant

## 2017-05-16 ENCOUNTER — Other Ambulatory Visit: Payer: Self-pay

## 2017-05-16 VITALS — BP 124/86 | HR 73 | Temp 98.4°F | Resp 16 | Ht 68.0 in | Wt 261.2 lb

## 2017-05-16 DIAGNOSIS — L03116 Cellulitis of left lower limb: Secondary | ICD-10-CM

## 2017-05-16 DIAGNOSIS — R7303 Prediabetes: Secondary | ICD-10-CM | POA: Diagnosis not present

## 2017-05-16 MED ORDER — INSULIN PEN NEEDLE 32G X 4 MM MISC: 0 refills | Status: DC

## 2017-05-16 MED ORDER — DOXYCYCLINE HYCLATE 100 MG PO TABS: 100.0000 mg | ORAL_TABLET | Freq: Two times a day (BID) | ORAL | 0 refills | Status: DC

## 2017-05-16 NOTE — Progress Notes (Signed)
Steven Hodge  MRN: [id number] DOB: [birth year]  PCP: Wardell Honour, MD  Subjective:  Pt is a 56 year old male PMH HTN, DM, obesity who presents to clinic for knee problem. He fell in hay x 2 days ago and soon after noticed a red knot on the front of his left knee. No drainage. Denies fever, chills, knee swelling, knee pain, reduced ROM.  He has had a similar knot on his elbow before. Was told this was gout.   Review of Systems  Constitutional: Negative for chills, diaphoresis, fatigue and fever.  Skin: Positive for wound.  Neurological: Negative for weakness.    Patient Active Problem List   Diagnosis Date Noted  . Vitamin D deficiency 11/18/2016  . Class 2 obesity without serious comorbidity with body mass index (BMI) of 39.0 to 39.9 in adult 11/04/2016  . Type 2 diabetes mellitus without complication, without long-term current use of insulin ([place]) 09/18/2016  . Essential hypertension 09/18/2016  . Morbid obesity ([place]) 08/20/2016  . History of colonic polyps 08/20/2016    Current Outpatient Medications on File Prior to Visit  Medication Sig Dispense Refill  . aspirin 325 MG tablet Take 325 mg by mouth daily.    . blood glucose meter kit and supplies Dispense based on patient and insurance preference. Use up to four times daily as directed. (FOR ICD-9 250.00, 250.01). 1 each 0  . fish oil-omega-3 fatty acids 1000 MG capsule Take 2 g by mouth daily.    [name] glucosamine-chondroitin 500-400 MG tablet Take 1 tablet by mouth once.    [name] glucose blood (BAYER CONTOUR NEXT TEST) test strip USE TO CHECK BLOOD SUGAR UP TO 4 TIMES ADAY 100 each 1  . liraglutide 18 MG/3ML SOPN Inject 0.1 mLs (0.6 mg total) into the skin every morning. 1 pen 0  . lisinopril (PRINIVIL,ZESTRIL) 5 MG tablet Take 1 tablet (5 mg total) by mouth daily. 30 tablet 0  . metFORMIN (GLUCOPHAGE) 500 MG tablet Take 1 tablet (500 mg total) 3 (three) times daily by mouth. 90 tablet 0  . Multiple Vitamin (MULTIVITAMIN)  tablet Take 1 tablet by mouth daily.    . SURE COMFORT PEN NEEDLES 32G X 4 MM MISC USE FOR VICTOZA INJECTIONS 100 each 0  . TURMERIC PO Take 1,000 mg by mouth 2 (two) times daily.    . Vitamin D, Ergocalciferol, (DRISDOL) 50000 units CAPS capsule Take 1 capsule (50,000 Units total) by mouth every 7 (seven) days. 4 capsule 0   No current facility-administered medications on file prior to visit.     No Known Allergies   Objective:  BP 124/86   Pulse 73   Temp 98.4 F (36.9 C) (Oral)   Resp 16   Ht _0  (1.727 m)   Wt 261 lb 3.2 oz (118.5 kg)   SpO2 97%   BMI 39.72 kg/m   Physical Exam  Constitutional: He is oriented to person, place, and time and well-developed, well-nourished, and in no distress. No distress.  Neurological: He is alert and oriented to person, place, and time. GCS score is 15.  Skin: Skin is warm and dry.     Psychiatric: Mood, memory, affect and judgment normal.  Vitals reviewed.  Procedure: Verbal consent obtained. Skin was cleaned with alcohol and betadine and anesthetized with 2cc lidocaine. A 1 cm incision was made. A moderate amount of bloody and white crystal-like material expressed. Wound lightly packed with 1/4 inch packing. Wound dressed and wound care discussed.  Assessment and Plan :  1. Cellulitis of left lower extremity - WOUND CULTURE - doxycycline (VIBRA-TABS) 100 MG tablet; Take 1 tablet (100 mg total) by mouth 2 (two) times daily.  Dispense: 20 tablet; Refill: 0 - Uric Acid - I&D performed without incident. Due to appearance of drainage, cannot r/o gout. Wound lightly packed with 1/4in packing. Wound care discussed. RTC in 48 hours for recheck.   Mercer Pod, PA-C  [facility] 05/16/2017 10:11 AM

## 2017-05-16 NOTE — Patient Instructions (Addendum)
  Change dressing if dressing is soiled. Keep covered at all times except when showering. You may get wet in the shower but do not scrub.  Apply warm compresses/heating pad to facilitate drainage. Leave packing in place. Do not apply any ointments as this may delay healing. If an antibiotic was prescribed, take as directed until finished. Return in 48 hours for wound care.   Thank you for coming in today. I hope you feel we met your needs.  Feel free to call PCP if you have any questions or further requests.  Please consider signing up for MyChart if you do not already have it, as this is a great way to communicate with me.  Best,  Whitney McVey, PA-C   IF you received an x-ray today, you will receive an invoice from [facility]. Please contact [facility] at [phone] with questions or concerns regarding your invoice.   IF you received labwork today, you will receive an invoice from [facility]. Please contact [facility] at [phone] with questions or concerns regarding your invoice.   Our billing staff will not be able to assist you with questions regarding bills from these companies.  You will be contacted with the lab results as soon as they are available. The fastest way to get your results is to activate your My Chart account. Instructions are located on the last page of this paperwork. If you have not heard from [name] regarding the results in 2 weeks, please contact this office.

## 2017-05-17 LAB — URIC ACID: Uric Acid: 7.5 mg/dL (ref 3.7–8.6)

## 2017-05-18 ENCOUNTER — Ambulatory Visit: Payer: Self-pay | Admitting: Family Medicine

## 2017-05-19 ENCOUNTER — Ambulatory Visit: Payer: BLUE CROSS/BLUE SHIELD | Admitting: Physician Assistant

## 2017-05-20 ENCOUNTER — Ambulatory Visit: Payer: BLUE CROSS/BLUE SHIELD | Admitting: Physician Assistant

## 2017-05-20 ENCOUNTER — Encounter: Payer: Self-pay | Admitting: Physician Assistant

## 2017-05-20 ENCOUNTER — Other Ambulatory Visit: Payer: Self-pay

## 2017-05-20 VITALS — BP 122/84 | HR 82 | Temp 98.0°F | Resp 16 | Ht 68.0 in | Wt 265.6 lb

## 2017-05-20 DIAGNOSIS — L0291 Cutaneous abscess, unspecified: Secondary | ICD-10-CM

## 2017-05-20 DIAGNOSIS — Z5189 Encounter for other specified aftercare: Secondary | ICD-10-CM

## 2017-05-20 LAB — WOUND CULTURE: Organism ID, Bacteria: NONE SEEN

## 2017-05-20 NOTE — Progress Notes (Signed)
Steven Hodge  MRN: [id number] DOB: 03-27-1961  PCP: Wardell Honour, MD  Subjective:  Pt is a pleasant 56 year old male who presents to clinic for f/u abscess s/p I&D.  He was here 12/8 for an abscess on his knee and received I&D. He endorses pain the first two days after, "milky drainage" on one day, the next day there was "cottage cheese" drainage, which is similar to drainage from his initial I&D. Overall he is feeling much better. Denies fever, chills, knee pain, knee redness or swelling.  Review of Systems  Constitutional: Negative for chills, diaphoresis, fatigue and fever.  Musculoskeletal: Negative for arthralgias, gait problem, joint swelling and myalgias.  Skin: Positive for wound.  Neurological: Negative for weakness.    Patient Active Problem List   Diagnosis Date Noted  . Vitamin D deficiency 11/18/2016  . Class 2 obesity without serious comorbidity with body mass index (BMI) of 39.0 to 39.9 in adult 11/04/2016  . Type 2 diabetes mellitus without complication, without long-term current use of insulin ([place]) 09/18/2016  . Essential hypertension 09/18/2016  . Morbid obesity ([place]) 08/20/2016  . History of colonic polyps 08/20/2016    Current Outpatient Medications on File Prior to Visit  Medication Sig Dispense Refill  . aspirin 325 MG tablet Take 325 mg by mouth daily.    . blood glucose meter kit and supplies Dispense based on patient and insurance preference. Use up to four times daily as directed. (FOR ICD-9 250.00, 250.01). 1 each 0  . doxycycline (VIBRA-TABS) 100 MG tablet Take 1 tablet (100 mg total) by mouth 2 (two) times daily. 20 tablet 0  . fish oil-omega-3 fatty acids 1000 MG capsule Take 2 g by mouth daily.    [name] glucosamine-chondroitin 500-400 MG tablet Take 1 tablet by mouth once.    [name] glucose blood (BAYER CONTOUR NEXT TEST) test strip USE TO CHECK BLOOD SUGAR UP TO 4 TIMES ADAY 100 each 1  . Insulin Pen Needle (SURE COMFORT PEN NEEDLES) 32G X 4 MM MISC  USE FOR VICTOZA INJECTIONS 100 each 0  . liraglutide 18 MG/3ML SOPN Inject 0.1 mLs (0.6 mg total) into the skin every morning. 1 pen 0  . lisinopril (PRINIVIL,ZESTRIL) 5 MG tablet Take 1 tablet (5 mg total) by mouth daily. 30 tablet 0  . metFORMIN (GLUCOPHAGE) 500 MG tablet Take 1 tablet (500 mg total) 3 (three) times daily by mouth. 90 tablet 0  . Multiple Vitamin (MULTIVITAMIN) tablet Take 1 tablet by mouth daily.    . TURMERIC PO Take 1,000 mg by mouth 2 (two) times daily.    . Vitamin D, Ergocalciferol, (DRISDOL) 50000 units CAPS capsule Take 1 capsule (50,000 Units total) by mouth every 7 (seven) days. 4 capsule 0   No current facility-administered medications on file prior to visit.     No Known Allergies   Objective:  BP 122/84   Pulse 82   Temp 98 F (36.7 C) (Oral)   Resp 16   Ht _0  (1.727 m)   Wt 265 lb 9.6 oz (120.5 kg)   SpO2 97%   BMI 40.38 kg/m   Physical Exam  Constitutional: He is oriented to person, place, and time and well-developed, well-nourished, and in no distress. No distress.  Neurological: He is alert and oriented to person, place, and time. GCS score is 15.  Skin: Skin is warm and dry.     Psychiatric: Mood, memory, affect and judgment normal.  Vitals reviewed.  Assessment and Plan :  1. Encounter for wound care 2. Abscess - Pt presents for wound care s/p I&D. Drainage suspicious for gout. Uric acid from last OV is negative. Wound is healing well. Will not repack. Advise pt to apply heat today and tomorrow. Wound care discussed. He understands and agrees. RTC PRN.    Mercer Pod, PA-C  [facility] 05/20/2017 11:57 AM

## 2017-05-20 NOTE — Patient Instructions (Signed)
     IF you received an x-ray today, you will receive an invoice from [facility]. Please contact [facility] at [phone] with questions or concerns regarding your invoice.   IF you received labwork today, you will receive an invoice from [facility]. Please contact [facility] at [phone] with questions or concerns regarding your invoice.   Our billing staff will not be able to assist you with questions regarding bills from these companies.  You will be contacted with the lab results as soon as they are available. The fastest way to get your results is to activate your My Chart account. Instructions are located on the last page of this paperwork. If you have not heard from us regarding the results in 2 weeks, please contact this office.     

## 2017-06-03 ENCOUNTER — Ambulatory Visit: Payer: BLUE CROSS/BLUE SHIELD | Admitting: Family Medicine

## 2017-06-03 ENCOUNTER — Encounter: Payer: Self-pay | Admitting: Family Medicine

## 2017-06-03 ENCOUNTER — Other Ambulatory Visit: Payer: Self-pay

## 2017-06-03 VITALS — BP 128/72 | HR 89 | Temp 98.2°F | Resp 16 | Ht 68.5 in | Wt 262.0 lb

## 2017-06-03 DIAGNOSIS — Z0000 Encounter for general adult medical examination without abnormal findings: Secondary | ICD-10-CM

## 2017-06-03 DIAGNOSIS — E559 Vitamin D deficiency, unspecified: Secondary | ICD-10-CM

## 2017-06-03 DIAGNOSIS — E6609 Other obesity due to excess calories: Secondary | ICD-10-CM

## 2017-06-03 DIAGNOSIS — Z86010 Personal history of colonic polyps: Secondary | ICD-10-CM

## 2017-06-03 DIAGNOSIS — Z125 Encounter for screening for malignant neoplasm of prostate: Secondary | ICD-10-CM

## 2017-06-03 DIAGNOSIS — Z23 Encounter for immunization: Secondary | ICD-10-CM | POA: Diagnosis not present

## 2017-06-03 DIAGNOSIS — E119 Type 2 diabetes mellitus without complications: Secondary | ICD-10-CM | POA: Diagnosis not present

## 2017-06-03 DIAGNOSIS — Z6839 Body mass index (BMI) 39.0-39.9, adult: Secondary | ICD-10-CM | POA: Diagnosis not present

## 2017-06-03 DIAGNOSIS — I10 Essential (primary) hypertension: Secondary | ICD-10-CM

## 2017-06-03 LAB — POCT URINALYSIS DIP (MANUAL ENTRY)
Bilirubin, UA: NEGATIVE
Blood, UA: NEGATIVE
Glucose, UA: NEGATIVE mg/dL
Ketones, POC UA: NEGATIVE mg/dL
Leukocytes, UA: NEGATIVE
Nitrite, UA: NEGATIVE
Protein Ur, POC: NEGATIVE mg/dL
Spec Grav, UA: 1.025 (ref 1.010–1.025)
Urobilinogen, UA: 0.2 E.U./dL
pH, UA: 5.5 (ref 5.0–8.0)

## 2017-06-03 NOTE — Progress Notes (Signed)
Subjective:    Patient ID: Steven Hodge, male    DOB: [birth year], 56 y.o.   MRN: [id number]  06/03/2017  Annual Exam    HPI This 56 y.o. male presents for Complete Physical Examination.  Last physical:  04-22-2016 Colonoscopy:  Collene Mares PSA: Eye exam: glasses; 2018 Dental exam:  Eating 1500-2000 kcal per day.  Previously eat 3 ounces of steak at night.  Now wants eating 10 ounces of steak.  Eating protein throughout the day.  Sugars fasting 160s-180s.  Daytime sugars 110-130.  Eating a large snack at bedtime.  Placed on high protein low carb diet for two weeks.  Lost 8 pounds in two weeks.  Needs to see Leafy Ro on 06/15/17.  Weighed 278 pounds when started with Leafy Ro.  When below 250, at personal best.  Was unobtainable before.  Went to see a nutritionist who took patient from 18 to 96.  Starving self on prior diet.   Dr. Laney Pastor tried to start a high protein diet before going to nutritionist.  Pork skins, chicken, steaks, eggs, beef jerky, pork chops.  URI: onset two days ago.  No fever; no chills/sweats.  No body aches.  Mild headache; scratchy throat; no pain with swallowing.  No ear pain.  Mild rhinorrhea; +nasal congestion.  No cough.  No n/v/d.  Taking Aleve.  Using [employer] nasal spray twelve hour.      Visual Acuity Screening   Right eye Left eye Both eyes  Without correction:     With correction: '20/20 20/20 20/20 '    BP Readings from Last 3 Encounters:  06/03/17 128/72  05/20/17 122/84  05/16/17 124/86   Wt Readings from Last 3 Encounters:  06/03/17 262 lb (118.8 kg)  05/20/17 265 lb 9.6 oz (120.5 kg)  05/16/17 261 lb 3.2 oz (118.5 kg)   Immunization History  Administered Date(s) Administered  . Hepatitis A, Adult 12/02/2016, 06/03/2017  . Hepatitis B, adult 04/22/2016, 08/20/2016, 12/02/2016  . Influenza,inj,Quad PF,6+ Mos 03/09/2015, 04/22/2016, 06/03/2017  . Pneumococcal Polysaccharide-23 03/09/2015  . Tdap 06/09/2008    Review of Systems    Constitutional: Negative for activity change, appetite change, chills, diaphoresis, fatigue, fever and unexpected weight change.  HENT: Positive for congestion and rhinorrhea. Negative for dental problem, drooling, ear discharge, ear pain, facial swelling, hearing loss, mouth sores, nosebleeds, postnasal drip, sinus pressure, sinus pain, sneezing, sore throat, tinnitus, trouble swallowing and voice change.   Eyes: Negative for photophobia, pain, discharge, redness, itching and visual disturbance.  Respiratory: Negative for apnea, cough, choking, chest tightness, shortness of breath, wheezing and stridor.   Cardiovascular: Positive for leg swelling. Negative for chest pain and palpitations.  Gastrointestinal: Negative for abdominal pain, blood in stool, constipation, diarrhea, nausea and vomiting.  Endocrine: Negative for cold intolerance, heat intolerance, polydipsia, polyphagia and polyuria.  Genitourinary: Negative for decreased urine volume, difficulty urinating, discharge, dysuria, enuresis, flank pain, frequency, genital sores, hematuria, penile pain, penile swelling, scrotal swelling, testicular pain and urgency.       Nocturia x 1.  Urinary stream is strong.  Sex drive is good.  No major issues with erections.  Musculoskeletal: Positive for arthralgias and joint swelling. Negative for back pain, gait problem, myalgias, neck pain and neck stiffness.  Skin: Negative for color change, pallor, rash and wound.  Allergic/Immunologic: Positive for environmental allergies. Negative for food allergies and immunocompromised state.  Neurological: Positive for headaches. Negative for dizziness, tremors, seizures, syncope, facial asymmetry, speech difficulty, weakness, light-headedness and numbness.  Hematological: Negative for  adenopathy. Does not bruise/bleed easily.  Psychiatric/Behavioral: Negative for agitation, behavioral problems, confusion, decreased concentration, dysphoric mood, hallucinations,  self-injury, sleep disturbance and suicidal ideas. The patient is not nervous/anxious and is not hyperactive.     Past Medical History:  Diagnosis Date  . Back pain   . Cancer ([place]) 06/09/2006   Basal cell carcinoma scalp;   [name] Diabetes mellitus without complication ([place])   . Hx of blood clots   . Hypertension    Past Surgical History:  Procedure Laterality Date  . basel cell cancer removed  2009  . KNEE SURGERY  2005   ACL repair R   No Known Allergies Current Outpatient Medications on File Prior to Visit  Medication Sig Dispense Refill  . aspirin 325 MG tablet Take 325 mg by mouth daily.    . blood glucose meter kit and supplies Dispense based on patient and insurance preference. Use up to four times daily as directed. (FOR ICD-9 250.00, 250.01). 1 each 0  . fish oil-omega-3 fatty acids 1000 MG capsule Take 2 g by mouth daily.    [name] glucosamine-chondroitin 500-400 MG tablet Take 1 tablet by mouth once.    [name] glucose blood (BAYER CONTOUR NEXT TEST) test strip USE TO CHECK BLOOD SUGAR UP TO 4 TIMES ADAY 100 each 1  . Insulin Pen Needle (SURE COMFORT PEN NEEDLES) 32G X 4 MM MISC USE FOR VICTOZA INJECTIONS 100 each 0  . liraglutide 18 MG/3ML SOPN Inject 0.1 mLs (0.6 mg total) into the skin every morning. 1 pen 0  . lisinopril (PRINIVIL,ZESTRIL) 5 MG tablet Take 1 tablet (5 mg total) by mouth daily. 30 tablet 0  . metFORMIN (GLUCOPHAGE) 500 MG tablet Take 1 tablet (500 mg total) 3 (three) times daily by mouth. 90 tablet 0  . Multiple Vitamin (MULTIVITAMIN) tablet Take 1 tablet by mouth daily.    . TURMERIC PO Take 1,000 mg by mouth 2 (two) times daily.    . Vitamin D, Ergocalciferol, (DRISDOL) 50000 units CAPS capsule Take 1 capsule (50,000 Units total) by mouth every 7 (seven) days. 4 capsule 0   No current facility-administered medications on file prior to visit.    Social History   Socioeconomic History  . Marital status: Married    Spouse name: Not on file  . Number of children:  Not on file  . Years of education: Not on file  . Highest education level: Not on file  Social Needs  . Financial resource strain: Not on file  . Food insecurity - worry: Not on file  . Food insecurity - inability: Not on file  . Transportation needs - medical: Not on file  . Transportation needs - non-medical: Not on file  Occupational History  . Occupation: Medical illustrator, Dealer  Tobacco Use  . Smoking status: Former Research scientist (life sciences)  . Smokeless tobacco: Never Used  Substance and Sexual Activity  . Alcohol use: No  . Drug use: No  . Sexual activity: Yes  Other Topics Concern  . 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  Walking once per week in 2018.         Family History  Problem Relation Age of Onset  . Diabetes Mother   . Liver disease Mother   . Obesity Mother   . Cancer Father 46       lung cancer  . Diabetes Father   . Cancer Sister 43       lung cancer  . Heart disease Brother 49       cardiac stenting/CAD  . Cancer Brother        skin cancer  . Arthritis Brother        Objective:    BP 128/72   Pulse 89   Temp 98.2 F (36.8 C) (Oral)   Resp 16   Ht 5' 8.5" (1.74 m)   Wt 262 lb (118.8 kg)   SpO2 96%   BMI 39.25 kg/m  Physical Exam  Constitutional: He is oriented to person, place, and time. He appears well-developed and well-nourished. No distress.  HENT:  Head: Normocephalic and atraumatic.  Right Ear: External ear normal.  Left Ear: External ear normal.  Nose: Nose normal.  Mouth/Throat: Oropharynx is clear and moist.  Eyes: Conjunctivae and EOM are normal. Pupils are equal, round, and reactive to light.  Neck: Normal range of motion. Neck supple. Carotid bruit is not present. No  thyromegaly present.  Cardiovascular: Normal rate, regular rhythm, normal heart sounds and intact distal pulses. Exam reveals no gallop and no friction rub.  No murmur heard. Pulmonary/Chest: Effort normal and breath sounds normal. He has no wheezes. He has no rales.  Abdominal: Soft. Bowel sounds are normal. He exhibits no distension and no mass. There is no tenderness. There is no rebound and no guarding. Hernia confirmed negative in the right inguinal area and confirmed negative in the left inguinal area.  Genitourinary: Testes normal and penis normal. Circumcised.  Musculoskeletal:       Right shoulder: Normal.       Left shoulder: Normal.       Cervical back: Normal.       Legs: Lymphadenopathy:    He has no cervical adenopathy.       Right: No inguinal adenopathy present.       Left: No inguinal adenopathy present.  Neurological: He is alert and oriented to person, place, and time. He has normal reflexes. No cranial nerve deficit. He exhibits normal muscle tone. Coordination normal.  Skin: Skin is warm and dry. No rash noted. He is not diaphoretic.  Well healing carbuncle lateral L knee without erythema, fluctuance, or tenderness.    Psychiatric: He has a normal mood and affect. His behavior is normal. Judgment and thought content normal.   No results found. Depression screen [facility] 2/9 06/03/2017 05/20/2017 05/16/2017 12/02/2016 11/25/2016  Decreased Interest 0 0 0 0 0  Down, Depressed, Hopeless 0 0 0 0 0  PHQ - 2 Score 0 0 0 0 0  Altered sleeping - - - - -  Tired, decreased energy - - - - -  Change in appetite - - - - -  Feeling bad or failure about yourself  - - - - -  Trouble concentrating - - - - -  Moving slowly or fidgety/restless - - - - -  Suicidal thoughts - - - - -  PHQ-9 Score - - - - -   Fall Risk  06/03/2017 05/20/2017 05/16/2017 12/02/2016 11/25/2016  Falls in the past year? No No Yes No No  Comment - - x 2 days ago - -        Assessment & Plan:   1. Routine  physical examination   2. Type 2 diabetes mellitus without complication, without long-term current use of insulin ([place])   3. Essential hypertension   4. Vitamin D deficiency   5. History of colonic polyps   6. Class 2 obesity due to excess calories without serious comorbidity with body mass index (BMI) of 39.0 to 39.9 in adult   7. Screening for prostate cancer   8. Need for prophylactic vaccination and inoculation against influenza     -anticipatory guidance provided --- exercise, weight loss, safe driving practices, aspirin 17m daily. -obtain age appropriate screening labs and labs for chronic disease management. -congratulations on weight loss! -s/p Hepatitis A#2 and influenza vaccines. -continue monthly follow-up with BLeafy Ro  -acute onset URI; continue with OTC nasal spray and Aleve as needed.     Orders Placed This Encounter  Procedures  . Flu Vaccine QUAD 36+ mos IM  . Hepatitis A vaccine adult IM  . CBC with Differential/Platelet  . Comprehensive metabolic panel    Order Specific Question:   Has the patient fasted?    Answer:   No  . Hemoglobin A1c  . Lipid panel    Order Specific Question:   Has the patient fasted?    Answer:   No  . PSA  . Microalbumin / creatinine urine ratio  . VITAMIN D 25 Hydroxy (Vit-D Deficiency, Fractures)  . POCT urinalysis dipstick   No orders of the defined types were placed in this encounter.   Return in about 1 year (around 06/03/2018) for complete physical examiniation.   Kristi MElayne Guerin M.D. [facility] [address]  [phone] [phone]

## 2017-06-03 NOTE — Patient Instructions (Addendum)
   IF you received an x-ray today, you will receive an invoice from [facility]. Please contact [facility] at [phone] with questions or concerns regarding your invoice.   IF you received labwork today, you will receive an invoice from [facility]. Please contact [facility] at [phone] with questions or concerns regarding your invoice.   Our billing staff will not be able to assist you with questions regarding bills from these companies.  You will be contacted with the lab results as soon as they are available. The fastest way to get your results is to activate your My Chart account. Instructions are located on the last page of this paperwork. If you have not heard from us regarding the results in 2 weeks, please contact this office.      Preventive Care 40-64 Years, Male Preventive care refers to lifestyle choices and visits with your health care provider that can promote health and wellness. What does preventive care include?  A yearly physical exam. This is also called an annual well check.  Dental exams once or twice a year.  Routine eye exams. Ask your health care provider how often you should have your eyes checked.  Personal lifestyle choices, including: ? Daily care of your teeth and gums. ? Regular physical activity. ? Eating a healthy diet. ? Avoiding tobacco and drug use. ? Limiting alcohol use. ? Practicing safe sex. ? Taking low-dose aspirin every day starting at age 50. What happens during an annual well check? The services and screenings done by your health care provider during your annual well check will depend on your age, overall health, lifestyle risk factors, and family history of disease. Counseling Your health care provider may ask you questions about your:  Alcohol use.  Tobacco use.  Drug use.  Emotional well-being.  Home and relationship well-being.  Sexual activity.  Eating habits.  Work and work  environment.  Screening You may have the following tests or measurements:  Height, weight, and BMI.  Blood pressure.  Lipid and cholesterol levels. These may be checked every 5 years, or more frequently if you are over 50 years old.  Skin check.  Lung cancer screening. You may have this screening every year starting at age 55 if you have a 30-pack-year history of smoking and currently smoke or have quit within the past 15 years.  Fecal occult blood test (FOBT) of the stool. You may have this test every year starting at age 50.  Flexible sigmoidoscopy or colonoscopy. You may have a sigmoidoscopy every 5 years or a colonoscopy every 10 years starting at age 50.  Prostate cancer screening. Recommendations will vary depending on your family history and other risks.  Hepatitis C blood test.  Hepatitis B blood test.  Sexually transmitted disease (STD) testing.  Diabetes screening. This is done by checking your blood sugar (glucose) after you have not eaten for a while (fasting). You may have this done every 1-3 years.  Discuss your test results, treatment options, and if necessary, the need for more tests with your health care provider. Vaccines Your health care provider may recommend certain vaccines, such as:  Influenza vaccine. This is recommended every year.  Tetanus, diphtheria, and acellular pertussis (Tdap, Td) vaccine. You may need a Td booster every 10 years.  Varicella vaccine. You may need this if you have not been vaccinated.  Zoster vaccine. You may need this after age 60.  Measles, mumps, and rubella (MMR) vaccine. You may need at least one dose   of MMR if you were born in 1957 or later. You may also need a second dose.  Pneumococcal 13-valent conjugate (PCV13) vaccine. You may need this if you have certain conditions and have not been vaccinated.  Pneumococcal polysaccharide (PPSV23) vaccine. You may need one or two doses if you smoke cigarettes or if you have  certain conditions.  Meningococcal vaccine. You may need this if you have certain conditions.  Hepatitis A vaccine. You may need this if you have certain conditions or if you travel or work in places where you may be exposed to hepatitis A.  Hepatitis B vaccine. You may need this if you have certain conditions or if you travel or work in places where you may be exposed to hepatitis B.  Haemophilus influenzae type b (Hib) vaccine. You may need this if you have certain risk factors.  Talk to your health care provider about which screenings and vaccines you need and how often you need them. This information is not intended to replace advice given to you by your health care provider. Make sure you discuss any questions you have with your health care provider. Document Released: 06/22/2015 Document Revised: 02/13/2016 Document Reviewed: 03/27/2015 Elsevier Interactive Patient Education  [employer].

## 2017-06-04 LAB — COMPREHENSIVE METABOLIC PANEL
ALT: 27 IU/L (ref 0–44)
AST: 17 IU/L (ref 0–40)
Albumin/Globulin Ratio: 1.6 (ref 1.2–2.2)
Albumin: 4.2 g/dL (ref 3.5–5.5)
Alkaline Phosphatase: 65 IU/L (ref 39–117)
BUN/Creatinine Ratio: 18 (ref 9–20)
BUN: 16 mg/dL (ref 6–24)
Bilirubin Total: 0.3 mg/dL (ref 0.0–1.2)
CO2: 24 mmol/L (ref 20–29)
Calcium: 9.2 mg/dL (ref 8.7–10.2)
Chloride: 105 mmol/L (ref 96–106)
Creatinine, Ser: 0.87 mg/dL (ref 0.76–1.27)
GFR calc Af Amer: 113 mL/min/{1.73_m2} (ref >59)
GFR calc non Af Amer: 98 mL/min/{1.73_m2} (ref >59)
Globulin, Total: 2.7 g/dL (ref 1.5–4.5)
Glucose: 149 mg/dL — ABNORMAL HIGH (ref 65–99)
Potassium: 4.7 mmol/L (ref 3.5–5.2)
Sodium: 143 mmol/L (ref 134–144)
Total Protein: 6.9 g/dL (ref 6.0–8.5)

## 2017-06-04 LAB — CBC WITH DIFFERENTIAL/PLATELET
Basophils Absolute: 0 10*3/uL (ref 0.0–0.2)
Basos: 0 %
EOS (ABSOLUTE): 0.2 10*3/uL (ref 0.0–0.4)
Eos: 2 %
Hematocrit: 46.1 % (ref 37.5–51.0)
Hemoglobin: 15.6 g/dL (ref 13.0–17.7)
Immature Grans (Abs): 0 10*3/uL (ref 0.0–0.1)
Immature Granulocytes: 0 %
Lymphocytes Absolute: 1.9 10*3/uL (ref 0.7–3.1)
Lymphs: 22 %
MCH: 30.3 pg (ref 26.6–33.0)
MCHC: 33.8 g/dL (ref 31.5–35.7)
MCV: 90 fL (ref 79–97)
Monocytes Absolute: 0.7 10*3/uL (ref 0.1–0.9)
Monocytes: 9 %
Neutrophils Absolute: 5.6 10*3/uL (ref 1.4–7.0)
Neutrophils: 67 %
Platelets: 210 10*3/uL (ref 150–379)
RBC: 5.15 x10E6/uL (ref 4.14–5.80)
RDW: 13.3 % (ref 12.3–15.4)
WBC: 8.5 10*3/uL (ref 3.4–10.8)

## 2017-06-04 LAB — MICROALBUMIN / CREATININE URINE RATIO
Creatinine, Urine: 91.4 mg/dL
Microalb/Creat Ratio: 6.5 mg/g creat (ref 0.0–30.0)
Microalbumin, Urine: 5.9 ug/mL

## 2017-06-04 LAB — PSA: Prostate Specific Ag, Serum: 0.6 ng/mL (ref 0.0–4.0)

## 2017-06-04 LAB — LIPID PANEL
Chol/HDL Ratio: 4 ratio (ref 0.0–5.0)
Cholesterol, Total: 189 mg/dL (ref 100–199)
HDL: 47 mg/dL (ref >39)
LDL Calculated: 116 mg/dL — ABNORMAL HIGH (ref 0–99)
Triglycerides: 132 mg/dL (ref 0–149)
VLDL Cholesterol Cal: 26 mg/dL (ref 5–40)

## 2017-06-04 LAB — HEMOGLOBIN A1C
Est. average glucose Bld gHb Est-mCnc: 137 mg/dL
Hgb A1c MFr Bld: 6.4 % — ABNORMAL HIGH (ref 4.8–5.6)

## 2017-06-04 LAB — VITAMIN D 25 HYDROXY (VIT D DEFICIENCY, FRACTURES): Vit D, 25-Hydroxy: 37.2 ng/mL (ref 30.0–100.0)

## 2017-06-15 ENCOUNTER — Ambulatory Visit (INDEPENDENT_AMBULATORY_CARE_PROVIDER_SITE_OTHER): Payer: BLUE CROSS/BLUE SHIELD | Admitting: Family Medicine

## 2017-06-15 VITALS — BP 129/80 | HR 83 | Temp 98.0°F | Ht 68.0 in | Wt 258.0 lb

## 2017-06-15 DIAGNOSIS — E559 Vitamin D deficiency, unspecified: Secondary | ICD-10-CM

## 2017-06-15 DIAGNOSIS — Z6839 Body mass index (BMI) 39.0-39.9, adult: Secondary | ICD-10-CM

## 2017-06-15 DIAGNOSIS — I10 Essential (primary) hypertension: Secondary | ICD-10-CM | POA: Diagnosis not present

## 2017-06-15 DIAGNOSIS — E119 Type 2 diabetes mellitus without complications: Secondary | ICD-10-CM | POA: Diagnosis not present

## 2017-06-15 MED ORDER — VITAMIN D (ERGOCALCIFEROL) 1.25 MG (50000 UNIT) PO CAPS: 50000.0000 [IU] | ORAL_CAPSULE | ORAL | 0 refills | Status: DC

## 2017-06-15 MED ORDER — LIRAGLUTIDE 18 MG/3ML ~~LOC~~ SOPN: 0.6000 mg | PEN_INJECTOR | Freq: Every morning | SUBCUTANEOUS | 0 refills | Status: DC

## 2017-06-15 MED ORDER — GLUCOSE BLOOD VI STRP: ORAL_STRIP | 0 refills | Status: DC

## 2017-06-15 MED ORDER — LISINOPRIL 5 MG PO TABS: 5.0000 mg | ORAL_TABLET | Freq: Every day | ORAL | 0 refills | Status: DC

## 2017-06-15 MED ORDER — METFORMIN HCL 500 MG PO TABS: 500.0000 mg | ORAL_TABLET | Freq: Three times a day (TID) | ORAL | 0 refills | Status: DC

## 2017-06-16 NOTE — Progress Notes (Signed)
Office: [phone]  /  Fax: [phone]   HPI:   Chief Complaint: OBESITY Steven Hodge is here to discuss his progress with his obesity treatment plan. He is on the Category 3 plan and is following his eating plan approximately 100 % of the time. He states he is exercising 0 minutes 0 times per week. Steven Hodge continues to do well with weight loss on Category 3 plan. She has gone back and forth between Category 3 plan and Low carborhydrates.  His weight is 258 lb (117 kg) today and has had a weight loss of 3 pounds over a period of 6 weeks since his last visit. He has lost 14 lbs since starting treatment with [name].  Diabetes II Steven Hodge has a diagnosis of diabetes type II. Steven Hodge states fasting BGs are elevated to 130 and 150's and last A1c was 6.4 on 06/03/17. He denies any hypoglycemic episodes. He has been working on intensive lifestyle modifications including diet, exercise, and weight loss to help control his blood glucose levels.  Hypertension Steven Hodge is a 57 y.o. male with hypertension. Steven Hodge's blood pressure is controlled on medications. He denies chest pain, lightheadedness, or headache. He is working weight loss to help control his blood pressure with the goal of decreasing his risk of heart attack and stroke. Steven Hodge's blood pressure is currently controlled.  Vitamin D deficiency Steven Hodge has a diagnosis of vitamin D deficiency. He is stable on prescription Vit D, level is increasing slowly but not yet at goal. He denies nausea, vomiting or muscle weakness.  ALLERGIES: No Known Allergies  MEDICATIONS: Current Outpatient Medications on File Prior to Visit  Medication Sig Dispense Refill  . aspirin 325 MG tablet Take 325 mg by mouth daily.    . blood glucose meter kit and supplies Dispense based on patient and insurance preference. Use up to four times daily as directed. (FOR ICD-9 250.00, 250.01). 1 each 0  . Cinnamon 500 MG TABS Take 1,000 mg by mouth daily.    . fish oil-omega-3 fatty  acids 1000 MG capsule Take 2 g by mouth daily.    [name] glucosamine-chondroitin 500-400 MG tablet Take 1 tablet by mouth once.    . Insulin Pen Needle (SURE COMFORT PEN NEEDLES) 32G X 4 MM MISC USE FOR VICTOZA INJECTIONS 100 each 0  . Multiple Vitamin (MULTIVITAMIN) tablet Take 1 tablet by mouth daily.    . TURMERIC PO Take 1,000 mg by mouth 2 (two) times daily.     No current facility-administered medications on file prior to visit.     PAST MEDICAL HISTORY: Past Medical History:  Diagnosis Date  . Back pain   . Cancer ([place]) 06/09/2006   Basal cell carcinoma scalp;   [name] Diabetes mellitus without complication ([place])   . Hx of blood clots   . Hypertension     PAST SURGICAL HISTORY: Past Surgical History:  Procedure Laterality Date  . basel cell cancer removed  2009  . KNEE SURGERY  2005   ACL repair R    SOCIAL HISTORY: Social History   Tobacco Use  . Smoking status: Former Research scientist (life sciences)  . Smokeless tobacco: Never Used  Substance Use Topics  . Alcohol use: No  . Drug use: No    FAMILY HISTORY: Family History  Problem Relation Age of Onset  . Diabetes Mother   . Liver disease Mother   . Obesity Mother   . Cancer Father 68       lung cancer  . Diabetes Father   .  Cancer Sister 5       lung cancer  . Heart disease Brother 9       cardiac stenting/CAD  . Cancer Brother        skin cancer  . Arthritis Brother     ROS: Review of Systems  Constitutional: Positive for weight loss.  Cardiovascular: Negative for chest pain.  Gastrointestinal: Negative for nausea and vomiting.  Musculoskeletal:       Negative muscle weakness  Neurological: Negative for headaches.       Lightheadedness  Endo/Heme/Allergies:       Negative hypoglycemia    PHYSICAL EXAM: Blood pressure 129/80, pulse 83, temperature 98 F (36.7 C), temperature source Oral, height 5' 8" (1.727 m), weight 258 lb (117 kg), SpO2 97 %. Body mass index is 39.23 kg/m. Physical Exam  Constitutional: He is  oriented to person, place, and time. He appears well-developed and well-nourished.  Cardiovascular: Normal rate.  Pulmonary/Chest: Effort normal.  Musculoskeletal: Normal range of motion.  Neurological: He is oriented to person, place, and time.  Skin: Skin is warm and dry.  Psychiatric: He has a normal mood and affect. His behavior is normal.  Vitals reviewed.   RECENT LABS AND TESTS: BMET    Component Value Date/Time   NA 143 06/03/2017 1130   K 4.7 06/03/2017 1130   CL 105 06/03/2017 1130   CO2 24 06/03/2017 1130   GLUCOSE 149 (H) 06/03/2017 1130   GLUCOSE 146 (H) 04/22/2016 1349   BUN 16 06/03/2017 1130   CREATININE 0.87 06/03/2017 1130   CREATININE 0.88 04/22/2016 1349   CALCIUM 9.2 06/03/2017 1130   GFRNONAA 98 06/03/2017 1130   GFRAA 113 06/03/2017 1130   Lab Results  Component Value Date   HGBA1C 6.4 (H) 06/03/2017   HGBA1C 6.0 (H) 02/12/2017   HGBA1C 6.7 (H) 09/18/2016   HGBA1C 6.7 (H) 08/20/2016   HGBA1C 6.1 04/22/2016   Lab Results  Component Value Date   INSULIN 30.2 (H) 02/12/2017   INSULIN 32.4 (H) 09/18/2016   CBC    Component Value Date/Time   WBC 8.5 06/03/2017 1130   WBC 9.8 11/25/2016 1119   WBC 8.2 04/22/2016 1349   RBC 5.15 06/03/2017 1130   RBC 4.57 (A) 11/25/2016 1119   RBC 5.24 04/22/2016 1349   HGB 15.6 06/03/2017 1130   HCT 46.1 06/03/2017 1130   PLT 210 06/03/2017 1130   MCV 90 06/03/2017 1130   MCH 30.3 06/03/2017 1130   MCH 31.5 (A) 11/25/2016 1119   MCH 30.9 04/22/2016 1349   MCHC 33.8 06/03/2017 1130   MCHC 36.1 (A) 11/25/2016 1119   MCHC 34.4 04/22/2016 1349   RDW 13.3 06/03/2017 1130   LYMPHSABS 1.9 06/03/2017 1130   MONOABS 656 04/22/2016 1349   EOSABS 0.2 06/03/2017 1130   BASOSABS 0.0 06/03/2017 1130   Iron/TIBC/Ferritin/ %Sat No results found for: IRON, TIBC, FERRITIN, IRONPCTSAT Lipid Panel     Component Value Date/Time   CHOL 189 06/03/2017 1130   TRIG 132 06/03/2017 1130   HDL 47 06/03/2017 1130   CHOLHDL  4.0 06/03/2017 1130   CHOLHDL 4.4 04/22/2016 1349   VLDL 30 04/22/2016 1349   LDLCALC 116 (H) 06/03/2017 1130   Hepatic Function Panel     Component Value Date/Time   PROT 6.9 06/03/2017 1130   ALBUMIN 4.2 06/03/2017 1130   AST 17 06/03/2017 1130   ALT 27 06/03/2017 1130   ALKPHOS 65 06/03/2017 1130   BILITOT 0.3 06/03/2017 1130  Component Value Date/Time   TSH 2.230 09/18/2016 0956   TSH 2.44 04/22/2016 1349   TSH 1.731 03/09/2015 1524  Results for Steven Hodge, Steven Hodge (MRN [id number]) as of 06/16/2017 08:42  Ref. Range 06/03/2017 11:30  Vitamin D, 25-Hydroxy Latest Ref Range: 30.0 - 100.0 ng/mL 37.2    ASSESSMENT AND PLAN: Type 2 diabetes mellitus without complication, without long-term current use of insulin (HCC) - Plan: metFORMIN (GLUCOPHAGE) 500 MG tablet, liraglutide (VICTOZA) 18 MG/3ML SOPN, glucose blood (BAYER CONTOUR NEXT TEST) test strip  Essential hypertension - Plan: lisinopril (PRINIVIL,ZESTRIL) 5 MG tablet  Vitamin D deficiency - Plan: Vitamin D, Ergocalciferol, (DRISDOL) 50000 units CAPS capsule  Class 2 severe obesity with serious comorbidity and body mass index (BMI) of 39.0 to 39.9 in adult, unspecified obesity type ([place])  PLAN:  Diabetes II Steven Hodge has been given extensive diabetes education by myself today including ideal fasting and post-prandial blood glucose readings, individual ideal Hgb A1c goals and hypoglycemia prevention. We discussed the importance of good blood sugar control to decrease the likelihood of diabetic complications such as nephropathy, neuropathy, limb loss, blindness, coronary artery disease, and death. We discussed the importance of intensive lifestyle modification including diet, exercise and weight loss as the first line treatment for diabetes. Steven Hodge agrees to continue taking metformin 500 mg TID #90 and we will refill for 1 month and he agrees to continue Victoza 0.6 qd and glucose strips and we will refill for 1 month. Steven Hodge agrees to  follow up with our clinic in 2 weeks.  Hypertension We discussed sodium restriction, working on healthy weight loss, and a regular exercise program as the means to achieve improved blood pressure control. Steven Hodge agreed with this plan and agreed to follow up as directed. We will continue to monitor his blood pressure as well as his progress with the above lifestyle modifications. Macy agrees to continue taking lisinopril 5 mg qd #30 and we will refill for 1 month and he will watch for signs of hypotension as he continues his lifestyle modifications. Steven Hodge agrees to follow up with our clinic in 2 weeks.  Vitamin D Deficiency Steven Hodge was informed that low vitamin D levels contributes to fatigue and are associated with obesity, breast, and colon cancer. Steven Hodge agrees to continue taking prescription Vit D _0 ,000 IU every week #4 and we will refill for 1 month. He will follow up for routine testing of vitamin D, at least 2-3 times per year. He was informed of the risk of over-replacement of vitamin D and agrees to not increase his dose unless he discusses this with [name] first. Jamarl agrees to follow up with our clinic in 2 weeks.  Obesity Steven Hodge is currently in the action stage of change. As such, his goal is to continue with weight loss efforts He has agreed to follow a lower carbohydrate, vegetable and lean protein rich diet plan and follow the Category 3 plan Steven Hodge has been instructed to work up to a goal of 150 minutes of combined cardio and strengthening exercise per week for weight loss and overall health benefits. We discussed the following Behavioral Modification Strategies today: increasing lean protein intake, decreasing simple carbohydrates  and work on meal planning and easy cooking plans   Steven Hodge has agreed to follow up with our clinic in 2 weeks. He was informed of the importance of frequent follow up visits to maximize his success with intensive lifestyle modifications for his multiple health  conditions.   OBESITY BEHAVIORAL INTERVENTION VISIT  Today's visit  was # 14 out of 22.  Starting weight: 272 lbs Starting date: 09/18/16 Today's weight : 258 lbs  Today's date: 06/15/2017 Total lbs lost to date: 14 (Patients must lose 7 lbs in the first 6 months to continue with counseling)   ASK: We discussed the diagnosis of obesity with [name] today and Steven Hodge agreed to give [name] permission to discuss obesity behavioral modification therapy today.  ASSESS: Alvia has the diagnosis of obesity and his BMI today is 39.24 Mattie is in the action stage of change   ADVISE: Savva was educated on the multiple health risks of obesity as well as the benefit of weight loss to improve his health. He was advised of the need for long term treatment and the importance of lifestyle modifications.  AGREE: Multiple dietary modification options and treatment options were discussed and  Ebert agreed to the above obesity treatment plan.  I, Trixie Dredge, am acting as transcriptionist for Dennard Nip, MD  I have reviewed the above documentation for accuracy and completeness, and I agree with the above. -Dennard Nip, MD

## 2017-06-29 ENCOUNTER — Ambulatory Visit (INDEPENDENT_AMBULATORY_CARE_PROVIDER_SITE_OTHER): Payer: BLUE CROSS/BLUE SHIELD | Admitting: Family Medicine

## 2017-06-29 ENCOUNTER — Other Ambulatory Visit: Payer: Self-pay

## 2017-06-29 ENCOUNTER — Encounter: Payer: Self-pay | Admitting: Family Medicine

## 2017-06-29 ENCOUNTER — Ambulatory Visit (INDEPENDENT_AMBULATORY_CARE_PROVIDER_SITE_OTHER): Payer: BLUE CROSS/BLUE SHIELD

## 2017-06-29 ENCOUNTER — Encounter (INDEPENDENT_AMBULATORY_CARE_PROVIDER_SITE_OTHER): Payer: Self-pay

## 2017-06-29 VITALS — BP 132/86 | HR 82 | Temp 98.0°F | Resp 16 | Ht 69.69 in | Wt 264.0 lb

## 2017-06-29 DIAGNOSIS — M25462 Effusion, left knee: Secondary | ICD-10-CM

## 2017-06-29 DIAGNOSIS — M1A09X1 Idiopathic chronic gout, multiple sites, with tophus (tophi): Secondary | ICD-10-CM

## 2017-06-29 DIAGNOSIS — M25561 Pain in right knee: Secondary | ICD-10-CM

## 2017-06-29 DIAGNOSIS — M25562 Pain in left knee: Secondary | ICD-10-CM

## 2017-06-29 DIAGNOSIS — M179 Osteoarthritis of knee, unspecified: Secondary | ICD-10-CM | POA: Diagnosis not present

## 2017-06-29 NOTE — Patient Instructions (Addendum)
IF you received an x-ray today, you will receive an invoice from [facility]. Please contact [facility] at [phone] with questions or concerns regarding your invoice.   IF you received labwork today, you will receive an invoice from [facility]. Please contact [facility] at [phone] with questions or concerns regarding your invoice.   Our billing staff will not be able to assist you with questions regarding bills from these companies.  You will be contacted with the lab results as soon as they are available. The fastest way to get your results is to activate your My Chart account. Instructions are located on the last page of this paperwork. If you have not heard from [name] regarding the results in 2 weeks, please contact this office.      Patellar Tendinitis Rehab Ask your health care provider which exercises are safe for you. Do exercises exactly as told by your health care provider and adjust them as directed. It is normal to feel mild stretching, pulling, tightness, or discomfort as you do these exercises, but you should stop right away if you feel sudden pain or your pain gets worse.Do not begin these exercises until told by your health care provider. Stretching and range of motion exercises This exercise warms up your muscles and joints and improves the movement and flexibility of your knee. This exercise also helps to relieve pain and stiffness. Exercise A: Hamstring, doorway  1. Lie on your back in front of a doorway with your __________ leg resting against the wall and your other leg flat on the floor in the doorway. There should be a slight bend in your __________ knee. 2. Straighten your __________ knee. You should feel a stretch behind your knee or thigh. If you do not, scoot your buttocks closer to the door. 3. Hold this position for __________ seconds. Repeat __________ times. Complete this stretch __________ times a day. Strengthening exercises These  exercises build strength and endurance in your knee. Endurance is the ability to use your muscles for a long time, even after they get tired. Exercise B: Quadriceps, isometric  1. Lie on your back with your __________ leg extended and your other knee bent. 2. Slowly tense the muscles in the front of your __________ thigh. When you do this, you should see your kneecap slide up toward your hip or see increased dimpling just above the knee. This motion will push the back of your knee toward the floor. If this is painful, try putting a rolled-up hand towel under your knee to support it in a bent position. Change the size of the towel to find a position that allows you to do this exercise without any pain. 3. For __________ seconds, hold the muscle as tight as you can without increasing your pain. 4. Relax the muscles slowly and completely. Repeat __________ times. Complete this exercise __________ times a day. Exercise C: Straight leg raises ( quadriceps) 1. Lie on your back with your __________ leg extended and your other knee bent. 2. Tense the muscles in the front of your __________ thigh. When you do this, you should see your kneecap slide up or see increased dimpling just above the knee. 3. Keep these muscles tight as you raise your leg 4-6 inches (10-15 cm) off the floor. Do not let your moving knee bend. 4. Hold this position for __________ seconds. 5. Keep these muscles tense as you slowly lower your leg. 6. Relax your muscles slowly and completely. Repeat __________ times. Complete  this exercise __________ times a day. Exercise D: Squats 1. Stand in front of a table, with your feet and knees pointing straight ahead. You may rest your hands on the table for balance but not for support. 2. Slowly bend your knees and lower your hips like you are going to sit in a chair. ? Keep your weight over your heels, not over your toes. ? Keep your lower legs upright so they are parallel with the table  legs. ? Do not let your hips go lower than your knees. ? Do not bend lower than told by your health care provider. ? If your knee pain increases, do not bend as low. 3. Hold the squat position for __________ seconds. 4. Slowly push with your legs to return to standing. Do not use your hands to pull yourself to standing. Repeat __________ times. Complete this exercise __________ times a day. Exercise E: Step-downs 1. Stand on the edge of a step. 2. Keeping your weight over your __________ heel, slowly bend your __________ knee to bring your __________ heel toward the floor. Lower your heel as far as you can while keeping control and without increasing any discomfort. ? Do not let your __________ knee come forward. ? Use your leg muscles, not gravity, to lower your body. ? Hold a wall or rail for balance if needed. 3. Slowly push through your heel to lift your body weight back up. 4. Return to the starting position. Repeat __________ times. Complete this exercise __________ times a day. Exercise F: Straight leg raises ( hip abductors) 1. Lie on your side with your __________ leg in the top position. Lie so your head, shoulder, knee, and hip line up. You may bend your lower knee to help you keep your balance. 2. Roll your hips slightly forward, so that your hips are stacked directly over each other and your __________ knee is facing forward. 3. Leading with your heel, lift your top leg 4-6 inches (10-15 cm). You should feel the muscles in your outer hip lifting. ? Do not let your foot drift forward. ? Do not let your knee roll toward the ceiling. 4. Hold this position for __________ seconds. 5. Slowly lower your leg to the starting position. 6. Let your muscles relax completely after each repetition. Repeat __________ times. Complete this exercise __________ times a day. This information is not intended to replace advice given to you by your health care provider. Make sure you discuss any  questions you have with your health care provider. Document Released: 05/26/2005 Document Revised: 01/31/2016 Document Reviewed: 02/27/2015 Elsevier Interactive Patient Education  [employer].

## 2017-06-29 NOTE — Progress Notes (Signed)
Subjective:    Patient ID: Steven Hodge, male    DOB: [birth year], 57 y.o.   MRN: [id number]  06/29/2017  Knee Pain (pt states right knee has been giving him servere pain x 1 week. )    HPI This 57 y.o. male presents for evaluation of RIGHT knee pain.  Onset one week ago.  Feels like knee cap rubbing on thigh bone.  Made appointment last Thursday; Mildly hot.  Onset ten days ago.  Unable to sleep initially; missed work one day.  Mild swelling; badly swollen.   No injury.  No fever/chill/sweats.  This does not feel gout.  Aleve and Motrin.  Knocking.  No popping.  Not in joint.  Feels like under knee cap.  Motion hurts.  Up and down leg. Icing makes worsen.  Heat helps.       No high uric acid level; gets one gouty attack once per year.  Gout in L [place].  L foot gout.      BP Readings from Last 3 Encounters:  06/29/17 132/86  06/15/17 129/80  06/03/17 128/72   Wt Readings from Last 3 Encounters:  06/29/17 264 lb (119.7 kg)  06/15/17 258 lb (117 kg)  06/03/17 262 lb (118.8 kg)   Immunization History  Administered Date(s) Administered  . Hepatitis A, Adult 12/02/2016, 06/03/2017  . Hepatitis B, adult 04/22/2016, 08/20/2016, 12/02/2016  . Influenza,inj,Quad PF,6+ Mos 03/09/2015, 04/22/2016, 06/03/2017  . Pneumococcal Polysaccharide-23 03/09/2015  . Tdap 06/09/2008    Review of Systems  Constitutional: Negative for activity change, appetite change, chills, diaphoresis, fatigue and fever.  Respiratory: Negative for cough and shortness of breath.   Cardiovascular: Negative for chest pain, palpitations and leg swelling.  Musculoskeletal: Positive for arthralgias, gait problem and joint swelling. Negative for back pain.  Skin: Negative for color change, pallor, rash and wound.  Neurological: Negative for numbness.  Psychiatric/Behavioral: Positive for sleep disturbance.    Past Medical History:  Diagnosis Date  . Back pain   . Cancer ([place]) 06/09/2006   Basal cell carcinoma  scalp;   [name] Diabetes mellitus without complication ([place])   . Hx of blood clots   . Hypertension    Past Surgical History:  Procedure Laterality Date  . basel cell cancer removed  2009  . KNEE SURGERY  2005   ACL repair R   No Known Allergies Current Outpatient Medications on File Prior to Visit  Medication Sig Dispense Refill  . aspirin 325 MG tablet Take 325 mg by mouth daily.    . blood glucose meter kit and supplies Dispense based on patient and insurance preference. Use up to four times daily as directed. (FOR ICD-9 250.00, 250.01). 1 each 0  . Cinnamon 500 MG TABS Take 1,000 mg by mouth daily.    . fish oil-omega-3 fatty acids 1000 MG capsule Take 2 g by mouth daily.    [name] glucosamine-chondroitin 500-400 MG tablet Take 1 tablet by mouth once.    [name] glucose blood (BAYER CONTOUR NEXT TEST) test strip USE TO CHECK BLOOD SUGAR UP TO 4 TIMES ADAY 100 each 0  . Insulin Pen Needle (SURE COMFORT PEN NEEDLES) 32G X 4 MM MISC USE FOR VICTOZA INJECTIONS 100 each 0  . liraglutide (VICTOZA) 18 MG/3ML SOPN Inject 0.1 mLs (0.6 mg total) into the skin every morning. 1 pen 0  . lisinopril (PRINIVIL,ZESTRIL) 5 MG tablet Take 1 tablet (5 mg total) by mouth daily. 30 tablet 0  . metFORMIN (GLUCOPHAGE) 500 MG  tablet Take 1 tablet (500 mg total) by mouth 3 (three) times daily. 90 tablet 0  . Multiple Vitamin (MULTIVITAMIN) tablet Take 1 tablet by mouth daily.    . TURMERIC PO Take 1,000 mg by mouth 2 (two) times daily.    . Vitamin D, Ergocalciferol, (DRISDOL) 50000 units CAPS capsule Take 1 capsule (50,000 Units total) by mouth every 7 (seven) days. 4 capsule 0   No current facility-administered medications on file prior to visit.    Social History   Socioeconomic History  . Marital status: Married    Spouse name: Not on file  . Number of children: Not on file  . Years of education: Not on file  . Highest education level: Not on file  Social Needs  . Financial resource strain: Not on file  .  Food insecurity - worry: Not on file  . Food insecurity - inability: Not on file  . Transportation needs - medical: Not on file  . Transportation needs - non-medical: Not on file  Occupational History  . Occupation: Medical illustrator, Dealer  Tobacco Use  . Smoking status: Former Research scientist (life sciences)  . Smokeless tobacco: Never Used  Substance and Sexual Activity  . Alcohol use: No  . Drug use: No  . Sexual activity: Yes  Other Topics Concern  . 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  Walking once per week in 2018.         Family History  Problem Relation Age of Onset  . Diabetes Mother   . Liver disease Mother   . Obesity Mother   . Cancer Father 40       lung cancer  . Diabetes Father   . Cancer Sister 34       lung cancer  . Heart disease Brother 44       cardiac stenting/CAD  . Cancer Brother        skin cancer  . Arthritis Brother        Objective:    BP 132/86   Pulse 82   Temp 98 F (36.7 C) (Oral)   Resp 16   Ht 5' 9.69" (1.77 m)   Wt 264 lb (119.7 kg)   SpO2 98%   BMI 38.22 kg/m  Physical Exam  Constitutional: He is oriented to person, place, and time. He appears well-developed and well-nourished. No distress.  HENT:  Head: Normocephalic and atraumatic.  Eyes: Conjunctivae and EOM are normal. Pupils are equal, round, and reactive to light.  Neck: Normal range of motion. Neck supple. Carotid bruit is not present. No thyromegaly present.  Cardiovascular: Normal rate, regular rhythm, normal heart sounds and intact distal pulses. Exam reveals no gallop and no friction rub.  No murmur heard. Pulmonary/Chest: Effort normal and breath sounds normal. He has no wheezes. He has no rales.  Musculoskeletal:         Right knee: He exhibits decreased range of motion, swelling and effusion. He exhibits no bony tenderness. Tenderness found. Patellar tendon tenderness noted.  Legs: R KNEE: Decreased range of motion with extension and flexion.  Lachman's negative.  Anterior drawer negative.  McMurray's negative.  Very minimal swelling.  Isolated area of bony prominence, lateral superior aspect of knee that is nontender to touch.  No warmth L KNEE: Full range of motion.  There are 2 discrete bony prominence as outlined that are nontender and without fluctuance or warmth.  Lymphadenopathy:    He has no cervical adenopathy.  Neurological: He is alert and oriented to person, place, and time. No cranial nerve deficit.  Skin: Skin is warm and dry. No rash noted. He is not diaphoretic.  Psychiatric: He has a normal mood and affect. His behavior is normal.  Nursing note and vitals reviewed.  No results found. Depression screen [facility] 2/9 06/29/2017 06/03/2017 05/20/2017 05/16/2017 12/02/2016  Decreased Interest 0 0 0 0 0  Down, Depressed, Hopeless 0 0 0 0 0  PHQ - 2 Score 0 0 0 0 0  Altered sleeping - - - - -  Tired, decreased energy - - - - -  Change in appetite - - - - -  Feeling bad or failure about yourself  - - - - -  Trouble concentrating - - - - -  Moving slowly or fidgety/restless - - - - -  Suicidal thoughts - - - - -  PHQ-9 Score - - - - -   Fall Risk  06/29/2017 06/03/2017 05/20/2017 05/16/2017 12/02/2016  Falls in the past year? No No No Yes No  Comment - - - x 2 days ago -        Assessment & Plan:   1. Acute pain of right knee   2. Pain and swelling of left knee   3. Idiopathic chronic gout of multiple sites with tophus    New onset acute pain of right knee.  No trauma.  Obtain right knee x-rays.  Small bony prominence on right knee suggestive of gouty tophi yet patient states that current symptoms are not consistent with gout.  Pain significantly improved in the past 48 hours.   Continue with ibuprofen yet recommend decreasing usage.  Home exercise program provided.  Patient declined opiate for nighttime pain management.  Advised patient to call if pain worsens.  If no improvement or complete resolution of knee pain in the next 1-2 weeks, please call for orthopedic referral.  Chronic localized bony prominence of left knee.  Suggestive of gouty tophi as well.  Suffering with 1 gout flare per year and recent uric acid level moderate at around 7.  Consider initiation of allopurinol therapy in the future.  Orders Placed This Encounter  Procedures  . DG Knee Complete 4 Views Right    Standing Status:   Future    Number of Occurrences:   1    Standing Expiration Date:   06/29/2018    Order Specific Question:   Reason for Exam (SYMPTOM  OR DIAGNOSIS REQUIRED)    Answer:   R knee pain severe onset 10 days ago; no injury; history of gout; history of ACL repair R knee 2005; swelling    Order Specific Question:   Preferred imaging location?    Answer:   External  . DG Knee Complete 4 Views Left    Standing Status:   Future    Number of Occurrences:   1    Standing Expiration Date:   06/29/2018    Order Specific Question:   Reason for Exam (SYMPTOM  OR DIAGNOSIS REQUIRED)  Answer:   L knee localized swelling x 2; chronic; no pain; FB subcutaneous years ago.    Order Specific Question:   Preferred imaging location?    Answer:   External   No orders of the defined types were placed in this encounter.   No Follow-up on file.   Marjie Chea Elayne Guerin, M.D. Primary [facility] at [facility] [address] [place], [place]  [address] [phone] phone [phone] fax

## 2017-07-03 DIAGNOSIS — M1A09X1 Idiopathic chronic gout, multiple sites, with tophus (tophi): Secondary | ICD-10-CM | POA: Insufficient documentation

## 2017-07-03 HISTORY — DX: Idiopathic chronic gout, multiple sites, with tophus (tophi): M1A.09X1

## 2017-07-06 ENCOUNTER — Ambulatory Visit (INDEPENDENT_AMBULATORY_CARE_PROVIDER_SITE_OTHER): Payer: BLUE CROSS/BLUE SHIELD | Admitting: Family Medicine

## 2017-07-06 VITALS — BP 127/76 | HR 82 | Temp 98.1°F | Ht 68.0 in | Wt 254.0 lb

## 2017-07-06 DIAGNOSIS — I10 Essential (primary) hypertension: Secondary | ICD-10-CM

## 2017-07-06 DIAGNOSIS — E559 Vitamin D deficiency, unspecified: Secondary | ICD-10-CM | POA: Diagnosis not present

## 2017-07-06 DIAGNOSIS — Z6838 Body mass index (BMI) 38.0-38.9, adult: Secondary | ICD-10-CM | POA: Diagnosis not present

## 2017-07-06 DIAGNOSIS — E1165 Type 2 diabetes mellitus with hyperglycemia: Secondary | ICD-10-CM | POA: Diagnosis not present

## 2017-07-06 HISTORY — DX: Type 2 diabetes mellitus with hyperglycemia: E11.65

## 2017-07-06 MED ORDER — METFORMIN HCL 500 MG PO TABS: 500.0000 mg | ORAL_TABLET | Freq: Three times a day (TID) | ORAL | 0 refills | Status: DC

## 2017-07-06 MED ORDER — VITAMIN D (ERGOCALCIFEROL) 1.25 MG (50000 UNIT) PO CAPS: 50000.0000 [IU] | ORAL_CAPSULE | ORAL | 0 refills | Status: DC

## 2017-07-06 MED ORDER — LISINOPRIL 5 MG PO TABS: 5.0000 mg | ORAL_TABLET | Freq: Every day | ORAL | 0 refills | Status: DC

## 2017-07-07 ENCOUNTER — Encounter: Payer: Self-pay | Admitting: Family Medicine

## 2017-07-08 NOTE — Progress Notes (Signed)
Office: [phone]  /  Fax: [phone]   HPI:   Chief Complaint: OBESITY Steven Hodge is here to discuss his progress with his obesity treatment plan. He is on the Category 3 plan and is following his eating plan approximately 100 % of the time. He states he is exercising 30 minutes 1 time per week. Steven Hodge continues to do very well with weight loss. He is doing well following the category 3 plan and increasing his vegetables. Steven Hodge is physically active at his job and has increased walking since his knee is feeling better. His weight is 254 lb (115.2 kg) today and has had a weight loss of 4 pounds over a period of 3 weeks since his last visit. He has lost 18 lbs since starting treatment with [name].  Vitamin D deficiency Steven Hodge has a diagnosis of vitamin D deficiency. He is on prescription vit D and is not yet at goal. Fatigue is improving and he denies nausea, vomiting or muscle weakness.   Ref. Range 06/03/2017 11:30  Vitamin D, 25-Hydroxy Latest Ref Range: 30.0 - 100.0 ng/mL 37.2   Diabetes II Steven Hodge has a diagnosis of diabetes type II. He started victoza and is doing well. He has slowly increased to 0.6 mg. Steven Hodge states his fasting BGs range between 130 and 160, 2 hour post prandial range in the 120's. He had felt overly full on victoza in the past and he had one episode of vomiting, so he had stopped previously. He has been working on intensive lifestyle modifications including diet, exercise, and weight loss to help control his blood glucose levels.  Hypertension Steven Hodge is a 57 y.o. male with hypertension. Steven Hodge denies chest pain or headache. Steven Hodge is attempting to improve his blood pressure with diet. He is working weight loss to help control his blood pressure with the goal of decreasing his risk of heart attack and stroke. [name] blood pressure is stable.  ALLERGIES: No Known Allergies  MEDICATIONS: Current Outpatient Medications on File Prior to Visit  Medication Sig  Dispense Refill  . aspirin 325 MG tablet Take 325 mg by mouth daily.    . blood glucose meter kit and supplies Dispense based on patient and insurance preference. Use up to four times daily as directed. (FOR ICD-9 250.00, 250.01). 1 each 0  . Cinnamon 500 MG TABS Take 1,000 mg by mouth daily.    . fish oil-omega-3 fatty acids 1000 MG capsule Take 2 g by mouth daily.    [name] glucosamine-chondroitin 500-400 MG tablet Take 1 tablet by mouth once.    [name] glucose blood (BAYER CONTOUR NEXT TEST) test strip USE TO CHECK BLOOD SUGAR UP TO 4 TIMES ADAY 100 each 0  . Insulin Pen Needle (SURE COMFORT PEN NEEDLES) 32G X 4 MM MISC USE FOR VICTOZA INJECTIONS 100 each 0  . liraglutide (VICTOZA) 18 MG/3ML SOPN Inject 0.1 mLs (0.6 mg total) into the skin every morning. 1 pen 0  . Multiple Vitamin (MULTIVITAMIN) tablet Take 1 tablet by mouth daily.    . TURMERIC PO Take 1,000 mg by mouth 2 (two) times daily.     No current facility-administered medications on file prior to visit.     PAST MEDICAL HISTORY: Past Medical History:  Diagnosis Date  . Back pain   . Cancer ([place]) 06/09/2006   Basal cell carcinoma scalp;   [name] Diabetes mellitus without complication ([place])   . Hx of blood clots   . Hypertension     PAST  SURGICAL HISTORY: Past Surgical History:  Procedure Laterality Date  . basel cell cancer removed  2009  . KNEE SURGERY  2005   ACL repair R    SOCIAL HISTORY: Social History   Tobacco Use  . Smoking status: Former Research scientist (life sciences)  . Smokeless tobacco: Never Used  Substance Use Topics  . Alcohol use: No  . Drug use: No    FAMILY HISTORY: Family History  Problem Relation Age of Onset  . Diabetes Mother   . Liver disease Mother   . Obesity Mother   . Cancer Father 79       lung cancer  . Diabetes Father   . Cancer Sister 37       lung cancer  . Heart disease Brother 63       cardiac stenting/CAD  . Cancer Brother        skin cancer  . Arthritis Brother     ROS: Review of Systems    Constitutional: Positive for malaise/fatigue and weight loss.  Cardiovascular: Negative for chest pain.  Gastrointestinal: Negative for nausea and vomiting.  Musculoskeletal:       Negative muscle weakness  Neurological: Negative for headaches.    PHYSICAL EXAM: Blood pressure 127/76, pulse 82, temperature 98.1 F (36.7 C), temperature source Oral, height 5' 8" (1.727 m), weight 254 lb (115.2 kg), SpO2 94 %. Body mass index is 38.62 kg/m. Physical Exam  Constitutional: He is oriented to person, place, and time. He appears well-developed and well-nourished.  Cardiovascular: Normal rate.  Pulmonary/Chest: Effort normal.  Musculoskeletal: Normal range of motion.  Neurological: He is oriented to person, place, and time.  Skin: Skin is warm and dry.  Psychiatric: He has a normal mood and affect. His behavior is normal.  Vitals reviewed.   RECENT LABS AND TESTS: BMET    Component Value Date/Time   NA 143 06/03/2017 1130   K 4.7 06/03/2017 1130   CL 105 06/03/2017 1130   CO2 24 06/03/2017 1130   GLUCOSE 149 (H) 06/03/2017 1130   GLUCOSE 146 (H) 04/22/2016 1349   BUN 16 06/03/2017 1130   CREATININE 0.87 06/03/2017 1130   CREATININE 0.88 04/22/2016 1349   CALCIUM 9.2 06/03/2017 1130   GFRNONAA 98 06/03/2017 1130   GFRAA 113 06/03/2017 1130   Lab Results  Component Value Date   HGBA1C 6.4 (H) 06/03/2017   HGBA1C 6.0 (H) 02/12/2017   HGBA1C 6.7 (H) 09/18/2016   HGBA1C 6.7 (H) 08/20/2016   HGBA1C 6.1 04/22/2016   Lab Results  Component Value Date   INSULIN 30.2 (H) 02/12/2017   INSULIN 32.4 (H) 09/18/2016   CBC    Component Value Date/Time   WBC 8.5 06/03/2017 1130   WBC 9.8 11/25/2016 1119   WBC 8.2 04/22/2016 1349   RBC 5.15 06/03/2017 1130   RBC 4.57 (A) 11/25/2016 1119   RBC 5.24 04/22/2016 1349   HGB 15.6 06/03/2017 1130   HCT 46.1 06/03/2017 1130   PLT 210 06/03/2017 1130   MCV 90 06/03/2017 1130   MCH 30.3 06/03/2017 1130   MCH 31.5 (A) 11/25/2016 1119    MCH 30.9 04/22/2016 1349   MCHC 33.8 06/03/2017 1130   MCHC 36.1 (A) 11/25/2016 1119   MCHC 34.4 04/22/2016 1349   RDW 13.3 06/03/2017 1130   LYMPHSABS 1.9 06/03/2017 1130   MONOABS 656 04/22/2016 1349   EOSABS 0.2 06/03/2017 1130   BASOSABS 0.0 06/03/2017 1130   Iron/TIBC/Ferritin/ %Sat No results found for: IRON, TIBC, FERRITIN, IRONPCTSAT Lipid  Panel     Component Value Date/Time   CHOL 189 06/03/2017 1130   TRIG 132 06/03/2017 1130   HDL 47 06/03/2017 1130   CHOLHDL 4.0 06/03/2017 1130   CHOLHDL 4.4 04/22/2016 1349   VLDL 30 04/22/2016 1349   LDLCALC 116 (H) 06/03/2017 1130   Hepatic Function Panel     Component Value Date/Time   PROT 6.9 06/03/2017 1130   ALBUMIN 4.2 06/03/2017 1130   AST 17 06/03/2017 1130   ALT 27 06/03/2017 1130   ALKPHOS 65 06/03/2017 1130   BILITOT 0.3 06/03/2017 1130      Component Value Date/Time   TSH 2.230 09/18/2016 0956   TSH 2.44 04/22/2016 1349   TSH 1.731 03/09/2015 1524     Ref. Range 06/03/2017 11:30  Vitamin D, 25-Hydroxy Latest Ref Range: 30.0 - 100.0 ng/mL 37.2   ASSESSMENT AND PLAN: Type 2 diabetes mellitus with hyperglycemia, without long-term current use of insulin (HCC) - Plan: metFORMIN (GLUCOPHAGE) 500 MG tablet  Essential hypertension - Plan: lisinopril (PRINIVIL,ZESTRIL) 5 MG tablet  Vitamin D deficiency - Plan: Vitamin D, Ergocalciferol, (DRISDOL) 50000 units CAPS capsule  Class 2 severe obesity with serious comorbidity and body mass index (BMI) of 38.0 to 38.9 in adult, unspecified obesity type ([place])  PLAN:  Vitamin D Deficiency Zaryan was informed that low vitamin D levels contributes to fatigue and are associated with obesity, breast, and colon cancer. He agrees to continue to take prescription Vit D _0 ,000 IU every week #4 with no refills and will follow up for routine testing of vitamin D, at least 2-3 times per year. He was informed of the risk of over-replacement of vitamin D and agrees to not increase  his dose unless he discusses this with [name] first. Steven Hodge agrees to follow up with our clinic in 4 weeks.  Diabetes II Steven Hodge has been given extensive diabetes education by myself today including ideal fasting and post-prandial blood glucose readings, individual ideal Hgb A1c goals and hypoglycemia prevention. We discussed the importance of good blood sugar control to decrease the likelihood of diabetic complications such as nephropathy, neuropathy, limb loss, blindness, coronary artery disease, and death. We discussed the importance of intensive lifestyle modification including diet, exercise and weight loss as the first line treatment for diabetes. Steven Hodge agrees to slowly increase victoza up to 0.9 mg in the next month and we will follow closely. He agrees to continue metformin 500 mg tid #90 with no refills and follow up at the agreed upon time.  Hypertension We discussed sodium restriction, working on healthy weight loss, and a regular exercise program as the means to achieve improved blood pressure control. Steven Hodge agreed with this plan and agreed to follow up as directed. We will continue to monitor his blood pressure as well as his progress with the above lifestyle modifications. He agrees to continue lisinopril 5 mg qd #30 with no refills and will watch for signs of hypotension as he continues his lifestyle modifications.  Obesity Steven Hodge is currently in the action stage of change. As such, his goal is to continue with weight loss efforts He has agreed to follow the Category 3 plan Steven Hodge has been instructed to work up to a goal of 150 minutes of combined cardio and strengthening exercise per week for weight loss and overall health benefits. We discussed the following Behavioral Modification Strategies today: keeping healthy foods in the home, decreasing simple carbohydrates  and increasing vegetables  Steven Hodge has agreed to follow up with our clinic  in 4 weeks. He was informed of the importance of  frequent follow up visits to maximize his success with intensive lifestyle modifications for his multiple health conditions.   OBESITY BEHAVIORAL INTERVENTION VISIT  Today's visit was # 15 out of 22.  Starting weight: 272 lbs Starting date: 09/18/16 Today's weight : 254 lbs  Today's date: 07/06/2017 Total lbs lost to date: 49 (Patients must lose 7 lbs in the first 6 months to continue with counseling)   ASK: We discussed the diagnosis of obesity with Steven Hodge today and Steven Hodge agreed to give [name] permission to discuss obesity behavioral modification therapy today.  ASSESS: Steven Hodge has the diagnosis of obesity and his BMI today is 38.63 Steven Hodge is in the action stage of change   ADVISE: Steven Hodge was educated on the multiple health risks of obesity as well as the benefit of weight loss to improve his health. He was advised of the need for long term treatment and the importance of lifestyle modifications.  AGREE: Multiple dietary modification options and treatment options were discussed and  Steven Hodge agreed to the above obesity treatment plan.  I, Doreene Nest, am acting as transcriptionist for Dennard Nip, MD  I have reviewed the above documentation for accuracy and completeness, and I agree with the above. -Dennard Nip, MD

## 2017-08-03 ENCOUNTER — Ambulatory Visit (INDEPENDENT_AMBULATORY_CARE_PROVIDER_SITE_OTHER): Payer: BLUE CROSS/BLUE SHIELD | Admitting: Family Medicine

## 2017-08-03 VITALS — BP 119/76 | HR 83 | Temp 97.6°F | Ht 68.0 in | Wt 257.0 lb

## 2017-08-03 DIAGNOSIS — E559 Vitamin D deficiency, unspecified: Secondary | ICD-10-CM

## 2017-08-03 DIAGNOSIS — Z6839 Body mass index (BMI) 39.0-39.9, adult: Secondary | ICD-10-CM

## 2017-08-03 DIAGNOSIS — I10 Essential (primary) hypertension: Secondary | ICD-10-CM | POA: Diagnosis not present

## 2017-08-03 DIAGNOSIS — Z9189 Other specified personal risk factors, not elsewhere classified: Secondary | ICD-10-CM

## 2017-08-03 DIAGNOSIS — E119 Type 2 diabetes mellitus without complications: Secondary | ICD-10-CM | POA: Diagnosis not present

## 2017-08-03 MED ORDER — LIRAGLUTIDE 18 MG/3ML ~~LOC~~ SOPN: 0.6000 mg | PEN_INJECTOR | Freq: Every morning | SUBCUTANEOUS | 0 refills | Status: DC

## 2017-08-03 MED ORDER — INSULIN PEN NEEDLE 32G X 4 MM MISC: 0 refills | Status: DC

## 2017-08-03 MED ORDER — LISINOPRIL 5 MG PO TABS: 5.0000 mg | ORAL_TABLET | Freq: Every day | ORAL | 0 refills | Status: DC

## 2017-08-03 MED ORDER — VITAMIN D (ERGOCALCIFEROL) 1.25 MG (50000 UNIT) PO CAPS: 50000.0000 [IU] | ORAL_CAPSULE | ORAL | 0 refills | Status: DC

## 2017-08-03 MED ORDER — METFORMIN HCL 500 MG PO TABS: 500.0000 mg | ORAL_TABLET | Freq: Three times a day (TID) | ORAL | 0 refills | Status: DC

## 2017-08-04 NOTE — Progress Notes (Signed)
Office: [phone]  /  Fax: [phone]   HPI:   Chief Complaint: OBESITY Steven Hodge is here to discuss his progress with his obesity treatment plan. He is on the Category 3 plan and is following his eating plan approximately 100 % of the time. He states he is walking 30 minutes 1 to 2 times per week. Steven Hodge did the low carb plan well for 2 weeks and changed back to the category 3 plan, but noted increased fluid retention. He is currently mixing low carb with the category 3 plan, but is not getting good results. His weight is 257 lb (116.6 kg) today and has had a weight gain of 3 pounds over a period of 4 weeks since his last visit. He has lost 15 lbs since starting treatment with [name].  Diabetes II Steven Hodge has a diagnosis of diabetes type II. He increased Victoza to 0.9 mg. Steven Hodge had increased GI upset, and difficulty with swallowing, so he decreased back to 0.6 mg and slowly has increased back to 0.9 mg. Steven Hodge states fasting BGs mostly range between 140 and 150 and denies any hypoglycemic episodes. Last A1c was at 6.4 He has been working on intensive lifestyle modifications including diet, exercise, and weight loss to help control his blood glucose levels.  Hypertension Steven Hodge is a 57 y.o. male with hypertension. Steven Hodge denies chest pain, headache or lightheadedness. He is working weight loss to help control his blood Hodge with the goal of decreasing his risk of heart attack and stroke. Steven Hodge is well controlled on lisinopril.  At risk for cardiovascular disease Steven Hodge is at a higher than average risk for cardiovascular disease due to obesity, diabetes and hypertension. He currently denies any chest pain.  Vitamin D deficiency Steven Hodge has a diagnosis of vitamin D deficiency. He is stable on vit D but is not yet at goal. Steven Hodge denies nausea, vomiting or muscle weakness and fatigue has improved.   Ref. Range 06/03/2017 11:30  Vitamin D, 25-Hydroxy Latest Ref Range:  30.0 - 100.0 ng/mL 37.2   ALLERGIES: No Known Allergies  MEDICATIONS: Current Outpatient Medications on File Prior to Visit  Medication Sig Dispense Refill  . aspirin 325 MG tablet Take 325 mg by mouth daily.    . blood glucose meter kit and supplies Dispense based on patient and insurance preference. Use up to four times daily as directed. (FOR ICD-9 250.00, 250.01). 1 each 0  . Cinnamon 500 MG TABS Take 1,000 mg by mouth daily.    . fish oil-omega-3 fatty acids 1000 MG capsule Take 2 g by mouth daily.    [name] glucosamine-chondroitin 500-400 MG tablet Take 1 tablet by mouth once.    [name] glucose blood (BAYER CONTOUR NEXT TEST) test strip USE TO CHECK BLOOD SUGAR UP TO 4 TIMES ADAY 100 each 0  . Multiple Vitamin (MULTIVITAMIN) tablet Take 1 tablet by mouth daily.    . TURMERIC PO Take 1,000 mg by mouth 2 (two) times daily.     No current facility-administered medications on file prior to visit.     PAST MEDICAL HISTORY: Past Medical History:  Diagnosis Date  . Back pain   . Cancer ([place]) 06/09/2006   Basal cell carcinoma scalp;   [name] Diabetes mellitus without complication ([place])   . Hx of blood clots   . Hypertension     PAST SURGICAL HISTORY: Past Surgical History:  Procedure Laterality Date  . basel cell cancer removed  2009  . KNEE  SURGERY  2005   ACL repair R    SOCIAL HISTORY: Social History   Tobacco Use  . Smoking status: Former Research scientist (life sciences)  . Smokeless tobacco: Never Used  Substance Use Topics  . Alcohol use: No  . Drug use: No    FAMILY HISTORY: Family History  Problem Relation Age of Onset  . Diabetes Mother   . Liver disease Mother   . Obesity Mother   . Cancer Father 61       lung cancer  . Diabetes Father   . Cancer Sister 24       lung cancer  . Heart disease Brother 72       cardiac stenting/CAD  . Cancer Brother        skin cancer  . Arthritis Brother     ROS: Review of Systems  Constitutional: Negative for malaise/fatigue and weight loss.  HENT:         Positive for ifficulty swallowing  Cardiovascular: Negative for chest pain.  Gastrointestinal: Positive for nausea. Negative for vomiting.  Musculoskeletal:       Negative for muscle weakness  Neurological: Negative for headaches.       Negative for lightheadedness    PHYSICAL EXAM: Blood Hodge 119/76, pulse 83, temperature 97.6 F (36.4 C), temperature source Oral, height '5\' 8"'  (1.727 m), weight 257 lb (116.6 kg), SpO2 93 %. Body mass index is 39.08 kg/m. Physical Exam  Constitutional: He is oriented to person, place, and time. He appears well-developed and well-nourished.  Cardiovascular: Normal rate.  Pulmonary/Chest: Effort normal.  Musculoskeletal: Normal range of motion.  Neurological: He is oriented to person, place, and time.  Skin: Skin is warm and dry.  Psychiatric: He has a normal mood and affect. His behavior is normal.  Vitals reviewed.   RECENT LABS AND TESTS: BMET    Component Value Date/Time   NA 143 06/03/2017 1130   K 4.7 06/03/2017 1130   CL 105 06/03/2017 1130   CO2 24 06/03/2017 1130   GLUCOSE 149 (H) 06/03/2017 1130   GLUCOSE 146 (H) 04/22/2016 1349   BUN 16 06/03/2017 1130   CREATININE 0.87 06/03/2017 1130   CREATININE 0.88 04/22/2016 1349   CALCIUM 9.2 06/03/2017 1130   GFRNONAA 98 06/03/2017 1130   GFRAA 113 06/03/2017 1130   Lab Results  Component Value Date   HGBA1C 6.4 (H) 06/03/2017   HGBA1C 6.0 (H) 02/12/2017   HGBA1C 6.7 (H) 09/18/2016   HGBA1C 6.7 (H) 08/20/2016   HGBA1C 6.1 04/22/2016   Lab Results  Component Value Date   INSULIN 30.2 (H) 02/12/2017   INSULIN 32.4 (H) 09/18/2016   CBC    Component Value Date/Time   WBC 8.5 06/03/2017 1130   WBC 9.8 11/25/2016 1119   WBC 8.2 04/22/2016 1349   RBC 5.15 06/03/2017 1130   RBC 4.57 (A) 11/25/2016 1119   RBC 5.24 04/22/2016 1349   HGB 15.6 06/03/2017 1130   HCT 46.1 06/03/2017 1130   PLT 210 06/03/2017 1130   MCV 90 06/03/2017 1130   MCH 30.3 06/03/2017 1130    MCH 31.5 (A) 11/25/2016 1119   MCH 30.9 04/22/2016 1349   MCHC 33.8 06/03/2017 1130   MCHC 36.1 (A) 11/25/2016 1119   MCHC 34.4 04/22/2016 1349   RDW 13.3 06/03/2017 1130   LYMPHSABS 1.9 06/03/2017 1130   MONOABS 656 04/22/2016 1349   EOSABS 0.2 06/03/2017 1130   BASOSABS 0.0 06/03/2017 1130   Iron/TIBC/Ferritin/ %Sat No results found for: IRON, TIBC,  FERRITIN, IRONPCTSAT Lipid Panel     Component Value Date/Time   CHOL 189 06/03/2017 1130   TRIG 132 06/03/2017 1130   HDL 47 06/03/2017 1130   CHOLHDL 4.0 06/03/2017 1130   CHOLHDL 4.4 04/22/2016 1349   VLDL 30 04/22/2016 1349   LDLCALC 116 (H) 06/03/2017 1130   Hepatic Function Panel     Component Value Date/Time   PROT 6.9 06/03/2017 1130   ALBUMIN 4.2 06/03/2017 1130   AST 17 06/03/2017 1130   ALT 27 06/03/2017 1130   ALKPHOS 65 06/03/2017 1130   BILITOT 0.3 06/03/2017 1130      Component Value Date/Time   TSH 2.230 09/18/2016 0956   TSH 2.44 04/22/2016 1349   TSH 1.731 03/09/2015 1524    ASSESSMENT AND PLAN: Type 2 diabetes mellitus without complication, without long-term current use of insulin (HCC) - Plan: liraglutide (VICTOZA) 18 MG/3ML SOPN, metFORMIN (GLUCOPHAGE) 500 MG tablet, Insulin Pen Needle (SURE COMFORT PEN NEEDLES) 32G X 4 MM MISC  Vitamin D deficiency - Plan: Vitamin D, Ergocalciferol, (DRISDOL) 50000 units CAPS capsule  Essential hypertension - Plan: lisinopril (PRINIVIL,ZESTRIL) 5 MG tablet  At risk for heart disease  Class 2 severe obesity with serious comorbidity and body mass index (BMI) of 39.0 to 39.9 in adult, unspecified obesity type ([place])  PLAN:  Diabetes II Steven Hodge has been given extensive diabetes education by myself today including ideal fasting and post-prandial blood glucose readings, individual ideal Hgb A1c goals and hypoglycemia prevention. We discussed the importance of good blood sugar control to decrease the likelihood of diabetic complications such as nephropathy, neuropathy,  limb loss, blindness, coronary artery disease, and death. We discussed the importance of intensive lifestyle modification including diet, exercise and weight loss as the first line treatment for diabetes. Steven Hodge agrees to continue victoza at 0.9 mg, we will refill at 1.2 mg #2 pack and pen needles. He agrees to continue metformin 500 mg tid #90 with no refills and follow up at the agreed upon time.  Hypertension We discussed sodium restriction, working on healthy weight loss, and a regular exercise program as the means to achieve improved blood Hodge control. Steven Hodge agreed with this plan and agreed to follow up as directed. We will continue to monitor his blood Hodge as well as his progress with the above lifestyle modifications. He agrees to continue lisinopril 5 mg qd #30 with no refills and will watch for signs of hypotension as he continues his lifestyle modifications.  Cardiovascular risk counseling Steven Hodge was given extended (15 minutes) coronary artery disease prevention counseling today. He is 57 y.o. male and has risk factors for heart disease including obesity, diabetes and hypertension. We discussed intensive lifestyle modifications today with an emphasis on specific weight loss instructions and strategies. Pt was also informed of the importance of increasing exercise and decreasing saturated fats to help prevent heart disease.  Vitamin D Deficiency Steven Hodge was informed that low vitamin D levels contributes to fatigue and are associated with obesity, breast, and colon cancer. He agrees to continue to take prescription Vit D '@50' ,000 IU every week #4 with no refills and will follow up for routine testing of vitamin D, at least 2-3 times per year. He was informed of the risk of over-replacement of vitamin D and agrees to not increase his dose unless he discusses this with [name] first.  Obesity Steven Hodge is currently in the action stage of change. As such, his goal is to continue with weight loss  efforts He has agreed  to get back to strict lower carbohydrate, vegetable and lean protein rich diet plan for 2 weeks Steven Hodge has been instructed to work up to a goal of 150 minutes of combined cardio and strengthening exercise per week for weight loss and overall health benefits. We discussed the following Behavioral Modification Strategies today: no skipping meals, increasing lean protein intake, decreasing simple carbohydrates  and work on meal planning and easy cooking plans  Graeson has agreed to follow up with our clinic in 2 weeks. He was informed of the importance of frequent follow up visits to maximize his success with intensive lifestyle modifications for his multiple health conditions.   OBESITY BEHAVIORAL INTERVENTION VISIT  Today's visit was # 16 out of 22.  Starting weight: 272 lbs Starting date: 09/18/16 Today's weight : 257 lbs Today's date: 08/03/2017 Total lbs lost to date: 15 (Patients must lose 7 lbs in the first 6 months to continue with counseling)   ASK: We discussed the diagnosis of obesity with Steven Hodge today and Steven Hodge agreed to give [name] permission to discuss obesity behavioral modification therapy today.  ASSESS: Dawit has the diagnosis of obesity and his BMI today is 39.09 Barbara is in the action stage of change   ADVISE: Adib was educated on the multiple health risks of obesity as well as the benefit of weight loss to improve his health. He was advised of the need for long term treatment and the importance of lifestyle modifications.  AGREE: Multiple dietary modification options and treatment options were discussed and  Najir agreed to the above obesity treatment plan.  I, Doreene Nest, am acting as transcriptionist for Dennard Nip, MD  I have reviewed the above documentation for accuracy and completeness, and I agree with the above. -Dennard Nip, MD

## 2017-08-17 ENCOUNTER — Ambulatory Visit (INDEPENDENT_AMBULATORY_CARE_PROVIDER_SITE_OTHER): Payer: BLUE CROSS/BLUE SHIELD | Admitting: Physician Assistant

## 2017-08-17 VITALS — BP 117/71 | HR 73 | Temp 98.0°F | Ht 68.0 in | Wt 256.0 lb

## 2017-08-17 DIAGNOSIS — E119 Type 2 diabetes mellitus without complications: Secondary | ICD-10-CM | POA: Diagnosis not present

## 2017-08-17 DIAGNOSIS — E559 Vitamin D deficiency, unspecified: Secondary | ICD-10-CM

## 2017-08-17 DIAGNOSIS — Z6839 Body mass index (BMI) 39.0-39.9, adult: Secondary | ICD-10-CM

## 2017-08-17 DIAGNOSIS — I10 Essential (primary) hypertension: Secondary | ICD-10-CM

## 2017-08-17 DIAGNOSIS — Z9189 Other specified personal risk factors, not elsewhere classified: Secondary | ICD-10-CM

## 2017-08-17 MED ORDER — LIRAGLUTIDE 18 MG/3ML ~~LOC~~ SOPN: 0.6000 mg | PEN_INJECTOR | Freq: Every morning | SUBCUTANEOUS | 0 refills | Status: DC

## 2017-08-17 MED ORDER — METFORMIN HCL 500 MG PO TABS: 500.0000 mg | ORAL_TABLET | Freq: Two times a day (BID) | ORAL | 0 refills | Status: DC

## 2017-08-17 MED ORDER — LISINOPRIL 5 MG PO TABS: 5.0000 mg | ORAL_TABLET | Freq: Every day | ORAL | 0 refills | Status: DC

## 2017-08-17 MED ORDER — VITAMIN D (ERGOCALCIFEROL) 1.25 MG (50000 UNIT) PO CAPS: 50000.0000 [IU] | ORAL_CAPSULE | ORAL | 0 refills | Status: DC

## 2017-08-18 NOTE — Progress Notes (Signed)
Office: [phone]  /  Fax: [phone]   HPI:   Chief Complaint: OBESITY Steven Hodge is here to discuss his progress with his obesity treatment plan. He is on the lower carbohydrate, vegetable and lean protein rich diet plan and is following his eating plan approximately 100 % of the time. He states he is walking for 30 minutes 2-3 times per week. Steven Hodge continues to do well with weight loss. He states he continues to enjoy the low carbohydrate meal plan and declines any reintroduction of carborhydrates into his eating. He has noticed less frequent bowel movements with eating less fruit. He was informed of the importance of a nutritionally balanced meals.  His weight is 256 lb (116.1 kg) today and has had a weight loss of 1 pound over a period of 2 weeks since his last visit. He has lost 16 lbs since starting treatment with [name].  Hypertension SABASTIAN Hodge is a 57 y.o. male with hypertension. Lamorris's blood pressure is stable and he denies chest pain or shortness of breath. He is working weight loss to help control his blood pressure with the goal of decreasing his risk of heart attack and stroke. Steven Hodge's blood pressure is currently controlled.  Diabetes II Steven Hodge has a diagnosis of diabetes type II. Steven Hodge states fasting BGs range between 110's and 15's and post prandial range in 110's. He denies hypoglycemia. Last A1c was 6.4 on 06/03/17. He has been working on intensive lifestyle modifications including diet, exercise, and weight loss to help control his blood glucose levels.  At risk for cardiovascular disease Steven Hodge is at a higher than average risk for cardiovascular disease due to obesity, hypertension, and diabetes II. He currently denies any chest pain.  Vitamin D Deficiency Steven Hodge has a diagnosis of vitamin D deficiency. He is currently taking prescription Vit D and denies nausea, vomiting or muscle weakness.  ALLERGIES: No Known Allergies  MEDICATIONS: Current Outpatient Medications on  File Prior to Visit  Medication Sig Dispense Refill  . aspirin 325 MG tablet Take 325 mg by mouth daily.    . blood glucose meter kit and supplies Dispense based on patient and insurance preference. Use up to four times daily as directed. (FOR ICD-9 250.00, 250.01). 1 each 0  . Cinnamon 500 MG TABS Take 1,000 mg by mouth daily.    . fish oil-omega-3 fatty acids 1000 MG capsule Take 2 g by mouth daily.    [name] glucosamine-chondroitin 500-400 MG tablet Take 1 tablet by mouth once.    [name] glucose blood (BAYER CONTOUR NEXT TEST) test strip USE TO CHECK BLOOD SUGAR UP TO 4 TIMES ADAY 100 each 0  . Insulin Pen Needle (SURE COMFORT PEN NEEDLES) 32G X 4 MM MISC USE FOR VICTOZA INJECTIONS 100 each 0  . Multiple Vitamin (MULTIVITAMIN) tablet Take 1 tablet by mouth daily.    . TURMERIC PO Take 1,000 mg by mouth 2 (two) times daily.     No current facility-administered medications on file prior to visit.     PAST MEDICAL HISTORY: Past Medical History:  Diagnosis Date  . Back pain   . Cancer ([place]) 06/09/2006   Basal cell carcinoma scalp;   [name] Diabetes mellitus without complication ([place])   . Hx of blood clots   . Hypertension     PAST SURGICAL HISTORY: Past Surgical History:  Procedure Laterality Date  . basel cell cancer removed  2009  . KNEE SURGERY  2005   ACL repair R    SOCIAL HISTORY:  Social History   Tobacco Use  . Smoking status: Former Research scientist (life sciences)  . Smokeless tobacco: Never Used  Substance Use Topics  . Alcohol use: No  . Drug use: No    FAMILY HISTORY: Family History  Problem Relation Age of Onset  . Diabetes Mother   . Liver disease Mother   . Obesity Mother   . Cancer Father 83       lung cancer  . Diabetes Father   . Cancer Sister 53       lung cancer  . Heart disease Brother 24       cardiac stenting/CAD  . Cancer Brother        skin cancer  . Arthritis Brother     ROS: Review of Systems  Constitutional: Positive for weight loss.  Respiratory: Negative for  shortness of breath.   Cardiovascular: Negative for chest pain.  Gastrointestinal: Negative for nausea and vomiting.  Musculoskeletal:       Negative muscle weakness  Endo/Heme/Allergies:       Negative hypoglycemia    PHYSICAL EXAM: Blood pressure 117/71, pulse 73, temperature 98 F (36.7 C), temperature source Oral, height _0  (1.727 m), weight 256 lb (116.1 kg), SpO2 98 %. Body mass index is 38.92 kg/m. Physical Exam  Constitutional: He is oriented to person, place, and time. He appears well-developed and well-nourished.  Cardiovascular: Normal rate.  Pulmonary/Chest: Effort normal.  Musculoskeletal: Normal range of motion.  Neurological: He is oriented to person, place, and time.  Skin: Skin is warm and dry.  Psychiatric: He has a normal mood and affect. His behavior is normal.  Vitals reviewed.   RECENT LABS AND TESTS: BMET    Component Value Date/Time   NA 143 06/03/2017 1130   K 4.7 06/03/2017 1130   CL 105 06/03/2017 1130   CO2 24 06/03/2017 1130   GLUCOSE 149 (H) 06/03/2017 1130   GLUCOSE 146 (H) 04/22/2016 1349   BUN 16 06/03/2017 1130   CREATININE 0.87 06/03/2017 1130   CREATININE 0.88 04/22/2016 1349   CALCIUM 9.2 06/03/2017 1130   GFRNONAA 98 06/03/2017 1130   GFRAA 113 06/03/2017 1130   Lab Results  Component Value Date   HGBA1C 6.4 (H) 06/03/2017   HGBA1C 6.0 (H) 02/12/2017   HGBA1C 6.7 (H) 09/18/2016   HGBA1C 6.7 (H) 08/20/2016   HGBA1C 6.1 04/22/2016   Lab Results  Component Value Date   INSULIN 30.2 (H) 02/12/2017   INSULIN 32.4 (H) 09/18/2016   CBC    Component Value Date/Time   WBC 8.5 06/03/2017 1130   WBC 9.8 11/25/2016 1119   WBC 8.2 04/22/2016 1349   RBC 5.15 06/03/2017 1130   RBC 4.57 (A) 11/25/2016 1119   RBC 5.24 04/22/2016 1349   HGB 15.6 06/03/2017 1130   HCT 46.1 06/03/2017 1130   PLT 210 06/03/2017 1130   MCV 90 06/03/2017 1130   MCH 30.3 06/03/2017 1130   MCH 31.5 (A) 11/25/2016 1119   MCH 30.9 04/22/2016 1349    MCHC 33.8 06/03/2017 1130   MCHC 36.1 (A) 11/25/2016 1119   MCHC 34.4 04/22/2016 1349   RDW 13.3 06/03/2017 1130   LYMPHSABS 1.9 06/03/2017 1130   MONOABS 656 04/22/2016 1349   EOSABS 0.2 06/03/2017 1130   BASOSABS 0.0 06/03/2017 1130   Iron/TIBC/Ferritin/ %Sat No results found for: IRON, TIBC, FERRITIN, IRONPCTSAT Lipid Panel     Component Value Date/Time   CHOL 189 06/03/2017 1130   TRIG 132 06/03/2017 1130   HDL  47 06/03/2017 1130   CHOLHDL 4.0 06/03/2017 1130   CHOLHDL 4.4 04/22/2016 1349   VLDL 30 04/22/2016 1349   LDLCALC 116 (H) 06/03/2017 1130   Hepatic Function Panel     Component Value Date/Time   PROT 6.9 06/03/2017 1130   ALBUMIN 4.2 06/03/2017 1130   AST 17 06/03/2017 1130   ALT 27 06/03/2017 1130   ALKPHOS 65 06/03/2017 1130   BILITOT 0.3 06/03/2017 1130      Component Value Date/Time   TSH 2.230 09/18/2016 0956   TSH 2.44 04/22/2016 1349   TSH 1.731 03/09/2015 1524  Results for BRYLEN, WAGAR (MRN [id number]) as of 08/18/2017 11:22  Ref. Range 06/03/2017 11:30  Vitamin D, 25-Hydroxy Latest Ref Range: 30.0 - 100.0 ng/mL 37.2    ASSESSMENT AND PLAN: Essential hypertension - Plan: lisinopril (PRINIVIL,ZESTRIL) 5 MG tablet  Type 2 diabetes mellitus without complication, without long-term current use of insulin (HCC) - Plan: liraglutide (VICTOZA) 18 MG/3ML SOPN, metFORMIN (GLUCOPHAGE) 500 MG tablet  Vitamin D deficiency - Plan: Vitamin D, Ergocalciferol, (DRISDOL) 50000 units CAPS capsule  At risk for heart disease  Class 2 severe obesity with serious comorbidity and body mass index (BMI) of 39.0 to 39.9 in adult, unspecified obesity type ([place])  PLAN:  Hypertension We discussed sodium restriction, working on healthy weight loss, and a regular exercise program as the means to achieve improved blood pressure control. Steven Hodge agreed with this plan and agreed to follow up as directed. We will continue to monitor his blood pressure as well as his progress with  the above lifestyle modifications. Apollo agrees to continue taking lisinopril 5 mg qd #30 and we will refill for 1 month and he will watch for signs of hypotension as he continues his lifestyle modifications. Loyd agrees to follow up with our clinic in 4 weeks.  Diabetes II Josiyah has been given extensive diabetes education by myself today including ideal fasting and post-prandial blood glucose readings, individual ideal Hgb A1c goals and hypoglycemia prevention. We discussed the importance of good blood sugar control to decrease the likelihood of diabetic complications such as nephropathy, neuropathy, limb loss, blindness, coronary artery disease, and death. We discussed the importance of intensive lifestyle modification including diet, exercise and weight loss as the first line treatment for diabetes. Ernest agrees to continue Victoza 2 pens (Pt at 0.9 mg) and we will refill for 1 month and he agrees to decrease metformin to 500 mg BID #60 and we will refill for 1 month. Riaz agrees to follow up with our clinic in 4 weeks.   Cardiovascular risk counselling Aquiles was given extended (15 minutes) coronary artery disease prevention counseling today. He is 57 y.o. male and has risk factors for heart disease including obesity, hypertension, and diabetes II. We discussed intensive lifestyle modifications today with an emphasis on specific weight loss instructions and strategies. Pt was also informed of the importance of increasing exercise and decreasing saturated fats to help prevent heart disease.  Vitamin D Deficiency Dellas was informed that low vitamin D levels contributes to fatigue and are associated with obesity, breast, and colon cancer. Elzie agrees to continue taking prescription Vit D _0 ,000 IU every week #4 and we will refill for 1 month. He will follow up for routine testing of vitamin D, at least 2-3 times per year. He was informed of the risk of over-replacement of vitamin D and agrees to not  increase his dose unless he discusses this with [name] first. Betty agrees to follow  up with our clinic in 4 weeks.  Obesity Cledith is currently in the action stage of change. As such, his goal is to continue with weight loss efforts He has agreed to follow a lower carbohydrate, vegetable and lean protein rich diet plan Cheskel has been instructed to work up to a goal of 150 minutes of combined cardio and strengthening exercise per week for weight loss and overall health benefits. We discussed the following Behavioral Modification Strategies today: increasing vegetables and increasing fiber rich foods Jrue was advised on incorporating more carbohydrates such as fruit to make a balanced meal plan, however he declines.  Jamieon has agreed to follow up with our clinic in 4 weeks. He was informed of the importance of frequent follow up visits to maximize his success with intensive lifestyle modifications for his multiple health conditions.   OBESITY BEHAVIORAL INTERVENTION VISIT  Today's visit was # 17 out of 22.  Starting weight: 272 lbs Starting date: 09/18/16 Today's weight : 256 lbs Today's date: 08/17/2017 Total lbs lost to date: 16 (Patients must lose 7 lbs in the first 6 months to continue with counseling)   ASK: We discussed the diagnosis of obesity with [name] today and Steven Hodge agreed to give [name] permission to discuss obesity behavioral modification therapy today.  ASSESS: Yue has the diagnosis of obesity and his BMI today is 38.93 Sukhdeep is in the action stage of change   ADVISE: Yoav was educated on the multiple health risks of obesity as well as the benefit of weight loss to improve his health. He was advised of the need for long term treatment and the importance of lifestyle modifications.  AGREE: Multiple dietary modification options and treatment options were discussed and  Ailton agreed to the above obesity treatment plan.   Wilhemena Durie, am acting as  transcriptionist for Lacy Duverney, PA-C I, Lacy Duverney [facility], have reviewed this note and agree with its content

## 2017-09-14 ENCOUNTER — Ambulatory Visit (INDEPENDENT_AMBULATORY_CARE_PROVIDER_SITE_OTHER): Payer: BLUE CROSS/BLUE SHIELD | Admitting: Physician Assistant

## 2017-09-14 VITALS — BP 115/69 | HR 86 | Temp 98.6°F | Ht 68.0 in | Wt 257.0 lb

## 2017-09-14 DIAGNOSIS — Z9189 Other specified personal risk factors, not elsewhere classified: Secondary | ICD-10-CM | POA: Diagnosis not present

## 2017-09-14 DIAGNOSIS — E559 Vitamin D deficiency, unspecified: Secondary | ICD-10-CM

## 2017-09-14 DIAGNOSIS — E1165 Type 2 diabetes mellitus with hyperglycemia: Secondary | ICD-10-CM | POA: Diagnosis not present

## 2017-09-14 DIAGNOSIS — Z6839 Body mass index (BMI) 39.0-39.9, adult: Secondary | ICD-10-CM

## 2017-09-14 MED ORDER — GLUCOSE BLOOD VI STRP: ORAL_STRIP | 0 refills | Status: DC

## 2017-09-14 MED ORDER — VITAMIN D (ERGOCALCIFEROL) 1.25 MG (50000 UNIT) PO CAPS: 50000.0000 [IU] | ORAL_CAPSULE | ORAL | 0 refills | Status: DC

## 2017-09-15 NOTE — Progress Notes (Addendum)
Office: [phone]  /  Fax: [phone]   HPI:   Chief Complaint: OBESITY Steven Hodge is here to discuss his progress with his obesity treatment plan. He is on the Category 3 plan and is following his eating plan approximately 100 % of the time. He states he is exercising 30 minutes 1-2 times per week. [name] continues to be mindful of his eating and makes smarter food choices. He would like to stay on Category 3.  His weight is 257 lb (116.6 kg) today and has gained 1 pound since his last visit. He has lost 15 lbs since starting treatment with [name].  Vitamin D Deficiency [name] has a diagnosis of vitamin D deficiency. He is currently taking prescription Vit D and denies nausea, vomiting or muscle weakness.  At risk for osteopenia and osteoporosis [name] is at higher risk of osteopenia and osteoporosis due to vitamin D deficiency.   Diabetes II [name] has a diagnosis of diabetes type II. [name] states fasting BGs range between 110's and 150's and post prandial range between 100's and 110's. He denies any hypoglycemic episodes. Last A1c was 6.4 on 06/03/17. He has been working on intensive lifestyle modifications including diet, exercise, and weight loss to help control his blood glucose levels.  ALLERGIES: No Known Allergies  MEDICATIONS: Current Outpatient Medications on File Prior to Visit  Medication Sig Dispense Refill  . aspirin 325 MG tablet Take 325 mg by mouth daily.    . blood glucose meter kit and supplies Dispense based on patient and insurance preference. Use up to four times daily as directed. (FOR ICD-9 250.00, 250.01). 1 each 0  . Cinnamon 500 MG TABS Take 1,000 mg by mouth daily.    . fish oil-omega-3 fatty acids 1000 MG capsule Take 2 g by mouth daily.    [name] glucosamine-chondroitin 500-400 MG tablet Take 1 tablet by mouth once.    . Insulin Pen Needle (SURE COMFORT PEN NEEDLES) 32G X 4 MM MISC USE FOR VICTOZA INJECTIONS 100 each 0  . liraglutide (VICTOZA) 18 MG/3ML SOPN  Inject 0.1 mLs (0.6 mg total) into the skin every morning. 1 pen 0  . lisinopril (PRINIVIL,ZESTRIL) 5 MG tablet Take 1 tablet (5 mg total) by mouth daily. 30 tablet 0  . metFORMIN (GLUCOPHAGE) 500 MG tablet Take 1 tablet (500 mg total) by mouth 2 (two) times daily with a meal. (Patient taking differently: Take 500 mg by mouth daily with breakfast. ) 60 tablet 0  . Multiple Vitamin (MULTIVITAMIN) tablet Take 1 tablet by mouth daily.    . TURMERIC PO Take 1,000 mg by mouth 2 (two) times daily.     No current facility-administered medications on file prior to visit.     PAST MEDICAL HISTORY: Past Medical History:  Diagnosis Date  . Back pain   . Cancer ([place]) 06/09/2006   Basal cell carcinoma scalp;   [name] Diabetes mellitus without complication ([place])   . Hx of blood clots   . Hypertension     PAST SURGICAL HISTORY: Past Surgical History:  Procedure Laterality Date  . basel cell cancer removed  2009  . KNEE SURGERY  2005   ACL repair R    SOCIAL HISTORY: Social History   Tobacco Use  . Smoking status: Former Research scientist (life sciences)  . Smokeless tobacco: Never Used  Substance Use Topics  . Alcohol use: No  . Drug use: No    FAMILY HISTORY: Family History  Problem Relation Age of Onset  . Diabetes Mother   .  Liver disease Mother   . Obesity Mother   . Cancer Father 65       lung cancer  . Diabetes Father   . Cancer Sister 9       lung cancer  . Heart disease Brother 102       cardiac stenting/CAD  . Cancer Brother        skin cancer  . Arthritis Brother     ROS: Review of Systems  Constitutional: Negative for weight loss.  Gastrointestinal: Negative for nausea and vomiting.  Musculoskeletal:       Negative muscle weakness  Endo/Heme/Allergies:       Negative hypoglycemia    PHYSICAL EXAM: Blood pressure 115/69, pulse 86, temperature 98.6 F (37 C), temperature source Oral, height '5\' 8"'  (1.727 m), weight 257 lb (116.6 kg), SpO2 96 %. Body mass index is 39.08 kg/m. Physical  Exam  Constitutional: He is oriented to person, place, and time. He appears well-developed and well-nourished.  Cardiovascular: Normal rate.  Pulmonary/Chest: Effort normal.  Musculoskeletal: Normal range of motion.  Neurological: He is oriented to person, place, and time.  Skin: Skin is warm and dry.  Psychiatric: He has a normal mood and affect. His behavior is normal.  Vitals reviewed.   RECENT LABS AND TESTS: BMET    Component Value Date/Time   NA 143 06/03/2017 1130   K 4.7 06/03/2017 1130   CL 105 06/03/2017 1130   CO2 24 06/03/2017 1130   GLUCOSE 149 (H) 06/03/2017 1130   GLUCOSE 146 (H) 04/22/2016 1349   BUN 16 06/03/2017 1130   CREATININE 0.87 06/03/2017 1130   CREATININE 0.88 04/22/2016 1349   CALCIUM 9.2 06/03/2017 1130   GFRNONAA 98 06/03/2017 1130   GFRAA 113 06/03/2017 1130   Lab Results  Component Value Date   HGBA1C 6.4 (H) 06/03/2017   HGBA1C 6.0 (H) 02/12/2017   HGBA1C 6.7 (H) 09/18/2016   HGBA1C 6.7 (H) 08/20/2016   HGBA1C 6.1 04/22/2016   Lab Results  Component Value Date   INSULIN 30.2 (H) 02/12/2017   INSULIN 32.4 (H) 09/18/2016   CBC    Component Value Date/Time   WBC 8.5 06/03/2017 1130   WBC 9.8 11/25/2016 1119   WBC 8.2 04/22/2016 1349   RBC 5.15 06/03/2017 1130   RBC 4.57 (A) 11/25/2016 1119   RBC 5.24 04/22/2016 1349   HGB 15.6 06/03/2017 1130   HCT 46.1 06/03/2017 1130   PLT 210 06/03/2017 1130   MCV 90 06/03/2017 1130   MCH 30.3 06/03/2017 1130   MCH 31.5 (A) 11/25/2016 1119   MCH 30.9 04/22/2016 1349   MCHC 33.8 06/03/2017 1130   MCHC 36.1 (A) 11/25/2016 1119   MCHC 34.4 04/22/2016 1349   RDW 13.3 06/03/2017 1130   LYMPHSABS 1.9 06/03/2017 1130   MONOABS 656 04/22/2016 1349   EOSABS 0.2 06/03/2017 1130   BASOSABS 0.0 06/03/2017 1130   Iron/TIBC/Ferritin/ %Sat No results found for: IRON, TIBC, FERRITIN, IRONPCTSAT Lipid Panel     Component Value Date/Time   CHOL 189 06/03/2017 1130   TRIG 132 06/03/2017 1130   HDL  47 06/03/2017 1130   CHOLHDL 4.0 06/03/2017 1130   CHOLHDL 4.4 04/22/2016 1349   VLDL 30 04/22/2016 1349   LDLCALC 116 (H) 06/03/2017 1130   Hepatic Function Panel     Component Value Date/Time   PROT 6.9 06/03/2017 1130   ALBUMIN 4.2 06/03/2017 1130   AST 17 06/03/2017 1130   ALT 27 06/03/2017 1130  ALKPHOS 65 06/03/2017 1130   BILITOT 0.3 06/03/2017 1130      Component Value Date/Time   TSH 2.230 09/18/2016 0956   TSH 2.44 04/22/2016 1349   TSH 1.731 03/09/2015 1524    ASSESSMENT AND PLAN: Vitamin D deficiency - Plan: Vitamin D, Ergocalciferol, (DRISDOL) 50000 units CAPS capsule  Type 2 diabetes mellitus with hyperglycemia, without long-term current use of insulin (HCC) - Plan: glucose blood (BAYER CONTOUR NEXT TEST) test strip  At risk for osteoporosis  Class 2 severe obesity with serious comorbidity and body mass index (BMI) of 39.0 to 39.9 in adult, unspecified obesity type ([place])  PLAN:  Vitamin D Deficiency Elwood was informed that low vitamin D levels contributes to fatigue and are associated with obesity, breast, and colon cancer. Beatriz agrees to continue taking prescription Vit D '@50' ,000 IU every week #4 and we will refill for 1 month. He will follow up for routine testing of vitamin D, at least 2-3 times per year. He was informed of the risk of over-replacement of vitamin D and agrees to not increase his dose unless he discusses this with [name] first. Xai agrees to follow up with our clinic in 4 weeks.  At risk for osteopenia and osteoporosis Theoren is at risk for osteopenia and osteoporsis due to his vitamin D deficiency. He was encouraged to take his vitamin D and follow his higher calcium diet and increase strengthening exercise to help strengthen his bones and decrease his risk of osteopenia and osteoporosis.  Diabetes II [name] has been given extensive diabetes education by myself today including ideal fasting and post-prandial blood glucose readings, individual  ideal Hgb A1c goals and hypoglycemia prevention. We discussed the importance of good blood sugar control to decrease the likelihood of diabetic complications such as nephropathy, neuropathy, limb loss, blindness, coronary artery disease, and death. We discussed the importance of intensive lifestyle modification including diet, exercise and weight loss as the first line treatment for diabetes. Jencarlo agrees to increase Victoza to 1.47m daily, and to decrease metformin to 500 mg once daily (no refill needed), and we will refill test strips for 1 month. ASamagrees to follow up with our clinic in 4 weeks.  Obesity AIsamis currently in the action stage of change. As such, his goal is to continue with weight loss efforts He has agreed to follow the Category 3 plan ABronhas been instructed to work up to a goal of 150 minutes of combined cardio and strengthening exercise per week for weight loss and overall health benefits. We discussed the following Behavioral Modification Strategies today: increasing lean protein intake and work on meal planning and easy cooking plans   ARodelhas agreed to follow up with our clinic in 4 weeks. He was informed of the importance of frequent follow up visits to maximize his success with intensive lifestyle modifications for his multiple health conditions.   OBESITY BEHAVIORAL INTERVENTION VISIT  Today's visit was # 18 out of 22.  Starting weight: 272 lbs Starting date: 09/18/16 Today's weight : 257 lbs  Today's date: 09/14/2017 Total lbs lost to date: 15 (Patients must lose 7 lbs in the first 6 months to continue with counseling)   ASK: We discussed the diagnosis of obesity with AJulious Okatoday and AZenia Residesagreed to give uKoreapermission to discuss obesity behavioral modification therapy today.  ASSESS: AMasaohas the diagnosis of obesity and his BMI today is 39.09 ADavonteis in the action stage of change   ADVISE: AZenia Resides  was educated on the multiple health risks  of obesity as well as the benefit of weight loss to improve his health. He was advised of the need for long term treatment and the importance of lifestyle modifications.  AGREE: Multiple dietary modification options and treatment options were discussed and  Deantre agreed to the above obesity treatment plan.   Wilhemena Durie, am acting as transcriptionist for Lacy Duverney, PA-C I, Lacy Duverney [facility], have reviewed this note and agree with its content

## 2017-09-21 ENCOUNTER — Telehealth (INDEPENDENT_AMBULATORY_CARE_PROVIDER_SITE_OTHER): Payer: Self-pay | Admitting: Family Medicine

## 2017-09-21 NOTE — Telephone Encounter (Signed)
Patient called requested an refill on Victoza.  He stated that his dosage was increased and now needs more.  He would like this sent to [facility] pharmacy.  [phone]

## 2017-09-22 NOTE — Telephone Encounter (Signed)
Steven Hodge with [facility] called to check on the refill on Victoza.  She stated that she left a message yesterday and the patient also called.

## 2017-09-24 ENCOUNTER — Other Ambulatory Visit (INDEPENDENT_AMBULATORY_CARE_PROVIDER_SITE_OTHER): Payer: Self-pay

## 2017-09-24 DIAGNOSIS — E119 Type 2 diabetes mellitus without complications: Secondary | ICD-10-CM

## 2017-09-24 MED ORDER — LIRAGLUTIDE 18 MG/3ML ~~LOC~~ SOPN: 1.2000 mg | PEN_INJECTOR | Freq: Every morning | SUBCUTANEOUS | 0 refills | Status: DC

## 2017-09-24 NOTE — Telephone Encounter (Signed)
Prescription sent to the pharmacy. Khyson Sebesta, CMA

## 2017-09-28 ENCOUNTER — Other Ambulatory Visit: Payer: Self-pay

## 2017-09-28 ENCOUNTER — Encounter: Payer: Self-pay | Admitting: Family Medicine

## 2017-09-28 ENCOUNTER — Ambulatory Visit: Payer: BLUE CROSS/BLUE SHIELD | Admitting: Family Medicine

## 2017-09-28 VITALS — BP 132/80 | HR 99 | Temp 98.9°F | Resp 16 | Ht 68.0 in | Wt 263.6 lb

## 2017-09-28 DIAGNOSIS — M25462 Effusion, left knee: Secondary | ICD-10-CM | POA: Diagnosis not present

## 2017-09-28 DIAGNOSIS — M1A9XX1 Chronic gout, unspecified, with tophus (tophi): Secondary | ICD-10-CM

## 2017-09-28 DIAGNOSIS — M25562 Pain in left knee: Secondary | ICD-10-CM | POA: Diagnosis not present

## 2017-09-28 NOTE — Patient Instructions (Addendum)
Gout Gout is painful swelling that can occur in some of your joints. Gout is a type of arthritis. This condition is caused by having too much uric acid in your body. Uric acid is a chemical that forms when your body breaks down substances called purines. Purines are important for building body proteins. When your body has too much uric acid, sharp crystals can form and build up inside your joints. This causes pain and swelling. Gout attacks can happen quickly and be very painful (acute gout). Over time, the attacks can affect more joints and become more frequent (chronic gout). Gout can also cause uric acid to build up under your skin and inside your kidneys. What are the causes? This condition is caused by too much uric acid in your blood. This can occur because:  Your kidneys do not remove enough uric acid from your blood. This is the most common cause.  Your body makes too much uric acid. This can occur with some cancers and cancer treatments. It can also occur if your body is breaking down too many red blood cells (hemolytic anemia).  You eat too many foods that are high in purines. These foods include organ meats and some seafood. Alcohol, especially beer, is also high in purines.  A gout attack may be triggered by trauma or stress. What increases the risk? This condition is more likely to develop in people who:  Have a family history of gout.  Are male and middle-aged.  Are male and have gone through menopause.  Are obese.  Frequently drink alcohol, especially beer.  Are dehydrated.  Lose weight too quickly.  Have an organ transplant.  Have lead poisoning.  Take certain medicines, including aspirin, cyclosporine, diuretics, levodopa, and niacin.  Have kidney disease or psoriasis.  What are the signs or symptoms? An attack of acute gout happens quickly. It usually occurs in just one joint. The most common place is the big toe. Attacks often start at night. Other joints  that may be affected include joints of the feet, ankle, knee, fingers, wrist, or elbow. Symptoms may include:  Severe pain.  Warmth.  Swelling.  Stiffness.  Tenderness. The affected joint may be very painful to touch.  Shiny, red, or purple skin.  Chills and fever.  Chronic gout may cause symptoms more frequently. More joints may be involved. You may also have white or yellow lumps (tophi) on your hands or feet or in other areas near your joints. How is this diagnosed? This condition is diagnosed based on your symptoms, medical history, and physical exam. You may have tests, such as:  Blood tests to measure uric acid levels.  Removal of joint fluid with a needle (aspiration) to look for uric acid crystals.  X-rays to look for joint damage.  How is this treated? Treatment for this condition has two phases: treating an acute attack and preventing future attacks. Acute gout treatment may include medicines to reduce pain and swelling, including:  NSAIDs.  Steroids. These are strong anti-inflammatory medicines that can be taken by mouth (orally) or injected into a joint.  Colchicine. This medicine relieves pain and swelling when it is taken soon after an attack. It can be given orally or through an IV tube.  Preventive treatment may include:  Daily use of smaller doses of NSAIDs or colchicine.  Use of a medicine that reduces uric acid levels in your blood.  Changes to your diet. You may need to see a specialist about healthy eating (dietitian).  Follow these instructions at home: During a Gout Attack  If directed, apply ice to the affected area: ? Put ice in a plastic bag. ? Place a towel between your skin and the bag. ? Leave the ice on for 20 minutes, 2-3 times a day.  Rest the joint as much as possible. If the affected joint is in your leg, you may be given crutches to use.  Raise (elevate) the affected joint above the level of your heart as often as  possible.  Drink enough fluids to keep your urine clear or pale yellow.  Take over-the-counter and prescription medicines only as told by your health care provider.  Do not drive or operate heavy machinery while taking prescription pain medicine.  Follow instructions from your health care provider about eating or drinking restrictions.  Return to your normal activities as told by your health care provider. Ask your health care provider what activities are safe for you. Avoiding Future Gout Attacks  Follow a low-purine diet as told by your dietitian or health care provider. Avoid foods and drinks that are high in purines, including liver, kidney, anchovies, asparagus, herring, mushrooms, mussels, and beer.  Limit alcohol intake to no more than 1 drink a day for nonpregnant women and 2 drinks a day for men. One drink equals 12 oz of beer, 5 oz of wine, or 1 oz of hard liquor.  Maintain a healthy weight or lose weight if you are overweight. If you want to lose weight, talk with your health care provider. It is important that you do not lose weight too quickly.  Start or maintain an exercise program as told by your health care provider.  Drink enough fluids to keep your urine clear or pale yellow.  Take over-the-counter and prescription medicines only as told by your health care provider.  Keep all follow-up visits as told by your health care provider. This is important. Contact a health care provider if:  You have another gout attack.  You continue to have symptoms of a gout attack after10 days of treatment.  You have side effects from your medicines.  You have chills or a fever.  You have burning pain when you urinate.  You have pain in your lower back or belly. Get help right away if:  You have severe or uncontrolled pain.  You cannot urinate. This information is not intended to replace advice given to you by your health care provider. Make sure you discuss any questions  you have with your health care provider. Document Released: 05/23/2000 Document Revised: 11/01/2015 Document Reviewed: 03/08/2015 Elsevier Interactive Patient Education  2018 [employer].     IF you received an x-ray today, you will receive an invoice from [facility]. Please contact [facility] at [phone] with questions or concerns regarding your invoice.   IF you received labwork today, you will receive an invoice from [facility]. Please contact [facility] at [phone] with questions or concerns regarding your invoice.   Our billing staff will not be able to assist you with questions regarding bills from these companies.  You will be contacted with the lab results as soon as they are available. The fastest way to get your results is to activate your My Chart account. Instructions are located on the last page of this paperwork. If you have not heard from [name] regarding the results in 2 weeks, please contact this office.

## 2017-09-28 NOTE — Progress Notes (Signed)
Subjective:    Patient ID: Steven Hodge, male    DOB: 06-01-1961, 57 y.o.   MRN: [id number]  09/28/2017  Gout (swollen since Thursday , white discharge and clear fluid )    HPI This 57 y.o. male presents for evaluation of L knee swelling with white discharge and clear fluid from localized area of swelling.  Denies fever, chills, sweats.  Has a chronic localized swelling along left knee.  Has history of previous acute episodes of swelling expression of white discharge.  Previous trauma to this area with suturing.  Also has a history of gout.  Concern of etiology and appropriate treatment.  Does work on knees frequently suffers chronic trauma to knees.  No pain with movement of knee joint.  Redness of skin of anterior knee is appreciated by patient.     Immunization History  Administered Date(s) Administered  . Hepatitis A, Adult 12/02/2016, 06/03/2017  . Hepatitis B, adult 04/22/2016, 08/20/2016, 12/02/2016  . Influenza,inj,Quad PF,6+ Mos 03/09/2015, 04/22/2016, 06/03/2017  . Pneumococcal Polysaccharide-23 03/09/2015  . Tdap 06/09/2008    Review of Systems  Constitutional: Negative for chills, diaphoresis, fatigue and fever.  Musculoskeletal: Negative for arthralgias, gait problem, joint swelling and myalgias.  Skin: Positive for color change and wound. Negative for pallor and rash.    Past Medical History:  Diagnosis Date  . Back pain   . Cancer ([place]) 06/09/2006   Basal cell carcinoma scalp;   [name] Diabetes mellitus without complication ([place])   . Hx of blood clots   . Hypertension    Past Surgical History:  Procedure Laterality Date  . basel cell cancer removed  2009  . KNEE SURGERY  2005   ACL repair R   No Known Allergies Current Outpatient Medications on File Prior to Visit  Medication Sig Dispense Refill  . aspirin 325 MG tablet Take 325 mg by mouth daily.    . blood glucose meter kit and supplies Dispense based on patient and insurance preference. Use up to four  times daily as directed. (FOR ICD-9 250.00, 250.01). 1 each 0  . Cinnamon 500 MG TABS Take 1,000 mg by mouth daily.    . fish oil-omega-3 fatty acids 1000 MG capsule Take 2 g by mouth daily.    [name] glucosamine-chondroitin 500-400 MG tablet Take 1 tablet by mouth once.    . Multiple Vitamin (MULTIVITAMIN) tablet Take 1 tablet by mouth daily.    . TURMERIC PO Take 1,000 mg by mouth 2 (two) times daily.     No current facility-administered medications on file prior to visit.    Social History   Socioeconomic History  . Marital status: Married    Spouse name: Not on file  . Number of children: Not on file  . Years of education: Not on file  . Highest education level: Not on file  Occupational History  . Occupation: Medical illustrator, Dealer  Social Needs  . Financial resource strain: Not on file  . Food insecurity:    Worry: Not on file    Inability: Not on file  . Transportation needs:    Medical: Not on file    Non-medical: Not on file  Tobacco Use  . Smoking status: Former Research scientist (life sciences)  . Smokeless tobacco: Never Used  Substance and Sexual Activity  . Alcohol use: No  . Drug use: No  . Sexual activity: Yes  Lifestyle  . Physical activity:    Days per week: Not on file    Minutes per  session: Not on file  . Stress: Not on file  Relationships  . Social connections:    Talks on phone: Not on file    Gets together: Not on file    Attends religious service: Not on file    Active member of club or organization: Not on file    Attends meetings of clubs or organizations: Not on file    Relationship status: Not on file  . Intimate partner violence:    Fear of current or ex partner: Not on file    Emotionally abused: Not on file    Physically abused: Not on file    Forced sexual activity: Not on file  Other Topics Concern  . 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  Walking once per week in 2018.         Family History  Problem Relation Age of Onset  . Diabetes Mother   . Liver disease Mother   . Obesity Mother   . Cancer Father 55       lung cancer  . Diabetes Father   . Cancer Sister 81       lung cancer  . Heart disease Brother 28       cardiac stenting/CAD  . Cancer Brother        skin cancer  . Arthritis Brother        Objective:    BP 132/80   Pulse 99   Temp 98.9 F (37.2 C)   Resp 16   Ht _0  (1.727 m)   Wt 263 lb 9.6 oz (119.6 kg)   SpO2 96%   BMI 40.08 kg/m  Physical Exam  Constitutional: He is oriented to person, place, and time. He appears well-developed and well-nourished. No distress.  HENT:  Head: Normocephalic and atraumatic.  Eyes: Pupils are equal, round, and reactive to light. Conjunctivae and EOM are normal.  Neck: Normal range of motion. Neck supple. Carotid bruit is not present. No thyromegaly present.  Cardiovascular: Normal rate, regular rhythm, normal heart sounds and intact distal pulses. Exam reveals no gallop and no friction rub.  No murmur heard. Pulmonary/Chest: Effort normal and breath sounds normal. He has no wheezes. He has no rales.  Musculoskeletal:       Left knee: He exhibits erythema. He exhibits normal range of motion, no swelling, no effusion, no ecchymosis, no deformity, no laceration, normal patellar mobility, no bony tenderness and normal meniscus. No tenderness found. No patellar tendon tenderness noted.       Legs: 2cm diameter localized swelling of subcutaneous tissue of L anterior knee.  +TTP; +warmth.  Scant amount of white debris with clear fluid expressed from wound.  No intra-articular involvement.  Full range of motion of left knee without pain, swelling, limitation.  Lymphadenopathy:    He has no cervical adenopathy.    Neurological: He is alert and oriented to person, place, and time. No cranial nerve deficit.  Skin: Skin is warm and dry. No rash noted. He is not diaphoretic.  Psychiatric:  He has a normal mood and affect. His behavior is normal.  Nursing note and vitals reviewed.  No results found.        Assessment & Plan:   1. Pain and swelling of left knee   2. Tophi gouty     New onset localized swelling of subcutaneous tissue of left anterior knee.  Chronic localized swelling at this site.  Consistent with gouty tophi.  Wound culture obtained of white debris and clear fluid.  Attempts of obtaining fluid analysis yet small sample size.  Obtain uric acid level as well.  Will defer treatment until results return.  Likely will warrant allopurinol therapy for suppression and limitation of further tophi development.  Patient reports symptoms much improved at this time.  No concerns of active infection    Orders Placed This Encounter  Procedures  . WOUND CULTURE    Order Specific Question:   Source    Answer:   l KNEE TOPHI WITH INFECTION  . Synovial fluid, cell count  . CBC with Differential/Platelet  . Uric acid  . Comprehensive metabolic panel   No orders of the defined types were placed in this encounter.   No follow-ups on file.   Kristi Elayne Guerin, M.D. [facility] [address] [place], [place]  [address] [phone] phone [phone] fax

## 2017-09-29 LAB — CBC WITH DIFFERENTIAL/PLATELET
Basophils Absolute: 0 10*3/uL (ref 0.0–0.2)
Basos: 0 %
EOS (ABSOLUTE): 0.2 10*3/uL (ref 0.0–0.4)
Eos: 2 %
Hematocrit: 43.2 % (ref 37.5–51.0)
Hemoglobin: 15.1 g/dL (ref 13.0–17.7)
Immature Grans (Abs): 0 10*3/uL (ref 0.0–0.1)
Immature Granulocytes: 0 %
Lymphocytes Absolute: 2.1 10*3/uL (ref 0.7–3.1)
Lymphs: 22 %
MCH: 29.5 pg (ref 26.6–33.0)
MCHC: 35 g/dL (ref 31.5–35.7)
MCV: 84 fL (ref 79–97)
Monocytes Absolute: 0.6 10*3/uL (ref 0.1–0.9)
Monocytes: 7 %
Neutrophils Absolute: 6.8 10*3/uL (ref 1.4–7.0)
Neutrophils: 69 %
Platelets: 254 10*3/uL (ref 150–379)
RBC: 5.12 x10E6/uL (ref 4.14–5.80)
RDW: 13.6 % (ref 12.3–15.4)
WBC: 9.8 10*3/uL (ref 3.4–10.8)

## 2017-09-29 LAB — COMPREHENSIVE METABOLIC PANEL
ALT: 20 IU/L (ref 0–44)
AST: 13 IU/L (ref 0–40)
Albumin/Globulin Ratio: 1.4 (ref 1.2–2.2)
Albumin: 4.1 g/dL (ref 3.5–5.5)
Alkaline Phosphatase: 67 IU/L (ref 39–117)
BUN/Creatinine Ratio: 17 (ref 9–20)
BUN: 18 mg/dL (ref 6–24)
Bilirubin Total: <0.2 mg/dL (ref 0.0–1.2)
CO2: 21 mmol/L (ref 20–29)
Calcium: 9.7 mg/dL (ref 8.7–10.2)
Chloride: 103 mmol/L (ref 96–106)
Creatinine, Ser: 1.04 mg/dL (ref 0.76–1.27)
GFR calc Af Amer: 94 mL/min/{1.73_m2} (ref >59)
GFR calc non Af Amer: 81 mL/min/{1.73_m2} (ref >59)
Globulin, Total: 3 g/dL (ref 1.5–4.5)
Glucose: 126 mg/dL — ABNORMAL HIGH (ref 65–99)
Potassium: 4.6 mmol/L (ref 3.5–5.2)
Sodium: 140 mmol/L (ref 134–144)
Total Protein: 7.1 g/dL (ref 6.0–8.5)

## 2017-09-29 LAB — URIC ACID: Uric Acid: 7.3 mg/dL (ref 3.7–8.6)

## 2017-09-29 LAB — SYNOVIAL FLUID, CELL COUNT

## 2017-09-30 LAB — WOUND CULTURE

## 2017-10-02 ENCOUNTER — Encounter: Payer: Self-pay | Admitting: Family Medicine

## 2017-10-05 ENCOUNTER — Encounter: Payer: Self-pay | Admitting: Family Medicine

## 2017-10-06 MED ORDER — ALLOPURINOL 100 MG PO TABS: 100.0000 mg | ORAL_TABLET | Freq: Every day | ORAL | 1 refills | Status: DC

## 2017-10-06 MED ORDER — DOXYCYCLINE HYCLATE 100 MG PO TABS: 100.0000 mg | ORAL_TABLET | Freq: Two times a day (BID) | ORAL | 0 refills | Status: DC

## 2017-10-06 NOTE — Addendum Note (Signed)
Addended by: Wardell Honour on: 10/06/2017 06:00 PM   Modules accepted: Orders

## 2017-10-12 ENCOUNTER — Encounter (INDEPENDENT_AMBULATORY_CARE_PROVIDER_SITE_OTHER): Payer: Self-pay | Admitting: Physician Assistant

## 2017-10-12 ENCOUNTER — Ambulatory Visit (INDEPENDENT_AMBULATORY_CARE_PROVIDER_SITE_OTHER): Payer: BLUE CROSS/BLUE SHIELD | Admitting: Physician Assistant

## 2017-10-12 VITALS — BP 124/77 | HR 74 | Temp 97.9°F | Ht 68.0 in | Wt 256.0 lb

## 2017-10-12 DIAGNOSIS — E119 Type 2 diabetes mellitus without complications: Secondary | ICD-10-CM

## 2017-10-12 DIAGNOSIS — Z9189 Other specified personal risk factors, not elsewhere classified: Secondary | ICD-10-CM

## 2017-10-12 DIAGNOSIS — Z6839 Body mass index (BMI) 39.0-39.9, adult: Secondary | ICD-10-CM | POA: Diagnosis not present

## 2017-10-12 DIAGNOSIS — E559 Vitamin D deficiency, unspecified: Secondary | ICD-10-CM

## 2017-10-12 DIAGNOSIS — I10 Essential (primary) hypertension: Secondary | ICD-10-CM | POA: Diagnosis not present

## 2017-10-12 MED ORDER — LIRAGLUTIDE 18 MG/3ML ~~LOC~~ SOPN
1.5000 mg | PEN_INJECTOR | Freq: Every morning | SUBCUTANEOUS | 0 refills | Status: DC
Start: 2017-10-12 — End: 2017-11-05

## 2017-10-12 MED ORDER — INSULIN PEN NEEDLE 32G X 4 MM MISC: 0 refills | Status: DC

## 2017-10-12 MED ORDER — VITAMIN D (ERGOCALCIFEROL) 1.25 MG (50000 UNIT) PO CAPS: 50000.0000 [IU] | ORAL_CAPSULE | ORAL | 0 refills | Status: DC

## 2017-10-12 MED ORDER — LISINOPRIL 5 MG PO TABS: 5.0000 mg | ORAL_TABLET | Freq: Every day | ORAL | 0 refills | Status: DC

## 2017-10-13 LAB — COMPREHENSIVE METABOLIC PANEL
ALT: 20 IU/L (ref 0–44)
AST: 13 IU/L (ref 0–40)
Albumin/Globulin Ratio: 1.5 (ref 1.2–2.2)
Albumin: 3.8 g/dL (ref 3.5–5.5)
Alkaline Phosphatase: 60 IU/L (ref 39–117)
BUN/Creatinine Ratio: 23 — ABNORMAL HIGH (ref 9–20)
BUN: 18 mg/dL (ref 6–24)
Bilirubin Total: <0.2 mg/dL (ref 0.0–1.2)
CO2: 19 mmol/L — ABNORMAL LOW (ref 20–29)
Calcium: 9.1 mg/dL (ref 8.7–10.2)
Chloride: 107 mmol/L — ABNORMAL HIGH (ref 96–106)
Creatinine, Ser: 0.8 mg/dL (ref 0.76–1.27)
GFR calc Af Amer: 117 mL/min/{1.73_m2} (ref >59)
GFR calc non Af Amer: 101 mL/min/{1.73_m2} (ref >59)
Globulin, Total: 2.6 g/dL (ref 1.5–4.5)
Glucose: 145 mg/dL — ABNORMAL HIGH (ref 65–99)
Potassium: 5 mmol/L (ref 3.5–5.2)
Sodium: 143 mmol/L (ref 134–144)
Total Protein: 6.4 g/dL (ref 6.0–8.5)

## 2017-10-13 LAB — HEMOGLOBIN A1C
Est. average glucose Bld gHb Est-mCnc: 128 mg/dL
Hgb A1c MFr Bld: 6.1 % — ABNORMAL HIGH (ref 4.8–5.6)

## 2017-10-13 LAB — INSULIN, RANDOM: INSULIN: 31.8 u[IU]/mL — ABNORMAL HIGH (ref 2.6–24.9)

## 2017-10-13 LAB — LIPID PANEL WITH LDL/HDL RATIO
Cholesterol, Total: 184 mg/dL (ref 100–199)
HDL: 46 mg/dL (ref >39)
LDL Calculated: 124 mg/dL — ABNORMAL HIGH (ref 0–99)
LDl/HDL Ratio: 2.7 ratio (ref 0.0–3.6)
Triglycerides: 71 mg/dL (ref 0–149)
VLDL Cholesterol Cal: 14 mg/dL (ref 5–40)

## 2017-10-13 LAB — VITAMIN D 25 HYDROXY (VIT D DEFICIENCY, FRACTURES): Vit D, 25-Hydroxy: 40.5 ng/mL (ref 30.0–100.0)

## 2017-10-13 NOTE — Progress Notes (Signed)
Office: [phone]  /  Fax: [phone]   HPI:   Chief Complaint: OBESITY Steven Hodge is here to discuss his progress with his obesity treatment plan. He is on the Category 3 plan and is following his eating plan approximately 100 % of the time. He states he is walking for 30 minutes 2 times per week. Steven Hodge continues to do well with weight loss. He is now incorporating more bread into his eating. He continues to be mindful of his eating and eats lean protein.  His weight is 256 lb (116.1 kg) today and has had a weight loss of 1 pound over a period of 4 weeks since his last visit. He has lost 16 lbs since starting treatment with Steven Hodge.  Diabetes II Steven Hodge has a diagnosis of diabetes type II. [name] states fasting BGs range between 110's and 150's and he denies any hypoglycemic episodes. He is on Victoza at 1.2 mg and has decreased metformin to one pill daily and states this has improved his bowel movements. He is to increase Victoza to 1.5 mg. Last A1c was 6.4 on 06/03/17. He has been working on intensive lifestyle modifications including diet, exercise, and weight loss to help control his blood glucose levels.  Vitamin D Deficiency Steven Hodge has a diagnosis of vitamin D deficiency. He is currently taking prescription Vit D and denies nausea, vomiting or muscle weakness.  Hypertension DUSTY RACZKOWSKI is a 57 y.o. male with hypertension. Tavis's blood pressure is stable and he denies chest pain or shortness of breath. He is working weight loss to help control his blood pressure with the goal of decreasing his risk of heart attack and stroke. Constance's blood pressure is currently controlled.  At risk for cardiovascular disease [name] is at a higher than average risk for cardiovascular disease due to obesity and hypertension. He currently denies any chest pain.  ALLERGIES: No Known Allergies  MEDICATIONS: Current Outpatient Medications on File Prior to Visit  Medication Sig Dispense Refill  . allopurinol  (ZYLOPRIM) 100 MG tablet Take 1 tablet (100 mg total) by mouth daily. 30 tablet 1  . aspirin 325 MG tablet Take 325 mg by mouth daily.    . blood glucose meter kit and supplies Dispense based on patient and insurance preference. Use up to four times daily as directed. (FOR ICD-9 250.00, 250.01). 1 each 0  . Cinnamon 500 MG TABS Take 1,000 mg by mouth daily.    Steven Hodge doxycycline (VIBRA-TABS) 100 MG tablet Take 1 tablet (100 mg total) by mouth 2 (two) times daily. 20 tablet 0  . fish oil-omega-3 fatty acids 1000 MG capsule Take 2 g by mouth daily.    Steven Hodge glucosamine-chondroitin 500-400 MG tablet Take 1 tablet by mouth once.    Steven Hodge glucose blood (BAYER CONTOUR NEXT TEST) test strip USE TO CHECK BLOOD SUGAR UP TO 4 TIMES ADAY 100 each 0  . metFORMIN (GLUCOPHAGE) 500 MG tablet Take 1 tablet (500 mg total) by mouth 2 (two) times daily with a meal. (Patient taking differently: Take 500 mg by mouth daily with breakfast. ) 60 tablet 0  . Multiple Vitamin (MULTIVITAMIN) tablet Take 1 tablet by mouth daily.    . TURMERIC PO Take 1,000 mg by mouth 2 (two) times daily.     No current facility-administered medications on file prior to visit.     PAST MEDICAL HISTORY: Past Medical History:  Diagnosis Date  . Back pain   . Cancer ([place]) 06/09/2006   Basal cell carcinoma scalp;   Steven Hodge  Diabetes mellitus without complication ([place])   . Hx of blood clots   . Hypertension     PAST SURGICAL HISTORY: Past Surgical History:  Procedure Laterality Date  . basel cell cancer removed  2009  . KNEE SURGERY  2005   ACL repair R    SOCIAL HISTORY: Social History   Tobacco Use  . Smoking status: Former Research scientist (life sciences)  . Smokeless tobacco: Never Used  Substance Use Topics  . Alcohol use: No  . Drug use: No    FAMILY HISTORY: Family History  Problem Relation Age of Onset  . Diabetes Mother   . Liver disease Mother   . Obesity Mother   . Cancer Father 5       lung cancer  . Diabetes Father   . Cancer Sister 52        lung cancer  . Heart disease Brother 46       cardiac stenting/CAD  . Cancer Brother        skin cancer  . Arthritis Brother     ROS: Review of Systems  Constitutional: Positive for weight loss.  Respiratory: Negative for shortness of breath.   Cardiovascular: Negative for chest pain.  Gastrointestinal: Negative for nausea and vomiting.  Musculoskeletal:       Negative muscle weakness  Endo/Heme/Allergies:       Negative hypoglycemia    PHYSICAL EXAM: Blood pressure 124/77, pulse 74, temperature 97.9 F (36.6 C), temperature source Oral, height '5\' 8"'$  (1.727 m), weight 256 lb (116.1 kg), SpO2 97 %. Body mass index is 38.92 kg/m. Physical Exam  Constitutional: He is oriented to person, place, and time. He appears well-developed and well-nourished.  Cardiovascular: Normal rate.  Pulmonary/Chest: Effort normal.  Musculoskeletal: Normal range of motion.  Neurological: He is oriented to person, place, and time.  Skin: Skin is warm and dry.  Psychiatric: He has a normal mood and affect. His behavior is normal.  Vitals reviewed.   RECENT LABS AND TESTS: BMET    Component Value Date/Time   NA 143 10/12/2017 0846   K 5.0 10/12/2017 0846   CL 107 (H) 10/12/2017 0846   CO2 19 (L) 10/12/2017 0846   GLUCOSE 145 (H) 10/12/2017 0846   GLUCOSE 146 (H) 04/22/2016 1349   BUN 18 10/12/2017 0846   CREATININE 0.80 10/12/2017 0846   CREATININE 0.88 04/22/2016 1349   CALCIUM 9.1 10/12/2017 0846   GFRNONAA 101 10/12/2017 0846   GFRAA 117 10/12/2017 0846   Lab Results  Component Value Date   HGBA1C 6.1 (H) 10/12/2017   HGBA1C 6.4 (H) 06/03/2017   HGBA1C 6.0 (H) 02/12/2017   HGBA1C 6.7 (H) 09/18/2016   HGBA1C 6.7 (H) 08/20/2016   Lab Results  Component Value Date   INSULIN 31.8 (H) 10/12/2017   INSULIN 30.2 (H) 02/12/2017   INSULIN 32.4 (H) 09/18/2016   CBC    Component Value Date/Time   WBC 9.8 09/28/2017 1642   WBC 9.8 11/25/2016 1119   WBC 8.2 04/22/2016 1349   RBC  5.12 09/28/2017 1642   RBC 4.57 (A) 11/25/2016 1119   RBC 5.24 04/22/2016 1349   HGB 15.1 09/28/2017 1642   HCT 43.2 09/28/2017 1642   PLT 254 09/28/2017 1642   MCV 84 09/28/2017 1642   MCH 29.5 09/28/2017 1642   MCH 31.5 (A) 11/25/2016 1119   MCH 30.9 04/22/2016 1349   MCHC 35.0 09/28/2017 1642   MCHC 36.1 (A) 11/25/2016 1119   MCHC 34.4 04/22/2016 1349   RDW  13.6 09/28/2017 1642   LYMPHSABS 2.1 09/28/2017 1642   MONOABS 656 04/22/2016 1349   EOSABS 0.2 09/28/2017 1642   BASOSABS 0.0 09/28/2017 1642   Iron/TIBC/Ferritin/ %Sat No results found for: IRON, TIBC, FERRITIN, IRONPCTSAT Lipid Panel     Component Value Date/Time   CHOL 184 10/12/2017 0846   TRIG 71 10/12/2017 0846   HDL 46 10/12/2017 0846   CHOLHDL 4.0 06/03/2017 1130   CHOLHDL 4.4 04/22/2016 1349   VLDL 30 04/22/2016 1349   LDLCALC 124 (H) 10/12/2017 0846   Hepatic Function Panel     Component Value Date/Time   PROT 6.4 10/12/2017 0846   ALBUMIN 3.8 10/12/2017 0846   AST 13 10/12/2017 0846   ALT 20 10/12/2017 0846   ALKPHOS 60 10/12/2017 0846   BILITOT <0.2 10/12/2017 0846      Component Value Date/Time   TSH 2.230 09/18/2016 0956   TSH 2.44 04/22/2016 1349   TSH 1.731 03/09/2015 1524  Results for Steven Hodge, Steven Hodge (MRN [id number]) as of 10/13/2017 10:04  Ref. Range 06/03/2017 11:30  Vitamin D, 25-Hydroxy Latest Ref Range: 30.0 - 100.0 ng/mL 37.2    ASSESSMENT AND PLAN: Type 2 diabetes mellitus without complication, without long-term current use of insulin (HCC) - Plan: Comprehensive metabolic panel, Hemoglobin A1c, Insulin, random, liraglutide (VICTOZA) 18 MG/3ML SOPN, Insulin Pen Needle (SURE COMFORT PEN NEEDLES) 32G X 4 MM MISC  Vitamin D deficiency - Plan: VITAMIN D 25 Hydroxy (Vit-D Deficiency, Fractures), Vitamin D, Ergocalciferol, (DRISDOL) 50000 units CAPS capsule  Essential hypertension - Plan: Lipid Panel With LDL/HDL Ratio, lisinopril (PRINIVIL,ZESTRIL) 5 MG tablet  At risk for heart  disease  Class 2 severe obesity with serious comorbidity and body mass index (BMI) of 39.0 to 39.9 in adult, unspecified obesity type (HCC)  PLAN:  Diabetes II Steven Hodge has been given extensive diabetes education by myself today including ideal fasting and post-prandial blood glucose readings, individual ideal Hgb A1c goals and hypoglycemia prevention. We discussed the importance of good blood sugar control to decrease the likelihood of diabetic complications such as nephropathy, neuropathy, limb loss, blindness, coronary artery disease, and death. We discussed the importance of intensive lifestyle modification including diet, exercise and weight loss as the first line treatment for diabetes. Steven Hodge agrees to continue Victoza 5 pens and we will refill for 1 month and we will refill pen needles for 1 month. We will check labs and Steven Hodge agrees to follow up with our clinic in 4 weeks.  Vitamin D Deficiency Steven Hodge was informed that low vitamin D levels contributes to fatigue and are associated with obesity, breast, and colon cancer. Steven Hodge agrees to continue taking prescription Vit D _0 ,000 IU every week #4 and we will refill for 1 month. He will follow up for routine testing of vitamin D, at least 2-3 times per year. He was informed of the risk of over-replacement of vitamin D and agrees to not increase his dose unless he discusses this with Steven Hodge first. We will check labs and Steven Hodge agrees to follow up with our clinic in 4 weeks.  Hypertension We discussed sodium restriction, working on healthy weight loss, and a regular exercise program as the means to achieve improved blood pressure control. Steven Hodge agreed with this plan and agreed to follow up as directed. We will continue to monitor his blood pressure as well as his progress with the above lifestyle modifications. Steven Hodge agrees to continue taking lisinopril 5 mg qd #30 and we will refill for 1 month. He will watch  for signs of hypotension as he continues his  lifestyle modifications. Steven Hodge agrees to follow up with our clinic in 4 weeks.  Cardiovascular risk counselling Steven Hodge was given extended (15 minutes) coronary artery disease prevention counseling today. He is 57 y.o. male and has risk factors for heart disease including obesity and hypertension. We discussed intensive lifestyle modifications today with an emphasis on specific weight loss instructions and strategies. Pt was also informed of the importance of increasing exercise and decreasing saturated fats to help prevent heart disease.  Obesity Steven Hodge is currently in the action stage of change. As such, his goal is to continue with weight loss efforts He has agreed to follow the Category 3 plan Steven Hodge has been instructed to work up to a goal of 150 minutes of combined cardio and strengthening exercise per week for weight loss and overall health benefits. We discussed the following Behavioral Modification Strategies today: increasing lean protein intake and planning for success   Ulysess has agreed to follow up with our clinic in 4 weeks. He was informed of the importance of frequent follow up visits to maximize his success with intensive lifestyle modifications for his multiple health conditions.   OBESITY BEHAVIORAL INTERVENTION VISIT  Today's visit was # 19 out of 22.  Starting weight: 272 lbs Starting date: 09/18/16 Today's weight : 256 lbs  Today's date: 10/12/2017 Total lbs lost to date: 16 (Patients must lose 7 lbs in the first 6 months to continue with counseling)   ASK: We discussed the diagnosis of obesity with Steven Hodge today and Steven Hodge agreed to give Steven Hodge permission to discuss obesity behavioral modification therapy today.  ASSESS: Steven Hodge has the diagnosis of obesity and his BMI today is 38.93 Steven Hodge is in the action stage of change   ADVISE: Steven Hodge was educated on the multiple health risks of obesity as well as the benefit of weight loss to improve his health. He was advised  of the need for long term treatment and the importance of lifestyle modifications.  AGREE: Multiple dietary modification options and treatment options were discussed and  Steven Hodge agreed to the above obesity treatment plan.   Steven Hodge, am acting as transcriptionist for Lacy Duverney, PA-C I, Lacy Duverney [facility], have reviewed this note and agree with its content

## 2017-10-23 DIAGNOSIS — E119 Type 2 diabetes mellitus without complications: Secondary | ICD-10-CM | POA: Diagnosis not present

## 2017-10-23 LAB — HM DIABETES EYE EXAM

## 2017-11-02 ENCOUNTER — Encounter: Payer: Self-pay | Admitting: Family Medicine

## 2017-11-05 ENCOUNTER — Ambulatory Visit (INDEPENDENT_AMBULATORY_CARE_PROVIDER_SITE_OTHER): Payer: BLUE CROSS/BLUE SHIELD | Admitting: Physician Assistant

## 2017-11-05 ENCOUNTER — Encounter (INDEPENDENT_AMBULATORY_CARE_PROVIDER_SITE_OTHER): Payer: Self-pay | Admitting: Physician Assistant

## 2017-11-05 VITALS — BP 116/73 | HR 76 | Temp 97.9°F | Ht 68.0 in | Wt 259.0 lb

## 2017-11-05 DIAGNOSIS — E119 Type 2 diabetes mellitus without complications: Secondary | ICD-10-CM | POA: Diagnosis not present

## 2017-11-05 DIAGNOSIS — Z9189 Other specified personal risk factors, not elsewhere classified: Secondary | ICD-10-CM

## 2017-11-05 DIAGNOSIS — Z6839 Body mass index (BMI) 39.0-39.9, adult: Secondary | ICD-10-CM

## 2017-11-05 DIAGNOSIS — I10 Essential (primary) hypertension: Secondary | ICD-10-CM

## 2017-11-05 DIAGNOSIS — E559 Vitamin D deficiency, unspecified: Secondary | ICD-10-CM

## 2017-11-05 MED ORDER — LISINOPRIL 5 MG PO TABS: 5.0000 mg | ORAL_TABLET | Freq: Every day | ORAL | 0 refills | Status: DC

## 2017-11-05 MED ORDER — LIRAGLUTIDE 18 MG/3ML ~~LOC~~ SOPN: 1.5000 mg | PEN_INJECTOR | Freq: Every morning | SUBCUTANEOUS | 0 refills | Status: DC

## 2017-11-05 MED ORDER — VITAMIN D (ERGOCALCIFEROL) 1.25 MG (50000 UNIT) PO CAPS: 50000.0000 [IU] | ORAL_CAPSULE | ORAL | 0 refills | Status: DC

## 2017-11-05 NOTE — Progress Notes (Signed)
Office: [phone]  /  Fax: [phone]   HPI:   Chief Complaint: OBESITY Steven Hodge is here to discuss his progress with his obesity treatment plan. He is on the Category 3 plan and is following his eating plan approximately 100 % of the time. He states he is getting up hay for 60 minutes 2 times per week. Steven Hodge makes mindful of his choices and controls his portions, states he has not been getting the recommended protein on meal plan. he declines following a structured meal plan and states he would not like to keep a food journal.  His weight is 259 lb (117.5 kg) today and has gained 3 pounds since his last visit. He has lost 13 lbs since starting treatment with [name].  Diabetes II Steven Hodge has a diagnosis of diabetes type II. Steven Hodge states fasting BGs range between 110 and 170's and he denies hypoglycemia. He has had constipation from metformin, would like to stop metformin. He is on Victoza at 1.5 mg. Last A1c was 6.1 on 10/12/17. He has been working on intensive lifestyle modifications including diet, exercise, and weight loss to help control his blood glucose levels.  Hypertension Steven Hodge is a 57 y.o. male with hypertension. [place]'s blood pressure is stable and he denies chest pain or shortness of breath. He is working weight loss to help control his blood pressure with the goal of decreasing his risk of heart attack and stroke. Steven Hodge's blood pressure is currently controlled.  At risk for cardiovascular disease Steven Hodge is at a higher than average risk for cardiovascular disease due to obesity, diabetes II, and hypertension. He currently denies any chest pain.  Vitamin D Deficiency Steven Hodge has a diagnosis of vitamin D deficiency. He is currently taking prescription Vit D and denies nausea, vomiting or muscle weakness.  ALLERGIES: No Known Allergies  MEDICATIONS: Current Outpatient Medications on File Prior to Visit  Medication Sig Dispense Refill  . allopurinol (ZYLOPRIM) 100 MG tablet  Take 1 tablet (100 mg total) by mouth daily. 30 tablet 1  . aspirin 325 MG tablet Take 325 mg by mouth daily.    . blood glucose meter kit and supplies Dispense based on patient and insurance preference. Use up to four times daily as directed. (FOR ICD-9 250.00, 250.01). 1 each 0  . Cinnamon 500 MG TABS Take 1,000 mg by mouth daily.    . fish oil-omega-3 fatty acids 1000 MG capsule Take 2 g by mouth daily.    [name] glucosamine-chondroitin 500-400 MG tablet Take 1 tablet by mouth once.    [name] glucose blood (BAYER CONTOUR NEXT TEST) test strip USE TO CHECK BLOOD SUGAR UP TO 4 TIMES ADAY 100 each 0  . Insulin Pen Needle (SURE COMFORT PEN NEEDLES) 32G X 4 MM MISC USE FOR VICTOZA INJECTIONS 100 each 0  . Multiple Vitamin (MULTIVITAMIN) tablet Take 1 tablet by mouth daily.    . TURMERIC PO Take 1,000 mg by mouth 2 (two) times daily.     No current facility-administered medications on file prior to visit.     PAST MEDICAL HISTORY: Past Medical History:  Diagnosis Date  . Back pain   . Cancer ([place]) 06/09/2006   Basal cell carcinoma scalp;   [name] Diabetes mellitus without complication ([place])   . Hx of blood clots   . Hypertension     PAST SURGICAL HISTORY: Past Surgical History:  Procedure Laterality Date  . basel cell cancer removed  2009  . KNEE SURGERY  2005  ACL repair R    SOCIAL HISTORY: Social History   Tobacco Use  . Smoking status: Former Research scientist (life sciences)  . Smokeless tobacco: Never Used  Substance Use Topics  . Alcohol use: No  . Drug use: No    FAMILY HISTORY: Family History  Problem Relation Age of Onset  . Diabetes Mother   . Liver disease Mother   . Obesity Mother   . Cancer Father 16       lung cancer  . Diabetes Father   . Cancer Sister 3       lung cancer  . Heart disease Brother 47       cardiac stenting/CAD  . Cancer Brother        skin cancer  . Arthritis Brother     ROS: Review of Systems  Constitutional: Negative for weight loss.  Respiratory: Negative for  shortness of breath.   Cardiovascular: Negative for chest pain.  Gastrointestinal: Positive for constipation. Negative for nausea and vomiting.  Musculoskeletal:       Negative muscle weakness  Endo/Heme/Allergies:       Negative hypoglycemia    PHYSICAL EXAM: Blood pressure 116/73, pulse 76, temperature 97.9 F (36.6 C), temperature source Oral, height '5\' 8"'  (1.727 m), weight 259 lb (117.5 kg), SpO2 97 %. Body mass index is 39.38 kg/m. Physical Exam  Constitutional: He is oriented to person, place, and time. He appears well-developed and well-nourished.  Cardiovascular: Normal rate.  Pulmonary/Chest: Effort normal.  Musculoskeletal: Normal range of motion.  Neurological: He is oriented to person, place, and time.  Skin: Skin is warm and dry.  Psychiatric: He has a normal mood and affect. His behavior is normal.  Vitals reviewed.   RECENT LABS AND TESTS: BMET    Component Value Date/Time   NA 143 10/12/2017 0846   K 5.0 10/12/2017 0846   CL 107 (H) 10/12/2017 0846   CO2 19 (L) 10/12/2017 0846   GLUCOSE 145 (H) 10/12/2017 0846   GLUCOSE 146 (H) 04/22/2016 1349   BUN 18 10/12/2017 0846   CREATININE 0.80 10/12/2017 0846   CREATININE 0.88 04/22/2016 1349   CALCIUM 9.1 10/12/2017 0846   GFRNONAA 101 10/12/2017 0846   GFRAA 117 10/12/2017 0846   Lab Results  Component Value Date   HGBA1C 6.1 (H) 10/12/2017   HGBA1C 6.4 (H) 06/03/2017   HGBA1C 6.0 (H) 02/12/2017   HGBA1C 6.7 (H) 09/18/2016   HGBA1C 6.7 (H) 08/20/2016   Lab Results  Component Value Date   INSULIN 31.8 (H) 10/12/2017   INSULIN 30.2 (H) 02/12/2017   INSULIN 32.4 (H) 09/18/2016   CBC    Component Value Date/Time   WBC 9.8 09/28/2017 1642   WBC 9.8 11/25/2016 1119   WBC 8.2 04/22/2016 1349   RBC 5.12 09/28/2017 1642   RBC 4.57 (A) 11/25/2016 1119   RBC 5.24 04/22/2016 1349   HGB 15.1 09/28/2017 1642   HCT 43.2 09/28/2017 1642   PLT 254 09/28/2017 1642   MCV 84 09/28/2017 1642   MCH 29.5  09/28/2017 1642   MCH 31.5 (A) 11/25/2016 1119   MCH 30.9 04/22/2016 1349   MCHC 35.0 09/28/2017 1642   MCHC 36.1 (A) 11/25/2016 1119   MCHC 34.4 04/22/2016 1349   RDW 13.6 09/28/2017 1642   LYMPHSABS 2.1 09/28/2017 1642   MONOABS 656 04/22/2016 1349   EOSABS 0.2 09/28/2017 1642   BASOSABS 0.0 09/28/2017 1642   Iron/TIBC/Ferritin/ %Sat No results found for: IRON, TIBC, FERRITIN, IRONPCTSAT Lipid Panel  Component Value Date/Time   CHOL 184 10/12/2017 0846   TRIG 71 10/12/2017 0846   HDL 46 10/12/2017 0846   CHOLHDL 4.0 06/03/2017 1130   CHOLHDL 4.4 04/22/2016 1349   VLDL 30 04/22/2016 1349   LDLCALC 124 (H) 10/12/2017 0846   Hepatic Function Panel     Component Value Date/Time   PROT 6.4 10/12/2017 0846   ALBUMIN 3.8 10/12/2017 0846   AST 13 10/12/2017 0846   ALT 20 10/12/2017 0846   ALKPHOS 60 10/12/2017 0846   BILITOT <0.2 10/12/2017 0846      Component Value Date/Time   TSH 2.230 09/18/2016 0956   TSH 2.44 04/22/2016 1349   TSH 1.731 03/09/2015 1524  Results for MACOY, RODWELL (MRN [id number]) as of 11/06/2017 07:24  Ref. Range 10/12/2017 08:46  Vitamin D, 25-Hydroxy Latest Ref Range: 30.0 - 100.0 ng/mL 40.5    ASSESSMENT AND PLAN: Type 2 diabetes mellitus without complication, without long-term current use of insulin (HCC) - Plan: liraglutide (VICTOZA) 18 MG/3ML SOPN  Essential hypertension - Plan: lisinopril (PRINIVIL,ZESTRIL) 5 MG tablet  Vitamin D deficiency - Plan: Vitamin D, Ergocalciferol, (DRISDOL) 50000 units CAPS capsule  At risk for heart disease  Class 2 severe obesity with serious comorbidity and body mass index (BMI) of 39.0 to 39.9 in adult, unspecified obesity type ([place])  PLAN:  Diabetes II Steven Hodge has been given extensive diabetes education by myself today including ideal fasting and post-prandial blood glucose readings, individual ideal Hgb A1c goals and hypoglycemia prevention. We discussed the importance of good blood sugar control to  decrease the likelihood of diabetic complications such as nephropathy, neuropathy, limb loss, blindness, coronary artery disease, and death. We discussed the importance of intensive lifestyle modification including diet, exercise and weight loss as the first line treatment for diabetes. Steven Hodge agrees to stop metformin and he agrees to continue Victoza 1.5 mg #3 pens and we will refill for 1 month. Steven Hodge agrees to follow up with our clinic in 4 weeks.  Hypertension We discussed sodium restriction, working on healthy weight loss, and a regular exercise program as the means to achieve improved blood pressure control. Steven Hodge agreed with this plan and agreed to follow up as directed. We will continue to monitor his blood pressure as well as his progress with the above lifestyle modifications. Steven Hodge agrees to continue taking lisinopril 5 mg qd #30 and we will refill for 1 month. He will watch for signs of hypotension as he continues his lifestyle modifications. Steven Hodge agrees to follow up with our clinic in 4 weeks.  Cardiovascular risk counselling Steven Hodge was given extended (15 minutes) coronary artery disease prevention counseling today. He is 57 y.o. male and has risk factors for heart disease including obesity, diabetes II, and hypertension. We discussed intensive lifestyle modifications today with an emphasis on specific weight loss instructions and strategies. Pt was also informed of the importance of increasing exercise and decreasing saturated fats to help prevent heart disease.  Vitamin D Deficiency Steven Hodge was informed that low vitamin D levels contributes to fatigue and are associated with obesity, breast, and colon cancer. Steven Hodge agrees to continue taking prescription Vit D '@50' ,000 IU every week #4 and we will refill for 1 month. He will follow up for routine testing of vitamin D, at least 2-3 times per year. He was informed of the risk of over-replacement of vitamin D and agrees to not increase his dose  unless he discusses this with [name] first. Steven Hodge agrees to follow up with our  clinic in 4 weeks.  Obesity Steven Hodge is currently in the action stage of change. As such, his goal is to continue with weight loss efforts He has agreed to portion control better and make smarter food choices, such as increase vegetables and decrease simple carbohydrates  Steven Hodge has been instructed to work up to a goal of 150 minutes of combined cardio and strengthening exercise per week for weight loss and overall health benefits. We discussed the following Behavioral Modification Strategies today: increasing lean protein intake and work on meal planning and easy cooking plans   Steven Hodge has agreed to follow up with our clinic in 4 weeks. He was informed of the importance of frequent follow up visits to maximize his success with intensive lifestyle modifications for his multiple health conditions.   OBESITY BEHAVIORAL INTERVENTION VISIT  Today's visit was # 20 out of 22.  Starting weight: 272 lbs Starting date: 09/18/16 Today's weight : 259 lbs  Today's date: 11/05/2017 Total lbs lost to date: 13 (Patients must lose 7 lbs in the first 6 months to continue with counseling)   ASK: We discussed the diagnosis of obesity with Steven Hodge today and Steven Hodge agreed to give [name] permission to discuss obesity behavioral modification therapy today.  ASSESS: Steven Hodge has the diagnosis of obesity and his BMI today is 39.39 Steven Hodge is in the action stage of change   ADVISE: [name] was educated on the multiple health risks of obesity as well as the benefit of weight loss to improve his health. He was advised of the need for long term treatment and the importance of lifestyle modifications.  AGREE: Multiple dietary modification options and treatment options were discussed and  Jeniel agreed to the above obesity treatment plan.   Wilhemena Durie, am acting as transcriptionist for Lacy Duverney, PA-C I, Lacy Duverney [facility], have reviewed this  note and agree with its content

## 2017-11-06 ENCOUNTER — Other Ambulatory Visit: Payer: Self-pay

## 2017-11-06 ENCOUNTER — Encounter: Payer: Self-pay | Admitting: Family Medicine

## 2017-11-06 ENCOUNTER — Ambulatory Visit: Payer: BLUE CROSS/BLUE SHIELD | Admitting: Family Medicine

## 2017-11-06 VITALS — BP 122/68 | HR 96 | Temp 98.0°F | Resp 16 | Wt 260.0 lb

## 2017-11-06 DIAGNOSIS — I10 Essential (primary) hypertension: Secondary | ICD-10-CM

## 2017-11-06 DIAGNOSIS — M1A09X1 Idiopathic chronic gout, multiple sites, with tophus (tophi): Secondary | ICD-10-CM

## 2017-11-06 DIAGNOSIS — E119 Type 2 diabetes mellitus without complications: Secondary | ICD-10-CM

## 2017-11-06 NOTE — Progress Notes (Signed)
Subjective:    Patient ID: Steven Hodge, male    DOB: 1961/03/25, 57 y.o.   MRN: [id number]  11/06/2017  Knee Issues (pt states he need to follow-up on labs)    HPI This 57 y.o. male presents for evaluation of LEFT KNEE ISSUES.  S/p evaluation of pain and swelling of LEFT knee on 09/28/17.  At that visit, wound culture obtained and uric acid level obtained.  Attempt at synovial fluid analysis yet unsuccessful.  Uric acid level at that visit was 7.3.  Allopurinol 118m daily sent in; Doxycycline sent in to treat any underlying infection.  Has done well since last visit.  L knee has not swelled or become red or irritated.  Tolerating allopurinol without side effects.  Chronic localized swelling at LEFT knee is unchanged; no smaller since initiation of allopurinol.  There are several neighbors with meat allergy due to tick bite. [place] tick causes meat allergy. Multiple types of ticks.   Tophi is smaller on knee.  No major difference. Knot present .   BP Readings from Last 3 Encounters:  12/28/17 123/78  12/02/17 119/73  11/06/17 122/68   Wt Readings from Last 3 Encounters:  12/28/17 263 lb (119.3 kg)  12/02/17 262 lb (118.8 kg)  11/06/17 260 lb (117.9 kg)   Immunization History  Administered Date(s) Administered  . Hepatitis A, Adult 12/02/2016, 06/03/2017  . Hepatitis B, adult 04/22/2016, 08/20/2016, 12/02/2016  . Influenza,inj,Quad PF,6+ Mos 03/09/2015, 04/22/2016, 06/03/2017  . Pneumococcal Polysaccharide-23 03/09/2015  . Tdap 06/09/2008    Review of Systems  Constitutional: Negative for activity change, appetite change, chills, diaphoresis, fatigue and fever.  Respiratory: Negative for cough and shortness of breath.   Cardiovascular: Negative for chest pain, palpitations and leg swelling.  Gastrointestinal: Negative for abdominal pain, diarrhea, nausea and vomiting.  Endocrine: Negative for cold intolerance, heat intolerance, polydipsia, polyphagia and polyuria.    Musculoskeletal: Negative for arthralgias, gait problem and joint swelling.  Skin: Negative for color change, rash and wound.  Neurological: Negative for dizziness, tremors, seizures, syncope, facial asymmetry, speech difficulty, weakness, light-headedness, numbness and headaches.  Psychiatric/Behavioral: Negative for dysphoric mood and sleep disturbance. The patient is not nervous/anxious.     Past Medical History:  Diagnosis Date  . Back pain   . Cancer ([place] 06/09/2006   Basal cell carcinoma scalp;   .Marland KitchenDiabetes mellitus without complication ([place]   . Hx of blood clots   . Hypertension    Past Surgical History:  Procedure Laterality Date  . basel cell cancer removed  2009  . KNEE SURGERY  2005   ACL repair R   No Known Allergies Current Outpatient Medications on File Prior to Visit  Medication Sig Dispense Refill  . aspirin 325 MG tablet Take 325 mg by mouth daily.    . blood glucose meter kit and supplies Dispense based on patient and insurance preference. Use up to four times daily as directed. (FOR ICD-9 250.00, 250.01). 1 each 0  . Cinnamon 500 MG TABS Take 1,000 mg by mouth daily.    . fish oil-omega-3 fatty acids 1000 MG capsule Take 2 g by mouth daily.    .Marland Kitchenglucosamine-chondroitin 500-400 MG tablet Take 1 tablet by mouth once.    . Multiple Vitamin (MULTIVITAMIN) tablet Take 1 tablet by mouth daily.    . TURMERIC PO Take 1,000 mg by mouth 2 (two) times daily.     No current facility-administered medications on file prior to visit.    Social  History   Socioeconomic History  . Marital status: Married    Spouse name: Not on file  . Number of children: Not on file  . Years of education: Not on file  . Highest education level: Not on file  Occupational History  . Occupation: Medical illustrator, Dealer  Social Needs  . Financial resource strain: Not on file  . Food insecurity:    Worry: Not on file    Inability: Not on file  . Transportation needs:    Medical: Not  on file    Non-medical: Not on file  Tobacco Use  . Smoking status: Former Research scientist (life sciences)  . Smokeless tobacco: Never Used  Substance and Sexual Activity  . Alcohol use: No  . Drug use: No  . Sexual activity: Yes  Lifestyle  . Physical activity:    Days per week: Not on file    Minutes per session: Not on file  . Stress: Not on file  Relationships  . Social connections:    Talks on phone: Not on file    Gets together: Not on file    Attends religious service: Not on file    Active member of club or organization: Not on file    Attends meetings of clubs or organizations: Not on file    Relationship status: Not on file  . Intimate partner violence:    Fear of current or ex partner: Not on file    Emotionally abused: Not on file    Physically abused: Not on file    Forced sexual activity: Not on file  Other Topics Concern  . 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  Walking once per week in 2018.         Family History  Problem Relation Age of Onset  . Diabetes Mother   . Liver disease Mother   . Obesity Mother   . Cancer Father 35       lung cancer  . Diabetes Father   . Cancer Sister 65       lung cancer  . Heart disease Brother 30       cardiac stenting/CAD  . Cancer Brother        skin cancer  . Arthritis Brother        Objective:    BP 122/68   Pulse 96   Temp 98 F (36.7 C) (Oral)   Resp 16   Wt 260 lb (117.9 kg)   SpO2 98%   BMI 39.53 kg/m  Physical Exam  Constitutional: He is oriented to person, place, and time. He appears well-developed and well-nourished. No distress.  HENT:  Head: Normocephalic and atraumatic.  Right Ear: External ear normal.  Left Ear: External ear normal.  Nose: Nose  normal.  Mouth/Throat: Oropharynx is clear and moist.  Eyes: Pupils are equal, round, and reactive to light. Conjunctivae and EOM are normal.  Neck: Normal range of motion. Neck supple. Carotid bruit is not present. No thyromegaly present.  Cardiovascular: Normal rate, regular rhythm, normal heart sounds and  intact distal pulses. Exam reveals no gallop and no friction rub.  No murmur heard. Pulmonary/Chest: Effort normal and breath sounds normal. He has no wheezes. He has no rales.  Musculoskeletal:       Left knee: He exhibits deformity. He exhibits normal range of motion, no swelling, no effusion, no ecchymosis, no laceration, no erythema, normal alignment, no bony tenderness and normal meniscus. No MCL, no LCL and no patellar tendon tenderness noted.       Legs: Localized area of swelling 1.5 cm diameter in subcutaneous region.  No joint involvement.  Lymphadenopathy:    He has no cervical adenopathy.  Neurological: He is alert and oriented to person, place, and time. No cranial nerve deficit.  Skin: Skin is warm and dry. No rash noted. He is not diaphoretic.  Psychiatric: He has a normal mood and affect. His behavior is normal.  Nursing note and vitals reviewed.  No results found. Depression screen [facility] 2/9 11/06/2017 09/28/2017 06/29/2017 06/03/2017 05/20/2017  Decreased Interest 0 0 0 0 0  Down, Depressed, Hopeless 0 0 0 0 0  PHQ - 2 Score 0 0 0 0 0  Altered sleeping - - - - -  Tired, decreased energy - - - - -  Change in appetite - - - - -  Feeling bad or failure about yourself  - - - - -  Trouble concentrating - - - - -  Moving slowly or fidgety/restless - - - - -  Suicidal thoughts - - - - -  PHQ-9 Score - - - - -   Fall Risk  11/06/2017 09/28/2017 06/29/2017 06/03/2017 05/20/2017  Falls in the past year? 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 Type 2 diabetes mellitus without complication,  without long-term current use of insulin ([place])   3. Essential hypertension   4. 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  Treat with Allopurinol; repeat uric acid level; will likely warrant an increased dose of Allopurinol.    Orders Placed This Encounter  Procedures  . CBC with Differential/Platelet  . Comprehensive metabolic panel  . Uric acid   No orders of the defined types were placed in this encounter.   No follow-ups on file.   Kristi Elayne Guerin, M.D. Primary [facility] at [facility] [address] [place], [place]  [id number] [phone] phone [phone] fax

## 2017-11-06 NOTE — Patient Instructions (Addendum)
Steven Solo, MD --- [facility] Grinder, MD --- [place], MD -- [facility]  IF you received an x-ray today, you will receive an invoice from [facility]. Please contact [facility] at [phone] with questions or concerns regarding your invoice.   IF you received labwork today, you will receive an invoice from [place]. Please contact [facility] at [phone] with questions or concerns regarding your invoice.   Our billing staff will not be able to assist you with questions regarding bills from these companies.  You will be contacted with the lab results as soon as they are available. The fastest way to get your results is to activate your My Chart account. Instructions are located on the last page of this paperwork. If you have not heard from [name] regarding the results in 2 weeks, please contact this office.    Low-Purine Diet Purines are compounds that affect the level of uric acid in your body. A low-purine diet is a diet that is low in purines. Eating a low-purine diet can prevent the level of uric acid in your body from getting too high and causing gout or kidney stones or both. What do I need to know about this diet?  Choose low-purine foods. Examples of low-purine foods are listed in the next section.  Drink plenty of fluids, especially water. Fluids can help remove uric acid from your body. Try to drink 8-16 cups (1.9-3.8 L) a day.  Limit foods high in fat, especially saturated fat, as fat makes it harder for the body to get rid of uric acid. Foods high in saturated fat include pizza, cheese, ice cream, whole milk, fried foods, and gravies. Choose foods that are lower in fat and lean sources of protein. Use olive oil when cooking as it contains healthy fats that are not high in saturated fat.  Limit alcohol. Alcohol interferes with the elimination of uric acid from your body. If you are having a gout  attack, avoid all alcohol.  Keep in mind that different people's bodies react differently to different foods. You will probably learn over time which foods do or do not affect you. If you discover that a food tends to cause your gout to flare up, avoid eating that food. You can more freely enjoy foods that do not cause problems. If you have any questions about a food item, talk to your dietitian or health care provider. Which foods are low, moderate, and high in purines? The following is a list of foods that are low, moderate, and high in purines. You can eat any amount of the foods that are low in purines. You may be able to have small amounts of foods that are moderate in purines. Ask your health care provider how much of a food moderate in purines you can have. Avoid foods high in purines. Grains  Foods low in purines: Enriched white bread, pasta, rice, cake, cornbread, popcorn.  Foods moderate in purines: Whole-grain breads and cereals, wheat germ, bran, oatmeal. Uncooked oatmeal. Dry wheat bran or wheat germ.  Foods high in purines: Pancakes, [place] toast, biscuits, muffins. Vegetables  Foods low in purines: All vegetables, except those that are moderate in purines.  Foods moderate in purines: Asparagus, cauliflower, spinach, mushrooms, green peas. Fruits  All fruits are low in purines. Meats and other Protein Foods  Foods low in purines: Eggs, nuts, peanut butter.  Foods moderate in purines: 80-90% lean beef, lamb, veal, pork, poultry,  fish, eggs, peanut butter, nuts. Crab, lobster, oysters, and shrimp. Cooked dried beans, peas, and lentils.  Foods high in purines: Anchovies, sardines, herring, mussels, tuna, codfish, scallops, trout, and haddock. Berniece Salines. Organ meats (such as liver or kidney). Tripe. Game meat. Goose. Sweetbreads. Dairy  All dairy foods are low in purines. Low-fat and fat-free dairy products are best because they are low in saturated fat. Beverages  Drinks low in  purines: Water, carbonated beverages, tea, coffee, cocoa.  Drinks moderate in purines: Soft drinks and other drinks sweetened with high-fructose corn syrup. Juices. To find whether a food or drink is sweetened with high-fructose corn syrup, look at the ingredients list.  Drinks high in purines: Alcoholic beverages (such as beer). Condiments  Foods low in purines: Salt, herbs, olives, pickles, relishes, vinegar.  Foods moderate in purines: Butter, margarine, oils, mayonnaise. Fats and Oils  Foods low in purines: All types, except gravies and sauces made with meat.  Foods high in purines: Gravies and sauces made with meat. Other Foods  Foods low in purines: Sugars, sweets, gelatin. Cake. Soups made without meat.  Foods moderate in purines: Meat-based or fish-based soups, broths, or bouillons. Foods and drinks sweetened with high-fructose corn syrup.  Foods high in purines: High-fat desserts (such as ice cream, cookies, cakes, pies, doughnuts, and chocolate). Contact your dietitian for more information on foods that are not listed here. This information is not intended to replace advice given to you by your health care provider. Make sure you discuss any questions you have with your health care provider. Document Released: 09/20/2010 Document Revised: 11/01/2015 Document Reviewed: 05/02/2013 Elsevier Interactive Patient Education  2017 [employer].

## 2017-11-07 LAB — URIC ACID: Uric Acid: 7.9 mg/dL (ref 3.7–8.6)

## 2017-11-07 LAB — CBC WITH DIFFERENTIAL/PLATELET
Basophils Absolute: 0 10*3/uL (ref 0.0–0.2)
Basos: 0 %
EOS (ABSOLUTE): 0.2 10*3/uL (ref 0.0–0.4)
Eos: 2 %
Hematocrit: 44.2 % (ref 37.5–51.0)
Hemoglobin: 15.3 g/dL (ref 13.0–17.7)
Immature Grans (Abs): 0 10*3/uL (ref 0.0–0.1)
Immature Granulocytes: 0 %
Lymphocytes Absolute: 2.5 10*3/uL (ref 0.7–3.1)
Lymphs: 30 %
MCH: 29.8 pg (ref 26.6–33.0)
MCHC: 34.6 g/dL (ref 31.5–35.7)
MCV: 86 fL (ref 79–97)
Monocytes Absolute: 0.7 10*3/uL (ref 0.1–0.9)
Monocytes: 8 %
Neutrophils Absolute: 5 10*3/uL (ref 1.4–7.0)
Neutrophils: 60 %
Platelets: 207 10*3/uL (ref 150–450)
RBC: 5.14 x10E6/uL (ref 4.14–5.80)
RDW: 14.1 % (ref 12.3–15.4)
WBC: 8.4 10*3/uL (ref 3.4–10.8)

## 2017-11-07 LAB — COMPREHENSIVE METABOLIC PANEL
ALT: 28 IU/L (ref 0–44)
AST: 20 IU/L (ref 0–40)
Albumin/Globulin Ratio: 1.6 (ref 1.2–2.2)
Albumin: 4.2 g/dL (ref 3.5–5.5)
Alkaline Phosphatase: 59 IU/L (ref 39–117)
BUN/Creatinine Ratio: 25 — ABNORMAL HIGH (ref 9–20)
BUN: 26 mg/dL — ABNORMAL HIGH (ref 6–24)
Bilirubin Total: 0.3 mg/dL (ref 0.0–1.2)
CO2: 22 mmol/L (ref 20–29)
Calcium: 9.4 mg/dL (ref 8.7–10.2)
Chloride: 105 mmol/L (ref 96–106)
Creatinine, Ser: 1.03 mg/dL (ref 0.76–1.27)
GFR calc Af Amer: 95 mL/min/{1.73_m2} (ref >59)
GFR calc non Af Amer: 82 mL/min/{1.73_m2} (ref >59)
Globulin, Total: 2.6 g/dL (ref 1.5–4.5)
Glucose: 121 mg/dL — ABNORMAL HIGH (ref 65–99)
Potassium: 4.2 mmol/L (ref 3.5–5.2)
Sodium: 141 mmol/L (ref 134–144)
Total Protein: 6.8 g/dL (ref 6.0–8.5)

## 2017-11-12 ENCOUNTER — Encounter: Payer: Self-pay | Admitting: Family Medicine

## 2017-11-16 ENCOUNTER — Encounter: Payer: Self-pay | Admitting: *Deleted

## 2017-11-30 ENCOUNTER — Encounter: Payer: Self-pay | Admitting: Family Medicine

## 2017-12-02 ENCOUNTER — Encounter (INDEPENDENT_AMBULATORY_CARE_PROVIDER_SITE_OTHER): Payer: Self-pay | Admitting: Physician Assistant

## 2017-12-02 ENCOUNTER — Ambulatory Visit (INDEPENDENT_AMBULATORY_CARE_PROVIDER_SITE_OTHER): Payer: BLUE CROSS/BLUE SHIELD | Admitting: Physician Assistant

## 2017-12-02 VITALS — BP 119/73 | HR 77 | Temp 97.9°F | Ht 68.0 in | Wt 262.0 lb

## 2017-12-02 DIAGNOSIS — Z9189 Other specified personal risk factors, not elsewhere classified: Secondary | ICD-10-CM | POA: Diagnosis not present

## 2017-12-02 DIAGNOSIS — I10 Essential (primary) hypertension: Secondary | ICD-10-CM

## 2017-12-02 DIAGNOSIS — Z6839 Body mass index (BMI) 39.0-39.9, adult: Secondary | ICD-10-CM | POA: Diagnosis not present

## 2017-12-02 DIAGNOSIS — E559 Vitamin D deficiency, unspecified: Secondary | ICD-10-CM

## 2017-12-02 DIAGNOSIS — E119 Type 2 diabetes mellitus without complications: Secondary | ICD-10-CM

## 2017-12-02 MED ORDER — LISINOPRIL 5 MG PO TABS: 5.0000 mg | ORAL_TABLET | Freq: Every day | ORAL | 0 refills | Status: DC

## 2017-12-02 MED ORDER — GLUCOSE BLOOD VI STRP: ORAL_STRIP | 0 refills | Status: DC

## 2017-12-02 MED ORDER — ALLOPURINOL 300 MG PO TABS: 300.0000 mg | ORAL_TABLET | Freq: Every day | ORAL | 1 refills | Status: DC

## 2017-12-02 MED ORDER — VITAMIN D (ERGOCALCIFEROL) 1.25 MG (50000 UNIT) PO CAPS: 50000.0000 [IU] | ORAL_CAPSULE | ORAL | 0 refills | Status: DC

## 2017-12-02 MED ORDER — LIRAGLUTIDE 18 MG/3ML ~~LOC~~ SOPN: 1.5000 mg | PEN_INJECTOR | Freq: Every morning | SUBCUTANEOUS | 0 refills | Status: DC

## 2017-12-02 MED ORDER — INSULIN PEN NEEDLE 32G X 4 MM MISC: 0 refills | Status: DC

## 2017-12-02 NOTE — Progress Notes (Signed)
Office: [phone]  /  Fax: [phone]   HPI:   Chief Complaint: OBESITY Steven Hodge is here to discuss his progress with his obesity treatment plan. He is on the portion control better and make smarter food choices, such as increase vegetables and decrease simple carbohydrates and is following his eating plan approximately 100 % of the time. He states he is walking for 20-30 minutes 1 times per week. Steven Hodge has not been as hungry, especially after increasing dose of Victoza. He is now back to eating the suggested food on the meal plan. He does not want a structured meal plan and is not keeping a strict food journal.  His weight is 262 lb (118.8 kg) today and has gained 3 pounds since his last visit. He has lost 10 lbs since starting treatment with [name].  Diabetes II Steven Hodge has a diagnosis of diabetes type II. Steven Hodge states fasting BGs range between 110's and 165. He denies hypoglycemia. He is at 1.5 mg of Victoza. He was on metformin in the past but stopped due to constipation. Last A1c was 6.1. He has been working on intensive lifestyle modifications including diet, exercise, and weight loss to help control his blood glucose levels.  Vitamin D Deficiency Steven Hodge has a diagnosis of vitamin D deficiency. He is currently taking prescription Vit D and denies nausea, vomiting or muscle weakness.  At risk for osteopenia and osteoporosis Steven Hodge is at higher risk of osteopenia and osteoporosis due to vitamin D deficiency.   Hypertension Steven Hodge is a 57 y.o. male with hypertension. Steven Hodge's blood pressure is stable and he denies chest pain or shortness of breath. He is working weight loss to help control his blood pressure with the goal of decreasing his risk of heart attack and stroke. Steven Hodge's blood pressure is currently controlled.  ALLERGIES: No Known Allergies  MEDICATIONS: Current Outpatient Medications on File Prior to Visit  Medication Sig Dispense Refill  . allopurinol (ZYLOPRIM) 300 MG  tablet Take 1 tablet (300 mg total) by mouth daily. 90 tablet 1  . aspirin 325 MG tablet Take 325 mg by mouth daily.    . blood glucose meter kit and supplies Dispense based on patient and insurance preference. Use up to four times daily as directed. (FOR ICD-9 250.00, 250.01). 1 each 0  . Cinnamon 500 MG TABS Take 1,000 mg by mouth daily.    . fish oil-omega-3 fatty acids 1000 MG capsule Take 2 g by mouth daily.    Steven Hodge glucosamine-chondroitin 500-400 MG tablet Take 1 tablet by mouth once.    . Multiple Vitamin (MULTIVITAMIN) tablet Take 1 tablet by mouth daily.    . TURMERIC PO Take 1,000 mg by mouth 2 (two) times daily.     No current facility-administered medications on file prior to visit.     PAST MEDICAL HISTORY: Past Medical History:  Diagnosis Date  . Back pain   . Cancer ([place]) 06/09/2006   Basal cell carcinoma scalp;   Steven Hodge Diabetes mellitus without complication ([place])   . Hx of blood clots   . Hypertension     PAST SURGICAL HISTORY: Past Surgical History:  Procedure Laterality Date  . basel cell cancer removed  2009  . KNEE SURGERY  2005   ACL repair R    SOCIAL HISTORY: Social History   Tobacco Use  . Smoking status: Former Research scientist (life sciences)  . Smokeless tobacco: Never Used  Substance Use Topics  . Alcohol use: No  . Drug use: No  FAMILY HISTORY: Family History  Problem Relation Age of Onset  . Diabetes Mother   . Liver disease Mother   . Obesity Mother   . Cancer Father 57       lung cancer  . Diabetes Father   . Cancer Sister 53       lung cancer  . Heart disease Brother 70       cardiac stenting/CAD  . Cancer Brother        skin cancer  . Arthritis Brother     ROS: Review of Systems  Constitutional: Negative for weight loss.  Respiratory: Negative for shortness of breath.   Cardiovascular: Negative for chest pain.  Gastrointestinal: Negative for nausea and vomiting.  Musculoskeletal:       Negative muscle weakness  Endo/Heme/Allergies:       Negative  hypoglycemia    PHYSICAL EXAM: Blood pressure 119/73, pulse 77, temperature 97.9 F (36.6 C), temperature source Oral, height '5\' 8"'  (1.727 m), weight 262 lb (118.8 kg), SpO2 97 %. Body mass index is 39.84 kg/m. Physical Exam  Constitutional: He is oriented to person, place, and time. He appears well-developed and well-nourished.  Cardiovascular: Normal rate.  Pulmonary/Chest: Effort normal.  Musculoskeletal: Normal range of motion.  Neurological: He is oriented to person, place, and time.  Skin: Skin is warm and dry.  Psychiatric: He has a normal mood and affect. His behavior is normal.  Vitals reviewed.   RECENT LABS AND TESTS: BMET    Component Value Date/Time   NA 141 11/06/2017 1717   K 4.2 11/06/2017 1717   CL 105 11/06/2017 1717   CO2 22 11/06/2017 1717   GLUCOSE 121 (H) 11/06/2017 1717   GLUCOSE 146 (H) 04/22/2016 1349   BUN 26 (H) 11/06/2017 1717   CREATININE 1.03 11/06/2017 1717   CREATININE 0.88 04/22/2016 1349   CALCIUM 9.4 11/06/2017 1717   GFRNONAA 82 11/06/2017 1717   GFRAA 95 11/06/2017 1717   Lab Results  Component Value Date   HGBA1C 6.1 (H) 10/12/2017   HGBA1C 6.4 (H) 06/03/2017   HGBA1C 6.0 (H) 02/12/2017   HGBA1C 6.7 (H) 09/18/2016   HGBA1C 6.7 (H) 08/20/2016   Lab Results  Component Value Date   INSULIN 31.8 (H) 10/12/2017   INSULIN 30.2 (H) 02/12/2017   INSULIN 32.4 (H) 09/18/2016   CBC    Component Value Date/Time   WBC 8.4 11/06/2017 1717   WBC 9.8 11/25/2016 1119   WBC 8.2 04/22/2016 1349   RBC 5.14 11/06/2017 1717   RBC 4.57 (A) 11/25/2016 1119   RBC 5.24 04/22/2016 1349   HGB 15.3 11/06/2017 1717   HCT 44.2 11/06/2017 1717   PLT 207 11/06/2017 1717   MCV 86 11/06/2017 1717   MCH 29.8 11/06/2017 1717   MCH 31.5 (A) 11/25/2016 1119   MCH 30.9 04/22/2016 1349   MCHC 34.6 11/06/2017 1717   MCHC 36.1 (A) 11/25/2016 1119   MCHC 34.4 04/22/2016 1349   RDW 14.1 11/06/2017 1717   LYMPHSABS 2.5 11/06/2017 1717   MONOABS 656  04/22/2016 1349   EOSABS 0.2 11/06/2017 1717   BASOSABS 0.0 11/06/2017 1717   Iron/TIBC/Ferritin/ %Sat No results found for: IRON, TIBC, FERRITIN, IRONPCTSAT Lipid Panel     Component Value Date/Time   CHOL 184 10/12/2017 0846   TRIG 71 10/12/2017 0846   HDL 46 10/12/2017 0846   CHOLHDL 4.0 06/03/2017 1130   CHOLHDL 4.4 04/22/2016 1349   VLDL 30 04/22/2016 1349   LDLCALC 124 (H) 10/12/2017  0846   Hepatic Function Panel     Component Value Date/Time   PROT 6.8 11/06/2017 1717   ALBUMIN 4.2 11/06/2017 1717   AST 20 11/06/2017 1717   ALT 28 11/06/2017 1717   ALKPHOS 59 11/06/2017 1717   BILITOT 0.3 11/06/2017 1717      Component Value Date/Time   TSH 2.230 09/18/2016 0956   TSH 2.44 04/22/2016 1349   TSH 1.731 03/09/2015 1524    ASSESSMENT AND PLAN: Type 2 diabetes mellitus without complication, without long-term current use of insulin (HCC) - Plan: Insulin Pen Needle (SURE COMFORT PEN NEEDLES) 32G X 4 MM MISC, glucose blood (BAYER CONTOUR NEXT TEST) test strip, liraglutide (VICTOZA) 18 MG/3ML SOPN  Vitamin D deficiency - Plan: Vitamin D, Ergocalciferol, (DRISDOL) 50000 units CAPS capsule  Essential hypertension - Plan: lisinopril (PRINIVIL,ZESTRIL) 5 MG tablet  At risk for osteoporosis  Class 2 severe obesity with serious comorbidity and body mass index (BMI) of 39.0 to 39.9 in adult, unspecified obesity type ([place])  PLAN:  Diabetes II Loney has been given extensive diabetes education by myself today including ideal fasting and post-prandial blood glucose readings, individual ideal Hgb A1c goals and hypoglycemia prevention. We discussed the importance of good blood sugar control to decrease the likelihood of diabetic complications such as nephropathy, neuropathy, limb loss, blindness, coronary artery disease, and death. We discussed the importance of intensive lifestyle modification including diet, exercise and weight loss as the first line treatment for diabetes. Garan  agrees to continue Victoza 1.5 mg #3 pens and we will refill for 1 month and will will refill pen needles and test strips for 1 month. Raylee agrees to follow up with our clinic in 4 weeks.  Vitamin D Deficiency Anothy was informed that low vitamin D levels contributes to fatigue and are associated with obesity, breast, and colon cancer. Rainier agrees to continue taking prescription Vit D '@50' ,000 IU every week #4 and we will refill for 1 month. He will follow up for routine testing of vitamin D, at least 2-3 times per year. He was informed of the risk of over-replacement of vitamin D and agrees to not increase his dose unless he discusses this with [name] first. Bates agrees to follow up with our clinic in 4 weeks.  At risk for osteopenia and osteoporosis Dmari is at risk for osteopenia and osteoporsis due to his vitamin D deficiency. He was encouraged to take his vitamin D and follow his higher calcium diet and increase strengthening exercise to help strengthen his bones and decrease his risk of osteopenia and osteoporosis.  Hypertension We discussed sodium restriction, working on healthy weight loss, and a regular exercise program as the means to achieve improved blood pressure control. [name] agreed with this plan and agreed to follow up as directed. We will continue to monitor his blood pressure as well as his progress with the above lifestyle modifications. Eason agrees to continue taking lisinopril 5 mg qd #30 and we will refill for 1 month. He will watch for signs of hypotension as he continues his lifestyle modifications. Obert agrees to follow up with our clinic in 4 weeks.  Obesity Skylier is currently in the action stage of change. As such, his goal is to continue with weight loss efforts He has agreed to portion control better and make smarter food choices, such as increase vegetables and decrease simple carbohydrates  Gabryel has been instructed to work up to a goal of 150 minutes of combined cardio  and strengthening exercise  per week for weight loss and overall health benefits. We discussed the following Behavioral Modification Strategies today: increasing lean protein intake and work on meal planning and easy cooking plans   Aurthur has agreed to follow up with our clinic in 4 weeks. He was informed of the importance of frequent follow up visits to maximize his success with intensive lifestyle modifications for his multiple health conditions.   OBESITY BEHAVIORAL INTERVENTION VISIT  Today's visit was # 21 out of 22.  Starting weight: 272 lbs Starting date: 09/18/16 Today's weight : 262 lbs  Today's date: 12/02/2017 Total lbs lost to date: 10 (Patients must lose 7 lbs in the first 6 months to continue with counseling)   ASK: We discussed the diagnosis of obesity with [name] today and [name] agreed to give [name] permission to discuss obesity behavioral modification therapy today.  ASSESS: Davin has the diagnosis of obesity and his BMI today is 39.85 Xzaviar is in the action stage of change   ADVISE: Slayden was educated on the multiple health risks of obesity as well as the benefit of weight loss to improve his health. He was advised of the need for long term treatment and the importance of lifestyle modifications.  AGREE: Multiple dietary modification options and treatment options were discussed and  Sewell agreed to the above obesity treatment plan.   Wilhemena Durie, am acting as transcriptionist for Lacy Duverney, PA-C I, Lacy Duverney [facility], have reviewed this note and agree with its content

## 2017-12-28 ENCOUNTER — Ambulatory Visit (INDEPENDENT_AMBULATORY_CARE_PROVIDER_SITE_OTHER): Payer: BLUE CROSS/BLUE SHIELD | Admitting: Family Medicine

## 2017-12-28 ENCOUNTER — Encounter (INDEPENDENT_AMBULATORY_CARE_PROVIDER_SITE_OTHER): Payer: Self-pay | Admitting: Family Medicine

## 2017-12-28 VITALS — BP 123/78 | HR 80 | Temp 97.8°F | Ht 68.0 in | Wt 263.0 lb

## 2017-12-28 DIAGNOSIS — Z6841 Body Mass Index (BMI) 40.0 and over, adult: Secondary | ICD-10-CM

## 2017-12-28 DIAGNOSIS — Z9189 Other specified personal risk factors, not elsewhere classified: Secondary | ICD-10-CM

## 2017-12-28 DIAGNOSIS — I10 Essential (primary) hypertension: Secondary | ICD-10-CM

## 2017-12-28 DIAGNOSIS — E119 Type 2 diabetes mellitus without complications: Secondary | ICD-10-CM

## 2017-12-28 DIAGNOSIS — E559 Vitamin D deficiency, unspecified: Secondary | ICD-10-CM | POA: Diagnosis not present

## 2017-12-28 MED ORDER — LIRAGLUTIDE 18 MG/3ML ~~LOC~~ SOPN: 1.5000 mg | PEN_INJECTOR | Freq: Every morning | SUBCUTANEOUS | 0 refills | Status: DC

## 2017-12-28 MED ORDER — VITAMIN D (ERGOCALCIFEROL) 1.25 MG (50000 UNIT) PO CAPS: 50000.0000 [IU] | ORAL_CAPSULE | ORAL | 0 refills | Status: DC

## 2017-12-28 MED ORDER — LISINOPRIL 5 MG PO TABS: 5.0000 mg | ORAL_TABLET | Freq: Every day | ORAL | 0 refills | Status: DC

## 2017-12-29 NOTE — Progress Notes (Signed)
 Office: [phone]  /  Fax: [phone]   HPI:   Chief Complaint: OBESITY Steven Hodge is here to discuss his progress with his obesity treatment plan. He is on the portion control better and make smarter food choices plan and is following his eating plan approximately 100 % of the time. He states he is exercising 0 minutes 0 times per week. Steven Hodge had acid reflux last night, so he wasn't feeling a hundred percent today. He has had a few days of nausea accompanied with vomiting. His weight is 263 lb (119.3 kg) today and has had a weight gain of 1 pound over a period of 3 to 4 weeks since his last visit. He has lost 9 lbs since starting treatment with us.  Diabetes II Steven Hodge has a diagnosis of diabetes type II. He has had significant nausea and reflux with the increased dose of Victoza. Steven Hodge states fasting BGs range between 115 and 186 and denies any hypoglycemic episodes. Last A1c was at 6.1 He has been working on intensive lifestyle modifications including diet, exercise, and weight loss to help control his blood glucose levels.  Vitamin D deficiency Steven Hodge has a diagnosis of vitamin D deficiency. He is currently taking vit D and admits fatigue but denies nausea, vomiting or muscle weakness.  At risk for osteopenia and osteoporosis Steven Hodge is at higher risk of osteopenia and osteoporosis due to vitamin D deficiency.   Hypertension Steven Hodge is a 56 y.o. male with hypertension. Steven Hodge denies chest pain, chest Hodge or headache. He is working weight loss to help control his blood Hodge with the goal of decreasing his risk of heart attack and stroke. Steven Hodge is controlled today.  ALLERGIES: No Known Allergies  MEDICATIONS: Current Outpatient Medications on File Prior to Visit  Medication Sig Dispense Refill  . allopurinol (ZYLOPRIM) 300 MG tablet Take 1 tablet (300 mg total) by mouth daily. 90 tablet 1  . aspirin 325 MG tablet Take 325 mg by mouth daily.    .  blood glucose meter kit and supplies Dispense based on patient and insurance preference. Use up to four times daily as directed. (FOR ICD-9 250.00, 250.01). 1 each 0  . Cinnamon 500 MG TABS Take 1,000 mg by mouth daily.    . fish oil-omega-3 fatty acids 1000 MG capsule Take 2 g by mouth daily.    . glucosamine-chondroitin 500-400 MG tablet Take 1 tablet by mouth once.    . glucose blood (BAYER CONTOUR NEXT TEST) test strip USE TO CHECK BLOOD SUGAR UP TO 4 TIMES ADAY 100 each 0  . Insulin Pen Needle (SURE COMFORT PEN NEEDLES) 32G X 4 MM MISC USE FOR VICTOZA INJECTIONS 100 each 0  . Multiple Vitamin (MULTIVITAMIN) tablet Take 1 tablet by mouth daily.    . TURMERIC PO Take 1,000 mg by mouth 2 (two) times daily.     No current facility-administered medications on file prior to visit.     PAST MEDICAL HISTORY: Past Medical History:  Diagnosis Date  . Back pain   . Cancer (HCC) 06/09/2006   Basal cell carcinoma scalp;   . Diabetes mellitus without complication (HCC)   . Hx of blood clots   . Hypertension     PAST SURGICAL HISTORY: Past Surgical History:  Procedure Laterality Date  . basel cell cancer removed  2009  . KNEE SURGERY  2005   ACL repair R    SOCIAL HISTORY: Social History   Tobacco Use  .   Smoking status: Former Research scientist (life sciences)  . Smokeless tobacco: Never Used  Substance Use Topics  . Alcohol use: No  . Drug use: No    FAMILY HISTORY: Family History  Problem Relation Age of Onset  . Diabetes Mother   . Liver disease Mother   . Obesity Mother   . Cancer Father 66       lung cancer  . Diabetes Father   . Cancer Sister 3       lung cancer  . Heart disease Brother 30       cardiac stenting/CAD  . Cancer Brother        skin cancer  . Arthritis Brother     ROS: Review of Systems  Constitutional: Positive for malaise/fatigue. Negative for weight loss.  Cardiovascular: Negative for chest pain.       Negative for chest Hodge  Gastrointestinal: Positive for  heartburn, nausea and vomiting.  Musculoskeletal:       Negative for muscle weakness  Neurological: Negative for headaches.  Endo/Heme/Allergies:       Negative for hypoglycemia    PHYSICAL EXAM: Blood Hodge 123/78, pulse 80, temperature 97.8 F (36.6 C), temperature source Oral, height 5' 8" (1.727 m), weight 263 lb (119.3 kg), SpO2 98 %. Body mass index is 39.99 kg/m. Physical Exam  Constitutional: He is oriented to person, place, and time. He appears well-developed and well-nourished.  Cardiovascular: Normal rate.  Pulmonary/Chest: Effort normal.  Musculoskeletal: Normal range of motion.  Neurological: He is oriented to person, place, and time.  Skin: Skin is warm and dry.  Psychiatric: He has a normal mood and affect. His behavior is normal.  Vitals reviewed.   RECENT LABS AND TESTS: BMET    Component Value Date/Time   NA 141 11/06/2017 1717   K 4.2 11/06/2017 1717   CL 105 11/06/2017 1717   CO2 22 11/06/2017 1717   GLUCOSE 121 (H) 11/06/2017 1717   GLUCOSE 146 (H) 04/22/2016 1349   BUN 26 (H) 11/06/2017 1717   CREATININE 1.03 11/06/2017 1717   CREATININE 0.88 04/22/2016 1349   CALCIUM 9.4 11/06/2017 1717   GFRNONAA 82 11/06/2017 1717   GFRAA 95 11/06/2017 1717   Lab Results  Component Value Date   HGBA1C 6.1 (H) 10/12/2017   HGBA1C 6.4 (H) 06/03/2017   HGBA1C 6.0 (H) 02/12/2017   HGBA1C 6.7 (H) 09/18/2016   HGBA1C 6.7 (H) 08/20/2016   Lab Results  Component Value Date   INSULIN 31.8 (H) 10/12/2017   INSULIN 30.2 (H) 02/12/2017   INSULIN 32.4 (H) 09/18/2016   CBC    Component Value Date/Time   WBC 8.4 11/06/2017 1717   WBC 9.8 11/25/2016 1119   WBC 8.2 04/22/2016 1349   RBC 5.14 11/06/2017 1717   RBC 4.57 (A) 11/25/2016 1119   RBC 5.24 04/22/2016 1349   HGB 15.3 11/06/2017 1717   HCT 44.2 11/06/2017 1717   PLT 207 11/06/2017 1717   MCV 86 11/06/2017 1717   MCH 29.8 11/06/2017 1717   MCH 31.5 (A) 11/25/2016 1119   MCH 30.9 04/22/2016 1349    MCHC 34.6 11/06/2017 1717   MCHC 36.1 (A) 11/25/2016 1119   MCHC 34.4 04/22/2016 1349   RDW 14.1 11/06/2017 1717   LYMPHSABS 2.5 11/06/2017 1717   MONOABS 656 04/22/2016 1349   EOSABS 0.2 11/06/2017 1717   BASOSABS 0.0 11/06/2017 1717   Iron/TIBC/Ferritin/ %Sat No results found for: IRON, TIBC, FERRITIN, IRONPCTSAT Lipid Panel     Component Value Date/Time  CHOL 184 10/12/2017 0846   TRIG 71 10/12/2017 0846   HDL 46 10/12/2017 0846   CHOLHDL 4.0 06/03/2017 1130   CHOLHDL 4.4 04/22/2016 1349   VLDL 30 04/22/2016 1349   LDLCALC 124 (H) 10/12/2017 0846   Hepatic Function Panel     Component Value Date/Time   PROT 6.8 11/06/2017 1717   ALBUMIN 4.2 11/06/2017 1717   AST 20 11/06/2017 1717   ALT 28 11/06/2017 1717   ALKPHOS 59 11/06/2017 1717   BILITOT 0.3 11/06/2017 1717      Component Value Date/Time   TSH 2.230 09/18/2016 0956   TSH 2.44 04/22/2016 1349   TSH 1.731 03/09/2015 1524   Results for Steven Hodge, Steven Hodge (MRN [id number]) as of 12/29/2017 11:00  Ref. Range 10/12/2017 08:46  Vitamin D, 25-Hydroxy Latest Ref Range: 30.0 - 100.0 ng/mL 40.5   ASSESSMENT AND PLAN: Type 2 diabetes mellitus without complication, without long-term current use of insulin (HCC) - Plan: liraglutide (VICTOZA) 18 MG/3ML SOPN  Vitamin D deficiency - Plan: Vitamin D, Ergocalciferol, (DRISDOL) 50000 units CAPS capsule  Essential hypertension - Plan: lisinopril (PRINIVIL,ZESTRIL) 5 MG tablet  At risk for osteoporosis  Class 3 severe obesity with serious comorbidity and body mass index (BMI) of 40.0 to 44.9 in adult, unspecified obesity type (HCC)  PLAN:  Diabetes II Lamond has been given extensive diabetes education by myself today including ideal fasting and post-prandial blood glucose readings, individual ideal Hgb A1c goals and hypoglycemia prevention. We discussed the importance of good blood sugar control to decrease the likelihood of diabetic complications such as nephropathy, neuropathy,  limb loss, blindness, coronary artery disease, and death. We discussed the importance of intensive lifestyle modification including diet, exercise and weight loss as the first line treatment for diabetes. Aydeen agrees to continue liraglutide 1.5 mg subQ qAM #3 pens with no refills and follow up at the agreed upon time.  Vitamin D Deficiency Aleck was informed that low vitamin D levels contributes to fatigue and are associated with obesity, breast, and colon cancer. He agrees to continue to take prescription Vit D @50,000 IU every week #4 with no refills and will follow up for routine testing of vitamin D, at least 2-3 times per year. He was informed of the risk of over-replacement of vitamin D and agrees to not increase his dose unless he discusses this with us first. Eon agrees to follow up as directed.  At risk for osteopenia and osteoporosis Joseluis is at risk for osteopenia and osteoporosis due to his vitamin D deficiency. He was encouraged to take his vitamin D and follow his higher calcium diet and increase strengthening exercise to help strengthen his bones and decrease his risk of osteopenia and osteoporosis.  Hypertension We discussed sodium restriction, working on healthy weight loss, and a regular exercise program as the means to achieve improved blood Hodge control. Buren agreed with this plan and agreed to follow up as directed. We will continue to monitor his blood Hodge as well as his progress with the above lifestyle modifications. He agrees to continue Lisinopril 5 mg qAM #30 with no refills and will watch for signs of hypotension as he continues his lifestyle modifications.  Obesity Trafton is currently in the action stage of change. As such, his goal is to continue with weight loss efforts He has agreed to portion control better and make smarter food choices, such as increase vegetables and decrease simple carbohydrates  Tyree has been instructed to work up to a goal of   150  minutes of combined cardio and strengthening exercise per week for weight loss and overall health benefits. We discussed the following Behavioral Modification Strategies today: planning for success, increasing lean protein intake, increasing vegetables and work on meal planning and easy cooking plans  Christien has agreed to follow up with our clinic in 2 weeks. He was informed of the importance of frequent follow up visits to maximize his success with intensive lifestyle modifications for his multiple health conditions.   OBESITY BEHAVIORAL INTERVENTION VISIT  Today's visit was # 22 out of 22.  Starting weight: 272 lbs Starting date: 09/18/16 Today's weight : 263 lbs Today's date: 12/28/2017 Total lbs lost to date: 9    ASK: We discussed the diagnosis of obesity with Coston Hodge Shirk today and Suhaan agreed to give us permission to discuss obesity behavioral modification therapy today.  ASSESS: Marian has the diagnosis of obesity and his BMI today is 40 [place]lane is in the action stage of change   ADVISE: Jawaan was educated on the multiple health risks of obesity as well as the benefit of weight loss to improve his health. He was advised of the need for long term treatment and the importance of lifestyle modifications.  AGREE: Multiple dietary modification options and treatment options were discussed and  Tayon agreed to the above obesity treatment plan.  I, Joanne Murray, am acting as transcriptionist for  U. Kadolph, MD  I have reviewed the above documentation for accuracy and completeness, and I agree with the above. -  Kadolph, MD  

## 2018-01-25 ENCOUNTER — Ambulatory Visit (INDEPENDENT_AMBULATORY_CARE_PROVIDER_SITE_OTHER): Payer: BLUE CROSS/BLUE SHIELD | Admitting: Family Medicine

## 2018-01-25 VITALS — BP 120/75 | HR 82 | Temp 97.7°F | Ht 68.0 in | Wt 262.0 lb

## 2018-01-25 DIAGNOSIS — E119 Type 2 diabetes mellitus without complications: Secondary | ICD-10-CM | POA: Diagnosis not present

## 2018-01-25 DIAGNOSIS — Z9189 Other specified personal risk factors, not elsewhere classified: Secondary | ICD-10-CM

## 2018-01-25 DIAGNOSIS — E559 Vitamin D deficiency, unspecified: Secondary | ICD-10-CM | POA: Diagnosis not present

## 2018-01-25 DIAGNOSIS — Z6839 Body mass index (BMI) 39.0-39.9, adult: Secondary | ICD-10-CM

## 2018-01-25 DIAGNOSIS — I10 Essential (primary) hypertension: Secondary | ICD-10-CM

## 2018-01-26 MED ORDER — LISINOPRIL 5 MG PO TABS: 5.0000 mg | ORAL_TABLET | Freq: Every day | ORAL | 0 refills | Status: DC

## 2018-01-26 MED ORDER — VITAMIN D (ERGOCALCIFEROL) 1.25 MG (50000 UNIT) PO CAPS: 50000.0000 [IU] | ORAL_CAPSULE | ORAL | 0 refills | Status: DC

## 2018-01-26 MED ORDER — LIRAGLUTIDE 18 MG/3ML ~~LOC~~ SOPN: 1.5000 mg | PEN_INJECTOR | Freq: Every morning | SUBCUTANEOUS | 0 refills | Status: DC

## 2018-01-26 NOTE — Progress Notes (Signed)
Office: [phone]  /  Fax: [phone]   HPI:   Chief Complaint: OBESITY Steven Hodge is here to discuss his progress with his obesity treatment plan. He is on the portion control better and make smarter food choices plan and is following his eating plan approximately 100 % of the time. He states he is exercising 0 minutes 0 times per week. Steven Hodge has noticed an improvement with vomiting and swallowing on Victoza. His weight is 262 lb (118.8 kg) today and has had a weight loss of 1 pound over a period of 4 weeks since his last visit. He has lost 10 lbs since starting treatment with [name].  Vitamin D deficiency Steven Hodge has a diagnosis of vitamin D deficiency. He is currently taking vit D and denies nausea, vomiting or muscle weakness.  At risk for osteopenia and osteoporosis Steven Hodge is at higher risk of osteopenia and osteoporosis due to vitamin D deficiency.   Hypertension Steven Hodge is a 57 y.o. male with hypertension. Steven Hodge denies chest pain, chest pressure or headache. He is working weight loss to help control his blood pressure with the goal of decreasing his risk of heart attack and stroke. Steven Hodge blood pressure is controlled today.  Diabetes II Steven Hodge has a diagnosis of diabetes type II. Steven Hodge has only occasional carb cravings and he denies any hypoglycemic episodes. Steven Hodge found it much easier to get all of the food in with his current dose of Victoza. Last A1c was at 6.1 He has been working on intensive lifestyle modifications including diet, exercise, and weight loss to help control his blood glucose levels.  ALLERGIES: No Known Allergies  MEDICATIONS: Current Outpatient Medications on File Prior to Visit  Medication Sig Dispense Refill  . allopurinol (ZYLOPRIM) 300 MG tablet Take 1 tablet (300 mg total) by mouth daily. 90 tablet 1  . aspirin 325 MG tablet Take 325 mg by mouth daily.    . blood glucose meter kit and supplies Dispense based on patient and insurance preference.  Use up to four times daily as directed. (FOR ICD-9 250.00, 250.01). 1 each 0  . Cinnamon 500 MG TABS Take 1,000 mg by mouth daily.    . fish oil-omega-3 fatty acids 1000 MG capsule Take 2 g by mouth daily.    [name] glucosamine-chondroitin 500-400 MG tablet Take 1 tablet by mouth once.    [name] glucose blood (BAYER CONTOUR NEXT TEST) test strip USE TO CHECK BLOOD SUGAR UP TO 4 TIMES ADAY 100 each 0  . Insulin Pen Needle (SURE COMFORT PEN NEEDLES) 32G X 4 MM MISC USE FOR VICTOZA INJECTIONS 100 each 0  . liraglutide (VICTOZA) 18 MG/3ML SOPN Inject 0.25 mLs (1.5 mg total) into the skin every morning. 3 pen 0  . lisinopril (PRINIVIL,ZESTRIL) 5 MG tablet Take 1 tablet (5 mg total) by mouth daily. 30 tablet 0  . Multiple Vitamin (MULTIVITAMIN) tablet Take 1 tablet by mouth daily.    . TURMERIC PO Take 1,000 mg by mouth 2 (two) times daily.    . Vitamin D, Ergocalciferol, (DRISDOL) 50000 units CAPS capsule Take 1 capsule (50,000 Units total) by mouth every 7 (seven) days. 4 capsule 0   No current facility-administered medications on file prior to visit.     PAST MEDICAL HISTORY: Past Medical History:  Diagnosis Date  . Back pain   . Cancer ([place]) 06/09/2006   Basal cell carcinoma scalp;   [name] Diabetes mellitus without complication ([place])   . Hx of blood clots   .  Hypertension     PAST SURGICAL HISTORY: Past Surgical History:  Procedure Laterality Date  . basel cell cancer removed  2009  . KNEE SURGERY  2005   ACL repair R    SOCIAL HISTORY: Social History   Tobacco Use  . Smoking status: Former Research scientist (life sciences)  . Smokeless tobacco: Never Used  Substance Use Topics  . Alcohol use: No  . Drug use: No    FAMILY HISTORY: Family History  Problem Relation Age of Onset  . Diabetes Mother   . Liver disease Mother   . Obesity Mother   . Cancer Father 7       lung cancer  . Diabetes Father   . Cancer Sister 77       lung cancer  . Heart disease Brother 70       cardiac stenting/CAD  . Cancer  Brother        skin cancer  . Arthritis Brother     ROS: Review of Systems  Constitutional: Positive for weight loss.  Cardiovascular: Negative for chest pain.       Negative for chest pressure  Gastrointestinal: Negative for nausea and vomiting.  Musculoskeletal:       Negative for muscle weakness  Neurological: Negative for headaches.  Endo/Heme/Allergies:       Positive for carb cravings Negative for hypoglycemia    PHYSICAL EXAM: Blood pressure 120/75, pulse 82, temperature 97.7 F (36.5 C), temperature source Oral, height '5\' 8"'$  (1.727 m), weight 262 lb (118.8 kg), SpO2 97 %. Body mass index is 39.84 kg/m. Physical Exam  Constitutional: He is oriented to person, place, and time. He appears well-developed and well-nourished.  Cardiovascular: Normal rate.  Pulmonary/Chest: Effort normal.  Musculoskeletal: Normal range of motion.  Neurological: He is oriented to person, place, and time.  Skin: Skin is warm and dry.  Psychiatric: He has a normal mood and affect. His behavior is normal.  Vitals reviewed.   RECENT LABS AND TESTS: BMET    Component Value Date/Time   NA 141 11/06/2017 1717   K 4.2 11/06/2017 1717   CL 105 11/06/2017 1717   CO2 22 11/06/2017 1717   GLUCOSE 121 (H) 11/06/2017 1717   GLUCOSE 146 (H) 04/22/2016 1349   BUN 26 (H) 11/06/2017 1717   CREATININE 1.03 11/06/2017 1717   CREATININE 0.88 04/22/2016 1349   CALCIUM 9.4 11/06/2017 1717   GFRNONAA 82 11/06/2017 1717   GFRAA 95 11/06/2017 1717   Lab Results  Component Value Date   HGBA1C 6.1 (H) 10/12/2017   HGBA1C 6.4 (H) 06/03/2017   HGBA1C 6.0 (H) 02/12/2017   HGBA1C 6.7 (H) 09/18/2016   HGBA1C 6.7 (H) 08/20/2016   Lab Results  Component Value Date   INSULIN 31.8 (H) 10/12/2017   INSULIN 30.2 (H) 02/12/2017   INSULIN 32.4 (H) 09/18/2016   CBC    Component Value Date/Time   WBC 8.4 11/06/2017 1717   WBC 9.8 11/25/2016 1119   WBC 8.2 04/22/2016 1349   RBC 5.14 11/06/2017 1717   RBC  4.57 (A) 11/25/2016 1119   RBC 5.24 04/22/2016 1349   HGB 15.3 11/06/2017 1717   HCT 44.2 11/06/2017 1717   PLT 207 11/06/2017 1717   MCV 86 11/06/2017 1717   MCH 29.8 11/06/2017 1717   MCH 31.5 (A) 11/25/2016 1119   MCH 30.9 04/22/2016 1349   MCHC 34.6 11/06/2017 1717   MCHC 36.1 (A) 11/25/2016 1119   MCHC 34.4 04/22/2016 1349   RDW 14.1 11/06/2017 1717  LYMPHSABS 2.5 11/06/2017 1717   MONOABS 656 04/22/2016 1349   EOSABS 0.2 11/06/2017 1717   BASOSABS 0.0 11/06/2017 1717   Iron/TIBC/Ferritin/ %Sat No results found for: IRON, TIBC, FERRITIN, IRONPCTSAT Lipid Panel     Component Value Date/Time   CHOL 184 10/12/2017 0846   TRIG 71 10/12/2017 0846   HDL 46 10/12/2017 0846   CHOLHDL 4.0 06/03/2017 1130   CHOLHDL 4.4 04/22/2016 1349   VLDL 30 04/22/2016 1349   LDLCALC 124 (H) 10/12/2017 0846   Hepatic Function Panel     Component Value Date/Time   PROT 6.8 11/06/2017 1717   ALBUMIN 4.2 11/06/2017 1717   AST 20 11/06/2017 1717   ALT 28 11/06/2017 1717   ALKPHOS 59 11/06/2017 1717   BILITOT 0.3 11/06/2017 1717      Component Value Date/Time   TSH 2.230 09/18/2016 0956   TSH 2.44 04/22/2016 1349   TSH 1.731 03/09/2015 1524   Results for MONISH, HALIBURTON (MRN [id number]) as of 01/26/2018 08:18  Ref. Range 10/12/2017 08:46  Vitamin D, 25-Hydroxy Latest Ref Range: 30.0 - 100.0 ng/mL 40.5   ASSESSMENT AND PLAN: Vitamin D deficiency - Plan: Vitamin D, Ergocalciferol, (DRISDOL) 50000 units CAPS capsule  Essential hypertension - Plan: lisinopril (PRINIVIL,ZESTRIL) 5 MG tablet  Type 2 diabetes mellitus without complication, without long-term current use of insulin (HCC) - Plan: liraglutide (VICTOZA) 18 MG/3ML SOPN  At risk for osteoporosis  Class 2 severe obesity with serious comorbidity and body mass index (BMI) of 39.0 to 39.9 in adult, unspecified obesity type ([place])  PLAN:  Vitamin D Deficiency Kaveh was informed that low vitamin D levels contributes to fatigue and  are associated with obesity, breast, and colon cancer. He agrees to continue to take prescription Vit D '@50'$ ,000 IU every week #4 with no refills and will follow up for routine testing of vitamin D, at least 2-3 times per year. He was informed of the risk of over-replacement of vitamin D and agrees to not increase his dose unless he discusses this with [name] first. Finlee agrees to follow up as directed.  At risk for osteopenia and osteoporosis Steven Hodge is at risk for osteopenia and osteoporosis due to his vitamin D deficiency. He was encouraged to take his vitamin D and follow his higher calcium diet and increase strengthening exercise to help strengthen his bones and decrease his risk of osteopenia and osteoporosis.  Hypertension We discussed sodium restriction, working on healthy weight loss, and a regular exercise program as the means to achieve improved blood pressure control. Steven Hodge agreed with this plan and agreed to follow up as directed. We will continue to monitor his blood pressure as well as his progress with the above lifestyle modifications. He agrees to continue Lisinopril 5 mg daily #30 with no refills and will watch for signs of hypotension as he continues his lifestyle modifications.  Diabetes II Steven Hodge has been given extensive diabetes education by myself today including ideal fasting and post-prandial blood glucose readings, individual ideal Hgb A1c goals and hypoglycemia prevention. We discussed the importance of good blood sugar control to decrease the likelihood of diabetic complications such as nephropathy, neuropathy, limb loss, blindness, coronary artery disease, and death. We discussed the importance of intensive lifestyle modification including diet, exercise and weight loss as the first line treatment for diabetes. Steven Hodge agrees to continue Victoza 1.5 mg subQ qAM #3 pens with no refills and follow up at the agreed upon time.  Obesity Steven Hodge is currently in the action stage  of  change. As such, his goal is to continue with weight loss efforts He has agreed to follow the Category 3 plan Steven Hodge has been instructed to work up to a goal of 150 minutes of combined cardio and strengthening exercise per week for weight loss and overall health benefits. We discussed the following Behavioral Modification Strategies today: planning for success, increasing lean protein intake, increasing vegetables and work on meal planning and easy cooking plans  Steven Hodge has agreed to follow up with our clinic in 4 weeks. He was informed of the importance of frequent follow up visits to maximize his success with intensive lifestyle modifications for his multiple health conditions.   OBESITY BEHAVIORAL INTERVENTION VISIT  Today's visit was # 23  Starting weight: 272 lbs Starting date: 09/18/16 Today's weight : 262 lbs Today's date: 01/25/2018 Total lbs lost to date: 10    ASK: We discussed the diagnosis of obesity with Steven Hodge today and Steven Hodge agreed to give [name] permission to discuss obesity behavioral modification therapy today.  ASSESS: Steven Hodge has the diagnosis of obesity and his BMI today is 39.85 Steven Hodge is in the action stage of change   ADVISE: Steven Hodge was educated on the multiple health risks of obesity as well as the benefit of weight loss to improve his health. He was advised of the need for long term treatment and the importance of lifestyle modifications.  AGREE: Multiple dietary modification options and treatment options were discussed and  Steven Hodge agreed to the above obesity treatment plan.  I, Doreene Nest, am acting as transcriptionist for Eber Jones, MD  I have reviewed the above documentation for accuracy and completeness, and I agree with the above. - Ilene Qua, MD

## 2018-01-27 ENCOUNTER — Ambulatory Visit: Payer: BLUE CROSS/BLUE SHIELD | Admitting: Family Medicine

## 2018-01-27 ENCOUNTER — Other Ambulatory Visit: Payer: Self-pay

## 2018-01-27 ENCOUNTER — Encounter: Payer: Self-pay | Admitting: Family Medicine

## 2018-01-27 VITALS — BP 124/72 | HR 85 | Temp 98.4°F | Resp 16 | Ht 68.0 in | Wt 265.6 lb

## 2018-01-27 DIAGNOSIS — M1A09X1 Idiopathic chronic gout, multiple sites, with tophus (tophi): Secondary | ICD-10-CM | POA: Diagnosis not present

## 2018-01-27 MED ORDER — ALLOPURINOL 300 MG PO TABS: 300.0000 mg | ORAL_TABLET | Freq: Every day | ORAL | 1 refills | Status: DC

## 2018-01-27 NOTE — Progress Notes (Signed)
 8/21/20195:19 PM  Joshua J Mell 10/16/1960, 57 y.o. male [id number]  Chief Complaint  Patient presents with  . Follow-up    for gout levels     HPI:   Patient is a 57 y.o. male with past medical history significant for DM2, HTN, HLP, vitamin D deficiency, obesity and gout who presents today for followup on gout  Last uric acid level of 7,9 in may 2019 Allopurinol increased to 300mg daily Might have had a little flare up on left foot for 2-3 days but resolved with tylenol and aleve Has had past flare ups in elbows, knees, left foot No major attacks in years  Seeing Dr Beasley/Dr Kadolph for weight loss Eating 100 grams of protein a day Has lost weight, cbgs better, cut back meds Managing co-morbidties at this time  Requesting to see us once a year for CPE and prn   Fall Risk  01/27/2018 11/06/2017 09/28/2017 06/29/2017 06/03/2017  Falls in the past year? No No No No No  Comment - - - - -     Depression screen PHQ 2/9 01/27/2018 11/06/2017 09/28/2017  Decreased Interest 0 0 0  Down, Depressed, Hopeless 0 0 0  PHQ - 2 Score 0 0 0  Altered sleeping - - -  Tired, decreased energy - - -  Change in appetite - - -  Feeling bad or failure about yourself  - - -  Trouble concentrating - - -  Moving slowly or fidgety/restless - - -  Suicidal thoughts - - -  PHQ-9 Score - - -    No Known Allergies  Prior to Admission medications   Medication Sig Start Date End Date Taking? Authorizing Provider  allopurinol (ZYLOPRIM) 300 MG tablet Take 1 tablet (300 mg total) by mouth daily. 12/02/17  Yes Smith, Kristi M, MD  aspirin 325 MG tablet Take 325 mg by mouth daily.   Yes [provider]  blood glucose meter kit and supplies Dispense based on patient and insurance preference. Use up to four times daily as directed. (FOR ICD-9 250.00, 250.01). 02/28/15  Yes Hopper, David H, MD  Cinnamon 500 MG TABS Take 1,000 mg by mouth daily.   Yes [provider]  fish oil-omega-3  fatty acids 1000 MG capsule Take 2 g by mouth daily.   Yes [provider]  glucosamine-chondroitin 500-400 MG tablet Take 1 tablet by mouth once.   Yes [provider]  glucose blood (BAYER CONTOUR NEXT TEST) test strip USE TO CHECK BLOOD SUGAR UP TO 4 TIMES ADAY 12/02/17  Yes Osman, Sahar M, PA-C  Insulin Pen Needle (SURE COMFORT PEN NEEDLES) 32G X 4 MM MISC USE FOR VICTOZA INJECTIONS 12/02/17  Yes Osman, Sahar M, PA-C  liraglutide (VICTOZA) 18 MG/3ML SOPN Inject 0.25 mLs (1.5 mg total) into the skin every morning. 01/26/18  Yes Kadolph, Alexandria U, MD  lisinopril (PRINIVIL,ZESTRIL) 5 MG tablet Take 1 tablet (5 mg total) by mouth daily. 01/26/18  Yes Kadolph, Alexandria U, MD  Multiple Vitamin (MULTIVITAMIN) tablet Take 1 tablet by mouth daily.   Yes [provider]  TURMERIC PO Take 1,000 mg by mouth 2 (two) times daily.   Yes [provider]  Vitamin D, Ergocalciferol, (DRISDOL) 50000 units CAPS capsule Take 1 capsule (50,000 Units total) by mouth every 7 (seven) days. 01/26/18  Yes Kadolph, Alexandria U, MD    Past Medical History:  Diagnosis Date  . Back pain   . Cancer (HCC) 06/09/2006   Basal cell   carcinoma scalp;   . Diabetes mellitus without complication (HCC)   . Hx of blood clots   . Hypertension     Past Surgical History:  Procedure Laterality Date  . basel cell cancer removed  2009  . KNEE SURGERY  2005   ACL repair R    Social History   Tobacco Use  . Smoking status: Former Smoker  . Smokeless tobacco: Never Used  Substance Use Topics  . Alcohol use: No    Family History  Problem Relation Age of Onset  . Diabetes Mother   . Liver disease Mother   . Obesity Mother   . Cancer Father 70       lung cancer  . Diabetes Father   . Cancer Sister 65       lung cancer  . Heart disease Brother 66       cardiac stenting/CAD  . Cancer Brother        skin cancer  . Arthritis Brother     ROS Per hpi  OBJECTIVE:  Blood pressure  124/72, pulse 85, temperature 98.4 F (36.9 C), resp. rate 16, height 5' 8" (1.727 m), weight 265 lb 9.6 oz (120.5 kg), SpO2 97 %. Body mass index is 40.38 kg/m.   Wt Readings from Last 3 Encounters:  01/27/18 265 lb 9.6 oz (120.5 kg)  01/25/18 262 lb (118.8 kg)  12/28/17 263 lb (119.3 kg)   Physical Exam  Constitutional: He is oriented to person, place, and time. He appears well-developed and well-nourished.  HENT:  Head: Normocephalic and atraumatic.  Mouth/Throat: Oropharynx is clear and moist.  Eyes: Pupils are equal, round, and reactive to light. EOM are normal.  Neck: Neck supple.  Pulmonary/Chest: Effort normal.  Neurological: He is alert and oriented to person, place, and time.  Skin: Skin is warm and dry.  Psychiatric: He has a normal mood and affect.  Nursing note and vitals reviewed.    ASSESSMENT and PLAN  1. Idiopathic chronic gout of multiple sites with tophus Checking labs today, medications will be adjusted as needed.  - Uric Acid  Other orders - allopurinol (ZYLOPRIM) 300 MG tablet; Take 1 tablet (300 mg total) by mouth daily.  Return for CPE in Jan 2020.    Irma M Santiago, MD [facility] [address] [place], [place] [address] Ph.  [phone] Fax [phone]   

## 2018-01-27 NOTE — Patient Instructions (Signed)
° ° ° °  If you have lab work done today you will be contacted with your lab results within the next 2 weeks.  If you have not heard from us then please contact us. The fastest way to get your results is to register for My Chart. ° ° °IF you received an x-ray today, you will receive an invoice from [facility]. Please contact [facility] at [phone] with questions or concerns regarding your invoice.  ° °IF you received labwork today, you will receive an invoice from [facility]. Please contact [facility] at [phone] with questions or concerns regarding your invoice.  ° °Our billing staff will not be able to assist you with questions regarding bills from these companies. ° °You will be contacted with the lab results as soon as they are available. The fastest way to get your results is to activate your My Chart account. Instructions are located on the last page of this paperwork. If you have not heard from us regarding the results in 2 weeks, please contact this office. °  ° ° ° °

## 2018-01-28 LAB — URIC ACID: Uric Acid: 5.5 mg/dL (ref 3.7–8.6)

## 2018-02-09 ENCOUNTER — Other Ambulatory Visit (INDEPENDENT_AMBULATORY_CARE_PROVIDER_SITE_OTHER): Payer: Self-pay | Admitting: Family Medicine

## 2018-02-09 DIAGNOSIS — E559 Vitamin D deficiency, unspecified: Secondary | ICD-10-CM

## 2018-02-23 ENCOUNTER — Ambulatory Visit (INDEPENDENT_AMBULATORY_CARE_PROVIDER_SITE_OTHER): Payer: BLUE CROSS/BLUE SHIELD | Admitting: Family Medicine

## 2018-02-23 VITALS — BP 122/75 | HR 73 | Temp 97.5°F | Ht 68.0 in | Wt 265.0 lb

## 2018-02-23 DIAGNOSIS — I10 Essential (primary) hypertension: Secondary | ICD-10-CM

## 2018-02-23 DIAGNOSIS — E119 Type 2 diabetes mellitus without complications: Secondary | ICD-10-CM

## 2018-02-23 DIAGNOSIS — Z6841 Body Mass Index (BMI) 40.0 and over, adult: Secondary | ICD-10-CM

## 2018-02-23 DIAGNOSIS — Z9189 Other specified personal risk factors, not elsewhere classified: Secondary | ICD-10-CM

## 2018-02-23 DIAGNOSIS — E559 Vitamin D deficiency, unspecified: Secondary | ICD-10-CM | POA: Diagnosis not present

## 2018-02-23 MED ORDER — LISINOPRIL 5 MG PO TABS: 5.0000 mg | ORAL_TABLET | Freq: Every day | ORAL | 0 refills | Status: DC

## 2018-02-24 LAB — COMPREHENSIVE METABOLIC PANEL
ALT: 30 IU/L (ref 0–44)
AST: 20 IU/L (ref 0–40)
Albumin/Globulin Ratio: 1.7 (ref 1.2–2.2)
Albumin: 4.3 g/dL (ref 3.5–5.5)
Alkaline Phosphatase: 71 IU/L (ref 39–117)
BUN/Creatinine Ratio: 22 — ABNORMAL HIGH (ref 9–20)
BUN: 19 mg/dL (ref 6–24)
Bilirubin Total: 0.4 mg/dL (ref 0.0–1.2)
CO2: 24 mmol/L (ref 20–29)
Calcium: 9 mg/dL (ref 8.7–10.2)
Chloride: 104 mmol/L (ref 96–106)
Creatinine, Ser: 0.88 mg/dL (ref 0.76–1.27)
GFR calc Af Amer: 113 mL/min/{1.73_m2} (ref >59)
GFR calc non Af Amer: 97 mL/min/{1.73_m2} (ref >59)
Globulin, Total: 2.6 g/dL (ref 1.5–4.5)
Glucose: 151 mg/dL — ABNORMAL HIGH (ref 65–99)
Potassium: 4.7 mmol/L (ref 3.5–5.2)
Sodium: 141 mmol/L (ref 134–144)
Total Protein: 6.9 g/dL (ref 6.0–8.5)

## 2018-02-24 LAB — LIPID PANEL WITH LDL/HDL RATIO
Cholesterol, Total: 197 mg/dL (ref 100–199)
HDL: 49 mg/dL (ref >39)
LDL Calculated: 124 mg/dL — ABNORMAL HIGH (ref 0–99)
LDl/HDL Ratio: 2.5 ratio (ref 0.0–3.6)
Triglycerides: 121 mg/dL (ref 0–149)
VLDL Cholesterol Cal: 24 mg/dL (ref 5–40)

## 2018-02-24 LAB — HEMOGLOBIN A1C
Est. average glucose Bld gHb Est-mCnc: 134 mg/dL
Hgb A1c MFr Bld: 6.3 % — ABNORMAL HIGH (ref 4.8–5.6)

## 2018-02-24 LAB — VITAMIN D 25 HYDROXY (VIT D DEFICIENCY, FRACTURES): Vit D, 25-Hydroxy: 33.4 ng/mL (ref 30.0–100.0)

## 2018-02-24 LAB — INSULIN, RANDOM: INSULIN: 28 u[IU]/mL — ABNORMAL HIGH (ref 2.6–24.9)

## 2018-02-24 NOTE — Progress Notes (Signed)
Office: [phone]  /  Fax: [phone]   HPI:   Chief Complaint: OBESITY Steven Hodge is here to discuss his progress with his obesity treatment plan. He is on the Category 3 plan and is following his eating plan approximately 100 % of the time. 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 Steven Hodge's brothers died in the past week and church member found dead in their house (likely sleep apnea). He denies emotional eating but eating at funeral services.  His weight is 265 lb (120.2 kg) today and has gained 3 pounds since his last visit. He has lost 7 lbs since starting treatment with [name].  Diabetes II Steven Hodge has a diagnosis of diabetes type II. Herson denies carbohydrate cravings, and he is tolerating Victoza. He denies any hypoglycemic episodes. Last A1c was 6.1. He has been working on intensive lifestyle modifications including diet, exercise, and weight loss to help control his blood glucose levels.  Hypertension [name] is a 57 y.o. male with hypertension. Steven Hodge's blood pressure is controlled today. He denies chest pain, chest pressure, or headache. He is working weight loss to help control his blood pressure with the goal of decreasing his risk of heart attack and stroke.   At risk for cardiovascular disease Steven Hodge is at a higher than average risk for cardiovascular disease due to obesity, diabetes II, and hypertension. He currently denies any chest pain.  Vitamin D Deficiency Steven Hodge has a diagnosis of vitamin D deficiency. He is currently taking prescription Vit D. He notes fatigue and denies nausea, vomiting or muscle weakness.  ALLERGIES: No Known Allergies  MEDICATIONS: Current Outpatient Medications on File Prior to Visit  Medication Sig Dispense Refill  . allopurinol (ZYLOPRIM) 300 MG tablet Take 1 tablet (300 mg total) by mouth daily. 90 tablet 1  . aspirin 325 MG tablet Take 325 mg by mouth daily.    . blood glucose meter kit and supplies Dispense based on patient  and insurance preference. Use up to four times daily as directed. (FOR ICD-9 250.00, 250.01). 1 each 0  . Cinnamon 500 MG TABS Take 1,000 mg by mouth daily.    . fish oil-omega-3 fatty acids 1000 MG capsule Take 2 g by mouth daily.    [name] glucosamine-chondroitin 500-400 MG tablet Take 1 tablet by mouth once.    [name] glucose blood (BAYER CONTOUR NEXT TEST) test strip USE TO CHECK BLOOD SUGAR UP TO 4 TIMES ADAY 100 each 0  . Insulin Pen Needle (SURE COMFORT PEN NEEDLES) 32G X 4 MM MISC USE FOR VICTOZA INJECTIONS 100 each 0  . liraglutide (VICTOZA) 18 MG/3ML SOPN Inject 0.25 mLs (1.5 mg total) into the skin every morning. 3 pen 0  . Multiple Vitamin (MULTIVITAMIN) tablet Take 1 tablet by mouth daily.    . TURMERIC PO Take 1,000 mg by mouth 2 (two) times daily.    . Vitamin D, Ergocalciferol, (DRISDOL) 50000 units CAPS capsule Take 1 capsule (50,000 Units total) by mouth every 7 (seven) days. 4 capsule 0   No current facility-administered medications on file prior to visit.     PAST MEDICAL HISTORY: Past Medical History:  Diagnosis Date  . Back pain   . Cancer ([place]) 06/09/2006   Basal cell carcinoma scalp;   [name] Diabetes mellitus without complication ([place])   . Hx of blood clots   . Hypertension     PAST SURGICAL HISTORY: Past Surgical History:  Procedure Laterality Date  . basel cell cancer removed  2009  . KNEE SURGERY  2005   ACL repair R    SOCIAL HISTORY: Social History   Tobacco Use  . Smoking status: Former Research scientist (life sciences)  . Smokeless tobacco: Never Used  Substance Use Topics  . Alcohol use: No  . Drug use: No    FAMILY HISTORY: Family History  Problem Relation Age of Onset  . Diabetes Mother   . Liver disease Mother   . Obesity Mother   . Cancer Father 43       lung cancer  . Diabetes Father   . Cancer Sister 65       lung cancer  . Heart disease Brother 24       cardiac stenting/CAD  . Cancer Brother        skin cancer  . Arthritis Brother     ROS: Review of Systems    Constitutional: Positive for malaise/fatigue. Negative for weight loss.  Cardiovascular: Negative for chest pain.       Negative chest pressure  Gastrointestinal: Negative for nausea and vomiting.  Musculoskeletal:       Negative muscle weakness  Neurological: Negative for headaches.  Endo/Heme/Allergies:       Negative hypoglycemia    PHYSICAL EXAM: Blood pressure 122/75, pulse 73, temperature (!) 97.5 F (36.4 C), temperature source Oral, height 5' 8" (1.727 m), weight 265 lb (120.2 kg), SpO2 98 %. Body mass index is 40.29 kg/m. Physical Exam  Constitutional: He is oriented to person, place, and time. He appears well-developed and well-nourished.  Cardiovascular: Normal rate.  Pulmonary/Chest: Effort normal.  Musculoskeletal: Normal range of motion.  Neurological: He is oriented to person, place, and time.  Skin: Skin is warm and dry.  Psychiatric: He has a normal mood and affect. His behavior is normal.  Vitals reviewed.   RECENT LABS AND TESTS: BMET    Component Value Date/Time   NA 141 02/23/2018 0814   K 4.7 02/23/2018 0814   CL 104 02/23/2018 0814   CO2 24 02/23/2018 0814   GLUCOSE 151 (H) 02/23/2018 0814   GLUCOSE 146 (H) 04/22/2016 1349   BUN 19 02/23/2018 0814   CREATININE 0.88 02/23/2018 0814   CREATININE 0.88 04/22/2016 1349   CALCIUM 9.0 02/23/2018 0814   GFRNONAA 97 02/23/2018 0814   GFRAA 113 02/23/2018 0814   Lab Results  Component Value Date   HGBA1C 6.3 (H) 02/23/2018   HGBA1C 6.1 (H) 10/12/2017   HGBA1C 6.4 (H) 06/03/2017   HGBA1C 6.0 (H) 02/12/2017   HGBA1C 6.7 (H) 09/18/2016   Lab Results  Component Value Date   INSULIN 28.0 (H) 02/23/2018   INSULIN 31.8 (H) 10/12/2017   INSULIN 30.2 (H) 02/12/2017   INSULIN 32.4 (H) 09/18/2016   CBC    Component Value Date/Time   WBC 8.4 11/06/2017 1717   WBC 9.8 11/25/2016 1119   WBC 8.2 04/22/2016 1349   RBC 5.14 11/06/2017 1717   RBC 4.57 (A) 11/25/2016 1119   RBC 5.24 04/22/2016 1349    HGB 15.3 11/06/2017 1717   HCT 44.2 11/06/2017 1717   PLT 207 11/06/2017 1717   MCV 86 11/06/2017 1717   MCH 29.8 11/06/2017 1717   MCH 31.5 (A) 11/25/2016 1119   MCH 30.9 04/22/2016 1349   MCHC 34.6 11/06/2017 1717   MCHC 36.1 (A) 11/25/2016 1119   MCHC 34.4 04/22/2016 1349   RDW 14.1 11/06/2017 1717   LYMPHSABS 2.5 11/06/2017 1717   MONOABS 656 04/22/2016 1349   EOSABS 0.2 11/06/2017 1717  BASOSABS 0.0 11/06/2017 1717   Iron/TIBC/Ferritin/ %Sat No results found for: IRON, TIBC, FERRITIN, IRONPCTSAT Lipid Panel     Component Value Date/Time   CHOL 197 02/23/2018 0814   TRIG 121 02/23/2018 0814   HDL 49 02/23/2018 0814   CHOLHDL 4.0 06/03/2017 1130   CHOLHDL 4.4 04/22/2016 1349   VLDL 30 04/22/2016 1349   LDLCALC 124 (H) 02/23/2018 0814   Hepatic Function Panel     Component Value Date/Time   PROT 6.9 02/23/2018 0814   ALBUMIN 4.3 02/23/2018 0814   AST 20 02/23/2018 0814   ALT 30 02/23/2018 0814   ALKPHOS 71 02/23/2018 0814   BILITOT 0.4 02/23/2018 0814      Component Value Date/Time   TSH 2.230 09/18/2016 0956   TSH 2.44 04/22/2016 1349   TSH 1.731 03/09/2015 1524  Results for ROLLYN, SCIALDONE (MRN [id number]) as of 02/24/2018 09:52  Ref. Range 02/23/2018 08:14  Vitamin D, 25-Hydroxy Latest Ref Range: 30.0 - 100.0 ng/mL 33.4    ASSESSMENT AND PLAN: Type 2 diabetes mellitus without complication, without long-term current use of insulin (HCC) - Plan: Comprehensive metabolic panel, Hemoglobin A1c, Insulin, random  Essential hypertension - Plan: Lipid Panel With LDL/HDL Ratio, lisinopril (PRINIVIL,ZESTRIL) 5 MG tablet  Vitamin D deficiency - Plan: VITAMIN D 25 Hydroxy (Vit-D Deficiency, Fractures)  At risk for heart disease  Class 3 severe obesity with serious comorbidity and body mass index (BMI) of 40.0 to 44.9 in adult, unspecified obesity type ([place])  PLAN:  Diabetes II Rhyse has been given extensive diabetes education by myself today including ideal fasting  and post-prandial blood glucose readings, individual ideal Hgb A1c goals and hypoglycemia prevention. We discussed the importance of good blood sugar control to decrease the likelihood of diabetic complications such as nephropathy, neuropathy, limb loss, blindness, coronary artery disease, and death. We discussed the importance of intensive lifestyle modification including diet, exercise and weight loss as the first line treatment for diabetes. Quintyn agrees to continue his diabetes medications and we will check labs today. Soham agrees to follow up with our clinic in 4 weeks.  Hypertension We discussed sodium restriction, working on healthy weight loss, and a regular exercise program as the means to achieve improved blood pressure control. [name] agreed with this plan and agreed to follow up as directed. We will continue to monitor his blood pressure as well as his progress with the above lifestyle modifications. Jehiel agrees to continue taking lisinopril 5 mg PO daily #30 and we will refill for 1 month. He will watch for signs of hypotension as he continues his lifestyle modifications. We will check labs today and Mohsin agrees to follow up with our clinic in 4 weeks.  Cardiovascular risk counselling Vinicio was given extended (15 minutes) coronary artery disease prevention counseling today. He is 57 y.o. male and has risk factors for heart disease including obesity, diabetes II, and hypertension. We discussed intensive lifestyle modifications today with an emphasis on specific weight loss instructions and strategies. Pt was also informed of the importance of increasing exercise and decreasing saturated fats to help prevent heart disease.  Vitamin D Deficiency Arris was informed that low vitamin D levels contributes to fatigue and are associated with obesity, breast, and colon cancer. Pheonix agrees to continue taking prescription Vit D _0 ,000 IU every week, no refill needed. He will follow up for routine  testing of vitamin D, at least 2-3 times per year. He was informed of the risk of over-replacement of vitamin D  and agrees to not increase his dose unless he discusses this with [name] first. Javin agrees to follow up with our clinic in 4 weeks.  Obesity Jamieon is currently in the action stage of change. As such, his goal is to continue with weight loss efforts He has agreed to follow the Category 3 plan with limited carbohydrates Justine has been instructed to work up to a goal of 150 minutes of combined cardio and strengthening exercise per week for weight loss and overall health benefits. We discussed the following Behavioral Modification Strategies today: increasing lean protein intake, increasing vegetables, work on meal planning and easy cooking plans, and planning for success   Hazen has agreed to follow up with our clinic in 4 weeks. He was informed of the importance of frequent follow up visits to maximize his success with intensive lifestyle modifications for his multiple health conditions.   OBESITY BEHAVIORAL INTERVENTION VISIT  Today's visit was # 24   Starting weight: 272 lbs Starting date: 09/18/16 Today's weight : 265 lbs  Today's date: 02/23/2018 Total lbs lost to date: 7    ASK: We discussed the diagnosis of obesity with [name] today and [name] agreed to give [name] permission to discuss obesity behavioral modification therapy today.  ASSESS: Arch has the diagnosis of obesity and his BMI today is 40.3 [name] is in the action stage of change   ADVISE: Tamarius was educated on the multiple health risks of obesity as well as the benefit of weight loss to improve his health. He was advised of the need for long term treatment and the importance of lifestyle modifications to improve his current health and to decrease his risk of future health problems.  AGREE: Multiple dietary modification options and treatment options were discussed and  Manoj agreed to follow the  recommendations documented in the above note.  ARRANGE: Tonatiuh was educated on the importance of frequent visits to treat obesity as outlined per CMS and USPSTF guidelines and agreed to schedule his next follow up appointment today.  I, Trixie Dredge, am acting as transcriptionist for Ilene Qua, MD  I have reviewed the above documentation for accuracy and completeness, and I agree with the above. - Ilene Qua, MD

## 2018-03-24 ENCOUNTER — Encounter (INDEPENDENT_AMBULATORY_CARE_PROVIDER_SITE_OTHER): Payer: Self-pay | Admitting: Family Medicine

## 2018-03-24 ENCOUNTER — Ambulatory Visit (INDEPENDENT_AMBULATORY_CARE_PROVIDER_SITE_OTHER): Payer: BLUE CROSS/BLUE SHIELD | Admitting: Family Medicine

## 2018-03-24 VITALS — BP 124/75 | HR 80 | Temp 98.0°F | Ht 68.0 in | Wt 271.0 lb

## 2018-03-24 DIAGNOSIS — E119 Type 2 diabetes mellitus without complications: Secondary | ICD-10-CM

## 2018-03-24 DIAGNOSIS — I10 Essential (primary) hypertension: Secondary | ICD-10-CM | POA: Diagnosis not present

## 2018-03-24 DIAGNOSIS — K5909 Other constipation: Secondary | ICD-10-CM

## 2018-03-24 DIAGNOSIS — E7849 Other hyperlipidemia: Secondary | ICD-10-CM

## 2018-03-24 DIAGNOSIS — Z9189 Other specified personal risk factors, not elsewhere classified: Secondary | ICD-10-CM | POA: Diagnosis not present

## 2018-03-24 DIAGNOSIS — E559 Vitamin D deficiency, unspecified: Secondary | ICD-10-CM | POA: Diagnosis not present

## 2018-03-24 DIAGNOSIS — Z6841 Body Mass Index (BMI) 40.0 and over, adult: Secondary | ICD-10-CM

## 2018-03-24 MED ORDER — INSULIN PEN NEEDLE 32G X 4 MM MISC: 0 refills | Status: DC

## 2018-03-24 MED ORDER — METFORMIN HCL 500 MG PO TABS: 500.0000 mg | ORAL_TABLET | Freq: Every day | ORAL | 0 refills | Status: DC

## 2018-03-24 MED ORDER — VITAMIN D (ERGOCALCIFEROL) 1.25 MG (50000 UNIT) PO CAPS: 50000.0000 [IU] | ORAL_CAPSULE | ORAL | 0 refills | Status: DC

## 2018-03-24 MED ORDER — LIRAGLUTIDE 18 MG/3ML ~~LOC~~ SOPN: 1.2000 mg | PEN_INJECTOR | Freq: Every morning | SUBCUTANEOUS | 0 refills | Status: DC

## 2018-03-24 MED ORDER — ATORVASTATIN CALCIUM 10 MG PO TABS: 10.0000 mg | ORAL_TABLET | Freq: Every day | ORAL | 0 refills | Status: DC

## 2018-03-24 MED ORDER — POLYETHYLENE GLYCOL 3350 17 GM/SCOOP PO POWD: 17.0000 g | Freq: Every day | ORAL | 0 refills | Status: DC

## 2018-03-24 MED ORDER — GLUCOSE BLOOD VI STRP: ORAL_STRIP | 0 refills | Status: DC

## 2018-03-24 MED ORDER — LISINOPRIL 5 MG PO TABS: 5.0000 mg | ORAL_TABLET | Freq: Every day | ORAL | 0 refills | Status: DC

## 2018-03-29 NOTE — Progress Notes (Signed)
Office: [phone]  /  Fax: [phone]   HPI:   Chief Complaint: OBESITY Steven Hodge is here to discuss his progress with his obesity treatment plan. He is on the Category 3 plan and is following his eating plan approximately 100 % of the time. He states he is moving barrels of hay everyday. Steven Hodge notes his bowels are not moving. Victoza is limiting how much he can eat or drink. He is focusing on decreasing carbohydrates but getting 100 grams of protein per day.  His weight is 271 lb (122.9 kg) today and has gained 6 pounds since his last visit. He has lost 1 lb since starting treatment with [name].  Constipation Steven Hodge notes constipation for the last few weeks. He states BM are of little volume, 8-10 times per day. He takes daily metamucil. He are hard and painful. He denies hematochezia or melena. He admits to drinking less H20 recently.  Diabetes II Steven Hodge has a diagnosis of diabetes type II. Steven Hodge states fasting BGs range between 136 and 193, Hgb A1c elevated to 6.3, and insulin decreased. He denies any hypoglycemic episodes. He has been working on intensive lifestyle modifications including diet, exercise, and weight loss to help control his blood glucose levels.  Hyperlipidemia Steven Hodge has hyperlipidemia and has been trying to improve his cholesterol levels with intensive lifestyle modification including a low saturated fat diet, exercise and weight loss. He is not on statin, and he is hesitant to try statin secondary to myalgias. He denies any chest pain or claudication.  At risk for cardiovascular disease Steven Hodge is at a higher than average risk for cardiovascular disease due to obesity, diabetes II, and hyperlipidemia. He currently denies any chest pain.  Vitamin D Deficiency Steven Hodge has a diagnosis of vitamin D deficiency. He is currently taking prescription Vit D. He notes fatigue and denies nausea, vomiting or muscle weakness.  ALLERGIES: No Known Allergies  MEDICATIONS: Current  Outpatient Medications on File Prior to Visit  Medication Sig Dispense Refill  . allopurinol (ZYLOPRIM) 300 MG tablet Take 1 tablet (300 mg total) by mouth daily. 90 tablet 1  . aspirin 325 MG tablet Take 325 mg by mouth daily.    . blood glucose meter kit and supplies Dispense based on patient and insurance preference. Use up to four times daily as directed. (FOR ICD-9 250.00, 250.01). 1 each 0  . Cinnamon 500 MG TABS Take 1,000 mg by mouth daily.    . fish oil-omega-3 fatty acids 1000 MG capsule Take 2 g by mouth daily.    [name] glucosamine-chondroitin 500-400 MG tablet Take 1 tablet by mouth once.    . Multiple Vitamin (MULTIVITAMIN) tablet Take 1 tablet by mouth daily.    . TURMERIC PO Take 1,000 mg by mouth 2 (two) times daily.     No current facility-administered medications on file prior to visit.     PAST MEDICAL HISTORY: Past Medical History:  Diagnosis Date  . Back pain   . Cancer ([place]) 06/09/2006   Basal cell carcinoma scalp;   [name] Diabetes mellitus without complication ([place])   . Hx of blood clots   . Hypertension     PAST SURGICAL HISTORY: Past Surgical History:  Procedure Laterality Date  . basel cell cancer removed  2009  . KNEE SURGERY  2005   ACL repair R    SOCIAL HISTORY: Social History   Tobacco Use  . Smoking status: Former Research scientist (life sciences)  . Smokeless tobacco: Never Used  Substance Use Topics  . Alcohol  use: No  . Drug use: No    FAMILY HISTORY: Family History  Problem Relation Age of Onset  . Diabetes Mother   . Liver disease Mother   . Obesity Mother   . Cancer Father 18       lung cancer  . Diabetes Father   . Cancer Sister 94       lung cancer  . Heart disease Brother 19       cardiac stenting/CAD  . Cancer Brother        skin cancer  . Arthritis Brother     ROS: Review of Systems  Constitutional: Positive for malaise/fatigue. Negative for weight loss.  Cardiovascular: Negative for chest pain and claudication.  Gastrointestinal: Positive for  constipation. Negative for melena, nausea and vomiting.       Negative hematochezia  Musculoskeletal: Positive for myalgias.       Negative muscle weakness  Endo/Heme/Allergies:       Negative hypoglycemia    PHYSICAL EXAM: Blood pressure 124/75, pulse 80, temperature 98 F (36.7 C), temperature source Oral, height '5\' 8"'$  (1.727 m), weight 271 lb (122.9 kg), SpO2 97 %. Body mass index is 41.21 kg/m. Physical Exam  Constitutional: He is oriented to person, place, and time. He appears well-developed and well-nourished.  Cardiovascular: Normal rate.  Pulmonary/Chest: Effort normal.  Musculoskeletal: Normal range of motion.  Neurological: He is oriented to person, place, and time.  Skin: Skin is warm and dry.  Psychiatric: He has a normal mood and affect. His behavior is normal.  Vitals reviewed.   RECENT LABS AND TESTS: BMET    Component Value Date/Time   NA 141 02/23/2018 0814   K 4.7 02/23/2018 0814   CL 104 02/23/2018 0814   CO2 24 02/23/2018 0814   GLUCOSE 151 (H) 02/23/2018 0814   GLUCOSE 146 (H) 04/22/2016 1349   BUN 19 02/23/2018 0814   CREATININE 0.88 02/23/2018 0814   CREATININE 0.88 04/22/2016 1349   CALCIUM 9.0 02/23/2018 0814   GFRNONAA 97 02/23/2018 0814   GFRAA 113 02/23/2018 0814   Lab Results  Component Value Date   HGBA1C 6.3 (H) 02/23/2018   HGBA1C 6.1 (H) 10/12/2017   HGBA1C 6.4 (H) 06/03/2017   HGBA1C 6.0 (H) 02/12/2017   HGBA1C 6.7 (H) 09/18/2016   Lab Results  Component Value Date   INSULIN 28.0 (H) 02/23/2018   INSULIN 31.8 (H) 10/12/2017   INSULIN 30.2 (H) 02/12/2017   INSULIN 32.4 (H) 09/18/2016   CBC    Component Value Date/Time   WBC 8.4 11/06/2017 1717   WBC 9.8 11/25/2016 1119   WBC 8.2 04/22/2016 1349   RBC 5.14 11/06/2017 1717   RBC 4.57 (A) 11/25/2016 1119   RBC 5.24 04/22/2016 1349   HGB 15.3 11/06/2017 1717   HCT 44.2 11/06/2017 1717   PLT 207 11/06/2017 1717   MCV 86 11/06/2017 1717   MCH 29.8 11/06/2017 1717   MCH  31.5 (A) 11/25/2016 1119   MCH 30.9 04/22/2016 1349   MCHC 34.6 11/06/2017 1717   MCHC 36.1 (A) 11/25/2016 1119   MCHC 34.4 04/22/2016 1349   RDW 14.1 11/06/2017 1717   LYMPHSABS 2.5 11/06/2017 1717   MONOABS 656 04/22/2016 1349   EOSABS 0.2 11/06/2017 1717   BASOSABS 0.0 11/06/2017 1717   Iron/TIBC/Ferritin/ %Sat No results found for: IRON, TIBC, FERRITIN, IRONPCTSAT Lipid Panel     Component Value Date/Time   CHOL 197 02/23/2018 0814   TRIG 121 02/23/2018 0814   HDL  49 02/23/2018 0814   CHOLHDL 4.0 06/03/2017 1130   CHOLHDL 4.4 04/22/2016 1349   VLDL 30 04/22/2016 1349   LDLCALC 124 (H) 02/23/2018 0814   Hepatic Function Panel     Component Value Date/Time   PROT 6.9 02/23/2018 0814   ALBUMIN 4.3 02/23/2018 0814   AST 20 02/23/2018 0814   ALT 30 02/23/2018 0814   ALKPHOS 71 02/23/2018 0814   BILITOT 0.4 02/23/2018 0814      Component Value Date/Time   TSH 2.230 09/18/2016 0956   TSH 2.44 04/22/2016 1349   TSH 1.731 03/09/2015 1524  Results for DAVIEON, STOCKHAM (MRN [id number]) as of 03/29/2018 09:22  Ref. Range 02/23/2018 08:14  Vitamin D, 25-Hydroxy Latest Ref Range: 30.0 - 100.0 ng/mL 33.4    ASSESSMENT AND PLAN: Type 2 diabetes mellitus without complication, without long-term current use of insulin (HCC) - Plan: liraglutide (VICTOZA) 18 MG/3ML SOPN, Insulin Pen Needle (SURE COMFORT PEN NEEDLES) 32G X 4 MM MISC, glucose blood (BAYER CONTOUR NEXT TEST) test strip, metFORMIN (GLUCOPHAGE) 500 MG tablet  Essential hypertension - Plan: lisinopril (PRINIVIL,ZESTRIL) 5 MG tablet  Vitamin D deficiency - Plan: Vitamin D, Ergocalciferol, (DRISDOL) 50000 units CAPS capsule  Other hyperlipidemia - Plan: atorvastatin (LIPITOR) 10 MG tablet  Other constipation - Plan: polyethylene glycol powder (GLYCOLAX/MIRALAX) powder  At risk for heart disease  Class 3 severe obesity with serious comorbidity and body mass index (BMI) of 40.0 to 44.9 in adult, unspecified obesity type  (HCC)  PLAN:  Constipation Steven Hodge was informed decrease bowel movement frequency is normal while losing weight, but stools should not be hard or painful. He was advised to increase his H20 intake and work on increasing his fiber intake. High fiber foods were discussed today. Steven Hodge is to try miralax 17g PO daily, if no BM then start docusate. Steven Hodge agrees to follow up with our clinic in 4 weeks.  Diabetes II Steven Hodge has been given extensive diabetes education by myself today including ideal fasting and post-prandial blood glucose readings, individual ideal Hgb A1c goals and hypoglycemia prevention. We discussed the importance of good blood sugar control to decrease the likelihood of diabetic complications such as nephropathy, neuropathy, limb loss, blindness, coronary artery disease, and death. We discussed the importance of intensive lifestyle modification including diet, exercise and weight loss as the first line treatment for diabetes. Steven Hodge agrees to decrease Victoza to 1.2 mg SubQ q AM #2 pens with no refills, and we will refill insulin pen needles and glucose test strips. Steven Hodge agrees to continue taking lisinopril 5 mg PO daily #30 and we will refill for 1 month, and he agrees to start metformin 500 mg PO q AM #30 with no refills. Steven Hodge agrees to follow up with our clinic in 4 weeks.  Hyperlipidemia Steven Hodge was informed of the American Heart Association Guidelines emphasizing intensive lifestyle modifications as the first line treatment for hyperlipidemia. We discussed many lifestyle modifications today in depth, and Tarell will continue to work on decreasing saturated fats such as fatty red meat, butter and many fried foods. He will also increase vegetables and lean protein in his diet and continue to work on exercise and weight loss efforts. Steven Hodge agrees to start Lipitor 10 mg 1 tablet PO qhs #30 with no refills. Steven Hodge agrees to follow up with our clinic in 4 weeks.  Cardiovascular risk  counselling Steven Hodge was given extended (15 minutes) coronary artery disease prevention counseling today. He is 57 y.o. male and has risk factors for heart  disease including obesity, diabetes II, and hyperlipidemia. We discussed intensive lifestyle modifications today with an emphasis on specific weight loss instructions and strategies. Pt was also informed of the importance of increasing exercise and decreasing saturated fats to help prevent heart disease.  Vitamin D Deficiency Steven Hodge was informed that low vitamin D levels contributes to fatigue and are associated with obesity, breast, and colon cancer. Steven Hodge agrees to continue taking prescription Vit D _0 ,000 IU every week #4 and and we will refill for 1 month. He will follow up for routine testing of vitamin D, at least 2-3 times per year. He was informed of the risk of over-replacement of vitamin D and agrees to not increase his dose unless he discusses this with [name] first. Steven Hodge agrees to follow up with our clinic in 4 weeks.  Obesity Steven Hodge is currently in the action stage of change. As such, his goal is to continue with weight loss efforts He has agreed to follow the Category 3 plan Steven Hodge has been instructed to work up to a goal of 150 minutes of combined cardio and strengthening exercise per week for weight loss and overall health benefits. We discussed the following Behavioral Modification Strategies today: decreasing simple carbohydrates, increasing vegetables, increase H20 intake, work on meal planning and easy cooking plans, and planning for success   Steven Hodge has agreed to follow up with our clinic in 4 weeks. He was informed of the importance of frequent follow up visits to maximize his success with intensive lifestyle modifications for his multiple health conditions.   OBESITY BEHAVIORAL INTERVENTION VISIT  Today's visit was # 25   Starting weight: 272 lbs Starting date: 09/18/16 Today's weight : 271 lbs Today's date: 03/24/2018 Total  lbs lost to date: 1    ASK: We discussed the diagnosis of obesity with [name] today and [name] agreed to give [name] permission to discuss obesity behavioral modification therapy today.  ASSESS: Marti has the diagnosis of obesity and his BMI today is 41.22 Makarios is in the action stage of change   ADVISE: Eulon was educated on the multiple health risks of obesity as well as the benefit of weight loss to improve his health. He was advised of the need for long term treatment and the importance of lifestyle modifications to improve his current health and to decrease his risk of future health problems.  AGREE: Multiple dietary modification options and treatment options were discussed and  Deundra agreed to follow the recommendations documented in the above note.  ARRANGE: Treveon was educated on the importance of frequent visits to treat obesity as outlined per CMS and USPSTF guidelines and agreed to schedule his next follow up appointment today.  I, Trixie Dredge, am acting as transcriptionist for Ilene Qua, MD  I have reviewed the above documentation for accuracy and completeness, and I agree with the above. - Ilene Qua, MD

## 2018-04-05 ENCOUNTER — Encounter (INDEPENDENT_AMBULATORY_CARE_PROVIDER_SITE_OTHER): Payer: Self-pay | Admitting: Family Medicine

## 2018-04-21 ENCOUNTER — Ambulatory Visit (INDEPENDENT_AMBULATORY_CARE_PROVIDER_SITE_OTHER): Payer: Self-pay | Admitting: Family Medicine

## 2018-04-22 ENCOUNTER — Ambulatory Visit (INDEPENDENT_AMBULATORY_CARE_PROVIDER_SITE_OTHER): Payer: BLUE CROSS/BLUE SHIELD | Admitting: Family Medicine

## 2018-04-22 VITALS — BP 143/77 | HR 75 | Temp 97.8°F | Ht 68.0 in | Wt 272.0 lb

## 2018-04-22 DIAGNOSIS — Z6841 Body Mass Index (BMI) 40.0 and over, adult: Secondary | ICD-10-CM

## 2018-04-22 DIAGNOSIS — E11649 Type 2 diabetes mellitus with hypoglycemia without coma: Secondary | ICD-10-CM

## 2018-04-22 DIAGNOSIS — E559 Vitamin D deficiency, unspecified: Secondary | ICD-10-CM | POA: Diagnosis not present

## 2018-04-22 DIAGNOSIS — Z9189 Other specified personal risk factors, not elsewhere classified: Secondary | ICD-10-CM

## 2018-04-22 MED ORDER — METFORMIN HCL 500 MG PO TABS: 500.0000 mg | ORAL_TABLET | Freq: Two times a day (BID) | ORAL | 0 refills | Status: DC

## 2018-04-22 MED ORDER — VITAMIN D (ERGOCALCIFEROL) 1.25 MG (50000 UNIT) PO CAPS: 50000.0000 [IU] | ORAL_CAPSULE | ORAL | 0 refills | Status: DC

## 2018-04-22 MED ORDER — ATORVASTATIN CALCIUM 10 MG PO TABS: 10.0000 mg | ORAL_TABLET | Freq: Every day | ORAL | 0 refills | Status: DC

## 2018-04-22 MED ORDER — LIRAGLUTIDE 18 MG/3ML ~~LOC~~ SOPN: 1.2000 mg | PEN_INJECTOR | Freq: Every morning | SUBCUTANEOUS | 0 refills | Status: DC

## 2018-04-22 MED ORDER — LISINOPRIL 5 MG PO TABS: 5.0000 mg | ORAL_TABLET | Freq: Every day | ORAL | 0 refills | Status: DC

## 2018-04-26 ENCOUNTER — Other Ambulatory Visit (INDEPENDENT_AMBULATORY_CARE_PROVIDER_SITE_OTHER): Payer: Self-pay | Admitting: Family Medicine

## 2018-04-26 DIAGNOSIS — E119 Type 2 diabetes mellitus without complications: Secondary | ICD-10-CM

## 2018-04-27 NOTE — Progress Notes (Signed)
Office: [phone]  /  Fax: [phone]   HPI:   Chief Complaint: OBESITY Steven Hodge is here to discuss his progress with his obesity treatment plan. He is on the Category 3 plan and is following his eating plan approximately 100 % of the time. He states he is exercising 0 minutes 0 times per week. Steven Hodge had a lengthy vacation recently and did well focusing on protein intake and limiting carbs. 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  His weight is 272 lb (123.4 kg) today and has had a weight gain of 1 pound over a period of 4 weeks since his last visit. He has lost 0 lbs since starting treatment with [name].  Diabetes II with Hyperglycemia, without use of Insulin Steven Hodge has a diagnosis of diabetes type II. Steven Hodge states that his fasting BGs range between 127 and 165. The days his fasting blood sugar was in the 160's, was when he was sick. His last A1c was 6.3 on 02/23/18. He is taking Victoza and denies GI side effects.  Vitamin D deficiency Steven Hodge has a diagnosis of vitamin D deficiency. He is currently taking vit D. He admits fatigue that is improving and denies nausea, vomiting, or muscle weakness.  At risk for osteopenia and osteoporosis Steven Hodge is at higher risk of osteopenia and osteoporosis due to vitamin D deficiency.   ALLERGIES: No Known Allergies  MEDICATIONS: Current Outpatient Medications on File Prior to Visit  Medication Sig Dispense Refill  . allopurinol (ZYLOPRIM) 300 MG tablet Take 1 tablet (300 mg total) by mouth daily. 90 tablet 1  . aspirin 325 MG tablet Take 325 mg by mouth daily.    . blood glucose meter kit and supplies Dispense based on patient and insurance preference. Use up to four times daily as directed. (FOR ICD-9 250.00, 250.01). 1 each 0  . Cinnamon 500 MG TABS Take 1,000 mg by mouth daily.    . fish oil-omega-3 fatty acids 1000 MG capsule Take 2 g by mouth daily.    [name] glucosamine-chondroitin 500-400 MG tablet Take 1 tablet by mouth once.      . Insulin Pen Needle (SURE COMFORT PEN NEEDLES) 32G X 4 MM MISC USE FOR VICTOZA INJECTIONS 100 each 0  . Multiple Vitamin (MULTIVITAMIN) tablet Take 1 tablet by mouth daily.    . polyethylene glycol powder (GLYCOLAX/MIRALAX) powder Take 17 g by mouth daily. 3350 g 0  . TURMERIC PO Take 1,000 mg by mouth 2 (two) times daily.     No current facility-administered medications on file prior to visit.     PAST MEDICAL HISTORY: Past Medical History:  Diagnosis Date  . Back pain   . Cancer ([place]) 06/09/2006   Basal cell carcinoma scalp;   [name] Diabetes mellitus without complication ([place])   . Hx of blood clots   . Hypertension     PAST SURGICAL HISTORY: Past Surgical History:  Procedure Laterality Date  . basel cell cancer removed  2009  . KNEE SURGERY  2005   ACL repair R    SOCIAL HISTORY: Social History   Tobacco Use  . Smoking status: Former Research scientist (life sciences)  . Smokeless tobacco: Never Used  Substance Use Topics  . Alcohol use: No  . Drug use: No    FAMILY HISTORY: Family History  Problem Relation Age of Onset  . Diabetes Mother   . Liver disease Mother   . Obesity Mother   . Cancer Father 89  lung cancer  . Diabetes Father   . Cancer Sister 77       lung cancer  . Heart disease Brother 95       cardiac stenting/CAD  . Cancer Brother        skin cancer  . Arthritis Brother     ROS: Review of Systems  Constitutional: Positive for malaise/fatigue. Negative for weight loss.  Gastrointestinal: Negative for nausea and vomiting.  Musculoskeletal:       Negative for muscle weakness.  Endo/Heme/Allergies:       Positive for hyperglycemia.    PHYSICAL EXAM: Blood pressure (!) 143/77, pulse 75, temperature 97.8 F (36.6 C), temperature source Oral, height _0  (1.727 m), weight 272 lb (123.4 kg), SpO2 98 %. Body mass index is 41.36 kg/m. Physical Exam  Constitutional: He is oriented to person, place, and time. He appears well-developed and well-nourished.   Cardiovascular: Normal rate.  Pulmonary/Chest: Effort normal.  Musculoskeletal: Normal range of motion.  Neurological: He is oriented to person, place, and time.  Skin: Skin is warm and dry.  Psychiatric: He has a normal mood and affect. His behavior is normal.  Vitals reviewed.   RECENT LABS AND TESTS: BMET    Component Value Date/Time   NA 141 02/23/2018 0814   K 4.7 02/23/2018 0814   CL 104 02/23/2018 0814   CO2 24 02/23/2018 0814   GLUCOSE 151 (H) 02/23/2018 0814   GLUCOSE 146 (H) 04/22/2016 1349   BUN 19 02/23/2018 0814   CREATININE 0.88 02/23/2018 0814   CREATININE 0.88 04/22/2016 1349   CALCIUM 9.0 02/23/2018 0814   GFRNONAA 97 02/23/2018 0814   GFRAA 113 02/23/2018 0814   Lab Results  Component Value Date   HGBA1C 6.3 (H) 02/23/2018   HGBA1C 6.1 (H) 10/12/2017   HGBA1C 6.4 (H) 06/03/2017   HGBA1C 6.0 (H) 02/12/2017   HGBA1C 6.7 (H) 09/18/2016   Lab Results  Component Value Date   INSULIN 28.0 (H) 02/23/2018   INSULIN 31.8 (H) 10/12/2017   INSULIN 30.2 (H) 02/12/2017   INSULIN 32.4 (H) 09/18/2016   CBC    Component Value Date/Time   WBC 8.4 11/06/2017 1717   WBC 9.8 11/25/2016 1119   WBC 8.2 04/22/2016 1349   RBC 5.14 11/06/2017 1717   RBC 4.57 (A) 11/25/2016 1119   RBC 5.24 04/22/2016 1349   HGB 15.3 11/06/2017 1717   HCT 44.2 11/06/2017 1717   PLT 207 11/06/2017 1717   MCV 86 11/06/2017 1717   MCH 29.8 11/06/2017 1717   MCH 31.5 (A) 11/25/2016 1119   MCH 30.9 04/22/2016 1349   MCHC 34.6 11/06/2017 1717   MCHC 36.1 (A) 11/25/2016 1119   MCHC 34.4 04/22/2016 1349   RDW 14.1 11/06/2017 1717   LYMPHSABS 2.5 11/06/2017 1717   MONOABS 656 04/22/2016 1349   EOSABS 0.2 11/06/2017 1717   BASOSABS 0.0 11/06/2017 1717   Iron/TIBC/Ferritin/ %Sat No results found for: IRON, TIBC, FERRITIN, IRONPCTSAT Lipid Panel     Component Value Date/Time   CHOL 197 02/23/2018 0814   TRIG 121 02/23/2018 0814   HDL 49 02/23/2018 0814   CHOLHDL 4.0 06/03/2017  1130   CHOLHDL 4.4 04/22/2016 1349   VLDL 30 04/22/2016 1349   LDLCALC 124 (H) 02/23/2018 0814   Hepatic Function Panel     Component Value Date/Time   PROT 6.9 02/23/2018 0814   ALBUMIN 4.3 02/23/2018 0814   AST 20 02/23/2018 0814   ALT 30 02/23/2018 0814   ALKPHOS  71 02/23/2018 0814   BILITOT 0.4 02/23/2018 0814      Component Value Date/Time   TSH 2.230 09/18/2016 0956   TSH 2.44 04/22/2016 1349   TSH 1.731 03/09/2015 1524   Results for TYRONNE, BLANN (MRN [id number]) as of 04/27/2018 07:59  Ref. Range 02/23/2018 08:14  Vitamin D, 25-Hydroxy Latest Ref Range: 30.0 - 100.0 ng/mL 33.4   ASSESSMENT AND PLAN: Controlled type 2 diabetes mellitus with hypoglycemia, without long-term current use of insulin (HCC) - Plan: liraglutide (VICTOZA) 18 MG/3ML SOPN, lisinopril (PRINIVIL,ZESTRIL) 5 MG tablet, atorvastatin (LIPITOR) 10 MG tablet, metFORMIN (GLUCOPHAGE) 500 MG tablet  Vitamin D deficiency - Plan: Vitamin D, Ergocalciferol, (DRISDOL) 1.25 MG (50000 UT) CAPS capsule  At risk for osteoporosis  Class 3 severe obesity with serious comorbidity and body mass index (BMI) of 40.0 to 44.9 in adult, unspecified obesity type ([place])  PLAN:  Diabetes II with Hyperglycemia, without use of Insulin Hulbert has been given extensive diabetes education by myself today including ideal fasting and post-prandial blood glucose readings, individual ideal Hgb A1c goals, and hypoglycemia prevention. We discussed the importance of good blood sugar control to decrease the likelihood of diabetic complications such as nephropathy, neuropathy, limb loss, blindness, coronary artery disease, and death. We discussed the importance of intensive lifestyle modification including diet, exercise and weight loss as the first line treatment for diabetes. Marquarius agrees to continue Victoza 1.43m SubQ daily #3 with no refills, metformin 5058m PO BID #60 with no refills, Lipitor 1089mO daily #30 with no refills, and lisinopril  5mg37m daily #30 with no refills. AlleMinhees that he will follow up at the agreed upon time.  Vitamin D Deficiency AlleDavidmichael informed that low vitamin D levels contributes to fatigue and are associated with obesity, breast, and colon cancer. He agrees to continue to take prescription Vit D _0 ,000 IU every week #4 with no refills and will follow up for routine testing of vitamin D, at least 2-3 times per year. He was informed of the risk of over-replacement of vitamin D and agrees to not increase his dose unless he discusses this with us f[place]ast. AlleNasirees to follow up as directed in 4 weeks.  At risk for osteopenia and osteoporosis AlleMeet given extended (15 minutes) osteoporosis prevention counseling today. AlleJohanthanat risk for osteopenia and osteoporosis due to his vitamin D deficiency. He was encouraged to take his vitamin D and follow his higher calcium diet and increase strengthening exercise to help strengthen his bones and decrease his risk of osteopenia and osteoporosis.  Obesity AlleBraxtincurrently in the action stage of change. As such, his goal is to continue with weight loss efforts. He has not been following Category 3, but instead was doing a high protein diet. He agreed to follow the high protein plan. AlleLabrian been instructed to work up to a goal of 150 minutes of combined cardio and strengthening exercise per week for weight loss and overall health benefits. We discussed the following Behavioral Modification Strategies today: increasing lean protein intake, increasing vegetables, work on meal planning and easy cooking plans, better snacking choices, and planning for success.  AlleBerkeley agreed to follow up with our clinic in 4 weeks. He was informed of the importance of frequent follow up visits to maximize his success with intensive lifestyle modifications for his multiple health conditions.   OBESITY BEHAVIORAL INTERVENTION VISIT  Today's visit was # 26   Starting  weight: 272 lbs Starting date:  09/18/16 Today's weight : Weight: 272 lb (123.4 kg)  Today's date: 04/22/2018 Total lbs lost to date: 0  ASK: We discussed the diagnosis of obesity with [name] today and [name] agreed to give [name] permission to discuss obesity behavioral modification therapy today.  ASSESS: Melik has the diagnosis of obesity and his BMI today is 41.37. Reyli is in the action stage of change.   ADVISE: Gaberial was educated on the multiple health risks of obesity as well as the benefit of weight loss to improve his health. He was advised of the need for long term treatment and the importance of lifestyle modifications to improve his current health and to decrease his risk of future health problems.  AGREE: Multiple dietary modification options and treatment options were discussed and Findlay agreed to follow the recommendations documented in the above note.  ARRANGE: Billyjoe was educated on the importance of frequent visits to treat obesity as outlined per CMS and USPSTF guidelines and agreed to schedule his next follow up appointment today.  I, Marcille Blanco, am acting as Location manager for Eber Jones, MD  I have reviewed the above documentation for accuracy and completeness, and I agree with the above. - Ilene Qua, MD

## 2018-05-25 ENCOUNTER — Ambulatory Visit (INDEPENDENT_AMBULATORY_CARE_PROVIDER_SITE_OTHER): Payer: BLUE CROSS/BLUE SHIELD | Admitting: Family Medicine

## 2018-05-25 ENCOUNTER — Encounter (INDEPENDENT_AMBULATORY_CARE_PROVIDER_SITE_OTHER): Payer: Self-pay

## 2018-05-27 ENCOUNTER — Ambulatory Visit (INDEPENDENT_AMBULATORY_CARE_PROVIDER_SITE_OTHER): Payer: BLUE CROSS/BLUE SHIELD | Admitting: Family Medicine

## 2018-05-27 ENCOUNTER — Encounter (INDEPENDENT_AMBULATORY_CARE_PROVIDER_SITE_OTHER): Payer: Self-pay | Admitting: Family Medicine

## 2018-05-27 VITALS — BP 139/80 | HR 74 | Temp 97.8°F | Ht 68.0 in | Wt 271.0 lb

## 2018-05-27 DIAGNOSIS — E11649 Type 2 diabetes mellitus with hypoglycemia without coma: Secondary | ICD-10-CM | POA: Diagnosis not present

## 2018-05-27 DIAGNOSIS — Z6841 Body Mass Index (BMI) 40.0 and over, adult: Secondary | ICD-10-CM

## 2018-05-27 DIAGNOSIS — E559 Vitamin D deficiency, unspecified: Secondary | ICD-10-CM | POA: Diagnosis not present

## 2018-05-27 DIAGNOSIS — Z9189 Other specified personal risk factors, not elsewhere classified: Secondary | ICD-10-CM | POA: Diagnosis not present

## 2018-05-27 DIAGNOSIS — E1165 Type 2 diabetes mellitus with hyperglycemia: Secondary | ICD-10-CM

## 2018-05-27 MED ORDER — VITAMIN D (ERGOCALCIFEROL) 1.25 MG (50000 UNIT) PO CAPS: 50000.0000 [IU] | ORAL_CAPSULE | ORAL | 0 refills | Status: DC

## 2018-05-27 MED ORDER — METFORMIN HCL 500 MG PO TABS: 500.0000 mg | ORAL_TABLET | Freq: Two times a day (BID) | ORAL | 0 refills | Status: DC

## 2018-05-27 MED ORDER — LISINOPRIL 5 MG PO TABS: 5.0000 mg | ORAL_TABLET | Freq: Every day | ORAL | 0 refills | Status: DC

## 2018-05-27 MED ORDER — LIRAGLUTIDE 18 MG/3ML ~~LOC~~ SOPN: 1.2000 mg | PEN_INJECTOR | Freq: Every morning | SUBCUTANEOUS | 0 refills | Status: DC

## 2018-05-27 NOTE — Progress Notes (Signed)
Office: [phone]  /  Fax: [phone]   HPI:   Chief Complaint: OBESITY Steven Hodge is here to discuss his progress with his obesity treatment plan. He is on the Category 3 plan and is following his eating plan approximately 100 % of the time. He states he is exercising 0 minutes 0 times per week. Steven Hodge has had a very severe upper respiratory infection in the past weeks, as well as numerous GI illnesses. He has been intermittently taking in all food per plan.  His weight is 271 lb (122.9 kg) today and has had a weight loss of 1 pound over a period of 5 weeks since his last visit. He has lost 1 lbs since starting treatment with [name].  Diabetes II with Hyperglycemia Steven Hodge has a diagnosis of diabetes type II. [name] is still taking Victoza 1.2 mg daily and he is on metformin BID. He recently started fiber gummies and has had 2 to 3 per day. Last A1c was 6.3. He has been working on intensive lifestyle modifications including diet, exercise, and weight loss to help control his blood glucose levels.  Vitamin D Deficiency Steven Hodge has a diagnosis of vitamin D deficiency. He is currently taking prescription Vit D. Steven Hodge notes fatigue and denies nausea, vomiting or muscle weakness.  At risk for osteopenia and osteoporosis Steven Hodge is at higher risk of osteopenia and osteoporosis due to vitamin D deficiency.   ASSESSMENT AND PLAN:  Type 2 diabetes mellitus with hyperglycemia, without long-term current use of insulin (HCC)  Vitamin D deficiency - Plan: Vitamin D, Ergocalciferol, (DRISDOL) 1.25 MG (50000 UT) CAPS capsule  Controlled type 2 diabetes mellitus with hypoglycemia, without long-term current use of insulin (HCC) - Plan: liraglutide (VICTOZA) 18 MG/3ML SOPN, lisinopril (PRINIVIL,ZESTRIL) 5 MG tablet, metFORMIN (GLUCOPHAGE) 500 MG tablet  At risk for osteoporosis  Class 3 severe obesity with serious comorbidity and body mass index (BMI) of 40.0 to 44.9 in adult, unspecified obesity type  ([place])  PLAN:  Diabetes II with Hyperglycemia Steven Hodge has been given extensive diabetes education by myself today including ideal fasting and post-prandial blood glucose readings, individual ideal Hgb A1c goals and hypoglycemia prevention. We discussed the importance of good blood sugar control to decrease the likelihood of diabetic complications such as nephropathy, neuropathy, limb loss, blindness, coronary artery disease, and death. We discussed the importance of intensive lifestyle modification including diet, exercise and weight loss as the first line treatment for diabetes. Steven Hodge agrees to continue taking lisinopril 5 mg PO daily and we will refill for 1 month, he agrees to continue taking Victoza 1.2 mg SubQ daily #3 pens and we will refill for 1 month. [name] agrees to increase metformin to 500 mg 2 tablets PO q AM and 1 tablet PO q PM #90 with no refills. We will recheck labs at next visit. [name] agrees to follow up with our clinic in 4 weeks.  Vitamin D Deficiency Steven Hodge was informed that low vitamin D levels contributes to fatigue and are associated with obesity, breast, and colon cancer. Steven Hodge agrees to continue taking prescription Vit D '@50' ,000 IU every week #4 and we will refill for 1 month. He will follow up for routine testing of vitamin D, at least 2-3 times per year. He was informed of the risk of over-replacement of vitamin D and agrees to not increase his dose unless he discusses this with [name] first. We will recheck labs at next visit. Steven Hodge agrees to follow up with our clinic in 4 weeks.  At  risk for osteopenia and osteoporosis Steven Hodge was given extended (15 minutes) osteoporosis prevention counseling today. Steven Hodge is at risk for osteopenia and osteoporsis due to his vitamin D deficiency. He was encouraged to take his vitamin D and follow his higher calcium diet and increase strengthening exercise to help strengthen his bones and decrease his risk of osteopenia and  osteoporosis.  Obesity Steven Hodge is currently in the action stage of change. As such, his goal is to continue with weight loss efforts He has agreed to follow the Category 3 plan Steven Hodge has been instructed to work up to a goal of 150 minutes of combined cardio and strengthening exercise per week for weight loss and overall health benefits. We discussed the following Behavioral Modification Strategies today: increasing lean protein intake, increasing vegetables, work on meal planning and easy cooking plans, celebration eating strategies, and planning for success   Steven Hodge has agreed to follow up with our clinic in 4 weeks. He was informed of the importance of frequent follow up visits to maximize his success with intensive lifestyle modifications for his multiple health conditions.  ALLERGIES: No Known Allergies  MEDICATIONS: Current Outpatient Medications on File Prior to Visit  Medication Sig Dispense Refill  . allopurinol (ZYLOPRIM) 300 MG tablet Take 1 tablet (300 mg total) by mouth daily. 90 tablet 1  . aspirin 325 MG tablet Take 325 mg by mouth daily.    Steven Hodge atorvastatin (LIPITOR) 10 MG tablet Take 1 tablet (10 mg total) by mouth daily. 30 tablet 0  . blood glucose meter kit and supplies Dispense based on patient and insurance preference. Use up to four times daily as directed. (FOR ICD-9 250.00, 250.01). 1 each 0  . Cinnamon 500 MG TABS Take 1,000 mg by mouth daily.    . CONTOUR NEXT TEST test strip USE TO CHECK BLOOD SUGAR UP TO 4 TIMES ADAY 100 each 0  . fish oil-omega-3 fatty acids 1000 MG capsule Take 2 g by mouth daily.    Steven Hodge glucosamine-chondroitin 500-400 MG tablet Take 1 tablet by mouth once.    . Insulin Pen Needle (SURE COMFORT PEN NEEDLES) 32G X 4 MM MISC USE FOR VICTOZA INJECTIONS 100 each 0  . Multiple Vitamin (MULTIVITAMIN) tablet Take 1 tablet by mouth daily.    . polyethylene glycol powder (GLYCOLAX/MIRALAX) powder Take 17 g by mouth daily. 3350 g 0  . TURMERIC PO Take 1,000  mg by mouth 2 (two) times daily.     No current facility-administered medications on file prior to visit.     PAST MEDICAL HISTORY: Past Medical History:  Diagnosis Date  . Back pain   . Cancer ([place]) 06/09/2006   Basal cell carcinoma scalp;   Steven Hodge Diabetes mellitus without complication ([place])   . Hx of blood clots   . Hypertension     PAST SURGICAL HISTORY: Past Surgical History:  Procedure Laterality Date  . basel cell cancer removed  2009  . KNEE SURGERY  2005   ACL repair R    SOCIAL HISTORY: Social History   Tobacco Use  . Smoking status: Former Research scientist (life sciences)  . Smokeless tobacco: Never Used  Substance Use Topics  . Alcohol use: No  . Drug use: No    FAMILY HISTORY: Family History  Problem Relation Age of Onset  . Diabetes Mother   . Liver disease Mother   . Obesity Mother   . Cancer Father 19       lung cancer  . Diabetes Father   . Cancer  Sister 37       lung cancer  . Heart disease Brother 59       cardiac stenting/CAD  . Cancer Brother        skin cancer  . Arthritis Brother     ROS: Review of Systems  Constitutional: Positive for malaise/fatigue and weight loss.  Gastrointestinal: Negative for nausea and vomiting.  Musculoskeletal:       Negative muscle weakness  Endo/Heme/Allergies:       Negative hypoglycemia    PHYSICAL EXAM: Blood pressure 139/80, pulse 74, temperature 97.8 F (36.6 C), temperature source Oral, height '5\' 8"'  (1.727 m), weight 271 lb (122.9 kg), SpO2 98 %. Body mass index is 41.21 kg/m. Physical Exam Vitals signs reviewed.  Constitutional:      Appearance: Normal appearance. He is obese.  Cardiovascular:     Rate and Rhythm: Normal rate.     Pulses: Normal pulses.  Pulmonary:     Effort: Pulmonary effort is normal.  Musculoskeletal: Normal range of motion.  Skin:    General: Skin is warm and dry.  Neurological:     Mental Status: He is alert and oriented to person, place, and time.  Psychiatric:        Mood and Affect:  Mood normal.        Behavior: Behavior normal.     RECENT LABS AND TESTS: BMET    Component Value Date/Time   NA 141 02/23/2018 0814   K 4.7 02/23/2018 0814   CL 104 02/23/2018 0814   CO2 24 02/23/2018 0814   GLUCOSE 151 (H) 02/23/2018 0814   GLUCOSE 146 (H) 04/22/2016 1349   BUN 19 02/23/2018 0814   CREATININE 0.88 02/23/2018 0814   CREATININE 0.88 04/22/2016 1349   CALCIUM 9.0 02/23/2018 0814   GFRNONAA 97 02/23/2018 0814   GFRAA 113 02/23/2018 0814   Lab Results  Component Value Date   HGBA1C 6.3 (H) 02/23/2018   HGBA1C 6.1 (H) 10/12/2017   HGBA1C 6.4 (H) 06/03/2017   HGBA1C 6.0 (H) 02/12/2017   HGBA1C 6.7 (H) 09/18/2016   Lab Results  Component Value Date   INSULIN 28.0 (H) 02/23/2018   INSULIN 31.8 (H) 10/12/2017   INSULIN 30.2 (H) 02/12/2017   INSULIN 32.4 (H) 09/18/2016   CBC    Component Value Date/Time   WBC 8.4 11/06/2017 1717   WBC 9.8 11/25/2016 1119   WBC 8.2 04/22/2016 1349   RBC 5.14 11/06/2017 1717   RBC 4.57 (A) 11/25/2016 1119   RBC 5.24 04/22/2016 1349   HGB 15.3 11/06/2017 1717   HCT 44.2 11/06/2017 1717   PLT 207 11/06/2017 1717   MCV 86 11/06/2017 1717   MCH 29.8 11/06/2017 1717   MCH 31.5 (A) 11/25/2016 1119   MCH 30.9 04/22/2016 1349   MCHC 34.6 11/06/2017 1717   MCHC 36.1 (A) 11/25/2016 1119   MCHC 34.4 04/22/2016 1349   RDW 14.1 11/06/2017 1717   LYMPHSABS 2.5 11/06/2017 1717   MONOABS 656 04/22/2016 1349   EOSABS 0.2 11/06/2017 1717   BASOSABS 0.0 11/06/2017 1717   Iron/TIBC/Ferritin/ %Sat No results found for: IRON, TIBC, FERRITIN, IRONPCTSAT Lipid Panel     Component Value Date/Time   CHOL 197 02/23/2018 0814   TRIG 121 02/23/2018 0814   HDL 49 02/23/2018 0814   CHOLHDL 4.0 06/03/2017 1130   CHOLHDL 4.4 04/22/2016 1349   VLDL 30 04/22/2016 1349   LDLCALC 124 (H) 02/23/2018 0814   Hepatic Function Panel     Component  Value Date/Time   PROT 6.9 02/23/2018 0814   ALBUMIN 4.3 02/23/2018 0814   AST 20 02/23/2018  0814   ALT 30 02/23/2018 0814   ALKPHOS 71 02/23/2018 0814   BILITOT 0.4 02/23/2018 0814      Component Value Date/Time   TSH 2.230 09/18/2016 0956   TSH 2.44 04/22/2016 1349   TSH 1.731 03/09/2015 1524      OBESITY BEHAVIORAL INTERVENTION VISIT  Today's visit was # 27   Starting weight: 272 lbs Starting date: 09/18/16 Today's weight : 271 lbs Today's date: 05/27/2018 Total lbs lost to date: 1    ASK: We discussed the diagnosis of obesity with [name] today and [name] agreed to give [name] permission to discuss obesity behavioral modification therapy today.  ASSESS: Jeremias has the diagnosis of obesity and his BMI today is 41.22 Montario is in the action stage of change   ADVISE: Jamel was educated on the multiple health risks of obesity as well as the benefit of weight loss to improve his health. He was advised of the need for long term treatment and the importance of lifestyle modifications to improve his current health and to decrease his risk of future health problems.  AGREE: Multiple dietary modification options and treatment options were discussed and  Ociel agreed to follow the recommendations documented in the above note.  ARRANGE: Brentin was educated on the importance of frequent visits to treat obesity as outlined per CMS and USPSTF guidelines and agreed to schedule his next follow up appointment today.  I, Trixie Dredge, am acting as transcriptionist for Ilene Qua, MD  I have reviewed the above documentation for accuracy and completeness, and I agree with the above. - Ilene Qua, MD

## 2018-05-28 ENCOUNTER — Other Ambulatory Visit (INDEPENDENT_AMBULATORY_CARE_PROVIDER_SITE_OTHER): Payer: Self-pay | Admitting: Family Medicine

## 2018-05-28 DIAGNOSIS — E11649 Type 2 diabetes mellitus with hypoglycemia without coma: Secondary | ICD-10-CM

## 2018-06-07 ENCOUNTER — Encounter: Payer: BLUE CROSS/BLUE SHIELD | Admitting: Family Medicine

## 2018-06-24 ENCOUNTER — Encounter (INDEPENDENT_AMBULATORY_CARE_PROVIDER_SITE_OTHER): Payer: Self-pay | Admitting: Family Medicine

## 2018-06-24 ENCOUNTER — Ambulatory Visit (INDEPENDENT_AMBULATORY_CARE_PROVIDER_SITE_OTHER): Payer: BLUE CROSS/BLUE SHIELD | Admitting: Family Medicine

## 2018-06-24 VITALS — BP 122/77 | HR 73 | Temp 97.9°F | Ht 68.0 in | Wt 269.0 lb

## 2018-06-24 DIAGNOSIS — Z6841 Body Mass Index (BMI) 40.0 and over, adult: Secondary | ICD-10-CM

## 2018-06-24 DIAGNOSIS — E1165 Type 2 diabetes mellitus with hyperglycemia: Secondary | ICD-10-CM

## 2018-06-24 DIAGNOSIS — E559 Vitamin D deficiency, unspecified: Secondary | ICD-10-CM

## 2018-06-24 DIAGNOSIS — Z9189 Other specified personal risk factors, not elsewhere classified: Secondary | ICD-10-CM | POA: Diagnosis not present

## 2018-06-24 DIAGNOSIS — K5909 Other constipation: Secondary | ICD-10-CM

## 2018-06-24 DIAGNOSIS — E790 Hyperuricemia without signs of inflammatory arthritis and tophaceous disease: Secondary | ICD-10-CM

## 2018-06-24 DIAGNOSIS — E7849 Other hyperlipidemia: Secondary | ICD-10-CM

## 2018-06-24 MED ORDER — SEMAGLUTIDE 3 MG PO TABS: 3.0000 mg | ORAL_TABLET | Freq: Every day | ORAL | 0 refills | Status: DC

## 2018-06-24 MED ORDER — LISINOPRIL 5 MG PO TABS: 5.0000 mg | ORAL_TABLET | Freq: Every day | ORAL | 0 refills | Status: DC

## 2018-06-24 MED ORDER — METFORMIN HCL 500 MG PO TABS: 500.0000 mg | ORAL_TABLET | Freq: Three times a day (TID) | ORAL | 0 refills | Status: DC

## 2018-06-24 MED ORDER — VITAMIN D (ERGOCALCIFEROL) 1.25 MG (50000 UNIT) PO CAPS: 50000.0000 [IU] | ORAL_CAPSULE | ORAL | 0 refills | Status: DC

## 2018-06-24 MED ORDER — ATORVASTATIN CALCIUM 10 MG PO TABS: 10.0000 mg | ORAL_TABLET | Freq: Every day | ORAL | 0 refills | Status: DC

## 2018-06-25 LAB — HEMOGLOBIN A1C
Est. average glucose Bld gHb Est-mCnc: 146 mg/dL
Hgb A1c MFr Bld: 6.7 % — ABNORMAL HIGH (ref 4.8–5.6)

## 2018-06-25 LAB — LIPID PANEL WITH LDL/HDL RATIO
Cholesterol, Total: 146 mg/dL (ref 100–199)
HDL: 45 mg/dL (ref >39)
LDL Calculated: 78 mg/dL (ref 0–99)
LDl/HDL Ratio: 1.7 ratio (ref 0.0–3.6)
Triglycerides: 113 mg/dL (ref 0–149)
VLDL Cholesterol Cal: 23 mg/dL (ref 5–40)

## 2018-06-25 LAB — URIC ACID: Uric Acid: 7 mg/dL (ref 3.7–8.6)

## 2018-06-25 LAB — INSULIN, RANDOM: INSULIN: 26.7 u[IU]/mL — ABNORMAL HIGH (ref 2.6–24.9)

## 2018-06-25 LAB — VITAMIN D 25 HYDROXY (VIT D DEFICIENCY, FRACTURES): Vit D, 25-Hydroxy: 40.3 ng/mL (ref 30.0–100.0)

## 2018-06-25 NOTE — Progress Notes (Signed)
Office: [phone]  /  Fax: [phone]   HPI:   Chief Complaint: OBESITY Steven Hodge is here to discuss his progress with his obesity treatment plan. He is on the Category 3 plan and is following his eating plan approximately 100 % of the time. He states he is walking for 10-15 minutes 4-5 times per week. Toni had a rough 3 weeks prior to the last week. He state most of this week has been of in terms of bloating and feeling constipated. He has to skip meals.  His weight is 269 lb (122 kg) today and has had a weight loss of 2 pounds over a period of 4 weeks since his last visit. He has lost 3 lbs since starting treatment with [name].  Diabetes II with Hyperglycemia Steven Hodge has a diagnosis of diabetes type II. [name] reports BGs have significantly increased previously. He states fasting BGs range between 141 and 211. He denies hypoglycemia. Last A1c was 6.7. He has been working on intensive lifestyle modifications including diet, exercise, and weight loss to help control his blood glucose levels.  Hyperlipidemia Steven Hodge has hyperlipidemia and has been trying to improve his cholesterol levels with intensive lifestyle modification including a low saturated fat diet, exercise and weight loss. He is on statin and denies any chest pain, claudication or myalgias.  Vitamin D Deficiency Steven Hodge has a diagnosis of vitamin D deficiency. He is currently taking prescription Vit D. He notes fatigue and denies nausea, vomiting or muscle weakness.  At risk for osteopenia and osteoporosis Steven Hodge is at higher risk of osteopenia and osteoporosis due to vitamin D deficiency.   Constipation Steven Hodge notes constipation for the last few weeks. He sees Dr. Collene Mares. He is taking miralax and metamucil daily. He denies hematochezia or melena. He denies drinking less H20 recently.  ASSESSMENT AND PLAN:  Type 2 diabetes mellitus with hyperglycemia, without long-term current use of insulin (HCC) - Plan: Hemoglobin A1c, Insulin,  random, Uric acid, metFORMIN (GLUCOPHAGE) 500 MG tablet, Semaglutide (RYBELSUS) 3 MG TABS, lisinopril (PRINIVIL,ZESTRIL) 5 MG tablet  Other hyperlipidemia - Plan: Lipid Panel With LDL/HDL Ratio, Uric acid, atorvastatin (LIPITOR) 10 MG tablet  Vitamin D deficiency - Plan: VITAMIN D 25 Hydroxy (Vit-D Deficiency, Fractures), Vitamin D, Ergocalciferol, (DRISDOL) 1.25 MG (50000 UT) CAPS capsule  Other constipation  At risk for osteoporosis  Class 3 severe obesity with serious comorbidity and body mass index (BMI) of 40.0 to 44.9 in adult, unspecified obesity type ([place])  PLAN:  Diabetes II with Hyperglycemia Steven Hodge has been given extensive diabetes education by myself today including ideal fasting and post-prandial blood glucose readings, individual ideal Hgb A1c goals and hypoglycemia prevention. We discussed the importance of good blood sugar control to decrease the likelihood of diabetic complications such as nephropathy, neuropathy, limb loss, blindness, coronary artery disease, and death. We discussed the importance of intensive lifestyle modification including diet, exercise and weight loss as the first line treatment for diabetes. [name] agrees to stop Victoza and he agrees to start Rybelsus 3 mg PO daily #30 with no refills. He agrees to continue taking metformin 500 mg PO TID #90 and we will refill for 1 month. Steven Hodge agrees to continue taking lisinopril 5 mg PO daily #30 and we will refill for 1 month. We will check Hgb A1c and insulin today. [name] agrees to follow up with our clinic in 4 weeks.  Hyperlipidemia Steven Hodge was informed of the American Heart Association Guidelines emphasizing intensive lifestyle modifications as the first line treatment for hyperlipidemia.  We discussed many lifestyle modifications today in depth, and Steven Hodge will continue to work on decreasing saturated fats such as fatty red meat, butter and many fried foods. Steven Hodge agrees to continue taking atorvastatin 10 mg PO daily #30  and we will refill for 1 month. He will also increase vegetables and lean protein in his diet and continue to work on exercise and weight loss efforts. We will check FLP today. [name] agrees to follow up with our clinic in 4 weeks.  Vitamin D Deficiency Steven Hodge was informed that low vitamin D levels contributes to fatigue and are associated with obesity, breast, and colon cancer. [name] agrees to continue taking prescription Vit D _0 ,000 IU every week #4 and we will refill for 1 month. He will follow up for routine testing of vitamin D, at least 2-3 times per year. He was informed of the risk of over-replacement of vitamin D and agrees to not increase his dose unless he discusses this with [name] first. We will check Vit D level today. Steven Hodge agrees to follow up with our clinic in 4 weeks.  At risk for osteopenia and osteoporosis Steven Hodge was given extended (15 minutes) osteoporosis prevention counseling today. Steven Hodge is at risk for osteopenia and osteoporsis due to his vitamin D deficiency. He was encouraged to take his vitamin D and follow his higher calcium diet and increase strengthening exercise to help strengthen his bones and decrease his risk of osteopenia and osteoporosis.  Constipation Steven Hodge was informed decrease bowel movement frequency is normal while losing weight, but stools should not be hard or painful. He was advised to increase his H20 intake and work on increasing his fiber intake. High fiber foods were discussed today. Steven Hodge is to follow up with Dr. Collene Mares and he agrees to follow up with our clinic in 4 weeks.  Obesity Steven Hodge is currently in the action stage of change. As such, his goal is to continue with weight loss efforts He has agreed to follow the Category 3 plan [name] has been instructed to work up to a goal of 150 minutes of combined cardio and strengthening exercise per week for weight loss and overall health benefits. We discussed the following Behavioral Modification Strategies  today: increasing lean protein intake, increasing vegetables, work on meal planning and easy cooking plans, and planning for success   Steven Hodge has agreed to follow up with our clinic in 4 weeks. He was informed of the importance of frequent follow up visits to maximize his success with intensive lifestyle modifications for his multiple health conditions.  ALLERGIES: No Known Allergies  MEDICATIONS: Current Outpatient Medications on File Prior to Visit  Medication Sig Dispense Refill  . allopurinol (ZYLOPRIM) 300 MG tablet Take 1 tablet (300 mg total) by mouth daily. 90 tablet 1  . aspirin 325 MG tablet Take 325 mg by mouth daily.    . blood glucose meter kit and supplies Dispense based on patient and insurance preference. Use up to four times daily as directed. (FOR ICD-9 250.00, 250.01). 1 each 0  . Cinnamon 500 MG TABS Take 1,000 mg by mouth daily.    . CONTOUR NEXT TEST test strip USE TO CHECK BLOOD SUGAR UP TO 4 TIMES ADAY 100 each 0  . fish oil-omega-3 fatty acids 1000 MG capsule Take 2 g by mouth daily.    [name] glucosamine-chondroitin 500-400 MG tablet Take 1 tablet by mouth once.    . Insulin Pen Needle (SURE COMFORT PEN NEEDLES) 32G X 4 MM MISC USE FOR  VICTOZA INJECTIONS 100 each 0  . Multiple Vitamin (MULTIVITAMIN) tablet Take 1 tablet by mouth daily.    . polyethylene glycol powder (GLYCOLAX/MIRALAX) powder Take 17 g by mouth daily. 3350 g 0  . TURMERIC PO Take 1,000 mg by mouth 2 (two) times daily.     No current facility-administered medications on file prior to visit.     PAST MEDICAL HISTORY: Past Medical History:  Diagnosis Date  . Back pain   . Cancer ([place]) 06/09/2006   Basal cell carcinoma scalp;   [name] Diabetes mellitus without complication ([place])   . Hx of blood clots   . Hypertension     PAST SURGICAL HISTORY: Past Surgical History:  Procedure Laterality Date  . basel cell cancer removed  2009  . KNEE SURGERY  2005   ACL repair R    SOCIAL HISTORY: Social  History   Tobacco Use  . Smoking status: Former Research scientist (life sciences)  . Smokeless tobacco: Never Used  Substance Use Topics  . Alcohol use: No  . Drug use: No    FAMILY HISTORY: Family History  Problem Relation Age of Onset  . Diabetes Mother   . Liver disease Mother   . Obesity Mother   . Cancer Father 90       lung cancer  . Diabetes Father   . Cancer Sister 67       lung cancer  . Heart disease Brother 62       cardiac stenting/CAD  . Cancer Brother        skin cancer  . Arthritis Brother     ROS: Review of Systems  Constitutional: Positive for malaise/fatigue and weight loss.  Cardiovascular: Negative for chest pain and claudication.  Gastrointestinal: Positive for constipation. Negative for melena, nausea and vomiting.       Negative hematochezia  Musculoskeletal: Negative for myalgias.       Negative muscle weakness  Endo/Heme/Allergies:       Negative hypoglycemia    PHYSICAL EXAM: Blood pressure 122/77, pulse 73, temperature 97.9 F (36.6 C), temperature source Oral, height _0  (1.727 m), weight 269 lb (122 kg), SpO2 98 %. Body mass index is 40.9 kg/m. Physical Exam Vitals signs reviewed.  Constitutional:      Appearance: Normal appearance. He is obese.  Cardiovascular:     Rate and Rhythm: Normal rate.     Pulses: Normal pulses.  Pulmonary:     Effort: Pulmonary effort is normal.     Breath sounds: Normal breath sounds.  Musculoskeletal: Normal range of motion.  Skin:    General: Skin is warm and dry.  Neurological:     Mental Status: He is alert and oriented to person, place, and time.  Psychiatric:        Mood and Affect: Mood normal.        Behavior: Behavior normal.     RECENT LABS AND TESTS: BMET    Component Value Date/Time   NA 141 02/23/2018 0814   K 4.7 02/23/2018 0814   CL 104 02/23/2018 0814   CO2 24 02/23/2018 0814   GLUCOSE 151 (H) 02/23/2018 0814   GLUCOSE 146 (H) 04/22/2016 1349   BUN 19 02/23/2018 0814   CREATININE 0.88  02/23/2018 0814   CREATININE 0.88 04/22/2016 1349   CALCIUM 9.0 02/23/2018 0814   GFRNONAA 97 02/23/2018 0814   GFRAA 113 02/23/2018 0814   Lab Results  Component Value Date   HGBA1C 6.7 (H) 06/24/2018   HGBA1C 6.3 (H) 02/23/2018  HGBA1C 6.1 (H) 10/12/2017   HGBA1C 6.4 (H) 06/03/2017   HGBA1C 6.0 (H) 02/12/2017   Lab Results  Component Value Date   INSULIN 26.7 (H) 06/24/2018   INSULIN 28.0 (H) 02/23/2018   INSULIN 31.8 (H) 10/12/2017   INSULIN 30.2 (H) 02/12/2017   INSULIN 32.4 (H) 09/18/2016   CBC    Component Value Date/Time   WBC 8.4 11/06/2017 1717   WBC 9.8 11/25/2016 1119   WBC 8.2 04/22/2016 1349   RBC 5.14 11/06/2017 1717   RBC 4.57 (A) 11/25/2016 1119   RBC 5.24 04/22/2016 1349   HGB 15.3 11/06/2017 1717   HCT 44.2 11/06/2017 1717   PLT 207 11/06/2017 1717   MCV 86 11/06/2017 1717   MCH 29.8 11/06/2017 1717   MCH 31.5 (A) 11/25/2016 1119   MCH 30.9 04/22/2016 1349   MCHC 34.6 11/06/2017 1717   MCHC 36.1 (A) 11/25/2016 1119   MCHC 34.4 04/22/2016 1349   RDW 14.1 11/06/2017 1717   LYMPHSABS 2.5 11/06/2017 1717   MONOABS 656 04/22/2016 1349   EOSABS 0.2 11/06/2017 1717   BASOSABS 0.0 11/06/2017 1717   Iron/TIBC/Ferritin/ %Sat No results found for: IRON, TIBC, FERRITIN, IRONPCTSAT Lipid Panel     Component Value Date/Time   CHOL 146 06/24/2018 0956   TRIG 113 06/24/2018 0956   HDL 45 06/24/2018 0956   CHOLHDL 4.0 06/03/2017 1130   CHOLHDL 4.4 04/22/2016 1349   VLDL 30 04/22/2016 1349   LDLCALC 78 06/24/2018 0956   Hepatic Function Panel     Component Value Date/Time   PROT 6.9 02/23/2018 0814   ALBUMIN 4.3 02/23/2018 0814   AST 20 02/23/2018 0814   ALT 30 02/23/2018 0814   ALKPHOS 71 02/23/2018 0814   BILITOT 0.4 02/23/2018 0814      Component Value Date/Time   TSH 2.230 09/18/2016 0956   TSH 2.44 04/22/2016 1349   TSH 1.731 03/09/2015 1524      OBESITY BEHAVIORAL INTERVENTION VISIT  Today's visit was # 28   Starting weight:  272 lbs Starting date: 09/18/16 Today's weight : 269 lbs  Today's date: 06/24/2018 Total lbs lost to date: 3    ASK: We discussed the diagnosis of obesity with Steven Hodge today and Steven Hodge agreed to give [name] permission to discuss obesity behavioral modification therapy today.  ASSESS: Steven Hodge has the diagnosis of obesity and his BMI today is 40.91 Steven Hodge is in the action stage of change   ADVISE: Steven Hodge was educated on the multiple health risks of obesity as well as the benefit of weight loss to improve his health. He was advised of the need for long term treatment and the importance of lifestyle modifications to improve his current health and to decrease his risk of future health problems.  AGREE: Multiple dietary modification options and treatment options were discussed and  Chase agreed to follow the recommendations documented in the above note.  ARRANGE: Steven Hodge was educated on the importance of frequent visits to treat obesity as outlined per CMS and USPSTF guidelines and agreed to schedule his next follow up appointment today.  I, Trixie Dredge, am acting as transcriptionist for Ilene Qua, MD  I have reviewed the above documentation for accuracy and completeness, and I agree with the above. - Ilene Qua, MD

## 2018-06-29 ENCOUNTER — Telehealth (INDEPENDENT_AMBULATORY_CARE_PROVIDER_SITE_OTHER): Payer: Self-pay | Admitting: Family Medicine

## 2018-06-29 NOTE — Telephone Encounter (Signed)
Patient is requesting Dr. Adair Patter call [facility] regarding the new Rx Rydelsus.  Pt's insurance is not going to cover this med.  Dr. Adair Patter told patient that she could contact the drug rep for assistance. Thank you

## 2018-06-29 NOTE — Telephone Encounter (Signed)
Anderson Malta the rep went to the pharmacy and educated them on how to run the copay card and the medication went through with a  $10 copay. The pharmacy will make the pt aware. Steven Hodge, [place]

## 2018-06-29 NOTE — Telephone Encounter (Signed)
Spoke with the patient and the pharmaceutical rep for that particular medication who is going to the pharmacy in person to handle any issues and will update [name] after.   April, [place]

## 2018-07-01 ENCOUNTER — Encounter: Payer: BLUE CROSS/BLUE SHIELD | Admitting: Family Medicine

## 2018-07-06 ENCOUNTER — Other Ambulatory Visit: Payer: Self-pay

## 2018-07-06 ENCOUNTER — Ambulatory Visit (INDEPENDENT_AMBULATORY_CARE_PROVIDER_SITE_OTHER): Payer: BLUE CROSS/BLUE SHIELD | Admitting: Family Medicine

## 2018-07-06 ENCOUNTER — Encounter: Payer: Self-pay | Admitting: Family Medicine

## 2018-07-06 VITALS — BP 135/80 | HR 76 | Temp 98.6°F | Resp 17 | Ht 68.0 in | Wt 278.2 lb

## 2018-07-06 DIAGNOSIS — M5431 Sciatica, right side: Secondary | ICD-10-CM

## 2018-07-06 DIAGNOSIS — I10 Essential (primary) hypertension: Secondary | ICD-10-CM | POA: Diagnosis not present

## 2018-07-06 DIAGNOSIS — Z0000 Encounter for general adult medical examination without abnormal findings: Secondary | ICD-10-CM

## 2018-07-06 DIAGNOSIS — E7849 Other hyperlipidemia: Secondary | ICD-10-CM | POA: Diagnosis not present

## 2018-07-06 DIAGNOSIS — Z6841 Body Mass Index (BMI) 40.0 and over, adult: Secondary | ICD-10-CM | POA: Diagnosis not present

## 2018-07-06 DIAGNOSIS — Z0001 Encounter for general adult medical examination with abnormal findings: Secondary | ICD-10-CM

## 2018-07-06 DIAGNOSIS — E1165 Type 2 diabetes mellitus with hyperglycemia: Secondary | ICD-10-CM | POA: Diagnosis not present

## 2018-07-06 DIAGNOSIS — Z23 Encounter for immunization: Secondary | ICD-10-CM

## 2018-07-06 MED ORDER — GABAPENTIN 300 MG PO CAPS: 300.0000 mg | ORAL_CAPSULE | Freq: Three times a day (TID) | ORAL | 3 refills | Status: DC

## 2018-07-06 NOTE — Progress Notes (Signed)
1/28/20208:12 AM  Steven Hodge February 12, 1961, 58 y.o. male [id number]  Chief Complaint  Patient presents with  . Annual Exam    cpe    HPI:   Patient is a 58 y.o. male with past medical history significant for DM2, HTN, HLP, obesity, vitamin D deficiency, gout who presents today for CPE  Colorectal Cancer Screening: 2018, Dr Collene Mares, polyps x 2, repeat in 5 years Prostate Cancer Screening: 2018 HIV Screening: 2016 Seasonal Influenza Vaccination: today Td/Tdap Vaccination: 2010 Pneumococcal Vaccination: 2016, next at age 11 Zoster Vaccination: thru [name] once arrives Frequency of Dental evaluation: Q6 months Frequency of Eye evaluation: had eye exam May 2019  Sees medical weight managment every 3 months, they are managing his chronic medical conditions and weight   He had recent flare up of right sciatica, much better now Would like prescription to have on file in case needed  Fall Risk  07/06/2018 01/27/2018 11/06/2017 09/28/2017 06/29/2017  Falls in the past year? 0 No No No No  Comment - - - - -     Depression screen [facility] 2/9 07/06/2018 01/27/2018 11/06/2017  Decreased Interest 0 0 0  Down, Depressed, Hopeless 0 0 0  PHQ - 2 Score 0 0 0  Altered sleeping - - -  Tired, decreased energy - - -  Change in appetite - - -  Feeling bad or failure about yourself  - - -  Trouble concentrating - - -  Moving slowly or fidgety/restless - - -  Suicidal thoughts - - -  PHQ-9 Score - - -    No Known Allergies  Prior to Admission medications   Medication Sig Start Date End Date Taking? Authorizing Provider  allopurinol (ZYLOPRIM) 300 MG tablet Take 1 tablet (300 mg total) by mouth daily. 01/27/18  Yes Rutherford Guys, MD  aspirin 325 MG tablet Take 325 mg by mouth daily.   Yes [provider]  atorvastatin (LIPITOR) 10 MG tablet Take 1 tablet (10 mg total) by mouth daily. 06/24/18  Yes Eber Jones, MD  blood glucose meter kit and supplies Dispense based on patient and  insurance preference. Use up to four times daily as directed. (FOR ICD-9 250.00, 250.01). 02/28/15  Yes Posey Boyer, MD  Cinnamon 500 MG TABS Take 1,000 mg by mouth daily.   Yes [provider]  CONTOUR NEXT TEST test strip USE TO CHECK BLOOD SUGAR UP TO 4 TIMES ADAY 04/26/18  Yes Eber Jones, MD  fish oil-omega-3 fatty acids 1000 MG capsule Take 2 g by mouth daily.   Yes [provider]  glucosamine-chondroitin 500-400 MG tablet Take 1 tablet by mouth once.   Yes [provider]  lisinopril (PRINIVIL,ZESTRIL) 5 MG tablet Take 1 tablet (5 mg total) by mouth daily. 06/24/18  Yes Eber Jones, MD  metFORMIN (GLUCOPHAGE) 500 MG tablet Take 1 tablet (500 mg total) by mouth 3 (three) times daily. 06/24/18  Yes Eber Jones, MD  Multiple Vitamin (MULTIVITAMIN) tablet Take 1 tablet by mouth daily.   Yes [provider]  polyethylene glycol powder (GLYCOLAX/MIRALAX) powder Take 17 g by mouth daily. 03/24/18  Yes Eber Jones, MD  Semaglutide (RYBELSUS) 3 MG TABS Take 3 mg by mouth daily. 06/24/18  Yes Eber Jones, MD  TURMERIC PO Take 1,000 mg by mouth 2 (two) times daily.   Yes [provider]  Vitamin D, Ergocalciferol, (DRISDOL) 1.25 MG (50000 UT) CAPS capsule Take 1 capsule (50,000 Units  total) by mouth every 7 (seven) days. 06/24/18  Yes Eber Jones, MD  Insulin Pen Needle (SURE COMFORT PEN NEEDLES) 32G X 4 MM MISC USE FOR [place] INJECTIONS Patient not taking: Reported on 07/06/2018 03/24/18   Eber Jones, MD    Past Medical History:  Diagnosis Date  . Back pain   . Cancer ([place]) 06/09/2006   Basal cell carcinoma scalp;   [name] Diabetes mellitus without complication ([place])   . Hx of blood clots   . Hypertension     Past Surgical History:  Procedure Laterality Date  . basel cell cancer removed  2009  . KNEE SURGERY  2005   ACL repair R    Social History   Tobacco Use  . Smoking  status: Former Research scientist (life sciences)  . Smokeless tobacco: Never Used  Substance Use Topics  . Alcohol use: No    Family History  Problem Relation Age of Onset  . Diabetes Mother   . Liver disease Mother   . Obesity Mother   . Cancer Father 22       lung cancer  . Diabetes Father   . Cancer Sister 41       lung cancer  . Heart disease Brother 34       cardiac stenting/CAD  . Cancer Brother        skin cancer  . Arthritis Brother     Review of Systems  Constitutional: Negative for chills and fever.  Respiratory: Negative for cough and shortness of breath.   Cardiovascular: Negative for chest pain, palpitations and leg swelling.  Gastrointestinal: Negative for abdominal pain, nausea and vomiting.  Musculoskeletal: Positive for back pain and joint pain.  All other systems reviewed and are negative.    OBJECTIVE:  Blood pressure 135/80, pulse 76, temperature 98.6 F (37 C), temperature source Oral, resp. rate 17, height '5\' 8"'  (1.727 m), weight 278 lb 3.2 oz (126.2 kg), SpO2 98 %. Body mass index is 42.3 kg/m.   Wt Readings from Last 3 Encounters:  07/06/18 278 lb 3.2 oz (126.2 kg)  06/24/18 269 lb (122 kg)  05/27/18 271 lb (122.9 kg)     Visual Acuity Screening   Right eye Left eye Both eyes  Without correction:     With correction: '20/20 20/25 20/20 '    Physical Exam Vitals signs and nursing note reviewed.  Constitutional:      Appearance: He is well-developed.  HENT:     Head: Normocephalic and atraumatic.     Right Ear: Hearing, tympanic membrane, ear canal and external ear normal.     Left Ear: Hearing, tympanic membrane, ear canal and external ear normal.     Mouth/Throat:     Pharynx: No oropharyngeal exudate.  Eyes:     Conjunctiva/sclera: Conjunctivae normal.     Pupils: Pupils are equal, round, and reactive to light.  Neck:     Musculoskeletal: Neck supple.     Thyroid: No thyromegaly.  Cardiovascular:     Rate and Rhythm: Normal rate and regular rhythm.      Heart sounds: Normal heart sounds. No murmur. No friction rub. No gallop.   Pulmonary:     Effort: Pulmonary effort is normal.     Breath sounds: Normal breath sounds. No wheezing, rhonchi or rales.  Abdominal:     General: Bowel sounds are normal. There is no distension.     Palpations: Abdomen is soft. There is no mass.     Tenderness: There is no  abdominal tenderness.  Musculoskeletal: Normal range of motion.  Lymphadenopathy:     Cervical: No cervical adenopathy.  Skin:    General: Skin is warm and dry.  Neurological:     Mental Status: He is alert and oriented to person, place, and time.     Cranial Nerves: No cranial nerve deficit.     Coordination: Coordination normal.     Gait: Gait normal.     Deep Tendon Reflexes: Reflexes are normal and symmetric.     Diabetic Foot Exam - Simple   Simple Foot Form Diabetic Foot exam was performed with the following findings:  Yes 07/06/2018  8:11 AM  Visual Inspection No deformities, no ulcerations, no other skin breakdown bilaterally:  Yes Sensation Testing Intact to touch and monofilament testing bilaterally:  Yes Pulse Check Posterior Tibialis and Dorsalis pulse intact bilaterally:  Yes Comments     ASSESSMENT and PLAN  1. Annual physical exam Routine HCM labs ordered. HCM reviewed/discussed. Anticipatory guidance regarding healthy weight, lifestyle and choices given.   2. Type 2 diabetes mellitus with hyperglycemia, without long-term current use of insulin (HCC) - HM DIABETES FOOT EXAM  3. Other hyperlipidemia  4. Essential hypertension  5. BMI 40.0-44.9, adult ([place])  6. Right sided sciatica Discussed supportive measures, new meds r/se/b and RTC precautions. Patient educational handout given.  7. Need for prophylactic vaccination and inoculation against influenza  Other orders - Flu Vaccine QUAD 36+ mos IM - gabapentin (NEURONTIN) 300 MG capsule; Take 1 capsule (300 mg total) by mouth 3 (three) times daily.     Return in about 1 year (around 07/07/2019).    Rutherford Guys, MD [facility] [place], [place] [address] Ph.  [phone] Fax [phone]

## 2018-07-06 NOTE — Patient Instructions (Signed)
° ° ° °  If you have lab work done today you will be contacted with your lab results within the next 2 weeks.  If you have not heard from us then please contact us. The fastest way to get your results is to register for My Chart. ° ° °IF you received an x-ray today, you will receive an invoice from [facility]. Please contact [facility] at [phone] with questions or concerns regarding your invoice.  ° °IF you received labwork today, you will receive an invoice from [facility]. Please contact [facility] at [phone] with questions or concerns regarding your invoice.  ° °Our billing staff will not be able to assist you with questions regarding bills from these companies. ° °You will be contacted with the lab results as soon as they are available. The fastest way to get your results is to activate your My Chart account. Instructions are located on the last page of this paperwork. If you have not heard from us regarding the results in 2 weeks, please contact this office. °  ° ° ° °

## 2018-07-26 ENCOUNTER — Ambulatory Visit (INDEPENDENT_AMBULATORY_CARE_PROVIDER_SITE_OTHER): Payer: BLUE CROSS/BLUE SHIELD | Admitting: Family Medicine

## 2018-07-26 ENCOUNTER — Encounter (INDEPENDENT_AMBULATORY_CARE_PROVIDER_SITE_OTHER): Payer: Self-pay | Admitting: Family Medicine

## 2018-07-26 ENCOUNTER — Telehealth: Payer: Self-pay | Admitting: *Deleted

## 2018-07-26 VITALS — BP 131/78 | HR 81 | Temp 97.9°F | Ht 68.0 in | Wt 273.0 lb

## 2018-07-26 DIAGNOSIS — E7849 Other hyperlipidemia: Secondary | ICD-10-CM

## 2018-07-26 DIAGNOSIS — Z9189 Other specified personal risk factors, not elsewhere classified: Secondary | ICD-10-CM | POA: Diagnosis not present

## 2018-07-26 DIAGNOSIS — E559 Vitamin D deficiency, unspecified: Secondary | ICD-10-CM | POA: Diagnosis not present

## 2018-07-26 DIAGNOSIS — E1165 Type 2 diabetes mellitus with hyperglycemia: Secondary | ICD-10-CM | POA: Diagnosis not present

## 2018-07-26 DIAGNOSIS — Z6841 Body Mass Index (BMI) 40.0 and over, adult: Secondary | ICD-10-CM | POA: Diagnosis not present

## 2018-07-26 MED ORDER — VITAMIN D (ERGOCALCIFEROL) 1.25 MG (50000 UNIT) PO CAPS: 50000.0000 [IU] | ORAL_CAPSULE | ORAL | 0 refills | Status: DC

## 2018-07-26 MED ORDER — ATORVASTATIN CALCIUM 10 MG PO TABS: 10.0000 mg | ORAL_TABLET | Freq: Every day | ORAL | 0 refills | Status: DC

## 2018-07-26 MED ORDER — METFORMIN HCL 500 MG PO TABS: 500.0000 mg | ORAL_TABLET | Freq: Three times a day (TID) | ORAL | 0 refills | Status: DC

## 2018-07-26 MED ORDER — LISINOPRIL 5 MG PO TABS: 5.0000 mg | ORAL_TABLET | Freq: Every day | ORAL | 0 refills | Status: DC

## 2018-07-26 NOTE — Progress Notes (Signed)
Office: [phone]  /  Fax: [phone]   HPI:   Chief Complaint: OBESITY Steven Hodge is here to discuss his progress with his obesity treatment plan. He is on the Category 3 plan and is following his eating plan approximately 100 % of the time. He states he is walking for 30 minutes 1-2 times per week. Steven Hodge reports significant GI issues with Rybelsus. He struggled to eat almost any food after starting Rybelsus. He stopped Rybelsus after 9 days.  His weight is 273 lb (123.8 kg) today and has gained 4 pounds since his last visit. He has lost 0 lbs since starting treatment with [name].  Diabetes II with Hyperglycemia Steven Hodge has a diagnosis of diabetes type II. Steven Hodge stopped Rybelsus secondary to belly issues (nausea, satiety). Last Hgb A1c was 6.7 (previously 6.3). He has had GI side effects of Victoza. He has been working on intensive lifestyle modifications including diet, exercise, and weight loss to help control his blood glucose levels.  Hyperlipidemia Steven Hodge has hyperlipidemia and has been trying to improve his cholesterol levels with intensive lifestyle modification including a low saturated fat diet, exercise and weight loss. His LDL has significantly improved and HDL of 45. He denies any chest pain, claudication or myalgias.  At risk for cardiovascular disease Steven Hodge is at a higher than average risk for cardiovascular disease due to obesity, diabetes II, and hyperlipidemia. He currently denies any chest pain.  Vitamin D Deficiency Steven Hodge has a diagnosis of vitamin D deficiency. He is currently taking prescription Vit D. He notes fatigue and denies nausea, vomiting or muscle weakness.  ASSESSMENT AND PLAN:  Other hyperlipidemia - Plan: atorvastatin (LIPITOR) 10 MG tablet  Type 2 diabetes mellitus with hyperglycemia, without long-term current use of insulin (HCC) - Plan: lisinopril (PRINIVIL,ZESTRIL) 5 MG tablet, metFORMIN (GLUCOPHAGE) 500 MG tablet  Vitamin D deficiency - Plan: Vitamin D,  Ergocalciferol, (DRISDOL) 1.25 MG (50000 UT) CAPS capsule  At risk for heart disease  Class 3 severe obesity with serious comorbidity and body mass index (BMI) of 40.0 to 44.9 in adult, unspecified obesity type ([place])  PLAN:  Diabetes II with Hyperglycemia Steven Hodge has been given extensive diabetes education by myself today including ideal fasting and post-prandial blood glucose readings, individual ideal Hgb A1c goals and hypoglycemia prevention. We discussed the importance of good blood sugar control to decrease the likelihood of diabetic complications such as nephropathy, neuropathy, limb loss, blindness, coronary artery disease, and death. We discussed the importance of intensive lifestyle modification including diet, exercise and weight loss as the first line treatment for diabetes. Steven Hodge agrees to continue taking metformin 500 mg PO TID #90 and we will refill for 1 month, and he agrees to continue taking lisinopril 5 mg PO daily #30 and we will refill for 1 month. We will retest in 3 months, and he is to consider initiation of SGLT2 at next visit. Steven Hodge agrees to follow up with our clinic in 4 weeks.  Hyperlipidemia Steven Hodge was informed of the American Heart Association Guidelines emphasizing intensive lifestyle modifications as the first line treatment for hyperlipidemia. We discussed many lifestyle modifications today in depth, and Steven Hodge will continue to work on decreasing saturated fats such as fatty red meat, butter and many fried foods. He will also increase vegetables and lean protein in his diet and continue to work on exercise and weight loss efforts. Steven Hodge agrees to continue taking atorvastatin 10 mg PO daily #30 and we will refill for 1 month. Steven Hodge agrees to follow up with  our clinic in 4 weeks.  Cardiovascular risk counseling Steven Hodge was given extended (15 minutes) coronary artery disease prevention counseling today. He is 58 y.o. male and has risk factors for heart disease including  obesity, diabetes II, and hyperlipidemia. We discussed intensive lifestyle modifications today with an emphasis on specific weight loss instructions and strategies. Pt was also informed of the importance of increasing exercise and decreasing saturated fats to help prevent heart disease.  Vitamin D Deficiency Steven Hodge was informed that low vitamin D levels contributes to fatigue and are associated with obesity, breast, and colon cancer. Steven Hodge agrees to continue taking prescription Vit D '@50' ,000 IU every week #4 and we will refill for 1 month. He will follow up for routine testing of vitamin D, at least 2-3 times per year. He was informed of the risk of over-replacement of vitamin D and agrees to not increase his dose unless he discusses this with [name] first. Steven Hodge agrees to follow up with our clinic in 4 weeks.  Obesity Steven Hodge is currently in the action stage of change. As such, his goal is to continue with weight loss efforts He has agreed to follow the Category 3 plan Steven Hodge has been instructed to work up to a goal of 150 minutes of combined cardio and strengthening exercise per week for weight loss and overall health benefits. We discussed the following Behavioral Modification Strategies today: increasing lean protein intake, increasing vegetables, work on meal planning and easy cooking plans, and planning for success   Steven Hodge has agreed to follow up with our clinic in 4 weeks. He was informed of the importance of frequent follow up visits to maximize his success with intensive lifestyle modifications for his multiple health conditions.  ALLERGIES: No Known Allergies  MEDICATIONS: Current Outpatient Medications on File Prior to Visit  Medication Sig Dispense Refill  . allopurinol (ZYLOPRIM) 300 MG tablet Take 1 tablet (300 mg total) by mouth daily. 90 tablet 1  . aspirin 325 MG tablet Take 325 mg by mouth daily.    . blood glucose meter kit and supplies Dispense based on patient and insurance  preference. Use up to four times daily as directed. (FOR ICD-9 250.00, 250.01). 1 each 0  . Cinnamon 500 MG TABS Take 1,000 mg by mouth daily.    . CONTOUR NEXT TEST test strip USE TO CHECK BLOOD SUGAR UP TO 4 TIMES ADAY 100 each 0  . fish oil-omega-3 fatty acids 1000 MG capsule Take 2 g by mouth daily.    [name] gabapentin (NEURONTIN) 300 MG capsule Take 1 capsule (300 mg total) by mouth 3 (three) times daily. 30 capsule 3  . glucosamine-chondroitin 500-400 MG tablet Take 1 tablet by mouth once.    . Insulin Pen Needle (SURE COMFORT PEN NEEDLES) 32G X 4 MM MISC USE FOR VICTOZA INJECTIONS (Patient not taking: Reported on 07/06/2018) 100 each 0  . Multiple Vitamin (MULTIVITAMIN) tablet Take 1 tablet by mouth daily.    . polyethylene glycol powder (GLYCOLAX/MIRALAX) powder Take 17 g by mouth daily. 3350 g 0  . Semaglutide (RYBELSUS) 3 MG TABS Take 3 mg by mouth daily. 30 tablet 0  . TURMERIC PO Take 1,000 mg by mouth 2 (two) times daily.     No current facility-administered medications on file prior to visit.     PAST MEDICAL HISTORY: Past Medical History:  Diagnosis Date  . Back pain   . Cancer ([place]) 06/09/2006   Basal cell carcinoma scalp;   [name] Diabetes mellitus without complication (  [place])   . Hx of blood clots   . Hypertension     PAST SURGICAL HISTORY: Past Surgical History:  Procedure Laterality Date  . basel cell cancer removed  2009  . KNEE SURGERY  2005   ACL repair R    SOCIAL HISTORY: Social History   Tobacco Use  . Smoking status: Former Research scientist (life sciences)  . Smokeless tobacco: Never Used  Substance Use Topics  . Alcohol use: No  . Drug use: No    FAMILY HISTORY: Family History  Problem Relation Age of Onset  . Diabetes Mother   . Liver disease Mother   . Obesity Mother   . Cancer Father 49       lung cancer  . Diabetes Father   . Cancer Sister 58       lung cancer  . Heart disease Brother 71       cardiac stenting/CAD  . Cancer Brother        skin cancer  . Arthritis  Brother     ROS: Review of Systems  Constitutional: Positive for malaise/fatigue. Negative for weight loss.  Cardiovascular: Negative for chest pain and claudication.  Gastrointestinal: Negative for nausea and vomiting.  Musculoskeletal: Negative for myalgias.       Negative muscle weakness  Endo/Heme/Allergies:       Negative hypoglycemia    PHYSICAL EXAM: Blood pressure 131/78, pulse 81, temperature 97.9 F (36.6 C), temperature source Oral, height '5\' 8"'  (1.727 m), weight 273 lb (123.8 kg), SpO2 96 %. Body mass index is 41.51 kg/m. Physical Exam Vitals signs reviewed.  Constitutional:      Appearance: Normal appearance. He is obese.  Cardiovascular:     Rate and Rhythm: Normal rate.     Pulses: Normal pulses.  Pulmonary:     Effort: Pulmonary effort is normal.     Breath sounds: Normal breath sounds.  Musculoskeletal: Normal range of motion.  Skin:    General: Skin is warm and dry.  Neurological:     Mental Status: He is alert and oriented to person, place, and time.  Psychiatric:        Mood and Affect: Mood normal.        Behavior: Behavior normal.     RECENT LABS AND TESTS: BMET    Component Value Date/Time   NA 141 02/23/2018 0814   K 4.7 02/23/2018 0814   CL 104 02/23/2018 0814   CO2 24 02/23/2018 0814   GLUCOSE 151 (H) 02/23/2018 0814   GLUCOSE 146 (H) 04/22/2016 1349   BUN 19 02/23/2018 0814   CREATININE 0.88 02/23/2018 0814   CREATININE 0.88 04/22/2016 1349   CALCIUM 9.0 02/23/2018 0814   GFRNONAA 97 02/23/2018 0814   GFRAA 113 02/23/2018 0814   Lab Results  Component Value Date   HGBA1C 6.7 (H) 06/24/2018   HGBA1C 6.3 (H) 02/23/2018   HGBA1C 6.1 (H) 10/12/2017   HGBA1C 6.4 (H) 06/03/2017   HGBA1C 6.0 (H) 02/12/2017   Lab Results  Component Value Date   INSULIN 26.7 (H) 06/24/2018   INSULIN 28.0 (H) 02/23/2018   INSULIN 31.8 (H) 10/12/2017   INSULIN 30.2 (H) 02/12/2017   INSULIN 32.4 (H) 09/18/2016   CBC    Component Value Date/Time    WBC 8.4 11/06/2017 1717   WBC 9.8 11/25/2016 1119   WBC 8.2 04/22/2016 1349   RBC 5.14 11/06/2017 1717   RBC 4.57 (A) 11/25/2016 1119   RBC 5.24 04/22/2016 1349   HGB 15.3 11/06/2017 1717  HCT 44.2 11/06/2017 1717   PLT 207 11/06/2017 1717   MCV 86 11/06/2017 1717   MCH 29.8 11/06/2017 1717   MCH 31.5 (A) 11/25/2016 1119   MCH 30.9 04/22/2016 1349   MCHC 34.6 11/06/2017 1717   MCHC 36.1 (A) 11/25/2016 1119   MCHC 34.4 04/22/2016 1349   RDW 14.1 11/06/2017 1717   LYMPHSABS 2.5 11/06/2017 1717   MONOABS 656 04/22/2016 1349   EOSABS 0.2 11/06/2017 1717   BASOSABS 0.0 11/06/2017 1717   Iron/TIBC/Ferritin/ %Sat No results found for: IRON, TIBC, FERRITIN, IRONPCTSAT Lipid Panel     Component Value Date/Time   CHOL 146 06/24/2018 0956   TRIG 113 06/24/2018 0956   HDL 45 06/24/2018 0956   CHOLHDL 4.0 06/03/2017 1130   CHOLHDL 4.4 04/22/2016 1349   VLDL 30 04/22/2016 1349   LDLCALC 78 06/24/2018 0956   Hepatic Function Panel     Component Value Date/Time   PROT 6.9 02/23/2018 0814   ALBUMIN 4.3 02/23/2018 0814   AST 20 02/23/2018 0814   ALT 30 02/23/2018 0814   ALKPHOS 71 02/23/2018 0814   BILITOT 0.4 02/23/2018 0814      Component Value Date/Time   TSH 2.230 09/18/2016 0956   TSH 2.44 04/22/2016 1349   TSH 1.731 03/09/2015 1524      OBESITY BEHAVIORAL INTERVENTION VISIT  Today's visit was # 29  Starting weight: 272 lbs Starting date: 09/18/2016 Today's weight : 273 lbs  Today's date: 07/26/2018 Total lbs lost to date: 0    ASK: We discussed the diagnosis of obesity with Steven Hodge today and Steven Hodge agreed to give [name] permission to discuss obesity behavioral modification therapy today.  ASSESS: Steven Hodge has the diagnosis of obesity and his BMI today is 41.52 Steven Hodge is in the action stage of change   ADVISE: Steven Hodge was educated on the multiple health risks of obesity as well as the benefit of weight loss to improve his health. He was advised of the need  for long term treatment and the importance of lifestyle modifications to improve his current health and to decrease his risk of future health problems.  AGREE: Multiple dietary modification options and treatment options were discussed and  Steven Hodge agreed to follow the recommendations documented in the above note.  ARRANGE: Steven Hodge was educated on the importance of frequent visits to treat obesity as outlined per CMS and USPSTF guidelines and agreed to schedule his next follow up appointment today.  I, Trixie Dredge, am acting as transcriptionist for Ilene Qua, MD  I have reviewed the above documentation for accuracy and completeness, and I agree with the above. - Ilene Qua, MD

## 2018-07-26 NOTE — Telephone Encounter (Signed)
Spoke with patient he will be in next week to start Shingrix vaccine

## 2018-08-23 ENCOUNTER — Encounter (INDEPENDENT_AMBULATORY_CARE_PROVIDER_SITE_OTHER): Payer: Self-pay | Admitting: Family Medicine

## 2018-08-23 ENCOUNTER — Ambulatory Visit (INDEPENDENT_AMBULATORY_CARE_PROVIDER_SITE_OTHER): Payer: BLUE CROSS/BLUE SHIELD | Admitting: Family Medicine

## 2018-08-23 ENCOUNTER — Other Ambulatory Visit: Payer: Self-pay

## 2018-08-23 VITALS — BP 135/77 | HR 73 | Temp 97.6°F | Ht 68.0 in | Wt 276.0 lb

## 2018-08-23 DIAGNOSIS — Z9189 Other specified personal risk factors, not elsewhere classified: Secondary | ICD-10-CM | POA: Diagnosis not present

## 2018-08-23 DIAGNOSIS — E559 Vitamin D deficiency, unspecified: Secondary | ICD-10-CM

## 2018-08-23 DIAGNOSIS — Z6841 Body Mass Index (BMI) 40.0 and over, adult: Secondary | ICD-10-CM

## 2018-08-23 DIAGNOSIS — E1165 Type 2 diabetes mellitus with hyperglycemia: Secondary | ICD-10-CM

## 2018-08-23 MED ORDER — LISINOPRIL 5 MG PO TABS: 5.0000 mg | ORAL_TABLET | Freq: Every day | ORAL | 0 refills | Status: DC

## 2018-08-23 MED ORDER — METFORMIN HCL 500 MG PO TABS: 500.0000 mg | ORAL_TABLET | Freq: Three times a day (TID) | ORAL | 0 refills | Status: DC

## 2018-08-23 MED ORDER — CANAGLIFLOZIN 100 MG PO TABS: 100.0000 mg | ORAL_TABLET | Freq: Every day | ORAL | 0 refills | Status: DC

## 2018-08-23 MED ORDER — VITAMIN D (ERGOCALCIFEROL) 1.25 MG (50000 UNIT) PO CAPS: 50000.0000 [IU] | ORAL_CAPSULE | ORAL | 0 refills | Status: DC

## 2018-08-23 MED ORDER — ATORVASTATIN CALCIUM 10 MG PO TABS: 10.0000 mg | ORAL_TABLET | Freq: Every day | ORAL | 0 refills | Status: DC

## 2018-08-23 NOTE — Progress Notes (Signed)
Office: [phone]  /  Fax: [phone]   HPI:   Chief Complaint: OBESITY Steven Hodge is here to discuss his progress with his obesity treatment plan. He is on the Category 3 plan and is following his eating plan approximately 100 % of the time. He states he is walking for 30 minutes 1-2 times per week. Steven Hodge has felt markedly better in the last 2 weeks, since stopping GLP-1. He has noticed since stopping Rybelsus, he has had some lower extremity swelling. He denies increase in hunger.  His weight is 276 lb (125.2 kg) today and has gained 3 pounds since his last visit. He has lost 0 lbs since starting treatment with [name].  Diabetes II with Hyperglycemia Steven Hodge has a diagnosis of diabetes type II. Steven Hodge states fasting BGs range between 146 and 260. He did not tolerate GLP-1 secondary to GI side effects or bloating and significant abdominal pain. Last A1c was 6.7. He denies hypoglycemia. He has been working on intensive lifestyle modifications including diet, exercise, and weight loss to help control his blood glucose levels.  Vitamin D Deficiency Steven Hodge has a diagnosis of vitamin D deficiency. He is currently taking prescription Vit D. She notes fatigue and denies nausea, vomiting or muscle weakness.  At risk for osteopenia and osteoporosis Steven Hodge is at higher risk of osteopenia and osteoporosis due to vitamin D deficiency.   ASSESSMENT AND PLAN:  Type 2 diabetes mellitus with hyperglycemia, without long-term current use of insulin (HCC) - Plan: lisinopril (PRINIVIL,ZESTRIL) 5 MG tablet, metFORMIN (GLUCOPHAGE) 500 MG tablet, canagliflozin (INVOKANA) 100 MG TABS tablet  Other hyperlipidemia - Plan: atorvastatin (LIPITOR) 10 MG tablet  Vitamin D deficiency - Plan: Vitamin D, Ergocalciferol, (DRISDOL) 1.25 MG (50000 UT) CAPS capsule  At risk for osteoporosis  Class 3 severe obesity with serious comorbidity and body mass index (BMI) of 40.0 to 44.9 in adult, unspecified obesity type ([place])   PLAN:  Diabetes II with Hyperglycemia Steven Hodge has been given extensive diabetes education by myself today including ideal fasting and post-prandial blood glucose readings, individual ideal Hgb A1c goals and hypoglycemia prevention. We discussed the importance of good blood sugar control to decrease the likelihood of diabetic complications such as nephropathy, neuropathy, limb loss, blindness, coronary artery disease, and death. We discussed the importance of intensive lifestyle modification including diet, exercise and weight loss as the first line treatment for diabetes. Sang agrees to start Invokana 100 mg PO q AM (prior to first meal) #30 with no refills. He agrees to continue taking lisinopril 5 mg PO daily #30 and we will refill for 1 month, and he agrees to continue taking metformin 500 mg PO TID #90 and we will refill for 1 month. Steven Hodge agrees to continue atorvastatin 10 mg PO q daily #30 and we will refill for 1 month. Steven Hodge agrees to follow up with our clinic in 4 weeks.  Vitamin D Deficiency Steven Hodge was informed that low vitamin D levels contributes to fatigue and are associated with obesity, breast, and colon cancer. Steven Hodge agrees to continue taking prescription Vit D _0 ,000 IU every week #4 and we will refill for 1 month. He will follow up for routine testing of vitamin D, at least 2-3 times per year. He was informed of the risk of over-replacement of vitamin D and agrees to not increase his dose unless he discusses this with [name] first. Steven Hodge agrees to follow up with our clinic in 4 weeks.  At risk for osteopenia and osteoporosis Steven Hodge was given extended (15  minutes) osteoporosis prevention counseling today. Steven Hodge is at risk for osteopenia and osteoporsis due to his vitamin D deficiency. He was encouraged to take his vitamin D and follow his higher calcium diet and increase strengthening exercise to help strengthen his bones and decrease his risk of osteopenia and osteoporosis.  Obesity Steven Hodge  is currently in the action stage of change. As such, his goal is to continue with weight loss efforts He has agreed to follow the Category 3 plan Steven Hodge has been instructed to work up to a goal of 150 minutes of combined cardio and strengthening exercise per week for weight loss and overall health benefits. We discussed the following Behavioral Modification Strategies today: increasing lean protein intake, increasing vegetables, work on meal planning and easy cooking plans, better snacking choices, and planning for success   Steven Hodge has agreed to follow up with our clinic in 4 weeks. He was informed of the importance of frequent follow up visits to maximize his success with intensive lifestyle modifications for his multiple health conditions.  ALLERGIES: No Known Allergies  MEDICATIONS: Current Outpatient Medications on File Prior to Visit  Medication Sig Dispense Refill  . allopurinol (ZYLOPRIM) 300 MG tablet Take 1 tablet (300 mg total) by mouth daily. 90 tablet 1  . aspirin 325 MG tablet Take 325 mg by mouth daily.    . blood glucose meter kit and supplies Dispense based on patient and insurance preference. Use up to four times daily as directed. (FOR ICD-9 250.00, 250.01). 1 each 0  . Cinnamon 500 MG TABS Take 1,000 mg by mouth daily.    . CONTOUR NEXT TEST test strip USE TO CHECK BLOOD SUGAR UP TO 4 TIMES ADAY 100 each 0  . fish oil-omega-3 fatty acids 1000 MG capsule Take 2 g by mouth daily.    [name] gabapentin (NEURONTIN) 300 MG capsule Take 1 capsule (300 mg total) by mouth 3 (three) times daily. 30 capsule 3  . glucosamine-chondroitin 500-400 MG tablet Take 1 tablet by mouth once.    . Insulin Pen Needle (SURE COMFORT PEN NEEDLES) 32G X 4 MM MISC USE FOR VICTOZA INJECTIONS (Patient not taking: Reported on 07/06/2018) 100 each 0  . Multiple Vitamin (MULTIVITAMIN) tablet Take 1 tablet by mouth daily.    . polyethylene glycol powder (GLYCOLAX/MIRALAX) powder Take 17 g by mouth daily. 3350 g 0   . Semaglutide (RYBELSUS) 3 MG TABS Take 3 mg by mouth daily. 30 tablet 0  . TURMERIC PO Take 1,000 mg by mouth 2 (two) times daily.     No current facility-administered medications on file prior to visit.     PAST MEDICAL HISTORY: Past Medical History:  Diagnosis Date  . Back pain   . Cancer ([place]) 06/09/2006   Basal cell carcinoma scalp;   [name] Diabetes mellitus without complication ([place])   . Hx of blood clots   . Hypertension     PAST SURGICAL HISTORY: Past Surgical History:  Procedure Laterality Date  . basel cell cancer removed  2009  . KNEE SURGERY  2005   ACL repair R    SOCIAL HISTORY: Social History   Tobacco Use  . Smoking status: Former Research scientist (life sciences)  . Smokeless tobacco: Never Used  Substance Use Topics  . Alcohol use: No  . Drug use: No    FAMILY HISTORY: Family History  Problem Relation Age of Onset  . Diabetes Mother   . Liver disease Mother   . Obesity Mother   . Cancer Father 10  lung cancer  . Diabetes Father   . Cancer Sister 31       lung cancer  . Heart disease Brother 64       cardiac stenting/CAD  . Cancer Brother        skin cancer  . Arthritis Brother     ROS: Review of Systems  Constitutional: Positive for malaise/fatigue. Negative for weight loss.  Gastrointestinal: Negative for nausea and vomiting.  Musculoskeletal:       Negative muscle weakness  Endo/Heme/Allergies:       Negative hypoglycemia    PHYSICAL EXAM: Blood pressure 135/77, pulse 73, temperature 97.6 F (36.4 C), temperature source Oral, height _0  (1.727 m), weight 276 lb (125.2 kg), SpO2 97 %. Body mass index is 41.97 kg/m. Physical Exam Vitals signs reviewed.  Constitutional:      Appearance: Normal appearance. He is obese.  Cardiovascular:     Rate and Rhythm: Normal rate.     Pulses: Normal pulses.  Pulmonary:     Effort: Pulmonary effort is normal.     Breath sounds: Normal breath sounds.  Musculoskeletal: Normal range of motion.  Skin:     General: Skin is warm and dry.  Neurological:     Mental Status: He is alert and oriented to person, place, and time.  Psychiatric:        Mood and Affect: Mood normal.        Behavior: Behavior normal.     RECENT LABS AND TESTS: BMET    Component Value Date/Time   NA 141 02/23/2018 0814   K 4.7 02/23/2018 0814   CL 104 02/23/2018 0814   CO2 24 02/23/2018 0814   GLUCOSE 151 (H) 02/23/2018 0814   GLUCOSE 146 (H) 04/22/2016 1349   BUN 19 02/23/2018 0814   CREATININE 0.88 02/23/2018 0814   CREATININE 0.88 04/22/2016 1349   CALCIUM 9.0 02/23/2018 0814   GFRNONAA 97 02/23/2018 0814   GFRAA 113 02/23/2018 0814   Lab Results  Component Value Date   HGBA1C 6.7 (H) 06/24/2018   HGBA1C 6.3 (H) 02/23/2018   HGBA1C 6.1 (H) 10/12/2017   HGBA1C 6.4 (H) 06/03/2017   HGBA1C 6.0 (H) 02/12/2017   Lab Results  Component Value Date   INSULIN 26.7 (H) 06/24/2018   INSULIN 28.0 (H) 02/23/2018   INSULIN 31.8 (H) 10/12/2017   INSULIN 30.2 (H) 02/12/2017   INSULIN 32.4 (H) 09/18/2016   CBC    Component Value Date/Time   WBC 8.4 11/06/2017 1717   WBC 9.8 11/25/2016 1119   WBC 8.2 04/22/2016 1349   RBC 5.14 11/06/2017 1717   RBC 4.57 (A) 11/25/2016 1119   RBC 5.24 04/22/2016 1349   HGB 15.3 11/06/2017 1717   HCT 44.2 11/06/2017 1717   PLT 207 11/06/2017 1717   MCV 86 11/06/2017 1717   MCH 29.8 11/06/2017 1717   MCH 31.5 (A) 11/25/2016 1119   MCH 30.9 04/22/2016 1349   MCHC 34.6 11/06/2017 1717   MCHC 36.1 (A) 11/25/2016 1119   MCHC 34.4 04/22/2016 1349   RDW 14.1 11/06/2017 1717   LYMPHSABS 2.5 11/06/2017 1717   MONOABS 656 04/22/2016 1349   EOSABS 0.2 11/06/2017 1717   BASOSABS 0.0 11/06/2017 1717   Iron/TIBC/Ferritin/ %Sat No results found for: IRON, TIBC, FERRITIN, IRONPCTSAT Lipid Panel     Component Value Date/Time   CHOL 146 06/24/2018 0956   TRIG 113 06/24/2018 0956   HDL 45 06/24/2018 0956   CHOLHDL 4.0 06/03/2017 1130   CHOLHDL  4.4 04/22/2016 1349   VLDL 30  04/22/2016 1349   LDLCALC 78 06/24/2018 0956   Hepatic Function Panel     Component Value Date/Time   PROT 6.9 02/23/2018 0814   ALBUMIN 4.3 02/23/2018 0814   AST 20 02/23/2018 0814   ALT 30 02/23/2018 0814   ALKPHOS 71 02/23/2018 0814   BILITOT 0.4 02/23/2018 0814      Component Value Date/Time   TSH 2.230 09/18/2016 0956   TSH 2.44 04/22/2016 1349   TSH 1.731 03/09/2015 1524      OBESITY BEHAVIORAL INTERVENTION VISIT  Today's visit was # 30   Starting weight: 272 lbs Starting date: 09/18/16 Today's weight : 276 lbs Today's date: 08/23/2018 Total lbs lost to date: 0    08/23/2018  Height _0  (1.727 m)  Weight 276 lb (125.2 kg)  BMI (Calculated) 41.98  BLOOD PRESSURE - SYSTOLIC 329  BLOOD PRESSURE - DIASTOLIC 77   Body Fat % 41 %  Total Body Water (lbs) 132.2 lbs     ASK: We discussed the diagnosis of obesity with Steven Hodge today and Steven Hodge agreed to give [name] permission to discuss obesity behavioral modification therapy today.  ASSESS: Steven Hodge has the diagnosis of obesity and his BMI today is 41.98 Steven Hodge is in the action stage of change   ADVISE: Steven Hodge was educated on the multiple health risks of obesity as well as the benefit of weight loss to improve his health. He was advised of the need for long term treatment and the importance of lifestyle modifications to improve his current health and to decrease his risk of future health problems.  AGREE: Multiple dietary modification options and treatment options were discussed and  Steven Hodge agreed to follow the recommendations documented in the above note.  ARRANGE: Loyed was educated on the importance of frequent visits to treat obesity as outlined per CMS and USPSTF guidelines and agreed to schedule his next follow up appointment today.  I, Trixie Dredge, am acting as transcriptionist for Ilene Qua, MD  I have reviewed the above documentation for accuracy and completeness, and I agree with the above. -  Ilene Qua, MD

## 2018-08-24 ENCOUNTER — Encounter (INDEPENDENT_AMBULATORY_CARE_PROVIDER_SITE_OTHER): Payer: Self-pay | Admitting: Family Medicine

## 2018-08-31 ENCOUNTER — Encounter (INDEPENDENT_AMBULATORY_CARE_PROVIDER_SITE_OTHER): Payer: Self-pay

## 2018-09-04 ENCOUNTER — Encounter (INDEPENDENT_AMBULATORY_CARE_PROVIDER_SITE_OTHER): Payer: Self-pay | Admitting: Family Medicine

## 2018-09-06 NOTE — Telephone Encounter (Signed)
Please address.  Thank you

## 2018-09-13 MED FILL — ALLOPURINOL 300 MG TABS: 300 | 90 days supply | Qty: 90 | Fill #0

## 2018-09-20 ENCOUNTER — Encounter (INDEPENDENT_AMBULATORY_CARE_PROVIDER_SITE_OTHER): Payer: Self-pay | Admitting: Family Medicine

## 2018-09-20 ENCOUNTER — Ambulatory Visit (INDEPENDENT_AMBULATORY_CARE_PROVIDER_SITE_OTHER): Payer: BLUE CROSS/BLUE SHIELD | Admitting: Family Medicine

## 2018-09-20 ENCOUNTER — Other Ambulatory Visit: Payer: Self-pay

## 2018-09-20 DIAGNOSIS — E119 Type 2 diabetes mellitus without complications: Secondary | ICD-10-CM

## 2018-09-20 DIAGNOSIS — E559 Vitamin D deficiency, unspecified: Secondary | ICD-10-CM | POA: Diagnosis not present

## 2018-09-20 DIAGNOSIS — Z6841 Body Mass Index (BMI) 40.0 and over, adult: Secondary | ICD-10-CM

## 2018-09-20 MED ORDER — VITAMIN D (ERGOCALCIFEROL) 1.25 MG (50000 UNIT) PO CAPS: 50000.0000 [IU] | ORAL_CAPSULE | ORAL | 0 refills | Status: DC

## 2018-09-20 MED ORDER — SITAGLIPTIN PHOSPHATE 100 MG PO TABS: 100.0000 mg | ORAL_TABLET | Freq: Every day | ORAL | 0 refills | Status: DC

## 2018-09-20 NOTE — Progress Notes (Signed)
Office: [phone]  /  Fax: [phone] TeleHealth Visit:  Steven Hodge has verbally consented to this TeleHealth visit today. The patient is located at home, the provider is located at the [facility] office. The participants in this visit include the listed provider and patient and patient's spouse. The visit was conducted today via webex.  HPI:   Chief Complaint: OBESITY Steven Hodge is here to discuss his progress with his obesity treatment plan. He is on the Category 3 plan and is following his eating plan approximately 100 % of the time. He states he is doing yard work and home projects for 8-12 hours 5-6 times per week. Steven Hodge stopped taking Invokana and is taking 2 tablets of metformin a day. He is eating meat and toast at breakfast, leftovers for lunch (meat and veggies), and then 10 oz of meat and veggies at dinner. 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  We were unable to weigh the patient today for this TeleHealth visit. He feels as if he has lost weight since his last visit. He has lost 0 lbs since starting treatment with [name].  Diabetes II with Hyperglycemia Steven Hodge has a diagnosis of diabetes type II. Steven Hodge states BGs low is 160 and High is 262. He denies hypoglycemia. He is taking metformin daily. He has not tolerated GLP1 or SGLT2 in the past. Last Hgb A1c was 6.7. He has been working on intensive lifestyle modifications including diet, exercise, and weight loss to help control his blood glucose levels.  Vitamin D Deficiency Steven Hodge has a diagnosis of vitamin D deficiency. He is currently taking prescription Vit D. He notes fatigue and denies nausea, vomiting or muscle weakness.  ASSESSMENT AND PLAN:  Vitamin D deficiency - Plan: Vitamin D, Ergocalciferol, (DRISDOL) 1.25 MG (50000 UT) CAPS capsule  Type 2 diabetes mellitus without complication, without long-term current use of insulin (HCC) - Plan:  sitaGLIPtin (JANUVIA) 100 MG tablet  Class 3 severe obesity with serious comorbidity and body mass index (BMI) of 40.0 to 44.9 in adult, unspecified obesity type ([place])  PLAN:  Diabetes II with Hyperglycemia Steven Hodge has been given extensive diabetes education by myself today including ideal fasting and post-prandial blood glucose readings, individual ideal Hgb A1c goals and hypoglycemia prevention. We discussed the importance of good blood sugar control to decrease the likelihood of diabetic complications such as nephropathy, neuropathy, limb loss, blindness, coronary artery disease, and death. We discussed the importance of intensive lifestyle modification including diet, exercise and weight loss as the first line treatment for diabetes. Clance agrees to continue taking metformin, and he agrees to start Januvia 100 mg PO daily #30 with no refills. Maven agrees to follow up with our clinic in 4 weeks.  Vitamin D Deficiency Steven Hodge was informed that low vitamin D levels contributes to fatigue and are associated with obesity, breast, and colon cancer. Steven Hodge agrees to continue taking prescription Vit D '@50' ,000 IU every week #4 and we will refill for 1 month. He will follow up for routine testing of vitamin D, at least 2-3 times per year. He was informed of the risk of over-replacement of vitamin D and agrees to not increase his dose unless he discusses this with [name] first. Steven Hodge agrees to follow up with our clinic in 4 weeks.  Obesity Steven Hodge is currently in the action stage of change. As such, his goal is to continue with weight loss efforts He has  agreed to follow the Category 3 plan Steven Hodge has been instructed to work up to a goal of 150 minutes of combined cardio and strengthening exercise per week for weight loss and overall health benefits. We discussed the following Behavioral Modification Strategies today: increasing lean protein intake, increasing vegetables, work on meal planning and easy cooking plans,  ways to avoid boredom eating, ways to avoid night time snacking, better snacking choices, and planning for success   Steven Hodge has agreed to follow up with our clinic in 4 weeks. He was informed of the importance of frequent follow up visits to maximize his success with intensive lifestyle modifications for his multiple health conditions.  ALLERGIES: No Known Allergies  MEDICATIONS: Current Outpatient Medications on File Prior to Visit  Medication Sig Dispense Refill   allopurinol (ZYLOPRIM) 300 MG tablet Take 1 tablet (300 mg total) by mouth daily. 90 tablet 1   aspirin 325 MG tablet Take 325 mg by mouth daily.     atorvastatin (LIPITOR) 10 MG tablet Take 1 tablet (10 mg total) by mouth daily. 30 tablet 0   blood glucose meter kit and supplies Dispense based on patient and insurance preference. Use up to four times daily as directed. (FOR ICD-9 250.00, 250.01). 1 each 0   canagliflozin (INVOKANA) 100 MG TABS tablet Take 1 tablet (100 mg total) by mouth daily before breakfast. 30 tablet 0   Cinnamon 500 MG TABS Take 1,000 mg by mouth daily.     CONTOUR NEXT TEST test strip USE TO CHECK BLOOD SUGAR UP TO 4 TIMES ADAY 100 each 0   fish oil-omega-3 fatty acids 1000 MG capsule Take 2 g by mouth daily.     gabapentin (NEURONTIN) 300 MG capsule Take 1 capsule (300 mg total) by mouth 3 (three) times daily. 30 capsule 3   glucosamine-chondroitin 500-400 MG tablet Take 1 tablet by mouth once.     Insulin Pen Needle (SURE COMFORT PEN NEEDLES) 32G X 4 MM MISC USE FOR VICTOZA INJECTIONS (Patient not taking: Reported on 07/06/2018) 100 each 0   lisinopril (PRINIVIL,ZESTRIL) 5 MG tablet Take 1 tablet (5 mg total) by mouth daily. 30 tablet 0   metFORMIN (GLUCOPHAGE) 500 MG tablet Take 1 tablet (500 mg total) by mouth 3 (three) times daily. 90 tablet 0   Multiple Vitamin (MULTIVITAMIN) tablet Take 1 tablet by mouth daily.     polyethylene glycol powder (GLYCOLAX/MIRALAX) powder Take 17 g by mouth  daily. 3350 g 0   Semaglutide (RYBELSUS) 3 MG TABS Take 3 mg by mouth daily. 30 tablet 0   TURMERIC PO Take 1,000 mg by mouth 2 (two) times daily.     No current facility-administered medications on file prior to visit.     PAST MEDICAL HISTORY: Past Medical History:  Diagnosis Date   Back pain    Cancer ([place]) 06/09/2006   Basal cell carcinoma scalp;    Diabetes mellitus without complication ([place])    Hx of blood clots    Hypertension     PAST SURGICAL HISTORY: Past Surgical History:  Procedure Laterality Date   basel cell cancer removed  2009   KNEE SURGERY  2005   ACL repair R    SOCIAL HISTORY: Social History   Tobacco Use   Smoking status: Former Smoker   Smokeless tobacco: Never Used  Substance Use Topics   Alcohol use: No   Drug use: No    FAMILY HISTORY: Family History  Problem Relation Age of Onset   Diabetes  Mother    Liver disease Mother    Obesity Mother    Cancer Father 53       lung cancer   Diabetes Father    Cancer Sister 56       lung cancer   Heart disease Brother 32       cardiac stenting/CAD   Cancer Brother        skin cancer   Arthritis Brother     ROS: Review of Systems  Constitutional: Positive for malaise/fatigue. Negative for weight loss.  Gastrointestinal: Negative for nausea and vomiting.  Musculoskeletal:       Negative muscle weakness  Endo/Heme/Allergies:       Negative hypoglycemia    PHYSICAL EXAM: Pt in no acute distress  RECENT LABS AND TESTS: BMET    Component Value Date/Time   NA 141 02/23/2018 0814   K 4.7 02/23/2018 0814   CL 104 02/23/2018 0814   CO2 24 02/23/2018 0814   GLUCOSE 151 (H) 02/23/2018 0814   GLUCOSE 146 (H) 04/22/2016 1349   BUN 19 02/23/2018 0814   CREATININE 0.88 02/23/2018 0814   CREATININE 0.88 04/22/2016 1349   CALCIUM 9.0 02/23/2018 0814   GFRNONAA 97 02/23/2018 0814   GFRAA 113 02/23/2018 0814   Lab Results  Component Value Date   HGBA1C 6.7 (H)  06/24/2018   HGBA1C 6.3 (H) 02/23/2018   HGBA1C 6.1 (H) 10/12/2017   HGBA1C 6.4 (H) 06/03/2017   HGBA1C 6.0 (H) 02/12/2017   Lab Results  Component Value Date   INSULIN 26.7 (H) 06/24/2018   INSULIN 28.0 (H) 02/23/2018   INSULIN 31.8 (H) 10/12/2017   INSULIN 30.2 (H) 02/12/2017   INSULIN 32.4 (H) 09/18/2016   CBC    Component Value Date/Time   WBC 8.4 11/06/2017 1717   WBC 9.8 11/25/2016 1119   WBC 8.2 04/22/2016 1349   RBC 5.14 11/06/2017 1717   RBC 4.57 (A) 11/25/2016 1119   RBC 5.24 04/22/2016 1349   HGB 15.3 11/06/2017 1717   HCT 44.2 11/06/2017 1717   PLT 207 11/06/2017 1717   MCV 86 11/06/2017 1717   MCH 29.8 11/06/2017 1717   MCH 31.5 (A) 11/25/2016 1119   MCH 30.9 04/22/2016 1349   MCHC 34.6 11/06/2017 1717   MCHC 36.1 (A) 11/25/2016 1119   MCHC 34.4 04/22/2016 1349   RDW 14.1 11/06/2017 1717   LYMPHSABS 2.5 11/06/2017 1717   MONOABS 656 04/22/2016 1349   EOSABS 0.2 11/06/2017 1717   BASOSABS 0.0 11/06/2017 1717   Iron/TIBC/Ferritin/ %Sat No results found for: IRON, TIBC, FERRITIN, IRONPCTSAT Lipid Panel     Component Value Date/Time   CHOL 146 06/24/2018 0956   TRIG 113 06/24/2018 0956   HDL 45 06/24/2018 0956   CHOLHDL 4.0 06/03/2017 1130   CHOLHDL 4.4 04/22/2016 1349   VLDL 30 04/22/2016 1349   LDLCALC 78 06/24/2018 0956   Hepatic Function Panel     Component Value Date/Time   PROT 6.9 02/23/2018 0814   ALBUMIN 4.3 02/23/2018 0814   AST 20 02/23/2018 0814   ALT 30 02/23/2018 0814   ALKPHOS 71 02/23/2018 0814   BILITOT 0.4 02/23/2018 0814      Component Value Date/Time   TSH 2.230 09/18/2016 0956   TSH 2.44 04/22/2016 1349   TSH 1.731 03/09/2015 1524      I, Trixie Dredge, am acting as transcriptionist for Ilene Qua, MD  I have reviewed the above documentation for accuracy and completeness, and I agree with the  above. - Ilene Qua, MD

## 2018-09-24 MED FILL — JANUVIA 100 MG TABLET: 100 | 30 days supply | Qty: 30 | Fill #0

## 2018-09-24 MED FILL — VIT D2 1.25 MG (50,000 UNIT: 1.25 MG | 28 days supply | Qty: 4 | Fill #0

## 2018-10-18 ENCOUNTER — Encounter (INDEPENDENT_AMBULATORY_CARE_PROVIDER_SITE_OTHER): Payer: Self-pay | Admitting: Family Medicine

## 2018-10-18 ENCOUNTER — Ambulatory Visit (INDEPENDENT_AMBULATORY_CARE_PROVIDER_SITE_OTHER): Payer: BLUE CROSS/BLUE SHIELD | Admitting: Family Medicine

## 2018-10-18 ENCOUNTER — Other Ambulatory Visit: Payer: Self-pay

## 2018-10-18 DIAGNOSIS — Z6841 Body Mass Index (BMI) 40.0 and over, adult: Secondary | ICD-10-CM | POA: Diagnosis not present

## 2018-10-18 DIAGNOSIS — E1165 Type 2 diabetes mellitus with hyperglycemia: Secondary | ICD-10-CM

## 2018-10-18 DIAGNOSIS — I10 Essential (primary) hypertension: Secondary | ICD-10-CM | POA: Diagnosis not present

## 2018-10-18 DIAGNOSIS — E66813 Obesity, class 3: Secondary | ICD-10-CM

## 2018-10-18 MED ORDER — LISINOPRIL 5 MG PO TABS: 5.0000 mg | ORAL_TABLET | Freq: Every day | ORAL | 0 refills | Status: DC

## 2018-10-18 MED ORDER — METFORMIN HCL 500 MG PO TABS: 500.0000 mg | ORAL_TABLET | Freq: Three times a day (TID) | ORAL | 0 refills | Status: DC

## 2018-10-18 MED ORDER — GLUCOSE BLOOD VI STRP: ORAL_STRIP | 0 refills | Status: DC

## 2018-10-18 MED ORDER — SITAGLIPTIN PHOSPHATE 100 MG PO TABS: 100.0000 mg | ORAL_TABLET | Freq: Every day | ORAL | 0 refills | Status: DC

## 2018-10-18 NOTE — Progress Notes (Signed)
Office: [phone]  /  Fax: [phone] TeleHealth Visit:  Steven Hodge has verbally consented to this TeleHealth visit today. The patient is located at home, the provider is located at the [facility] office. The participants in this visit include the listed provider and patient. The visit was conducted today via audio.  HPI:   Chief Complaint: OBESITY Steven Hodge is here to discuss his progress with his obesity treatment plan. He is on the Category 3 plan and is following his eating plan approximately 100 % of the time. He states he is doing some walking. [name] had a good few weeks, he went back to work today for the first day. He denies hunger. He is not doing poorly in terms of diet, just trying to get in 100 grams of protein and limits carbohydrates.  We were unable to weigh the patient today for this TeleHealth visit. He feels as if he has maintained his weight since his last visit. He has lost 0 lbs since starting treatment with [name].  Diabetes II with Hyperglycemia Steven Hodge has a diagnosis of diabetes type II. Andras reports consistent GI side effects of constipation, diarrhea, flatulence, and bloating with Januvia (previously reported these symptoms prior to starting Januvia). He states his BGs range between 158 and 215. He denies hypoglycemia. Last A1c was 6.7. He has been working on intensive lifestyle modifications including diet, exercise, and weight loss to help control his blood glucose levels.  Hypertension Steven Hodge is a 58 y.o. male with hypertension. Shadi's blood pressure was previously controlled. He denies chest pain, chest pressure, or headaches. He is working on weight loss to help control his blood pressure with the goal of decreasing his risk of heart attack and stroke.   ASSESSMENT AND PLAN:  Type 2 diabetes mellitus with hyperglycemia, without long-term current use of insulin ([place]) - Plan: sitaGLIPtin (JANUVIA) 100 MG tablet, glucose blood (CONTOUR NEXT  TEST) test strip, metFORMIN (GLUCOPHAGE) 500 MG tablet  Essential hypertension - Plan: lisinopril (ZESTRIL) 5 MG tablet  Class 3 severe obesity with serious comorbidity and body mass index (BMI) of 40.0 to 44.9 in adult, unspecified obesity type ([place])  PLAN:  Diabetes II with Hyperglycemia Steven Hodge has been given extensive diabetes education by myself today including ideal fasting and post-prandial blood glucose readings, individual ideal Hgb A1c goals and hypoglycemia prevention. We discussed the importance of good blood sugar control to decrease the likelihood of diabetic complications such as nephropathy, neuropathy, limb loss, blindness, coronary artery disease, and death. We discussed the importance of intensive lifestyle modification including diet, exercise and weight loss as the first line treatment for diabetes. Steven Hodge agrees to continue taking Januvia 100 mg PO daily #30 and we will refill for 1 month, he agrees to continue taking metformin 500 mg PO TID #90 and we will refill for 1 month. We will refill glucose test strips #100 with no refills. Cruz agrees to follow up with our clinic in 4 weeks.  Hypertension We discussed sodium restriction, working on healthy weight loss, and a regular exercise program as the means to achieve improved blood pressure control. Steven Hodge agreed with this plan and agreed to follow up as directed. We will continue to monitor his blood pressure as well as his progress with the above lifestyle modifications. Steven Hodge agrees to continue taking lisinopril 5 mg PO daily #30 and we will refill for 1 month. He will watch for signs of hypotension as he continues his lifestyle modifications. Steven Hodge agrees to  follow up with our clinic in 4 weeks.  Obesity Steven Hodge is currently in the action stage of change. As such, his goal is to continue with weight loss efforts He has agreed to portion control better and make smarter food choices, such as increase vegetables and decrease simple  carbohydrates or follow the Category 3 plan Steven Hodge has been instructed to work up to a goal of 150 minutes of combined cardio and strengthening exercise per week for weight loss and overall health benefits. We discussed the following Behavioral Modification Strategies today: increasing lean protein intake, increasing vegetables and work on meal planning and easy cooking plans, increase H20 intake, keeping healthy foods in the home, and planning for success   Steven Hodge has agreed to follow up with our clinic in 4 weeks. He was informed of the importance of frequent follow up visits to maximize his success with intensive lifestyle modifications for his multiple health conditions.  ALLERGIES: No Known Allergies  MEDICATIONS: Current Outpatient Medications on File Prior to Visit  Medication Sig Dispense Refill  . allopurinol (ZYLOPRIM) 300 MG tablet Take 1 tablet (300 mg total) by mouth daily. 90 tablet 1  . aspirin 325 MG tablet Take 325 mg by mouth daily.    [name] atorvastatin (LIPITOR) 10 MG tablet Take 1 tablet (10 mg total) by mouth daily. 30 tablet 0  . blood glucose meter kit and supplies Dispense based on patient and insurance preference. Use up to four times daily as directed. (FOR ICD-9 250.00, 250.01). 1 each 0  . Cinnamon 500 MG TABS Take 1,000 mg by mouth daily.    . CONTOUR NEXT TEST test strip USE TO CHECK BLOOD SUGAR UP TO 4 TIMES ADAY 100 each 0  . fish oil-omega-3 fatty acids 1000 MG capsule Take 2 g by mouth daily.    [name] gabapentin (NEURONTIN) 300 MG capsule Take 1 capsule (300 mg total) by mouth 3 (three) times daily. 30 capsule 3  . glucosamine-chondroitin 500-400 MG tablet Take 1 tablet by mouth once.    . Insulin Pen Needle (SURE COMFORT PEN NEEDLES) 32G X 4 MM MISC USE FOR VICTOZA INJECTIONS (Patient not taking: Reported on 07/06/2018) 100 each 0  . lisinopril (PRINIVIL,ZESTRIL) 5 MG tablet Take 1 tablet (5 mg total) by mouth daily. 30 tablet 0  . metFORMIN (GLUCOPHAGE) 500 MG tablet  Take 1 tablet (500 mg total) by mouth 3 (three) times daily. 90 tablet 0  . Multiple Vitamin (MULTIVITAMIN) tablet Take 1 tablet by mouth daily.    . polyethylene glycol powder (GLYCOLAX/MIRALAX) powder Take 17 g by mouth daily. 3350 g 0  . sitaGLIPtin (JANUVIA) 100 MG tablet Take 1 tablet (100 mg total) by mouth daily. 30 tablet 0  . TURMERIC PO Take 1,000 mg by mouth 2 (two) times daily.    . Vitamin D, Ergocalciferol, (DRISDOL) 1.25 MG (50000 UT) CAPS capsule Take 1 capsule (50,000 Units total) by mouth every 7 (seven) days. 4 capsule 0   No current facility-administered medications on file prior to visit.     PAST MEDICAL HISTORY: Past Medical History:  Diagnosis Date  . Back pain   . Cancer ([place]) 06/09/2006   Basal cell carcinoma scalp;   [name] Diabetes mellitus without complication ([place])   . Hx of blood clots   . Hypertension     PAST SURGICAL HISTORY: Past Surgical History:  Procedure Laterality Date  . basel cell cancer removed  2009  . KNEE SURGERY  2005   ACL repair R  SOCIAL HISTORY: Social History   Tobacco Use  . Smoking status: Former Research scientist (life sciences)  . Smokeless tobacco: Never Used  Substance Use Topics  . Alcohol use: No  . Drug use: No    FAMILY HISTORY: Family History  Problem Relation Age of Onset  . Diabetes Mother   . Liver disease Mother   . Obesity Mother   . Cancer Father 32       lung cancer  . Diabetes Father   . Cancer Sister 82       lung cancer  . Heart disease Brother 46       cardiac stenting/CAD  . Cancer Brother        skin cancer  . Arthritis Brother     ROS: Review of Systems  Constitutional: Negative for weight loss.  Cardiovascular: Negative for chest pain.       Negative chest pressure  Gastrointestinal: Positive for constipation and diarrhea.       + Bloating  Neurological: Negative for headaches.    PHYSICAL EXAM: Pt in no acute distress  RECENT LABS AND TESTS: BMET    Component Value Date/Time   NA 141 02/23/2018  0814   K 4.7 02/23/2018 0814   CL 104 02/23/2018 0814   CO2 24 02/23/2018 0814   GLUCOSE 151 (H) 02/23/2018 0814   GLUCOSE 146 (H) 04/22/2016 1349   BUN 19 02/23/2018 0814   CREATININE 0.88 02/23/2018 0814   CREATININE 0.88 04/22/2016 1349   CALCIUM 9.0 02/23/2018 0814   GFRNONAA 97 02/23/2018 0814   GFRAA 113 02/23/2018 0814   Lab Results  Component Value Date   HGBA1C 6.7 (H) 06/24/2018   HGBA1C 6.3 (H) 02/23/2018   HGBA1C 6.1 (H) 10/12/2017   HGBA1C 6.4 (H) 06/03/2017   HGBA1C 6.0 (H) 02/12/2017   Lab Results  Component Value Date   INSULIN 26.7 (H) 06/24/2018   INSULIN 28.0 (H) 02/23/2018   INSULIN 31.8 (H) 10/12/2017   INSULIN 30.2 (H) 02/12/2017   INSULIN 32.4 (H) 09/18/2016   CBC    Component Value Date/Time   WBC 8.4 11/06/2017 1717   WBC 9.8 11/25/2016 1119   WBC 8.2 04/22/2016 1349   RBC 5.14 11/06/2017 1717   RBC 4.57 (A) 11/25/2016 1119   RBC 5.24 04/22/2016 1349   HGB 15.3 11/06/2017 1717   HCT 44.2 11/06/2017 1717   PLT 207 11/06/2017 1717   MCV 86 11/06/2017 1717   MCH 29.8 11/06/2017 1717   MCH 31.5 (A) 11/25/2016 1119   MCH 30.9 04/22/2016 1349   MCHC 34.6 11/06/2017 1717   MCHC 36.1 (A) 11/25/2016 1119   MCHC 34.4 04/22/2016 1349   RDW 14.1 11/06/2017 1717   LYMPHSABS 2.5 11/06/2017 1717   MONOABS 656 04/22/2016 1349   EOSABS 0.2 11/06/2017 1717   BASOSABS 0.0 11/06/2017 1717   Iron/TIBC/Ferritin/ %Sat No results found for: IRON, TIBC, FERRITIN, IRONPCTSAT Lipid Panel     Component Value Date/Time   CHOL 146 06/24/2018 0956   TRIG 113 06/24/2018 0956   HDL 45 06/24/2018 0956   CHOLHDL 4.0 06/03/2017 1130   CHOLHDL 4.4 04/22/2016 1349   VLDL 30 04/22/2016 1349   LDLCALC 78 06/24/2018 0956   Hepatic Function Panel     Component Value Date/Time   PROT 6.9 02/23/2018 0814   ALBUMIN 4.3 02/23/2018 0814   AST 20 02/23/2018 0814   ALT 30 02/23/2018 0814   ALKPHOS 71 02/23/2018 0814   BILITOT 0.4 02/23/2018 4944  Component  Value Date/Time   TSH 2.230 09/18/2016 0956   TSH 2.44 04/22/2016 1349   TSH 1.731 03/09/2015 1524      I, Trixie Dredge, am acting as transcriptionist for Ilene Qua, MD  I have reviewed the above documentation for accuracy and completeness, and I agree with the above. - Ilene Qua, MD

## 2018-10-19 MED FILL — metFORMIN HCL 500 MG TABS: 500 | 30 days supply | Qty: 90 | Fill #0

## 2018-10-19 MED FILL — CONTOUR NEXT STRIPS: 25 days supply | Qty: 100 | Fill #0

## 2018-10-19 MED FILL — LISINOPRIL 5 MG TABLET: 5 | 30 days supply | Qty: 30 | Fill #0

## 2018-10-19 MED FILL — JANUVIA 100 MG TABLET: 100 | 30 days supply | Qty: 30 | Fill #0

## 2018-10-21 ENCOUNTER — Encounter (INDEPENDENT_AMBULATORY_CARE_PROVIDER_SITE_OTHER): Payer: Self-pay | Admitting: Family Medicine

## 2018-10-27 ENCOUNTER — Telehealth: Payer: BLUE CROSS/BLUE SHIELD | Admitting: Registered Nurse

## 2018-10-27 DIAGNOSIS — Z20828 Contact with and (suspected) exposure to other viral communicable diseases: Secondary | ICD-10-CM | POA: Diagnosis not present

## 2018-11-16 ENCOUNTER — Other Ambulatory Visit: Payer: Self-pay

## 2018-11-16 ENCOUNTER — Encounter (INDEPENDENT_AMBULATORY_CARE_PROVIDER_SITE_OTHER): Payer: Self-pay | Admitting: Family Medicine

## 2018-11-16 ENCOUNTER — Ambulatory Visit (INDEPENDENT_AMBULATORY_CARE_PROVIDER_SITE_OTHER): Payer: BLUE CROSS/BLUE SHIELD | Admitting: Family Medicine

## 2018-11-16 DIAGNOSIS — E559 Vitamin D deficiency, unspecified: Secondary | ICD-10-CM

## 2018-11-16 DIAGNOSIS — E1165 Type 2 diabetes mellitus with hyperglycemia: Secondary | ICD-10-CM | POA: Diagnosis not present

## 2018-11-16 DIAGNOSIS — I10 Essential (primary) hypertension: Secondary | ICD-10-CM

## 2018-11-16 DIAGNOSIS — Z6841 Body Mass Index (BMI) 40.0 and over, adult: Secondary | ICD-10-CM

## 2018-11-16 MED ORDER — LISINOPRIL 5 MG PO TABS: 5.0000 mg | ORAL_TABLET | Freq: Every day | ORAL | 0 refills | Status: DC

## 2018-11-16 MED ORDER — SITAGLIPTIN PHOSPHATE 100 MG PO TABS: 100.0000 mg | ORAL_TABLET | Freq: Every day | ORAL | 0 refills | Status: DC

## 2018-11-16 MED ORDER — VITAMIN D (ERGOCALCIFEROL) 1.25 MG (50000 UNIT) PO CAPS: 50000.0000 [IU] | ORAL_CAPSULE | ORAL | 0 refills | Status: DC

## 2018-11-16 MED ORDER — METFORMIN HCL 500 MG PO TABS: 500.0000 mg | ORAL_TABLET | Freq: Three times a day (TID) | ORAL | 0 refills | Status: DC

## 2018-11-16 NOTE — Progress Notes (Signed)
 Office: [phone]  /  Fax: [phone] TeleHealth Visit:  Steven Hodge has verbally consented to this TeleHealth visit today. The patient is located at home, the provider is located at the [facility] office. The participants in this visit include the listed provider and patient. Steven Hodge was unable to use realtime audiovisual technology today and the telehealth visit was conducted via audio only.   HPI:   Chief Complaint: OBESITY Steven Hodge is here to discuss his progress with his obesity treatment plan. He is on the Category 3 plan and is following his eating plan approximately 100 % of the time. He states he is is walking at work. Steven Hodge has been in and out of work the past few weeks, secondary to co-workers testing positive for COVID-19. He does have occasional bloating and difficulty eating or drinking at times. He is doing minimal carbohydrates in the day, mostly in form of small baked potato and 1 whole wheat wrap without other carbohydrates like cakes, cookies, and pasta.  We were unable to weigh the patient today for this TeleHealth visit. He feels as if he has maintained his weight since his last visit. He has lost 0 lbs since starting treatment with us.  Vitamin D Deficiency Steven Hodge has a diagnosis of vitamin D deficiency. He is currently taking prescription Vit D. He notes fatigue and denies nausea, vomiting or muscle weakness.  Diabetes II with Hyperglycemia Steven Hodge has a diagnosis of diabetes type II. Steven Hodge is interested in possibly retrying [employer]ok[employer]. He states his BGs range between 170 and 180's (improvement from previously). He denies hypoglycemia. Last A1c was 6.7. He has been working on intensive lifestyle modifications including diet, exercise, and weight loss to help control his blood glucose levels.  Hypertension Steven Hodge is a 57 y.o. male with hypertension. Steven Hodge's blood pressure was better controlled at last in office appointment. His blood pressure at home  is 117/72. He denies chest pain, chest pressure, or headaches. He is working on weight loss to help control his blood pressure with the goal of decreasing his risk of heart attack and stroke.  ASSESSMENT AND PLAN:  Vitamin D deficiency - Plan: Vitamin D, Ergocalciferol, (DRISDOL) 1.25 MG (50000 UT) CAPS capsule  Type 2 diabetes mellitus with hyperglycemia, without long-term current use of insulin ([facility]) - Plan: metFORMIN (GLUCOPHAGE) 500 MG tablet, sitaGLIPtin (JANUVIA) 100 MG tablet  Essential hypertension - Plan: lisinopril (ZESTRIL) 5 MG tablet  Class 3 severe obesity with serious comorbidity and body mass index (BMI) of 40.0 to 44.9 in adult, unspecified obesity type ([facility])  PLAN:  Vitamin D Deficiency Steven Hodge was informed that low vitamin D levels contributes to fatigue and are associated with obesity, breast, and colon cancer. Steven Hodge agrees to continue taking prescription Vit D @50,000 IU every week #4 and we will refill for 1 month. He will follow up for routine testing of vitamin D, at least 2-3 times per year. He was informed of the risk of over-replacement of vitamin D and agrees to not increase his dose unless he discusses this with us first. Steven Hodge agrees to follow up with our clinic in 4 weeks.  Diabetes II with Hyperglycemia Steven Hodge has been given extensive diabetes education by myself today including ideal fasting and post-prandial blood glucose readings, individual ideal Hgb A1c goals and hypoglycemia prevention. We discussed the importance of good blood sugar control to decrease the likelihood of diabetic complications such as nephropathy, neuropathy, limb loss, blindness, coronary artery disease, and death. We discussed   the importance of intensive lifestyle modification including diet, exercise and weight loss as the first line treatment for diabetes. Steven Hodge agrees to continue taking metformin 500 mg PO TID #90 and we will refill for 1 month, and he agrees to continue taking Januvia 100 mg  PO daily #30 and we will refill for 1 month. Steven Hodge agrees to follow up with our clinic in 4 weeks.  Hypertension We discussed sodium restriction, working on healthy weight loss, and a regular exercise program as the means to achieve improved blood pressure control. Steven Hodge agreed with this plan and agreed to follow up as directed. We will continue to monitor his blood pressure as well as his progress with the above lifestyle modifications. Steven Hodge agrees to continue taking lisinopril 5 mg PO q daily #30 and we will refill for 1 month. He will watch for signs of hypotension as he continues his lifestyle modifications. Steven Hodge agrees to follow up with our clinic in 4 weeks.  Obesity Steven Hodge is currently in the action stage of change. As such, his goal is to continue with weight loss efforts He has agreed to follow the Category 3 plan Steven Hodge has been instructed to work up to a goal of 150 minutes of combined cardio and strengthening exercise per week for weight loss and overall health benefits. We discussed the following Behavioral Modification Strategies today: increasing lean protein intake, increasing vegetables and decrease eating out, keeping healthy foods in the home, and planning for success   Steven Hodge has agreed to follow up with our clinic in 4 weeks. He was informed of the importance of frequent follow up visits to maximize his success with intensive lifestyle modifications for his multiple health conditions.  ALLERGIES: No Known Allergies  MEDICATIONS: Current Outpatient Medications on File Prior to Visit  Medication Sig Dispense Refill  . allopurinol (ZYLOPRIM) 300 MG tablet Take 1 tablet (300 mg total) by mouth daily. 90 tablet 1  . aspirin 325 MG tablet Take 325 mg by mouth daily.    . blood glucose meter kit and supplies Dispense based on patient and insurance preference. Use up to four times daily as directed. (FOR ICD-9 250.00, 250.01). 1 each 0  . Cinnamon 500 MG TABS Take 1,000 mg by  mouth daily.    . fish oil-omega-3 fatty acids 1000 MG capsule Take 2 g by mouth daily.    . gabapentin (NEURONTIN) 300 MG capsule Take 1 capsule (300 mg total) by mouth 3 (three) times daily. 30 capsule 3  . glucosamine-chondroitin 500-400 MG tablet Take 1 tablet by mouth once.    . glucose blood (CONTOUR NEXT TEST) test strip USE TO CHECK BLOOD SUGAR UP TO 4 TIMES ADAY 100 each 0  . Insulin Pen Needle (SURE COMFORT PEN NEEDLES) 32G X 4 MM MISC USE FOR VICTOZA INJECTIONS 100 each 0  . Multiple Vitamin (MULTIVITAMIN) tablet Take 1 tablet by mouth daily.    . polyethylene glycol powder (GLYCOLAX/MIRALAX) powder Take 17 g by mouth daily. 3350 g 0  . TURMERIC PO Take 1,000 mg by mouth 2 (two) times daily.    . atorvastatin (LIPITOR) 10 MG tablet Take 1 tablet (10 mg total) by mouth daily. (Patient not taking: Reported on 11/16/2018) 30 tablet 0   No current facility-administered medications on file prior to visit.     PAST MEDICAL HISTORY: Past Medical History:  Diagnosis Date  . Back pain   . Cancer ([facility]) 06/09/2006   Basal cell carcinoma scalp;   . Diabetes mellitus   without complication ([facility])   . Hx of blood clots   . Hypertension     PAST SURGICAL HISTORY: Past Surgical History:  Procedure Laterality Date  . basel cell cancer removed  2009  . KNEE SURGERY  2005   ACL repair R    SOCIAL HISTORY: Social History   Tobacco Use  . Smoking status: Former Smoker  . Smokeless tobacco: Never Used  Substance Use Topics  . Alcohol use: No  . Drug use: No    FAMILY HISTORY: Family History  Problem Relation Age of Onset  . Diabetes Mother   . Liver disease Mother   . Obesity Mother   . Cancer Father 70       lung cancer  . Diabetes Father   . Cancer Sister 65       lung cancer  . Heart disease Brother 66       cardiac stenting/CAD  . Cancer Brother        skin cancer  . Arthritis Brother     ROS: Review of Systems  Constitutional: Positive for malaise/fatigue. Negative  for weight loss.  Cardiovascular: Negative for chest pain.       Negative chest pressure  Gastrointestinal: Negative for nausea and vomiting.  Musculoskeletal:       Negative muscle weakness  Neurological: Negative for headaches.  Endo/Heme/Allergies:       Negative hypoglycemia    PHYSICAL EXAM: Pt in no acute distress  RECENT LABS AND TESTS: BMET    Component Value Date/Time   NA 141 02/23/2018 0814   K 4.7 02/23/2018 0814   CL 104 02/23/2018 0814   CO2 24 02/23/2018 0814   GLUCOSE 151 (H) 02/23/2018 0814   GLUCOSE 146 (H) 04/22/2016 1349   BUN 19 02/23/2018 0814   CREATININE 0.88 02/23/2018 0814   CREATININE 0.88 04/22/2016 1349   CALCIUM 9.0 02/23/2018 0814   GFRNONAA 97 02/23/2018 0814   GFRAA 113 02/23/2018 0814   Lab Results  Component Value Date   HGBA1C 6.7 (H) 06/24/2018   HGBA1C 6.3 (H) 02/23/2018   HGBA1C 6.1 (H) 10/12/2017   HGBA1C 6.4 (H) 06/03/2017   HGBA1C 6.0 (H) 02/12/2017   Lab Results  Component Value Date   INSULIN 26.7 (H) 06/24/2018   INSULIN 28.0 (H) 02/23/2018   INSULIN 31.8 (H) 10/12/2017   INSULIN 30.2 (H) 02/12/2017   INSULIN 32.4 (H) 09/18/2016   CBC    Component Value Date/Time   WBC 8.4 11/06/2017 1717   WBC 9.8 11/25/2016 1119   WBC 8.2 04/22/2016 1349   RBC 5.14 11/06/2017 1717   RBC 4.57 (A) 11/25/2016 1119   RBC 5.24 04/22/2016 1349   HGB 15.3 11/06/2017 1717   HCT 44.2 11/06/2017 1717   PLT 207 11/06/2017 1717   MCV 86 11/06/2017 1717   MCH 29.8 11/06/2017 1717   MCH 31.5 (A) 11/25/2016 1119   MCH 30.9 04/22/2016 1349   MCHC 34.6 11/06/2017 1717   MCHC 36.1 (A) 11/25/2016 1119   MCHC 34.4 04/22/2016 1349   RDW 14.1 11/06/2017 1717   LYMPHSABS 2.5 11/06/2017 1717   MONOABS 656 04/22/2016 1349   EOSABS 0.2 11/06/2017 1717   BASOSABS 0.0 11/06/2017 1717   Iron/TIBC/Ferritin/ %Sat No results found for: IRON, TIBC, FERRITIN, IRONPCTSAT Lipid Panel     Component Value Date/Time   CHOL 146 06/24/2018 0956    TRIG 113 06/24/2018 0956   HDL 45 06/24/2018 0956   CHOLHDL 4.0 06/03/2017 1130   CHOLHDL   4.4 04/22/2016 1349   VLDL 30 04/22/2016 1349   LDLCALC 78 06/24/2018 0956   Hepatic Function Panel     Component Value Date/Time   PROT 6.9 02/23/2018 0814   ALBUMIN 4.3 02/23/2018 0814   AST 20 02/23/2018 0814   ALT 30 02/23/2018 0814   ALKPHOS 71 02/23/2018 0814   BILITOT 0.4 02/23/2018 0814      Component Value Date/Time   TSH 2.230 09/18/2016 0956   TSH 2.44 04/22/2016 1349   TSH 1.731 03/09/2015 1524      I, Sharon Martin, am acting as transcriptionist for Alexandria Kadolph, MD   I have reviewed the above documentation for accuracy and completeness, and I agree with the above. - Alexandria Kadolph, MD  

## 2018-11-17 MED FILL — metFORMIN HCL 500 MG TABS: 500 | 30 days supply | Qty: 90 | Fill #0

## 2018-11-17 MED FILL — JANUVIA 100 MG TABLET: 100 | 30 days supply | Qty: 30 | Fill #0

## 2018-11-17 MED FILL — VIT D2 1.25 MG (50,000 UNIT: 1.25 MG | 28 days supply | Qty: 4 | Fill #0

## 2018-11-17 MED FILL — LISINOPRIL 5 MG TABLET: 5 | 30 days supply | Qty: 30 | Fill #0

## 2018-11-19 DIAGNOSIS — E119 Type 2 diabetes mellitus without complications: Secondary | ICD-10-CM | POA: Diagnosis not present

## 2018-11-19 DIAGNOSIS — H35411 Lattice degeneration of retina, right eye: Secondary | ICD-10-CM | POA: Diagnosis not present

## 2018-11-19 LAB — HM DIABETES EYE EXAM

## 2018-11-27 LAB — HM DIABETES EYE EXAM

## 2018-12-16 ENCOUNTER — Telehealth (INDEPENDENT_AMBULATORY_CARE_PROVIDER_SITE_OTHER): Payer: BC Managed Care – PPO | Admitting: Family Medicine

## 2018-12-16 ENCOUNTER — Encounter (INDEPENDENT_AMBULATORY_CARE_PROVIDER_SITE_OTHER): Payer: Self-pay | Admitting: Family Medicine

## 2018-12-16 ENCOUNTER — Other Ambulatory Visit: Payer: Self-pay

## 2018-12-16 DIAGNOSIS — E559 Vitamin D deficiency, unspecified: Secondary | ICD-10-CM | POA: Diagnosis not present

## 2018-12-16 DIAGNOSIS — I10 Essential (primary) hypertension: Secondary | ICD-10-CM

## 2018-12-16 DIAGNOSIS — E1165 Type 2 diabetes mellitus with hyperglycemia: Secondary | ICD-10-CM | POA: Diagnosis not present

## 2018-12-16 MED ORDER — SITAGLIPTIN PHOSPHATE 100 MG PO TABS: 100.0000 mg | ORAL_TABLET | Freq: Every day | ORAL | 0 refills | Status: DC

## 2018-12-16 MED ORDER — METFORMIN HCL 500 MG PO TABS: 500.0000 mg | ORAL_TABLET | Freq: Three times a day (TID) | ORAL | 0 refills | Status: DC

## 2018-12-16 MED ORDER — LISINOPRIL 5 MG PO TABS: 5.0000 mg | ORAL_TABLET | Freq: Every day | ORAL | 0 refills | Status: DC

## 2018-12-16 MED ORDER — VITAMIN D (ERGOCALCIFEROL) 1.25 MG (50000 UNIT) PO CAPS: 50000.0000 [IU] | ORAL_CAPSULE | ORAL | 0 refills | Status: DC

## 2018-12-16 NOTE — Progress Notes (Signed)
Office: [phone]  /  Fax: [phone] TeleHealth Visit:  Steven Hodge has verbally consented to this TeleHealth visit today. The patient is located at home, the provider is located at the [facility] office. The participants in this visit include the listed provider and patient. Steven Hodge was unable to use realtime audiovisual technology today and the telehealth visit was conducted via audio only.   HPI:   Chief Complaint: OBESITY [name] is here to discuss his progress with his obesity treatment plan. He is on the Category 3 plan and is following his eating plan approximately 100 % of the time. He states [place] farm. Steven Hodge voices the first few weeks of the last 4 weeks were difficult, secondary to joint swelling and lower extremity swelling.  We were unable to weigh the patient today for this TeleHealth visit. He feels as if he has maintained his weight since his last visit. He has lost 0 lbs since starting treatment with [name].  Diabetes II with Hyperglycemia Steven Hodge has a diagnosis of diabetes type II. Steven Hodge states his average BGs range at 180 and fasting BGs of 205. He voices concern over BGs and control of BGs. He has repeatedly had side effects of gas and bloating with all diabetes medication he has tried, such as Victoza, Invokana, Rybelsus, and Januvia. He denies hypoglycemia. Last A1c was 6.7. He has been working on intensive lifestyle modifications including diet, exercise, and weight loss to help control his blood glucose levels.  ASSESSMENT AND PLAN:  Type 2 diabetes mellitus with hyperglycemia, without long-term current use of insulin ([place]) - Plan: sitaGLIPtin (JANUVIA) 100 MG tablet, metFORMIN (GLUCOPHAGE) 500 MG tablet  Vitamin D deficiency - Plan: Vitamin D, Ergocalciferol, (DRISDOL) 1.25 MG (50000 UT) CAPS capsule  Essential hypertension - Plan: lisinopril (ZESTRIL) 5 MG tablet  PLAN:  Diabetes II with Hyperglycemia Steven Hodge has been given extensive  diabetes education by myself today including ideal fasting and post-prandial blood glucose readings, individual ideal Hgb A1c goals and hypoglycemia prevention. We discussed the importance of good blood sugar control to decrease the likelihood of diabetic complications such as nephropathy, neuropathy, limb loss, blindness, coronary artery disease, and death. We discussed the importance of intensive lifestyle modification including diet, exercise and weight loss as the first line treatment for diabetes. We will check Hgb A1c and insulin level today. Tredarius agrees to follow up with our clinic in 4 weeks.  Obesity Steven Hodge is currently in the action stage of change. As such, his goal is to continue with weight loss efforts He has agreed to follow the Category 3 plan Steven Hodge has been instructed to work up to a goal of 150 minutes of combined cardio and strengthening exercise per week for weight loss and overall health benefits. We discussed the following Behavioral Modification Strategies today: increasing lean protein intake, increasing vegetables and work on meal planning and easy cooking plans, keeping healthy foods in the home, better sncaking choices, and planning for success   Steven Hodge has agreed to follow up with our clinic in 4 weeks. He was informed of the importance of frequent follow up visits to maximize his success with intensive lifestyle modifications for his multiple health conditions.  ALLERGIES: No Known Allergies  MEDICATIONS: Current Outpatient Medications on File Prior to Visit  Medication Sig Dispense Refill  . allopurinol (ZYLOPRIM) 300 MG tablet Take 1 tablet (300 mg total) by mouth daily. 90 tablet 1  . aspirin 325 MG tablet Take 325 mg by  mouth daily.    . blood glucose meter kit and supplies Dispense based on patient and insurance preference. Use up to four times daily as directed. (FOR ICD-9 250.00, 250.01). 1 each 0  . Cinnamon 500 MG TABS Take 1,000 mg by mouth daily.    . fish  oil-omega-3 fatty acids 1000 MG capsule Take 2 g by mouth daily.    [name] gabapentin (NEURONTIN) 300 MG capsule Take 1 capsule (300 mg total) by mouth 3 (three) times daily. 30 capsule 3  . glucosamine-chondroitin 500-400 MG tablet Take 1 tablet by mouth once.    [name] glucose blood (CONTOUR NEXT TEST) test strip USE TO CHECK BLOOD SUGAR UP TO 4 TIMES ADAY 100 each 0  . Insulin Pen Needle (SURE COMFORT PEN NEEDLES) 32G X 4 MM MISC USE FOR VICTOZA INJECTIONS 100 each 0  . Multiple Vitamin (MULTIVITAMIN) tablet Take 1 tablet by mouth daily.    . TURMERIC PO Take 1,000 mg by mouth 2 (two) times daily.    [name] atorvastatin (LIPITOR) 10 MG tablet Take 1 tablet (10 mg total) by mouth daily. (Patient not taking: Reported on 12/16/2018) 30 tablet 0  . polyethylene glycol powder (GLYCOLAX/MIRALAX) powder Take 17 g by mouth daily. (Patient not taking: Reported on 12/16/2018) 3350 g 0   No current facility-administered medications on file prior to visit.     PAST MEDICAL HISTORY: Past Medical History:  Diagnosis Date  . Back pain   . Cancer ([place]) 06/09/2006   Basal cell carcinoma scalp;   [name] Diabetes mellitus without complication ([place])   . Hx of blood clots   . Hypertension     PAST SURGICAL HISTORY: Past Surgical History:  Procedure Laterality Date  . basel cell cancer removed  2009  . KNEE SURGERY  2005   ACL repair R    SOCIAL HISTORY: Social History   Tobacco Use  . Smoking status: Former Research scientist (life sciences)  . Smokeless tobacco: Never Used  Substance Use Topics  . Alcohol use: No  . Drug use: No    FAMILY HISTORY: Family History  Problem Relation Age of Onset  . Diabetes Mother   . Liver disease Mother   . Obesity Mother   . Cancer Father 4       lung cancer  . Diabetes Father   . Cancer Sister 48       lung cancer  . Heart disease Brother 17       cardiac stenting/CAD  . Cancer Brother        skin cancer  . Arthritis Brother     ROS: Review of Systems  Constitutional: Negative for weight  loss.  Cardiovascular:       + Lower extremity swelling  Musculoskeletal:       + Joint swelling  Endo/Heme/Allergies:       Negative hypoglycemia    PHYSICAL EXAM: Pt in no acute distress  RECENT LABS AND TESTS: BMET    Component Value Date/Time   NA 141 02/23/2018 0814   K 4.7 02/23/2018 0814   CL 104 02/23/2018 0814   CO2 24 02/23/2018 0814   GLUCOSE 151 (H) 02/23/2018 0814   GLUCOSE 146 (H) 04/22/2016 1349   BUN 19 02/23/2018 0814   CREATININE 0.88 02/23/2018 0814   CREATININE 0.88 04/22/2016 1349   CALCIUM 9.0 02/23/2018 0814   GFRNONAA 97 02/23/2018 0814   GFRAA 113 02/23/2018 0814   Lab Results  Component Value Date   HGBA1C 6.7 (H) 06/24/2018  HGBA1C 6.3 (H) 02/23/2018   HGBA1C 6.1 (H) 10/12/2017   HGBA1C 6.4 (H) 06/03/2017   HGBA1C 6.0 (H) 02/12/2017   Lab Results  Component Value Date   INSULIN 26.7 (H) 06/24/2018   INSULIN 28.0 (H) 02/23/2018   INSULIN 31.8 (H) 10/12/2017   INSULIN 30.2 (H) 02/12/2017   INSULIN 32.4 (H) 09/18/2016   CBC    Component Value Date/Time   WBC 8.4 11/06/2017 1717   WBC 9.8 11/25/2016 1119   WBC 8.2 04/22/2016 1349   RBC 5.14 11/06/2017 1717   RBC 4.57 (A) 11/25/2016 1119   RBC 5.24 04/22/2016 1349   HGB 15.3 11/06/2017 1717   HCT 44.2 11/06/2017 1717   PLT 207 11/06/2017 1717   MCV 86 11/06/2017 1717   MCH 29.8 11/06/2017 1717   MCH 31.5 (A) 11/25/2016 1119   MCH 30.9 04/22/2016 1349   MCHC 34.6 11/06/2017 1717   MCHC 36.1 (A) 11/25/2016 1119   MCHC 34.4 04/22/2016 1349   RDW 14.1 11/06/2017 1717   LYMPHSABS 2.5 11/06/2017 1717   MONOABS 656 04/22/2016 1349   EOSABS 0.2 11/06/2017 1717   BASOSABS 0.0 11/06/2017 1717   Iron/TIBC/Ferritin/ %Sat No results found for: IRON, TIBC, FERRITIN, IRONPCTSAT Lipid Panel     Component Value Date/Time   CHOL 146 06/24/2018 0956   TRIG 113 06/24/2018 0956   HDL 45 06/24/2018 0956   CHOLHDL 4.0 06/03/2017 1130   CHOLHDL 4.4 04/22/2016 1349   VLDL 30 04/22/2016  1349   LDLCALC 78 06/24/2018 0956   Hepatic Function Panel     Component Value Date/Time   PROT 6.9 02/23/2018 0814   ALBUMIN 4.3 02/23/2018 0814   AST 20 02/23/2018 0814   ALT 30 02/23/2018 0814   ALKPHOS 71 02/23/2018 0814   BILITOT 0.4 02/23/2018 0814      Component Value Date/Time   TSH 2.230 09/18/2016 0956   TSH 2.44 04/22/2016 1349   TSH 1.731 03/09/2015 1524      I, Trixie Dredge, am acting as transcriptionist for Ilene Qua, MD  I have reviewed the above documentation for accuracy and completeness, and I agree with the above. - Ilene Qua, MD

## 2018-12-17 ENCOUNTER — Encounter (INDEPENDENT_AMBULATORY_CARE_PROVIDER_SITE_OTHER): Payer: Self-pay | Admitting: Family Medicine

## 2018-12-17 ENCOUNTER — Telehealth: Payer: Self-pay

## 2018-12-17 NOTE — Telephone Encounter (Signed)
Copied from [place] [id number]. Topic: Appointment Scheduling - New Patient >> Dec 17, 2018 10:17 AM Burchel, Abbi R wrote: Provider: Dr Lorelei Pont  Pt's wife is a pt of Dr Lorelei Pont is would like to know if pt can eztablish with Dr Lorelei Pont.  Please call pt: [phone] or Pt's wife Kennyth Lose): [phone] to advise.

## 2018-12-17 NOTE — Telephone Encounter (Signed)
Yes that is fine

## 2018-12-20 NOTE — Telephone Encounter (Signed)
12/29/18 APPT MADE

## 2018-12-20 NOTE — Telephone Encounter (Signed)
Left message on pt's voicemail to call back to schedule appointment.

## 2018-12-22 ENCOUNTER — Other Ambulatory Visit (INDEPENDENT_AMBULATORY_CARE_PROVIDER_SITE_OTHER): Payer: Self-pay

## 2018-12-22 DIAGNOSIS — E559 Vitamin D deficiency, unspecified: Secondary | ICD-10-CM

## 2018-12-22 DIAGNOSIS — I10 Essential (primary) hypertension: Secondary | ICD-10-CM

## 2018-12-22 DIAGNOSIS — E119 Type 2 diabetes mellitus without complications: Secondary | ICD-10-CM

## 2018-12-28 NOTE — Progress Notes (Addendum)
[facility] at [facility] [address] [place], [place] [address] [phone]  Date:  12/29/2018   Name:  Steven Hodge   DOB:  Jun 30, 1960   MRN:  [id number]  PCP:  Darreld Mclean, MD    Chief Complaint: Transfer of Care (med refill on allopurinol)   History of Present Illness:  Steven Hodge is a 58 y.o. very pleasant male patient who presents with the following:  Pt with history of well controlled DM, obesity, HTN, gout, obesity Here today to establish care with myself - he is married to my patient Kennyth Lose and wishes to transfer his primary care here   Lab Results  Component Value Date   HGBA1C 6.7 (H) 06/24/2018   Due for an A1c and labs today Dr. Leafy Ro / Dr Adair Patter [facility] actually manage his DM.  He is not able to see them in person for a while due to pandemic.  Wonders if I can check some labs from today He notes that they have been working with him and adjusting his diabetes medications He does not check his BP at home   Lab Results  Component Value Date   PSA 0.4 04/22/2016   He ate about 4 hours ago   He notes chronic right leg swelling since he had surgery on this leg several years ago.  He wears compression socks and this does help  Patient Active Problem List   Diagnosis Date Noted  . Type 2 diabetes mellitus with hyperglycemia, without long-term current use of insulin ([place]) 07/06/2017  . 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  . Vitamin D deficiency 11/18/2016  . Class 2 obesity without serious comorbidity with body mass index (BMI) of 39.0 to 39.9 in adult 11/04/2016  . Type 2 diabetes mellitus without complication, without long-term current use of insulin ([place]) 09/18/2016  . Essential hypertension 09/18/2016  . Morbid obesity ([place]) 08/20/2016  . History of colonic polyps 08/20/2016    Past Medical History:  Diagnosis Date  . Back pain   .  Cancer ([place]) 06/09/2006   Basal cell carcinoma scalp;   [name] Diabetes mellitus without complication ([place])   . Gout   . Hx of blood clots   . Hypertension     Past Surgical History:  Procedure Laterality Date  . basel cell cancer removed  2009  . KNEE SURGERY  2005   ACL repair R    Social History   Tobacco Use  . Smoking status: Former Research scientist (life sciences)  . Smokeless tobacco: Never Used  Substance Use Topics  . Alcohol use: No  . Drug use: No    Family History  Problem Relation Age of Onset  . Diabetes Mother   . Liver disease Mother   . Obesity Mother   . Cancer Father 51       lung cancer  . Diabetes Father   . Cancer Sister 31       lung cancer  . Heart disease Brother 29       cardiac stenting/CAD  . Cancer Brother        skin cancer  . Arthritis Brother     No Known Allergies  Medication list has been reviewed and updated.  Current Outpatient Medications on File Prior to Visit  Medication Sig Dispense Refill  . aspirin 325 MG tablet Take 325 mg by mouth daily.    . blood  glucose meter kit and supplies Dispense based on patient and insurance preference. Use up to four times daily as directed. (FOR ICD-9 250.00, 250.01). 1 each 0  . Cinnamon 500 MG TABS Take 1,000 mg by mouth daily.    . fish oil-omega-3 fatty acids 1000 MG capsule Take 2 g by mouth daily.    [name] gabapentin (NEURONTIN) 300 MG capsule Take 1 capsule (300 mg total) by mouth 3 (three) times daily. 30 capsule 3  . glucosamine-chondroitin 500-400 MG tablet Take 1 tablet by mouth once.    [name] glucose blood (CONTOUR NEXT TEST) test strip USE TO CHECK BLOOD SUGAR UP TO 4 TIMES ADAY 100 each 0  . Insulin Pen Needle (SURE COMFORT PEN NEEDLES) 32G X 4 MM MISC USE FOR VICTOZA INJECTIONS 100 each 0  . lisinopril (ZESTRIL) 5 MG tablet Take 1 tablet (5 mg total) by mouth daily. 30 tablet 0  . metFORMIN (GLUCOPHAGE) 500 MG tablet Take 1 tablet (500 mg total) by mouth 3 (three) times daily. 90 tablet 0  . Multiple Vitamin  (MULTIVITAMIN) tablet Take 1 tablet by mouth daily.    . polyethylene glycol powder (GLYCOLAX/MIRALAX) powder Take 17 g by mouth daily. 3350 g 0  . sitaGLIPtin (JANUVIA) 100 MG tablet Take 1 tablet (100 mg total) by mouth daily. 30 tablet 0  . TURMERIC PO Take 1,000 mg by mouth 2 (two) times daily.    . Vitamin D, Ergocalciferol, (DRISDOL) 1.25 MG (50000 UT) CAPS capsule Take 1 capsule (50,000 Units total) by mouth every 7 (seven) days. 4 capsule 0   No current facility-administered medications on file prior to visit.     Review of Systems:  As per HPI- otherwise negative. No fever or chills, chest pain or shortness of breath  Physical Examination: Vitals:   12/29/18 1550  BP: 134/78  Pulse: 85  Resp: 18  Temp: 98.4 F (36.9 C)  SpO2: 98%   Vitals:   12/29/18 1550  Weight: 271 lb (122.9 kg)  Height: 5' 8" (1.727 m)   Body mass index is 41.21 kg/m. Ideal Body Weight: Weight in (lb) to have BMI = 25: 164.1  GEN: WDWN, NAD, Non-toxic, A & O x 3, obese, looks well HEENT: Atraumatic, Normocephalic. Neck supple. No masses, No LAD. Ears and Nose: No external deformity. CV: RRR, No M/G/R. No JVD. No thrill. No extra heart sounds. PULM: CTA B, no wheezes, crackles, rhonchi. No retractions. No resp. distress. No accessory muscle use. ABD: S, NT, ND, +BS. No rebound. No HSM. EXTR: No c/c/trace edema right lower extremity, wearing compression socks NEURO Normal gait.  PSYCH: Normally interactive. Conversant. Not depressed or anxious appearing.  Calm demeanor.    Assessment and Plan:   ICD-10-CM   1. Type 2 diabetes mellitus with hyperglycemia, without long-term current use of insulin ([facility])  E11.65 Comprehensive metabolic panel    Hemoglobin A1c    Insulin, random  2. Other hyperlipidemia  E78.49 Lipid panel  3. Essential hypertension  I10 CBC    Comprehensive metabolic panel  4. Screening for malignant neoplasm of prostate  Z12.5 PSA  5. Vitamin D deficiency  E55.9 Vitamin D  (25 hydroxy)  6. Idiopathic chronic gout of multiple sites with tophus  M1A.09X1 allopurinol (ZYLOPRIM) 300 MG tablet    Here today to establish care Due for A1c, drawn today Blood pressure under good control on current dose of lisinopril-he reports home blood pressure readings are typically lower than what we got today Will plan further  follow- up pending labs. He plans to see me for a physical in February Follow-up: No follow-ups on file.  Meds ordered this encounter  Medications  . allopurinol (ZYLOPRIM) 300 MG tablet    Sig: Take 1 tablet (300 mg total) by mouth daily.    Dispense:  90 tablet    Refill:  3   Orders Placed This Encounter  Procedures  . CBC  . Comprehensive metabolic panel  . Hemoglobin A1c  . Lipid panel  . PSA  . Vitamin D (25 hydroxy)  . Insulin, random    _0 @   Signed Lamar Blinks, MD  addnd 7/23- received his labs, message to pt   Results for orders placed or performed in visit on 12/29/18  CBC  Result Value Ref Range   WBC 8.2 4.0 - 10.5 K/uL   RBC 4.85 4.22 - 5.81 Mil/uL   Platelets 177.0 150.0 - 400.0 K/uL   Hemoglobin 15.0 13.0 - 17.0 g/dL   HCT 44.5 39.0 - 52.0 %   MCV 91.8 78.0 - 100.0 fl   MCHC 33.8 30.0 - 36.0 g/dL   RDW 13.3 11.5 - 15.5 %  Comprehensive metabolic panel  Result Value Ref Range   Sodium 138 135 - 145 mEq/L   Potassium 4.4 3.5 - 5.1 mEq/L   Chloride 105 96 - 112 mEq/L   CO2 24 19 - 32 mEq/L   Glucose, Bld 158 (H) 70 - 99 mg/dL   BUN 20 6 - 23 mg/dL   Creatinine, Ser 1.06 0.40 - 1.50 mg/dL   Total Bilirubin 0.4 0.2 - 1.2 mg/dL   Alkaline Phosphatase 53 39 - 117 U/L   AST 45 (H) 0 - 37 U/L   ALT 85 (H) 0 - 53 U/L   Total Protein 6.9 6.0 - 8.3 g/dL   Albumin 4.4 3.5 - 5.2 g/dL   Calcium 9.3 8.4 - 10.5 mg/dL   GFR 72.33 >60.00 mL/min  Hemoglobin A1c  Result Value Ref Range   Hgb A1c MFr Bld 7.6 (H) 4.6 - 6.5 %  Lipid panel  Result Value Ref Range   Cholesterol 213 (H) 0 - 200 mg/dL   Triglycerides  173.0 (H) 0.0 - 149.0 mg/dL   HDL 43.30 >39.00 mg/dL   VLDL 34.6 0.0 - 40.0 mg/dL   LDL Cholesterol 135 (H) 0 - 99 mg/dL   Total CHOL/HDL Ratio 5    NonHDL 169.75   PSA  Result Value Ref Range   PSA 0.41 0.10 - 4.00 ng/mL  Vitamin D (25 hydroxy)  Result Value Ref Range   VITD 32.52 30.00 - 100.00 ng/mL

## 2018-12-29 ENCOUNTER — Ambulatory Visit: Payer: BC Managed Care – PPO | Admitting: Family Medicine

## 2018-12-29 ENCOUNTER — Encounter: Payer: Self-pay | Admitting: Family Medicine

## 2018-12-29 ENCOUNTER — Other Ambulatory Visit: Payer: Self-pay

## 2018-12-29 VITALS — BP 134/78 | HR 85 | Temp 98.4°F | Resp 18 | Ht 68.0 in | Wt 271.0 lb

## 2018-12-29 DIAGNOSIS — E559 Vitamin D deficiency, unspecified: Secondary | ICD-10-CM

## 2018-12-29 DIAGNOSIS — I10 Essential (primary) hypertension: Secondary | ICD-10-CM | POA: Diagnosis not present

## 2018-12-29 DIAGNOSIS — Z125 Encounter for screening for malignant neoplasm of prostate: Secondary | ICD-10-CM

## 2018-12-29 DIAGNOSIS — M1A09X1 Idiopathic chronic gout, multiple sites, with tophus (tophi): Secondary | ICD-10-CM

## 2018-12-29 DIAGNOSIS — E7849 Other hyperlipidemia: Secondary | ICD-10-CM | POA: Diagnosis not present

## 2018-12-29 DIAGNOSIS — R7989 Other specified abnormal findings of blood chemistry: Secondary | ICD-10-CM

## 2018-12-29 DIAGNOSIS — E1165 Type 2 diabetes mellitus with hyperglycemia: Secondary | ICD-10-CM

## 2018-12-29 DIAGNOSIS — R945 Abnormal results of liver function studies: Secondary | ICD-10-CM

## 2018-12-29 MED ORDER — ALLOPURINOL 300 MG PO TABS: 300.0000 mg | ORAL_TABLET | Freq: Every day | ORAL | 3 refills | Status: DC

## 2018-12-29 NOTE — Patient Instructions (Addendum)
It was great to see you today- I will be in touch with your labs asap Please come and see me for a physical in about 6 months

## 2018-12-30 ENCOUNTER — Encounter: Payer: Self-pay | Admitting: Family Medicine

## 2018-12-30 LAB — CBC
HCT: 44.5 % (ref 39.0–52.0)
Hemoglobin: 15 g/dL (ref 13.0–17.0)
MCHC: 33.8 g/dL (ref 30.0–36.0)
MCV: 91.8 fl (ref 78.0–100.0)
Platelets: 177 10*3/uL (ref 150.0–400.0)
RBC: 4.85 Mil/uL (ref 4.22–5.81)
RDW: 13.3 % (ref 11.5–15.5)
WBC: 8.2 10*3/uL (ref 4.0–10.5)

## 2018-12-30 LAB — COMPREHENSIVE METABOLIC PANEL
ALT: 85 U/L — ABNORMAL HIGH (ref 0–53)
AST: 45 U/L — ABNORMAL HIGH (ref 0–37)
Albumin: 4.4 g/dL (ref 3.5–5.2)
Alkaline Phosphatase: 53 U/L (ref 39–117)
BUN: 20 mg/dL (ref 6–23)
CO2: 24 mEq/L (ref 19–32)
Calcium: 9.3 mg/dL (ref 8.4–10.5)
Chloride: 105 mEq/L (ref 96–112)
Creatinine, Ser: 1.06 mg/dL (ref 0.40–1.50)
GFR: 72.33 mL/min (ref >60.00)
Glucose, Bld: 158 mg/dL — ABNORMAL HIGH (ref 70–99)
Potassium: 4.4 mEq/L (ref 3.5–5.1)
Sodium: 138 mEq/L (ref 135–145)
Total Bilirubin: 0.4 mg/dL (ref 0.2–1.2)
Total Protein: 6.9 g/dL (ref 6.0–8.3)

## 2018-12-30 LAB — PSA: PSA: 0.41 ng/mL (ref 0.10–4.00)

## 2018-12-30 LAB — LIPID PANEL
Cholesterol: 213 mg/dL — ABNORMAL HIGH (ref 0–200)
HDL: 43.3 mg/dL (ref >39.00)
LDL Cholesterol: 135 mg/dL — ABNORMAL HIGH (ref 0–99)
NonHDL: 169.75
Total CHOL/HDL Ratio: 5
Triglycerides: 173 mg/dL — ABNORMAL HIGH (ref 0.0–149.0)
VLDL: 34.6 mg/dL (ref 0.0–40.0)

## 2018-12-30 LAB — INSULIN, RANDOM: Insulin: 28.3 u[IU]/mL — ABNORMAL HIGH

## 2018-12-30 LAB — HEMOGLOBIN A1C: Hgb A1c MFr Bld: 7.6 % — ABNORMAL HIGH (ref 4.6–6.5)

## 2018-12-30 LAB — VITAMIN D 25 HYDROXY (VIT D DEFICIENCY, FRACTURES): VITD: 32.52 ng/mL (ref 30.00–100.00)

## 2018-12-30 NOTE — Addendum Note (Signed)
Addended by: Lamar Blinks C on: 12/30/2018 11:53 AM   Modules accepted: Orders

## 2019-01-17 ENCOUNTER — Other Ambulatory Visit: Payer: Self-pay

## 2019-01-17 ENCOUNTER — Ambulatory Visit: Payer: BC Managed Care – PPO | Admitting: Family Medicine

## 2019-01-17 ENCOUNTER — Encounter: Payer: Self-pay | Admitting: Family Medicine

## 2019-01-17 VITALS — BP 148/82 | HR 86 | Temp 97.4°F | Resp 16 | Ht 68.0 in | Wt 277.0 lb

## 2019-01-17 DIAGNOSIS — M25532 Pain in left wrist: Secondary | ICD-10-CM | POA: Diagnosis not present

## 2019-01-17 MED ORDER — AMOXICILLIN-POT CLAVULANATE 875-125 MG PO TABS: 1.0000 | ORAL_TABLET | Freq: Two times a day (BID) | ORAL | 0 refills | Status: DC

## 2019-01-17 MED ORDER — COLCHICINE 0.6 MG PO TABS: ORAL_TABLET | ORAL | 0 refills | Status: DC

## 2019-01-17 MED ORDER — MITIGARE 0.6 MG PO CAPS: ORAL_CAPSULE | ORAL | 0 refills | Status: DC

## 2019-01-17 NOTE — Progress Notes (Addendum)
[facility] [place], [place], [place] [address] [phone]  Date:  01/17/2019   Name:  Steven Hodge   DOB:  1960-08-12   MRN:  [id number]  PCP:  Darreld Mclean, MD    Chief Complaint: Hand Pain (left hand/wrist, fell two weeks ago, swelling, 4 days, lost motion in wrist)   History of Present Illness:  Steven Hodge is a 58 y.o. very pleasant male patient who presents with the following:  History of DM, obesity, HTN Here today with concern of a left wrist issue Seen here just last month for routine follow-up   Today is Monday- on Thursday he noted pain in the left wrist It has gotten worse  He did fall a couple of weeks ago and caught himself on his wrist, but did not think he injured it at that time.  It did not start hurting until 10 days later  He got an infection in his hand in the past from an IV catheter  No fever or chills He otherwise feels ok - no body aches or flu like sx   He does have DM which is under moderate control He is taking mortrin His glucose is running higher- up to 192 yesterday, 232 this am   No other joint issues right now  He is taking allopurinol for history of gout  He had an ACL per Dr. French Ana years ago - does not currently see ortho however  Lab Results  Component Value Date   HGBA1C 7.6 (H) 12/29/2018     Patient Active Problem List   Diagnosis Date Noted  . Type 2 diabetes mellitus with hyperglycemia, without long-term current use of insulin ([place]) 07/06/2017  . 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  . Vitamin D deficiency 11/18/2016  . Class 2 obesity without serious comorbidity with body mass index (BMI) of 39.0 to 39.9 in adult 11/04/2016  . Essential hypertension 09/18/2016  . Morbid obesity ([facility]) 08/20/2016  . History of colonic polyps 08/20/2016    Past Medical History:  Diagnosis Date  . Back pain   . Cancer ([facility]) 06/09/2006    Basal cell carcinoma scalp;   [name] Diabetes mellitus without complication ([facility])   . Gout   . Hx of blood clots   . Hypertension     Past Surgical History:  Procedure Laterality Date  . basel cell cancer removed  2009  . KNEE SURGERY  2005   ACL repair R    Social History   Tobacco Use  . Smoking status: Former Research scientist (life sciences)  . Smokeless tobacco: Never Used  Substance Use Topics  . Alcohol use: No  . Drug use: No    Family History  Problem Relation Age of Onset  . Diabetes Mother   . Liver disease Mother   . Obesity Mother   . Cancer Father 73       lung cancer  . Diabetes Father   . Cancer Sister 62       lung cancer  . Heart disease Brother 66       cardiac stenting/CAD  . Cancer Brother        skin cancer  . Arthritis Brother     No Known Allergies  Medication list has been reviewed and updated.  Current Outpatient Medications on File Prior to Visit  Medication Sig Dispense Refill  . allopurinol (ZYLOPRIM) 300 MG tablet Take 1  tablet (300 mg total) by mouth daily. 90 tablet 3  . aspirin 325 MG tablet Take 325 mg by mouth daily.    . blood glucose meter kit and supplies Dispense based on patient and insurance preference. Use up to four times daily as directed. (FOR ICD-9 250.00, 250.01). 1 each 0  . Cinnamon 500 MG TABS Take 1,000 mg by mouth daily.    . fish oil-omega-3 fatty acids 1000 MG capsule Take 2 g by mouth daily.    [name] gabapentin (NEURONTIN) 300 MG capsule Take 1 capsule (300 mg total) by mouth 3 (three) times daily. 30 capsule 3  . glucosamine-chondroitin 500-400 MG tablet Take 1 tablet by mouth once.    [name] glucose blood (CONTOUR NEXT TEST) test strip USE TO CHECK BLOOD SUGAR UP TO 4 TIMES ADAY 100 each 0  . Insulin Pen Needle (SURE COMFORT PEN NEEDLES) 32G X 4 MM MISC USE FOR VICTOZA INJECTIONS 100 each 0  . lisinopril (ZESTRIL) 5 MG tablet Take 1 tablet (5 mg total) by mouth daily. 30 tablet 0  . metFORMIN (GLUCOPHAGE) 500 MG tablet Take 1 tablet (500 mg  total) by mouth 3 (three) times daily. 90 tablet 0  . Multiple Vitamin (MULTIVITAMIN) tablet Take 1 tablet by mouth daily.    . polyethylene glycol powder (GLYCOLAX/MIRALAX) powder Take 17 g by mouth daily. 3350 g 0  . sitaGLIPtin (JANUVIA) 100 MG tablet Take 1 tablet (100 mg total) by mouth daily. 30 tablet 0  . TURMERIC PO Take 1,000 mg by mouth 2 (two) times daily.    . Vitamin D, Ergocalciferol, (DRISDOL) 1.25 MG (50000 UT) CAPS capsule Take 1 capsule (50,000 Units total) by mouth every 7 (seven) days. 4 capsule 0   No current facility-administered medications on file prior to visit.     Review of Systems:  As per HPI- otherwise negative. No fever or chills  Physical Examination: Vitals:   01/17/19 1431  BP: (!) 148/82  Pulse: 86  Resp: 16  Temp: (!) 97.4 F (36.3 C)  SpO2: 98%   Vitals:   01/17/19 1431  Weight: 277 lb (125.6 kg)  Height: '5\' 8"'  (1.727 m)   Body mass index is 42.12 kg/m. Ideal Body Weight: Weight in (lb) to have BMI = 25: 164.1  GEN: WDWN, NAD, Non-toxic, A & O x 3, obese, looks well  HEENT: Atraumatic, Normocephalic. Neck supple. No masses, No LAD. Ears and Nose: No external deformity. CV: RRR, No M/G/R. No JVD. No thrill. No extra heart sounds. PULM: CTA B, no wheezes, crackles, rhonchi. No retractions. No resp. distress. No accessory muscle use. ABD: S, NT, ND, +BS. No rebound. No HSM. EXTR: No c/c/e NEURO Normal gait.  PSYCH: Normally interactive. Conversant. Not depressed or anxious appearing.  Calm demeanor.  Left wrist: the dorsum of the left wrist is mildly erythematous and warm, and tender.  Mildly swollen. Some pain with ROM of the wrist.  Hand and elbow are negative.  Hand is warm and well perfused with strong pulses and normal cap refill No visible wound or break in the skin   Assessment and Plan:   ICD-10-CM   1. Left wrist pain  M25.532 amoxicillin-clavulanate (AUGMENTIN) 875-125 MG tablet    CBC    Basic metabolic panel    Uric  acid    DISCONTINUED: colchicine 0.6 MG tablet     Follow-up: No follow-ups on file.  Meds ordered this encounter  Medications  . amoxicillin-clavulanate (AUGMENTIN) 875-125 MG tablet  Sig: Take 1 tablet by mouth 2 (two) times daily.    Dispense:  20 tablet    Refill:  0  . DISCONTD: colchicine 0.6 MG tablet    Sig: Take 2 pills once, then 1 pill an hour later for gout.    Dispense:  30 tablet    Refill:  0   Orders Placed This Encounter  Procedures  . CBC  . Basic metabolic panel  . Uric acid    '@SIGN' @  Outpatient Encounter Medications as of 01/17/2019  Medication Sig  . allopurinol (ZYLOPRIM) 300 MG tablet Take 1 tablet (300 mg total) by mouth daily.  [name] aspirin 325 MG tablet Take 325 mg by mouth daily.  . blood glucose meter kit and supplies Dispense based on patient and insurance preference. Use up to four times daily as directed. (FOR ICD-9 250.00, 250.01).  . Cinnamon 500 MG TABS Take 1,000 mg by mouth daily.  . fish oil-omega-3 fatty acids 1000 MG capsule Take 2 g by mouth daily.  [name] gabapentin (NEURONTIN) 300 MG capsule Take 1 capsule (300 mg total) by mouth 3 (three) times daily.  [name] glucosamine-chondroitin 500-400 MG tablet Take 1 tablet by mouth once.  [name] glucose blood (CONTOUR NEXT TEST) test strip USE TO CHECK BLOOD SUGAR UP TO 4 TIMES ADAY  . Insulin Pen Needle (SURE COMFORT PEN NEEDLES) 32G X 4 MM MISC USE FOR VICTOZA INJECTIONS  . lisinopril (ZESTRIL) 5 MG tablet Take 1 tablet (5 mg total) by mouth daily.  . metFORMIN (GLUCOPHAGE) 500 MG tablet Take 1 tablet (500 mg total) by mouth 3 (three) times daily.  . Multiple Vitamin (MULTIVITAMIN) tablet Take 1 tablet by mouth daily.  . polyethylene glycol powder (GLYCOLAX/MIRALAX) powder Take 17 g by mouth daily.  . sitaGLIPtin (JANUVIA) 100 MG tablet Take 1 tablet (100 mg total) by mouth daily.  . TURMERIC PO Take 1,000 mg by mouth 2 (two) times daily.  . Vitamin D, Ergocalciferol, (DRISDOL) 1.25 MG (50000 UT) CAPS  capsule Take 1 capsule (50,000 Units total) by mouth every 7 (seven) days.  [name] amoxicillin-clavulanate (AUGMENTIN) 875-125 MG tablet Take 1 tablet by mouth 2 (two) times daily.  . [DISCONTINUED] colchicine 0.6 MG tablet Take 2 pills once, then 1 pill an hour later for gout.   No facility-administered encounter medications on file as of 01/17/2019.    Here today with left wrist pain, swelling and redness Likely gout, but also consider joint infection Colchicine, augmentin  Made him an ortho follow-up appt at [facility] pending   Signed Lamar Blinks, MD Dr Rip Harbour tomorrow at 9 am  Received his labs 8/11, message to patient  Results for orders placed or performed in visit on 01/17/19  CBC  Result Value Ref Range   WBC 8.0 4.0 - 10.5 K/uL   RBC 4.66 4.22 - 5.81 Mil/uL   Platelets 156.0 150.0 - 400.0 K/uL   Hemoglobin 14.4 13.0 - 17.0 g/dL   HCT 42.7 39.0 - 52.0 %   MCV 91.6 78.0 - 100.0 fl   MCHC 33.8 30.0 - 36.0 g/dL   RDW 13.3 11.5 - 00.8 %  Basic metabolic panel  Result Value Ref Range   Sodium 138 135 - 145 mEq/L   Potassium 4.3 3.5 - 5.1 mEq/L   Chloride 107 96 - 112 mEq/L   CO2 23 19 - 32 mEq/L   Glucose, Bld 211 (H) 70 - 99 mg/dL   BUN 21 6 - 23 mg/dL   Creatinine,  Ser 0.82 0.40 - 1.50 mg/dL   Calcium 9.2 8.4 - 10.5 mg/dL   GFR 97.25 >60.00 mL/min  Uric acid  Result Value Ref Range   Uric Acid, Serum 5.1 4.0 - 7.8 mg/dL

## 2019-01-17 NOTE — Patient Instructions (Signed)
You have an appt with Dr. Rip Harbour tomorrow am at 9 am- please arrive around 8:30 am   Address: [address] [place], [place] [address]  [phone]  You likely have gout, but a joint infection is another consideration.  We are going to treat you for gout with colchicine- take 2 pills once, then 1 pill an hour later Also start on augmentin in case of infection  If any severe worsening overnight please go to the ER Otherwise I am hoping you will be much better soon I will be in touch with your labs asap

## 2019-01-18 ENCOUNTER — Encounter: Payer: Self-pay | Admitting: Family Medicine

## 2019-01-18 DIAGNOSIS — M25532 Pain in left wrist: Secondary | ICD-10-CM | POA: Diagnosis not present

## 2019-01-18 LAB — BASIC METABOLIC PANEL
BUN: 21 mg/dL (ref 6–23)
CO2: 23 mEq/L (ref 19–32)
Calcium: 9.2 mg/dL (ref 8.4–10.5)
Chloride: 107 mEq/L (ref 96–112)
Creatinine, Ser: 0.82 mg/dL (ref 0.40–1.50)
GFR: 97.25 mL/min (ref >60.00)
Glucose, Bld: 211 mg/dL — ABNORMAL HIGH (ref 70–99)
Potassium: 4.3 mEq/L (ref 3.5–5.1)
Sodium: 138 mEq/L (ref 135–145)

## 2019-01-18 LAB — CBC
HCT: 42.7 % (ref 39.0–52.0)
Hemoglobin: 14.4 g/dL (ref 13.0–17.0)
MCHC: 33.8 g/dL (ref 30.0–36.0)
MCV: 91.6 fl (ref 78.0–100.0)
Platelets: 156 10*3/uL (ref 150.0–400.0)
RBC: 4.66 Mil/uL (ref 4.22–5.81)
RDW: 13.3 % (ref 11.5–15.5)
WBC: 8 10*3/uL (ref 4.0–10.5)

## 2019-01-18 LAB — URIC ACID: Uric Acid, Serum: 5.1 mg/dL (ref 4.0–7.8)

## 2019-01-19 DIAGNOSIS — M25532 Pain in left wrist: Secondary | ICD-10-CM | POA: Diagnosis not present

## 2019-01-20 ENCOUNTER — Telehealth (INDEPENDENT_AMBULATORY_CARE_PROVIDER_SITE_OTHER): Payer: BC Managed Care – PPO | Admitting: Family Medicine

## 2019-01-20 ENCOUNTER — Other Ambulatory Visit: Payer: Self-pay

## 2019-01-20 ENCOUNTER — Encounter (INDEPENDENT_AMBULATORY_CARE_PROVIDER_SITE_OTHER): Payer: Self-pay | Admitting: Family Medicine

## 2019-01-20 DIAGNOSIS — E118 Type 2 diabetes mellitus with unspecified complications: Secondary | ICD-10-CM

## 2019-01-20 DIAGNOSIS — E559 Vitamin D deficiency, unspecified: Secondary | ICD-10-CM | POA: Diagnosis not present

## 2019-01-20 DIAGNOSIS — I10 Essential (primary) hypertension: Secondary | ICD-10-CM

## 2019-01-20 DIAGNOSIS — E7849 Other hyperlipidemia: Secondary | ICD-10-CM | POA: Diagnosis not present

## 2019-01-20 DIAGNOSIS — Z6841 Body Mass Index (BMI) 40.0 and over, adult: Secondary | ICD-10-CM

## 2019-01-20 MED ORDER — CONTOUR NEXT TEST VI STRP: ORAL_STRIP | 0 refills | Status: DC

## 2019-01-20 MED ORDER — BLOOD GLUCOSE METER KIT: PACK | 0 refills | Status: AC

## 2019-01-20 MED ORDER — LISINOPRIL 5 MG PO TABS: 5.0000 mg | ORAL_TABLET | Freq: Every day | ORAL | 0 refills | Status: DC

## 2019-01-20 MED ORDER — SITAGLIPTIN PHOSPHATE 100 MG PO TABS: 100.0000 mg | ORAL_TABLET | Freq: Every day | ORAL | 0 refills | Status: DC

## 2019-01-20 MED ORDER — VITAMIN D (ERGOCALCIFEROL) 1.25 MG (50000 UNIT) PO CAPS: 50000.0000 [IU] | ORAL_CAPSULE | ORAL | 0 refills | Status: DC

## 2019-01-21 ENCOUNTER — Encounter: Payer: Self-pay | Admitting: Family Medicine

## 2019-01-24 NOTE — Progress Notes (Signed)
Office: [phone]  /  Fax: [phone] TeleHealth Visit:  Steven Hodge has verbally consented to this TeleHealth visit today. The patient is located at home, the provider is located at the [facility] office. The participants in this visit include the listed provider and patient. Steven Hodge was unable to use doxy.me today and the telehealth visit was conducted via telephone.  HPI:   Chief Complaint: OBESITY [place] is here to discuss his progress with his obesity treatment plan. He is on the Category 3 plan and is following his eating plan approximately 100 % of the time. He states he is exercising 0 minutes 0 times per week. Steven Hodge states that he is following his 1500 calorie diet prescription 100%, but he has not lost weight. He feels that he is retaining fluid to recent gout flare. 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  We were unable to weigh the patient today for this TeleHealth visit. He feels as if he has maintained weight since his last visit. He has lost 0 lbs since starting treatment with [name].  Diabetes II Steven Hodge has a diagnosis of diabetes type II. Steven Hodge states that his fasting blood sugar is averaging 150 to 200 recently, but has gone higher after his recent gout flare and cortisone injection. His A1c has increased to 7.6 on 12/29/18 and he had been on metformin and Januvia. He has been working on intensive lifestyle modifications including diet, exercise, and weight loss to help control his blood glucose levels.  Hypertension Steven Hodge is a 58 y.o. male with hypertension. Steven Hodge's blood pressure is currently stable per the patient. He is working on weight loss to help control his blood pressure with the goal of decreasing his risk of heart attack and stroke. Steven Hodge denies chest pain, headache, or dizziness.  Vitamin D Deficiency Derin has a diagnosis of vitamin D deficiency. He is currently stable on vit D, but is not  yet at goal. Steven Hodge Resides denies nausea, vomiting, or muscle weakness.  Hyperlipidemia Steven Hodge has hyperlipidemia and stopped his statin due to myalgias. His LDL is elevated and is no longer controlled. He has been trying to improve his cholesterol levels with intensive lifestyle modification including a low saturated fat diet, exercise, and weight loss.  ASSESSMENT AND PLAN:  Type 2 diabetes mellitus with hyperglycemia, without long-term current use of insulin (Steven Hodge) - Plan: sitaGLIPtin (JANUVIA) 100 MG tablet, glucose blood (CONTOUR NEXT TEST) test strip  Type 2 diabetes mellitus with complication (Steven Hodge) - Plan: blood glucose meter kit and supplies  Essential hypertension - Plan: lisinopril (ZESTRIL) 5 MG tablet  Vitamin D deficiency - Plan: Vitamin D, Ergocalciferol, (DRISDOL) 1.25 MG (50000 UT) CAPS capsule  Other hyperlipidemia  Class 3 severe obesity with serious comorbidity and body mass index (BMI) of 40.0 to 44.9 in adult, unspecified obesity type ([facility])  PLAN:  Diabetes II Steven Hodge has been given extensive diabetes education by myself today including ideal fasting and post-prandial blood glucose readings, individual ideal Hgb A1c, goals and hypoglycemia prevention. We discussed the importance of good blood sugar control to decrease the likelihood of diabetic complications such as nephropathy, neuropathy, limb loss, blindness, coronary artery disease, and death. We discussed the importance of intensive lifestyle modification including diet, exercise, and weight loss as the first line treatment for diabetes. Steven Hodge agrees to continue his Januvia 100 mg qd #30 with no refills and will monitor his blood sugar with a blood  glucose meter and test strips #100 with no refills. He will follow up at the agreed upon time in 4 weeks.  Hypertension We discussed sodium restriction, working on healthy weight loss, and a regular exercise program as the means to achieve improved blood pressure control. We will  continue to monitor his blood pressure as well as his progress with the above lifestyle modifications. He will continue his lisinopril 50 mg qd #30 with no refills and will watch for signs of hypotension as he continues his lifestyle modifications. Mead agreed to continue his diet and agreed to follow up as directed.  Vitamin D Deficiency Steven Hodge was informed that low vitamin D levels contribute to fatigue and are associated with obesity, breast, and colon cancer. Steven Hodge agrees to continue to take prescription Vit D '@50' ,000 IU every week #4 with no refills and will follow up for routine testing of vitamin D, at least 2-3 times per year. He was informed of the risk of over-replacement of vitamin D and agrees to not increase his dose unless he discusses this with [name] first. Steven Hodge agrees to follow up in 4 weeks as directed.  Hyperlipidemia Steven Hodge was informed of the American Heart Association Guidelines emphasizing intensive lifestyle modifications as the first line treatment for hyperlipidemia. We discussed many lifestyle modifications today in depth, and Steven Hodge will continue to work on decreasing saturated fats such as fatty red meat, butter and many fried foods. He will also increase vegetables and lean protein in his diet and continue to work on exercise and weight loss efforts. Eliga has agreed to start CoQ10 300 mg OTC qd and will follow up at the agreed upon time in 4 weeks.  Obesity Steven Hodge is currently in the action stage of change. As such, his goal is to continue with weight loss efforts. He has agreed to follow the Category 3 plan. Steven Hodge has been instructed to work up to a goal of 150 minutes of combined cardio and strengthening exercise per week for weight loss and overall health benefits. We discussed the following Behavioral Modification Strategies today: increasing lean protein intake, no skipping meals, and decrease eating out.  Steven Hodge has agreed to follow up with our clinic in 4 weeks. He was  informed of the importance of frequent follow up visits to maximize his success with intensive lifestyle modifications for his multiple health conditions.  ALLERGIES: No Known Allergies  MEDICATIONS: Current Outpatient Medications on File Prior to Visit  Medication Sig Dispense Refill  . allopurinol (ZYLOPRIM) 300 MG tablet Take 1 tablet (300 mg total) by mouth daily. 90 tablet 3  . amoxicillin-clavulanate (AUGMENTIN) 875-125 MG tablet Take 1 tablet by mouth 2 (two) times daily. 20 tablet 0  . aspirin 325 MG tablet Take 325 mg by mouth daily.    . Cinnamon 500 MG TABS Take 1,000 mg by mouth daily.    . fish oil-omega-3 fatty acids 1000 MG capsule Take 2 g by mouth daily.    [name] gabapentin (NEURONTIN) 300 MG capsule Take 1 capsule (300 mg total) by mouth 3 (three) times daily. 30 capsule 3  . glucosamine-chondroitin 500-400 MG tablet Take 1 tablet by mouth once.    . metFORMIN (GLUCOPHAGE) 500 MG tablet Take 1 tablet (500 mg total) by mouth 3 (three) times daily. 90 tablet 0  . MITIGARE 0.6 MG CAPS Take 2 capsule once, then 1 pill an hour later for gout 30 capsule 0  . Multiple Vitamin (MULTIVITAMIN) tablet Take 1 tablet by mouth daily.    [name]  polyethylene glycol powder (GLYCOLAX/MIRALAX) powder Take 17 g by mouth daily. 3350 g 0  . TURMERIC PO Take 1,000 mg by mouth 2 (two) times daily.     No current facility-administered medications on file prior to visit.     PAST MEDICAL HISTORY: Past Medical History:  Diagnosis Date  . Back pain   . Cancer ([facility]) 06/09/2006   Basal cell carcinoma scalp;   [name] Diabetes mellitus without complication ([place])   . Gout   . Hx of blood clots   . Hypertension     PAST SURGICAL HISTORY: Past Surgical History:  Procedure Laterality Date  . basel cell cancer removed  2009  . KNEE SURGERY  2005   ACL repair R    SOCIAL HISTORY: Social History   Tobacco Use  . Smoking status: Former Research scientist (life sciences)  . Smokeless tobacco: Never Used  Substance Use Topics  .  Alcohol use: No  . Drug use: No    FAMILY HISTORY: Family History  Problem Relation Age of Onset  . Diabetes Mother   . Liver disease Mother   . Obesity Mother   . Cancer Father 40       lung cancer  . Diabetes Father   . Cancer Sister 72       lung cancer  . Heart disease Brother 32       cardiac stenting/CAD  . Cancer Brother        skin cancer  . Arthritis Brother     ROS: Review of Systems  Cardiovascular: Negative for chest pain.  Gastrointestinal: Negative for nausea and vomiting.  Musculoskeletal: Positive for myalgias.       Negative for muscle weakness.  Neurological: Negative for dizziness and headaches.    PHYSICAL EXAM: Pt in no acute distress  RECENT LABS AND TESTS: BMET    Component Value Date/Time   NA 138 01/17/2019 1513   NA 141 02/23/2018 0814   K 4.3 01/17/2019 1513   CL 107 01/17/2019 1513   CO2 23 01/17/2019 1513   GLUCOSE 211 (H) 01/17/2019 1513   BUN 21 01/17/2019 1513   BUN 19 02/23/2018 0814   CREATININE 0.82 01/17/2019 1513   CREATININE 0.88 04/22/2016 1349   CALCIUM 9.2 01/17/2019 1513   GFRNONAA 97 02/23/2018 0814   GFRAA 113 02/23/2018 0814   Lab Results  Component Value Date   HGBA1C 7.6 (H) 12/29/2018   HGBA1C 6.7 (H) 06/24/2018   HGBA1C 6.3 (H) 02/23/2018   HGBA1C 6.1 (H) 10/12/2017   HGBA1C 6.4 (H) 06/03/2017   Lab Results  Component Value Date   INSULIN 26.7 (H) 06/24/2018   INSULIN 28.0 (H) 02/23/2018   INSULIN 31.8 (H) 10/12/2017   INSULIN 30.2 (H) 02/12/2017   INSULIN 32.4 (H) 09/18/2016   CBC    Component Value Date/Time   WBC 8.0 01/17/2019 1513   RBC 4.66 01/17/2019 1513   HGB 14.4 01/17/2019 1513   HGB 15.3 11/06/2017 1717   HCT 42.7 01/17/2019 1513   HCT 44.2 11/06/2017 1717   PLT 156.0 01/17/2019 1513   PLT 207 11/06/2017 1717   MCV 91.6 01/17/2019 1513   MCV 86 11/06/2017 1717   MCH 29.8 11/06/2017 1717   MCH 31.5 (A) 11/25/2016 1119   MCH 30.9 04/22/2016 1349   MCHC 33.8 01/17/2019 1513    RDW 13.3 01/17/2019 1513   RDW 14.1 11/06/2017 1717   LYMPHSABS 2.5 11/06/2017 1717   MONOABS 656 04/22/2016 1349   EOSABS 0.2 11/06/2017 1717  BASOSABS 0.0 11/06/2017 1717   Iron/TIBC/Ferritin/ %Sat No results found for: IRON, TIBC, FERRITIN, IRONPCTSAT Lipid Panel     Component Value Date/Time   CHOL 213 (H) 12/29/2018 1628   CHOL 146 06/24/2018 0956   TRIG 173.0 (H) 12/29/2018 1628   HDL 43.30 12/29/2018 1628   HDL 45 06/24/2018 0956   CHOLHDL 5 12/29/2018 1628   VLDL 34.6 12/29/2018 1628   LDLCALC 135 (H) 12/29/2018 1628   LDLCALC 78 06/24/2018 0956   Hepatic Function Panel     Component Value Date/Time   PROT 6.9 12/29/2018 1628   PROT 6.9 02/23/2018 0814   ALBUMIN 4.4 12/29/2018 1628   ALBUMIN 4.3 02/23/2018 0814   AST 45 (H) 12/29/2018 1628   ALT 85 (H) 12/29/2018 1628   ALKPHOS 53 12/29/2018 1628   BILITOT 0.4 12/29/2018 1628   BILITOT 0.4 02/23/2018 0814      Component Value Date/Time   TSH 2.230 09/18/2016 0956   TSH 2.44 04/22/2016 1349   TSH 1.731 03/09/2015 1524   Results for JADIEN, LEHIGH (MRN [id number]) as of 01/24/2019 15:33  Ref. Range 06/24/2018 09:56  Vitamin D, 25-Hydroxy Latest Ref Range: 30.0 - 100.0 ng/mL 40.3     I, Marcille Blanco, CMA, am acting as transcriptionist for Starlyn Skeans, MD I have reviewed the above documentation for accuracy and completeness, and I agree with the above. -Dennard Nip, MD

## 2019-02-16 ENCOUNTER — Telehealth (INDEPENDENT_AMBULATORY_CARE_PROVIDER_SITE_OTHER): Payer: BC Managed Care – PPO | Admitting: Family Medicine

## 2019-02-16 ENCOUNTER — Other Ambulatory Visit: Payer: Self-pay

## 2019-02-16 ENCOUNTER — Encounter (INDEPENDENT_AMBULATORY_CARE_PROVIDER_SITE_OTHER): Payer: Self-pay | Admitting: Family Medicine

## 2019-02-16 DIAGNOSIS — E118 Type 2 diabetes mellitus with unspecified complications: Secondary | ICD-10-CM

## 2019-02-16 DIAGNOSIS — E559 Vitamin D deficiency, unspecified: Secondary | ICD-10-CM

## 2019-02-16 DIAGNOSIS — E1165 Type 2 diabetes mellitus with hyperglycemia: Secondary | ICD-10-CM | POA: Diagnosis not present

## 2019-02-16 DIAGNOSIS — I10 Essential (primary) hypertension: Secondary | ICD-10-CM | POA: Diagnosis not present

## 2019-02-16 MED ORDER — SITAGLIPTIN PHOSPHATE 100 MG PO TABS: 100.0000 mg | ORAL_TABLET | Freq: Every day | ORAL | 0 refills | Status: DC

## 2019-02-16 MED ORDER — METFORMIN HCL 500 MG PO TABS: 500.0000 mg | ORAL_TABLET | Freq: Three times a day (TID) | ORAL | 0 refills | Status: DC

## 2019-02-16 MED ORDER — LISINOPRIL 5 MG PO TABS: 5.0000 mg | ORAL_TABLET | Freq: Every day | ORAL | 0 refills | Status: DC

## 2019-02-16 MED ORDER — VITAMIN D (ERGOCALCIFEROL) 1.25 MG (50000 UNIT) PO CAPS: 50000.0000 [IU] | ORAL_CAPSULE | ORAL | 0 refills | Status: DC

## 2019-02-16 NOTE — Progress Notes (Signed)
Office: [phone]  /  Fax: [phone] TeleHealth Visit:  Steven Hodge has verbally consented to this TeleHealth visit today. The patient is located at work, the provider is located at the [employer] [facility] office. The participants in this visit include the listed provider and patient. The visit was conducted today via FaceTime.  HPI:   Chief Complaint: OBESITY [name] is here to discuss his progress with his obesity treatment plan. He is following a modified Category 3 plan and is following his eating plan approximately 100% of the time. He states he is exercising 0 minutes 0 times per week. [name] feels he has done well with his eating plan and thinks he has lost 2-3 lbs since our last visit. His weight this a.m. was 266 lbs. 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 We were unable to weigh the patient today for this TeleHealth visit. He feels as if he has lost weight since his last visit. He has lost 0 lbs since starting treatment with [name].  Diabetes II [name] has a diagnosis of diabetes type II. [name] had a cortisone shot in his wrist 3 weeks ago and his blood sugar increased to the mid-200's; now his sugar has decreased to 140-170's fasting. Last A1c was 7.6 on 12/29/2018. He has been working on intensive lifestyle modifications including diet, exercise, and weight loss to help control his blood glucose levels.  Hypertension [name] is a 58 y.o. male with hypertension. [name] denies chest pain, headache, or dizziness. He is doing well with weight loss to help control his blood pressure with the goal of decreasing his risk of heart attack and stroke. [name]'s blood pressure is stable on medications.  Vitamin D deficiency [name] has a diagnosis of Vitamin D deficiency. He is currently stable on prescription Vit D and denies nausea, vomiting or muscle weakness. [name] is due for labs.  ASSESSMENT AND PLAN:  Type 2 diabetes  mellitus with hyperglycemia, without long-term current use of insulin ([place]) - Plan: metFORMIN (GLUCOPHAGE) 500 MG tablet  Type 2 diabetes mellitus with complication ([place]) - Plan: sitaGLIPtin (JANUVIA) 100 MG tablet  Vitamin D deficiency - Plan: Vitamin D, Ergocalciferol, (DRISDOL) 1.25 MG (50000 UT) CAPS capsule  Essential hypertension - Plan: lisinopril (ZESTRIL) 5 MG tablet  PLAN:  Diabetes II [name] has been given extensive diabetes education by myself today including ideal fasting and post-prandial blood glucose readings, individual ideal HgA1c goals  and hypoglycemia prevention. We discussed the importance of good blood sugar control to decrease the likelihood of diabetic complications such as nephropathy, neuropathy, limb loss, blindness, coronary artery disease, and death. We discussed the importance of intensive lifestyle modification including diet, exercise and weight loss as the first line treatment for diabetes. [name] was given refills on his metformin and Januvia. He agrees to follow-up with our clinic in 4 weeks.  Hypertension We discussed sodium restriction, working on healthy weight loss, and a regular exercise program as the means to achieve improved blood pressure control. [name] agreed with this plan and agreed to follow up as directed. We will continue to monitor his blood pressure as well as his progress with the above lifestyle modifications. [name] was given a refill on his lisinopril 5 mg #30 with 0 refills and he agrees to follow-up with our clinic in 4 weeks.  He will watch for signs of hypotension as he continues his lifestyle modifications.  Vitamin D Deficiency  [name] was informed that low Vitamin D levels contributes to fatigue and are associated with obesity, breast, and colon cancer. He agrees to continue to take prescription Vit D @ 50,000 IU every week #4 with 0 refills and will follow-up for routine testing of Vitamin D, at least 2-3 times per year. He was informed of  the risk of over-replacement of Vitamin D and agrees to not increase his dose unless he discusses this with [name] first. [name] agrees to follow-up with our clinic in 4 weeks.  Obesity [name] is currently in the action stage of change. As such, his goal is to continue with weight loss efforts. He has agreed to follow the Category 3 plan. [name] has been instructed to work up to a goal of 150 minutes of combined cardio and strengthening exercise per week for weight loss and overall health benefits. We discussed the following Behavioral Modification Strategies today: no skipping meals, work on meal planning and easy cooking plans.  [name] has agreed to follow-up with our clinic in 4 weeks in-office fasting appointment. He was informed of the importance of frequent follow-up visits to maximize his success with intensive lifestyle modifications for his multiple health conditions.  ALLERGIES: No Known Allergies  MEDICATIONS: Current Outpatient Medications on File Prior to Visit  Medication Sig Dispense Refill  . allopurinol (ZYLOPRIM) 300 MG tablet Take 1 tablet (300 mg total) by mouth daily. 90 tablet 3  . aspirin 325 MG tablet Take 325 mg by mouth daily.    . blood glucose meter kit and supplies Dispense based on patient and insurance preference. Use up to four times daily as directed. (FOR ICD-9 250.00, 250.01). 1 each 0  . Cinnamon 500 MG TABS Take 1,000 mg by mouth daily.    . fish oil-omega-3 fatty acids 1000 MG capsule Take 2 g by mouth daily.    [name] gabapentin (NEURONTIN) 300 MG capsule Take 1 capsule (300 mg total) by mouth 3 (three) times daily. 30 capsule 3  . glucosamine-chondroitin 500-400 MG tablet Take 1 tablet by mouth once.    [name] glucose blood (CONTOUR NEXT TEST) test strip USE TO CHECK BLOOD SUGAR UP TO 4 TIMES ADAY 100 each 0  . MITIGARE 0.6 MG CAPS Take 2 capsule once, then 1 pill an hour later for gout 30 capsule 0  . Multiple Vitamin (MULTIVITAMIN) tablet Take 1 tablet by mouth  daily.    . polyethylene glycol powder (GLYCOLAX/MIRALAX) powder Take 17 g by mouth daily. 3350 g 0  . TURMERIC PO Take 1,000 mg by mouth 2 (two) times daily.     No current facility-administered medications on file prior to visit.     PAST MEDICAL HISTORY: Past Medical History:  Diagnosis Date  . Back pain   . Cancer ([place]) 06/09/2006   Basal cell carcinoma scalp;   [name] Diabetes mellitus without complication ([place])   . Gout   . Hx of blood clots   . Hypertension     PAST SURGICAL HISTORY: Past Surgical History:  Procedure Laterality Date  . basel cell cancer removed  2009  . KNEE SURGERY  2005   ACL repair R    SOCIAL HISTORY: Social History   Tobacco Use  . Smoking status: Former Research scientist (life sciences)  . Smokeless tobacco: Never Used  Substance Use Topics  . Alcohol use: No  . Drug use: No    FAMILY HISTORY: Family History  Problem Relation Age of Onset  . Diabetes Mother   . Liver disease Mother   .  Obesity Mother   . Cancer Father 78       lung cancer  . Diabetes Father   . Cancer Sister 25       lung cancer  . Heart disease Brother 46       cardiac stenting/CAD  . Cancer Brother        skin cancer  . Arthritis Brother    ROS: Review of Systems  Cardiovascular: Negative for chest pain.  Gastrointestinal: Negative for nausea and vomiting.  Musculoskeletal:       Negative for muscle weakness.  Neurological: Negative for dizziness and headaches.   PHYSICAL EXAM: Pt in no acute distress  RECENT LABS AND TESTS: BMET    Component Value Date/Time   NA 138 01/17/2019 1513   NA 141 02/23/2018 0814   K 4.3 01/17/2019 1513   CL 107 01/17/2019 1513   CO2 23 01/17/2019 1513   GLUCOSE 211 (H) 01/17/2019 1513   BUN 21 01/17/2019 1513   BUN 19 02/23/2018 0814   CREATININE 0.82 01/17/2019 1513   CREATININE 0.88 04/22/2016 1349   CALCIUM 9.2 01/17/2019 1513   GFRNONAA 97 02/23/2018 0814   GFRAA 113 02/23/2018 0814   Lab Results  Component Value Date   HGBA1C 7.6 (H)  12/29/2018   HGBA1C 6.7 (H) 06/24/2018   HGBA1C 6.3 (H) 02/23/2018   HGBA1C 6.1 (H) 10/12/2017   HGBA1C 6.4 (H) 06/03/2017   Lab Results  Component Value Date   INSULIN 26.7 (H) 06/24/2018   INSULIN 28.0 (H) 02/23/2018   INSULIN 31.8 (H) 10/12/2017   INSULIN 30.2 (H) 02/12/2017   INSULIN 32.4 (H) 09/18/2016   CBC    Component Value Date/Time   WBC 8.0 01/17/2019 1513   RBC 4.66 01/17/2019 1513   HGB 14.4 01/17/2019 1513   HGB 15.3 11/06/2017 1717   HCT 42.7 01/17/2019 1513   HCT 44.2 11/06/2017 1717   PLT 156.0 01/17/2019 1513   PLT 207 11/06/2017 1717   MCV 91.6 01/17/2019 1513   MCV 86 11/06/2017 1717   MCH 29.8 11/06/2017 1717   MCH 31.5 (A) 11/25/2016 1119   MCH 30.9 04/22/2016 1349   MCHC 33.8 01/17/2019 1513   RDW 13.3 01/17/2019 1513   RDW 14.1 11/06/2017 1717   LYMPHSABS 2.5 11/06/2017 1717   MONOABS 656 04/22/2016 1349   EOSABS 0.2 11/06/2017 1717   BASOSABS 0.0 11/06/2017 1717   Iron/TIBC/Ferritin/ %Sat No results found for: IRON, TIBC, FERRITIN, IRONPCTSAT Lipid Panel     Component Value Date/Time   CHOL 213 (H) 12/29/2018 1628   CHOL 146 06/24/2018 0956   TRIG 173.0 (H) 12/29/2018 1628   HDL 43.30 12/29/2018 1628   HDL 45 06/24/2018 0956   CHOLHDL 5 12/29/2018 1628   VLDL 34.6 12/29/2018 1628   LDLCALC 135 (H) 12/29/2018 1628   LDLCALC 78 06/24/2018 0956   Hepatic Function Panel     Component Value Date/Time   PROT 6.9 12/29/2018 1628   PROT 6.9 02/23/2018 0814   ALBUMIN 4.4 12/29/2018 1628   ALBUMIN 4.3 02/23/2018 0814   AST 45 (H) 12/29/2018 1628   ALT 85 (H) 12/29/2018 1628   ALKPHOS 53 12/29/2018 1628   BILITOT 0.4 12/29/2018 1628   BILITOT 0.4 02/23/2018 0814      Component Value Date/Time   TSH 2.230 09/18/2016 0956   TSH 2.44 04/22/2016 1349   TSH 1.731 03/09/2015 1524   Results for DENY, CHEVEZ (MRN [id number]) as of 02/16/2019 15:06  Ref. Range 06/24/2018  09:56  Vitamin D, 25-Hydroxy Latest Ref Range: 30.0 - 100.0 ng/mL 40.3    I, Michaelene Song, am acting as Location manager for Dennard Nip, MD I have reviewed the above documentation for accuracy and completeness, and I agree with the above. -Dennard Nip, MD

## 2019-03-10 ENCOUNTER — Other Ambulatory Visit: Payer: Self-pay

## 2019-03-10 ENCOUNTER — Encounter (INDEPENDENT_AMBULATORY_CARE_PROVIDER_SITE_OTHER): Payer: Self-pay | Admitting: Family Medicine

## 2019-03-10 ENCOUNTER — Ambulatory Visit (INDEPENDENT_AMBULATORY_CARE_PROVIDER_SITE_OTHER): Payer: BC Managed Care – PPO | Admitting: Family Medicine

## 2019-03-10 VITALS — BP 125/79 | HR 76 | Temp 97.9°F | Ht 68.0 in | Wt 267.0 lb

## 2019-03-10 DIAGNOSIS — I10 Essential (primary) hypertension: Secondary | ICD-10-CM

## 2019-03-10 DIAGNOSIS — R252 Cramp and spasm: Secondary | ICD-10-CM

## 2019-03-10 DIAGNOSIS — E559 Vitamin D deficiency, unspecified: Secondary | ICD-10-CM

## 2019-03-10 DIAGNOSIS — E119 Type 2 diabetes mellitus without complications: Secondary | ICD-10-CM

## 2019-03-10 DIAGNOSIS — Z6841 Body Mass Index (BMI) 40.0 and over, adult: Secondary | ICD-10-CM

## 2019-03-10 DIAGNOSIS — Z9189 Other specified personal risk factors, not elsewhere classified: Secondary | ICD-10-CM

## 2019-03-10 MED ORDER — LISINOPRIL 5 MG PO TABS: 5.0000 mg | ORAL_TABLET | Freq: Every day | ORAL | 0 refills | Status: DC

## 2019-03-10 MED ORDER — VITAMIN D (ERGOCALCIFEROL) 1.25 MG (50000 UNIT) PO CAPS: 50000.0000 [IU] | ORAL_CAPSULE | ORAL | 0 refills | Status: DC

## 2019-03-10 MED ORDER — CANAGLIFLOZIN 100 MG PO TABS: 100.0000 mg | ORAL_TABLET | Freq: Every day | ORAL | 0 refills | Status: DC

## 2019-03-10 NOTE — Progress Notes (Signed)
Office: [phone]  /  Fax: [phone]   HPI:   Chief Complaint: OBESITY Steven Hodge is here to discuss his progress with his obesity treatment plan. He is on the Category 3 plan and is following his eating plan approximately 20% of the time. He states he is walking/baled hay 30 minutes 1 time per week. Steven Hodge's last in-office visit was 7 months ago due to COVID-19. He has done well with weight loss during this time. He is not following his Category 3 plan but is mostly trying to PC/[place]. His weight is 267 lb (121.1 kg) today and has had a weight loss of 9 pounds over a period of 7 months since his last in-office visit. He has lost 5 lbs since starting treatment with [name].  Diabetes II Steven Hodge has a diagnosis of diabetes type II and is on metformin and Januvia. Steven Hodge states fasting blood sugars range between 146 and 207, mostly 170. He doesn't tolerate any GLD-1. He stopped Invokana as he thought it was making him feel sick, but now he thinks it was COVID and would like to try it again. Last A1c was 7.6 on 12/29/2018. He has been working on intensive lifestyle modifications including diet, exercise, and weight loss to help control his blood glucose levels.  Muscle Cramps Steven Hodge is on CoQ-10 and drinking Gatorade Zero. Cramps have resolved.  Hypertension Steven Hodge is a 58 y.o. male with hypertension.  Steven Hodge denies chest pain, headache, or dizziness. He is working weight loss to help control his blood pressure with the goal of decreasing his risk of heart attack and stroke. Steven Hodge's blood pressure is stable on medications.  At risk for cardiovascular disease Steven Hodge is at a higher than average risk for cardiovascular disease due to obesity. He currently denies any chest pain.  Vitamin D deficiency Steven Hodge has a diagnosis of Vitamin D deficiency. He is currently stable on prescription Vit D and denies nausea, vomiting or muscle weakness.  ASSESSMENT AND PLAN:  Type 2 diabetes mellitus  without complication, without long-term current use of insulin ([facility])  Muscle cramps  Essential hypertension - Plan: lisinopril (ZESTRIL) 5 MG tablet  Vitamin D deficiency - Plan: Vitamin D, Ergocalciferol, (DRISDOL) 1.25 MG (50000 UT) CAPS capsule  At risk for heart disease  Class 3 severe obesity with serious comorbidity and body mass index (BMI) of 40.0 to 44.9 in adult, unspecified obesity type ([place])  PLAN:  Diabetes II Steven Hodge has been given extensive diabetes education by myself today including ideal fasting and post-prandial blood glucose readings, individual ideal HgA1c goals  and hypoglycemia prevention. We discussed the importance of good blood sugar control to decrease the likelihood of diabetic complications such as nephropathy, neuropathy, limb loss, blindness, coronary artery disease, and death. We discussed the importance of intensive lifestyle modification including diet, exercise and weight loss as the first line treatment for diabetes. Steven Hodge will discontinue Januvia and restart Invokana 100 mg QD. He will follow-up with our clinic in 3 weeks.  Muscle Cramps Steven Hodge will continue as is and will monitor.  Hypertension We discussed sodium restriction, working on healthy weight loss, and a regular exercise program as the means to achieve improved blood pressure control. Steven Hodge agreed with this plan and agreed to follow up as directed. We will continue to monitor his blood pressure as well as his progress with the above lifestyle modifications. Steven Hodge was given a refill on his lisinopril 5 mg #30 with 0 refills and agrees to follow-up with our clinic  in 3 weeks. He will watch for signs of hypotension as he continues his lifestyle modifications.  Cardiovascular risk counseling Steven Hodge was given extended (30 minutes) coronary artery disease prevention counseling today. He is 58 y.o. male and has risk factors for heart disease including obesity. We discussed intensive lifestyle modifications  today with an emphasis on specific weight loss instructions and strategies. Pt was also informed of the importance of increasing exercise and decreasing saturated fats to help prevent heart disease.  Vitamin D Deficiency Steven Hodge was informed that low Vitamin D levels contributes to fatigue and are associated with obesity, breast, and colon cancer. He agrees to continue to take prescription Vit D @ 50,000 IU every week #4 with 0 refills and will follow-up for routine testing of Vitamin D, at least 2-3 times per year. He was informed of the risk of over-replacement of Vitamin D and agrees to not increase his dose unless he discusses this with [name] first. Steven Hodge agrees to follow-up with our clinic in 3 weeks.  Obesity Steven Hodge is currently in the action stage of change. As such, his goal is to continue with weight loss efforts. He has agreed to follow the Category 3 plan.  Steven Hodge has been instructed to work up to a goal of 150 minutes of combined cardio and strengthening exercise per week for weight loss and overall health benefits. We discussed the following Behavioral Modification Strategies today: increasing lean protein intake, work on meal planning and easy cooking plans.  Steven Hodge has agreed to follow-up with our clinic in 3 weeks. He was informed of the importance of frequent follow-up visits to maximize his success with intensive lifestyle modifications for his multiple health conditions.  ALLERGIES: No Known Allergies  MEDICATIONS: Current Outpatient Medications on File Prior to Visit  Medication Sig Dispense Refill  . allopurinol (ZYLOPRIM) 300 MG tablet Take 1 tablet (300 mg total) by mouth daily. 90 tablet 3  . aspirin 325 MG tablet Take 325 mg by mouth daily.    . blood glucose meter kit and supplies Dispense based on patient and insurance preference. Use up to four times daily as directed. (FOR ICD-9 250.00, 250.01). 1 each 0  . Cinnamon 500 MG TABS Take 1,000 mg by mouth daily.    . Coenzyme  Q10 (CO Q 10) 100 MG CAPS Take 1 capsule by mouth daily.    . fish oil-omega-3 fatty acids 1000 MG capsule Take 2 g by mouth daily.    [name] gabapentin (NEURONTIN) 300 MG capsule Take 1 capsule (300 mg total) by mouth 3 (three) times daily. 30 capsule 3  . glucosamine-chondroitin 500-400 MG tablet Take 1 tablet by mouth once.    [name] glucose blood (CONTOUR NEXT TEST) test strip USE TO CHECK BLOOD SUGAR UP TO 4 TIMES ADAY 100 each 0  . metFORMIN (GLUCOPHAGE) 500 MG tablet Take 1 tablet (500 mg total) by mouth 3 (three) times daily. 90 tablet 0  . MITIGARE 0.6 MG CAPS Take 2 capsule once, then 1 pill an hour later for gout 30 capsule 0  . Multiple Vitamin (MULTIVITAMIN) tablet Take 1 tablet by mouth daily.    . polyethylene glycol powder (GLYCOLAX/MIRALAX) powder Take 17 g by mouth daily. 3350 g 0  . TURMERIC PO Take 1,000 mg by mouth 2 (two) times daily.     No current facility-administered medications on file prior to visit.     PAST MEDICAL HISTORY: Past Medical History:  Diagnosis Date  . Back pain   . Cancer (  [place]) 06/09/2006   Basal cell carcinoma scalp;   [name] Diabetes mellitus without complication ([place])   . Gout   . Hx of blood clots   . Hypertension     PAST SURGICAL HISTORY: Past Surgical History:  Procedure Laterality Date  . basel cell cancer removed  2009  . KNEE SURGERY  2005   ACL repair R    SOCIAL HISTORY: Social History   Tobacco Use  . Smoking status: Former Research scientist (life sciences)  . Smokeless tobacco: Never Used  Substance Use Topics  . Alcohol use: No  . Drug use: No    FAMILY HISTORY: Family History  Problem Relation Age of Onset  . Diabetes Mother   . Liver disease Mother   . Obesity Mother   . Cancer Father 67       lung cancer  . Diabetes Father   . Cancer Sister 30       lung cancer  . Heart disease Brother 7       cardiac stenting/CAD  . Cancer Brother        skin cancer  . Arthritis Brother    ROS: Review of Systems  Cardiovascular: Negative for chest  pain.  Gastrointestinal: Negative for nausea and vomiting.  Musculoskeletal:       Negative for muscle weakness.  Neurological: Negative for dizziness and headaches.   PHYSICAL EXAM: Blood pressure 125/79, pulse 76, temperature 97.9 F (36.6 C), temperature source Oral, height '5\' 8"'  (1.727 m), weight 267 lb (121.1 kg), SpO2 99 %. Body mass index is 40.6 kg/m. Physical Exam Vitals signs reviewed.  Constitutional:      Appearance: Normal appearance. He is obese.  Cardiovascular:     Rate and Rhythm: Normal rate.     Pulses: Normal pulses.  Pulmonary:     Effort: Pulmonary effort is normal.     Breath sounds: Normal breath sounds.  Musculoskeletal: Normal range of motion.  Skin:    General: Skin is warm and dry.  Neurological:     Mental Status: He is alert and oriented to person, place, and time.  Psychiatric:        Behavior: Behavior normal.   RECENT LABS AND TESTS: BMET    Component Value Date/Time   NA 138 01/17/2019 1513   NA 141 02/23/2018 0814   K 4.3 01/17/2019 1513   CL 107 01/17/2019 1513   CO2 23 01/17/2019 1513   GLUCOSE 211 (H) 01/17/2019 1513   BUN 21 01/17/2019 1513   BUN 19 02/23/2018 0814   CREATININE 0.82 01/17/2019 1513   CREATININE 0.88 04/22/2016 1349   CALCIUM 9.2 01/17/2019 1513   GFRNONAA 97 02/23/2018 0814   GFRAA 113 02/23/2018 0814   Lab Results  Component Value Date   HGBA1C 7.6 (H) 12/29/2018   HGBA1C 6.7 (H) 06/24/2018   HGBA1C 6.3 (H) 02/23/2018   HGBA1C 6.1 (H) 10/12/2017   HGBA1C 6.4 (H) 06/03/2017   Lab Results  Component Value Date   INSULIN 26.7 (H) 06/24/2018   INSULIN 28.0 (H) 02/23/2018   INSULIN 31.8 (H) 10/12/2017   INSULIN 30.2 (H) 02/12/2017   INSULIN 32.4 (H) 09/18/2016   CBC    Component Value Date/Time   WBC 8.0 01/17/2019 1513   RBC 4.66 01/17/2019 1513   HGB 14.4 01/17/2019 1513   HGB 15.3 11/06/2017 1717   HCT 42.7 01/17/2019 1513   HCT 44.2 11/06/2017 1717   PLT 156.0 01/17/2019 1513   PLT 207  11/06/2017 1717   MCV  91.6 01/17/2019 1513   MCV 86 11/06/2017 1717   MCH 29.8 11/06/2017 1717   MCH 31.5 (A) 11/25/2016 1119   MCH 30.9 04/22/2016 1349   MCHC 33.8 01/17/2019 1513   RDW 13.3 01/17/2019 1513   RDW 14.1 11/06/2017 1717   LYMPHSABS 2.5 11/06/2017 1717   MONOABS 656 04/22/2016 1349   EOSABS 0.2 11/06/2017 1717   BASOSABS 0.0 11/06/2017 1717   Iron/TIBC/Ferritin/ %Sat No results found for: IRON, TIBC, FERRITIN, IRONPCTSAT Lipid Panel     Component Value Date/Time   CHOL 213 (H) 12/29/2018 1628   CHOL 146 06/24/2018 0956   TRIG 173.0 (H) 12/29/2018 1628   HDL 43.30 12/29/2018 1628   HDL 45 06/24/2018 0956   CHOLHDL 5 12/29/2018 1628   VLDL 34.6 12/29/2018 1628   LDLCALC 135 (H) 12/29/2018 1628   LDLCALC 78 06/24/2018 0956   Hepatic Function Panel     Component Value Date/Time   PROT 6.9 12/29/2018 1628   PROT 6.9 02/23/2018 0814   ALBUMIN 4.4 12/29/2018 1628   ALBUMIN 4.3 02/23/2018 0814   AST 45 (H) 12/29/2018 1628   ALT 85 (H) 12/29/2018 1628   ALKPHOS 53 12/29/2018 1628   BILITOT 0.4 12/29/2018 1628   BILITOT 0.4 02/23/2018 0814      Component Value Date/Time   TSH 2.230 09/18/2016 0956   TSH 2.44 04/22/2016 1349   TSH 1.731 03/09/2015 1524   Results for BERTIS, HUSTEAD (MRN [id number]) as of 03/10/2019 11:12  Ref. Range 06/24/2018 09:56  Vitamin D, 25-Hydroxy Latest Ref Range: 30.0 - 100.0 ng/mL 40.3   OBESITY BEHAVIORAL INTERVENTION VISIT  Today's visit was #37  Starting weight: 272 lbs Starting date: 09/18/2016 Today's weight: 267 lbs Today's date: 03/10/2019 Total lbs lost to date: 5    03/10/2019  Height '5\' 8"'  (1.727 m)  Weight 267 lb (121.1 kg)  BMI (Calculated) 40.61  BLOOD PRESSURE - SYSTOLIC 409  BLOOD PRESSURE - DIASTOLIC 79   Body Fat % 81.1 %  Total Body Water (lbs) 121.4 lbs   ASK: We discussed the diagnosis of obesity with Steven Hodge today and Steven Hodge agreed to give [name] permission to discuss obesity behavioral modification  therapy today.  ASSESS: Steven Hodge has the diagnosis of obesity and his BMI today is 40.7. Steven Hodge is in the action stage of change.  ADVISE: Steven Hodge was educated on the multiple health risks of obesity as well as the benefit of weight loss to improve his health. He was advised of the need for long term treatment and the importance of lifestyle modifications to improve his current health and to decrease his risk of future health problems.  AGREE: Multiple dietary modification options and treatment options were discussed and  Steven Hodge agreed to follow the recommendations documented in the above note.  ARRANGE: Steven Hodge was educated on the importance of frequent visits to treat obesity as outlined per CMS and USPSTF guidelines and agreed to schedule his next follow up appointment today.  I, Michaelene Song, am acting as Location manager for Dennard Nip, MD  I have reviewed the above documentation for accuracy and completeness, and I agree with the above. -Dennard Nip, MD

## 2019-03-24 ENCOUNTER — Other Ambulatory Visit (INDEPENDENT_AMBULATORY_CARE_PROVIDER_SITE_OTHER): Payer: Self-pay | Admitting: Family Medicine

## 2019-03-24 DIAGNOSIS — E118 Type 2 diabetes mellitus with unspecified complications: Secondary | ICD-10-CM

## 2019-03-24 DIAGNOSIS — E1165 Type 2 diabetes mellitus with hyperglycemia: Secondary | ICD-10-CM

## 2019-04-04 ENCOUNTER — Encounter (INDEPENDENT_AMBULATORY_CARE_PROVIDER_SITE_OTHER): Payer: Self-pay

## 2019-04-04 ENCOUNTER — Encounter (INDEPENDENT_AMBULATORY_CARE_PROVIDER_SITE_OTHER): Payer: Self-pay | Admitting: Family Medicine

## 2019-04-04 ENCOUNTER — Ambulatory Visit (INDEPENDENT_AMBULATORY_CARE_PROVIDER_SITE_OTHER): Payer: BC Managed Care – PPO | Admitting: Family Medicine

## 2019-04-04 ENCOUNTER — Other Ambulatory Visit: Payer: Self-pay

## 2019-04-04 VITALS — BP 125/76 | HR 89 | Temp 98.0°F | Ht 68.0 in | Wt 265.0 lb

## 2019-04-04 DIAGNOSIS — E559 Vitamin D deficiency, unspecified: Secondary | ICD-10-CM

## 2019-04-04 DIAGNOSIS — E1165 Type 2 diabetes mellitus with hyperglycemia: Secondary | ICD-10-CM | POA: Diagnosis not present

## 2019-04-04 DIAGNOSIS — I10 Essential (primary) hypertension: Secondary | ICD-10-CM | POA: Diagnosis not present

## 2019-04-04 DIAGNOSIS — Z6841 Body Mass Index (BMI) 40.0 and over, adult: Secondary | ICD-10-CM

## 2019-04-04 DIAGNOSIS — K429 Umbilical hernia without obstruction or gangrene: Secondary | ICD-10-CM

## 2019-04-04 DIAGNOSIS — Z9189 Other specified personal risk factors, not elsewhere classified: Secondary | ICD-10-CM | POA: Diagnosis not present

## 2019-04-04 MED ORDER — CANAGLIFLOZIN 100 MG PO TABS: 100.0000 mg | ORAL_TABLET | Freq: Every day | ORAL | 0 refills | Status: DC

## 2019-04-04 MED ORDER — VITAMIN D (ERGOCALCIFEROL) 1.25 MG (50000 UNIT) PO CAPS: 50000.0000 [IU] | ORAL_CAPSULE | ORAL | 0 refills | Status: DC

## 2019-04-04 MED ORDER — METFORMIN HCL 500 MG PO TABS: 500.0000 mg | ORAL_TABLET | Freq: Three times a day (TID) | ORAL | 0 refills | Status: DC

## 2019-04-04 MED ORDER — LISINOPRIL 5 MG PO TABS: 5.0000 mg | ORAL_TABLET | Freq: Every day | ORAL | 0 refills | Status: DC

## 2019-04-05 NOTE — Progress Notes (Signed)
Office: [phone]  /  Fax: [phone]   HPI:   Chief Complaint: OBESITY Steven Hodge is here to discuss his progress with his obesity treatment plan. He is on the Category 3 plan and is following his eating plan approximately 100 % of the time. He states he is walking 30 to 60 minutes 2 times per week. Steven Hodge states that he is doing better following his diet prescription. He has a family member who had a duodenal switch done and he wonders if weight loss Hodge is a good choice for him.  His weight is 265 lb (120.2 kg) today and has had a weight loss of 2 pounds over a period of 3 weeks since his last visit. He has lost 7 lbs since starting treatment with [name].  Hypertension Steven Hodge is a 58 y.o. male with hypertension. Steven Hodge's blood pressure is currently stable on medications. He has increased his water intake and feels better overall. He is working on weight loss to help control his blood pressure with the goal of decreasing his risk of heart attack and stroke. Steven Hodge denies chest pain, headache, or dizziness.  At risk for cardiovascular disease Steven Hodge is at a higher than average risk for cardiovascular disease due to hypertension and obesity. He currently denies any chest pain.  Umbilical Hernia Steven Hodge has had a fully reducible umbilical hernia for 2+ years and wonders if he needs to have this repaired.  Vitamin D Deficiency Steven Hodge has a diagnosis of vitamin D deficiency. He is currently stable on vit D. Steven Hodge denies nausea, vomiting, or muscle weakness.  Diabetes II Steven Hodge has a diagnosis of diabetes type II. Steven Hodge's blood sugar is no yet under control. He did not tolerate any GLP-1's or Januvia due to abdominal pain. His fasting blood sugar is mostly 150 to 220 and his last A1c was 7.6 on 12/29/18. Steven Hodge denies any hypoglycemic episodes. He has been working on intensive lifestyle modifications including diet, exercise, and weight loss to help control his blood glucose levels.  ASSESSMENT  AND PLAN:  Essential hypertension - Plan: lisinopril (ZESTRIL) 5 MG tablet  Umbilical hernia without obstruction and without gangrene  Vitamin D deficiency - Plan: Vitamin D, Ergocalciferol, (DRISDOL) 1.25 MG (50000 UT) CAPS capsule  Type 2 diabetes mellitus with hyperglycemia, without long-term current use of insulin (HCC) - Plan: canagliflozin (INVOKANA) 100 MG TABS tablet, metFORMIN (GLUCOPHAGE) 500 MG tablet  At risk for heart disease  Class 3 severe obesity with serious comorbidity and body mass index (BMI) of 40.0 to 44.9 in adult, unspecified obesity type (Steven Hodge)  PLAN:  Hypertension We discussed sodium restriction, working on healthy weight loss, and a regular exercise program as the means to achieve improved blood pressure control. We will continue to monitor his blood pressure as well as his progress with the above lifestyle modifications. He will continue his medications as prescribed and will watch for signs of hypotension as he continues his lifestyle modifications. Steven Hodge agrees to continue lisinopril 5 mg qd #30 with no refills. Steven Hodge agreed with this plan and agreed to follow up as directed.  Cardiovascular risk counseling Steven Hodge was given extended (15 minutes) coronary artery disease prevention counseling today. He is 58 y.o. male and has risk factors for heart disease including hypertension and obesity. We discussed intensive lifestyle modifications today with an emphasis on specific weight loss instructions and strategies. Pt was also informed of the importance of increasing exercise and decreasing saturated fats to help prevent heart disease.  Umbilical Hernia Steven Hodge  will be referred to Steven Hodge.  Vitamin D Deficiency Steven Hodge was informed that low vitamin D levels contribute to fatigue and are associated with obesity, breast, and colon cancer. Saben agrees to continue to take prescription Vit D '@50' ,000 IU every week #4 with no refills and will follow up for  routine testing of vitamin D, at least 2-3 times per year. He was informed of the risk of over-replacement of vitamin D and agrees to not increase his dose unless he discusses this with [name] first. Steven Hodge agrees to follow up in 3 to 4 weeks as directed.  Diabetes II Steven Hodge has been given extensive diabetes education by myself today including ideal fasting and post-prandial blood glucose readings, individual ideal Hgb A1c goals, and hypoglycemia prevention. We discussed the importance of good blood sugar control to decrease the likelihood of diabetic complications such as nephropathy, neuropathy, limb loss, blindness, coronary artery disease, and death. We discussed the importance of intensive lifestyle modification including diet, exercise, and weight loss as the first line treatment for diabetes. Steven Hodge agrees to continue his Invokana 100 mg qd before breakfast #30 with no refills and metformin 500 mg TID #90 with no refills. Steven Hodge will consider weight loss Hodge to help control blood sugar and he will follow up at the agreed upon time.   Obesity Steven Hodge is currently in the action stage of change. As such, his goal is to continue with weight loss efforts. He has agreed to follow the Category 3 plan. Steven Hodge has been instructed to work up to a goal of 150 minutes of combined cardio and strengthening exercise per week for weight loss and overall health benefits. We discussed the following Behavioral Modification Strategies today: increasing lean protein intake, better snacking choices, and work on meal planning and easy cooking plans. We discussed various Hodge options to help Steven Hodge with his weight loss efforts and we both agreed to refer him to Steven Hodge to go to the informational session.  Steven Hodge has agreed to follow up with our clinic in 3 to 4 weeks. He was informed of the importance of frequent follow up visits to maximize his success with intensive lifestyle modifications for his multiple  health conditions.  ALLERGIES: No Known Allergies  MEDICATIONS: Current Outpatient Medications on File Prior to Visit  Medication Sig Dispense Refill   allopurinol (ZYLOPRIM) 300 MG tablet Take 1 tablet (300 mg total) by mouth daily. 90 tablet 3   aspirin 325 MG tablet Take 325 mg by mouth daily.     blood glucose meter kit and supplies Dispense based on patient and insurance preference. Use up to four times daily as directed. (FOR ICD-9 250.00, 250.01). 1 each 0   Cinnamon 500 MG TABS Take 1,000 mg by mouth daily.     Coenzyme Q10 (CO Q 10) 100 MG CAPS Take 1 capsule by mouth daily.     fish oil-omega-3 fatty acids 1000 MG capsule Take 2 g by mouth daily.     gabapentin (NEURONTIN) 300 MG capsule Take 1 capsule (300 mg total) by mouth 3 (three) times daily. 30 capsule 3   glucosamine-chondroitin 500-400 MG tablet Take 1 tablet by mouth once.     glucose blood (CONTOUR NEXT TEST) test strip USE TO CHECK BLOOD SUGAR UP TO 4 TIMES ADAY 100 each 0   MITIGARE 0.6 MG CAPS Take 2 capsule once, then 1 pill an hour later for gout 30 capsule 0   Multiple Vitamin (MULTIVITAMIN) tablet Take 1 tablet by  mouth daily.     polyethylene glycol powder (GLYCOLAX/MIRALAX) powder Take 17 g by mouth daily. 3350 g 0   TURMERIC PO Take 1,000 mg by mouth 2 (two) times daily.     No current facility-administered medications on file prior to visit.     PAST MEDICAL HISTORY: Past Medical History:  Diagnosis Date   Back pain    Cancer ([place]) 06/09/2006   Basal cell carcinoma scalp;    Diabetes mellitus without complication (HCC)    Gout    Hx of blood clots    Hypertension     PAST SURGICAL HISTORY: Past Surgical History:  Procedure Laterality Date   basel cell cancer removed  2009   KNEE Hodge  2005   ACL repair R    SOCIAL HISTORY: Social History   Tobacco Use   Smoking status: Former Smoker   Smokeless tobacco: Never Used  Substance Use Topics   Alcohol use: No    Drug use: No    FAMILY HISTORY: Family History  Problem Relation Age of Onset   Diabetes Mother    Liver disease Mother    Obesity Mother    Cancer Father 71       lung cancer   Diabetes Father    Cancer Sister 63       lung cancer   Heart disease Brother 1       cardiac stenting/CAD   Cancer Brother        skin cancer   Arthritis Brother     ROS: Review of Systems  Constitutional: Positive for weight loss.  Cardiovascular: Negative for chest pain.  Gastrointestinal: Negative for nausea and vomiting.  Musculoskeletal:       Negative for muscle weakness.  Neurological: Negative for dizziness and headaches.  Endo/Heme/Allergies:       Negative for hypoglycemia.    PHYSICAL EXAM: Blood pressure 125/76, pulse 89, temperature 98 F (36.7 C), temperature source Oral, height '5\' 8"'  (1.727 m), weight 265 lb (120.2 kg), SpO2 98 %. Body mass index is 40.29 kg/m. Physical Exam Vitals signs reviewed.  Constitutional:      Appearance: Normal appearance. He is obese.  Cardiovascular:     Rate and Rhythm: Normal rate.  Pulmonary:     Effort: Pulmonary effort is normal.  Abdominal:     Hernia: Hernia:  Easily reproducible umbilical hernia palpated.  Musculoskeletal: Normal range of motion.  Skin:    General: Skin is warm and dry.  Neurological:     Mental Status: He is alert and oriented to person, place, and time.  Psychiatric:        Mood and Affect: Mood normal.        Behavior: Behavior normal.     RECENT LABS AND TESTS: BMET    Component Value Date/Time   NA 138 01/17/2019 1513   NA 141 02/23/2018 0814   K 4.3 01/17/2019 1513   CL 107 01/17/2019 1513   CO2 23 01/17/2019 1513   GLUCOSE 211 (H) 01/17/2019 1513   BUN 21 01/17/2019 1513   BUN 19 02/23/2018 0814   CREATININE 0.82 01/17/2019 1513   CREATININE 0.88 04/22/2016 1349   CALCIUM 9.2 01/17/2019 1513   GFRNONAA 97 02/23/2018 0814   GFRAA 113 02/23/2018 0814   Lab Results  Component Value  Date   HGBA1C 7.6 (H) 12/29/2018   HGBA1C 6.7 (H) 06/24/2018   HGBA1C 6.3 (H) 02/23/2018   HGBA1C 6.1 (H) 10/12/2017   HGBA1C 6.4 (H) 06/03/2017  Lab Results  Component Value Date   INSULIN 26.7 (H) 06/24/2018   INSULIN 28.0 (H) 02/23/2018   INSULIN 31.8 (H) 10/12/2017   INSULIN 30.2 (H) 02/12/2017   INSULIN 32.4 (H) 09/18/2016   CBC    Component Value Date/Time   WBC 8.0 01/17/2019 1513   RBC 4.66 01/17/2019 1513   HGB 14.4 01/17/2019 1513   HGB 15.3 11/06/2017 1717   HCT 42.7 01/17/2019 1513   HCT 44.2 11/06/2017 1717   PLT 156.0 01/17/2019 1513   PLT 207 11/06/2017 1717   MCV 91.6 01/17/2019 1513   MCV 86 11/06/2017 1717   MCH 29.8 11/06/2017 1717   MCH 31.5 (A) 11/25/2016 1119   MCH 30.9 04/22/2016 1349   MCHC 33.8 01/17/2019 1513   RDW 13.3 01/17/2019 1513   RDW 14.1 11/06/2017 1717   LYMPHSABS 2.5 11/06/2017 1717   MONOABS 656 04/22/2016 1349   EOSABS 0.2 11/06/2017 1717   BASOSABS 0.0 11/06/2017 1717   Iron/TIBC/Ferritin/ %Sat No results found for: IRON, TIBC, FERRITIN, IRONPCTSAT Lipid Panel     Component Value Date/Time   CHOL 213 (H) 12/29/2018 1628   CHOL 146 06/24/2018 0956   TRIG 173.0 (H) 12/29/2018 1628   HDL 43.30 12/29/2018 1628   HDL 45 06/24/2018 0956   CHOLHDL 5 12/29/2018 1628   VLDL 34.6 12/29/2018 1628   LDLCALC 135 (H) 12/29/2018 1628   LDLCALC 78 06/24/2018 0956   Hepatic Function Panel     Component Value Date/Time   PROT 6.9 12/29/2018 1628   PROT 6.9 02/23/2018 0814   ALBUMIN 4.4 12/29/2018 1628   ALBUMIN 4.3 02/23/2018 0814   AST 45 (H) 12/29/2018 1628   ALT 85 (H) 12/29/2018 1628   ALKPHOS 53 12/29/2018 1628   BILITOT 0.4 12/29/2018 1628   BILITOT 0.4 02/23/2018 0814      Component Value Date/Time   TSH 2.230 09/18/2016 0956   TSH 2.44 04/22/2016 1349   TSH 1.731 03/09/2015 1524   Results for Steven Hodge, Steven Hodge (MRN [id number]) as of 04/05/2019 08:18  Ref. Range 12/29/2018 16:28  VITD Latest Ref Range: 30.00 - 100.00  ng/mL 32.52     OBESITY BEHAVIORAL INTERVENTION VISIT  Today's visit was # 38  Starting weight: 272 lbs Starting date: 09/18/2016 Today's weight : Weight: 265 lb (120.2 kg)  Today's date: 04/04/2019 Total lbs lost to date: 7    04/04/2019  Height '5\' 8"'  (1.727 m)  Weight 265 lb (120.2 kg)  BMI (Calculated) 40.3  BLOOD PRESSURE - SYSTOLIC 176  BLOOD PRESSURE - DIASTOLIC 76   Body Fat % 16.0 %  Total Body Water (lbs) 120.4 lbs    ASK: We discussed the diagnosis of obesity with Steven Hodge today and Steven Hodge agreed to give [name] permission to discuss obesity behavioral modification therapy today.  ASSESS: Steven Hodge has the diagnosis of obesity and his BMI today is 40.3. Steven Hodge is in the action stage of change.   ADVISE: Steven Hodge was educated on the multiple health risks of obesity as well as the benefit of weight loss to improve his health. He was advised of the need for long term treatment and the importance of lifestyle modifications to improve his current health and to decrease his risk of future health problems.  AGREE: Multiple dietary modification options and treatment options were discussed and Steven Hodge agreed to follow the recommendations documented in the above note.  ARRANGE: Steven Hodge was educated on the importance of frequent visits to treat obesity as outlined per CMS  and USPSTF guidelines and agreed to schedule his next follow up appointment today.  IMarcille Blanco, CMA, am acting as transcriptionist for Starlyn Skeans, MD I have reviewed the above documentation for accuracy and completeness, and I agree with the above. -Dennard Nip, MD

## 2019-04-11 ENCOUNTER — Encounter (INDEPENDENT_AMBULATORY_CARE_PROVIDER_SITE_OTHER): Payer: Self-pay | Admitting: Family Medicine

## 2019-04-11 NOTE — Telephone Encounter (Signed)
Please advise 

## 2019-05-02 ENCOUNTER — Ambulatory Visit (INDEPENDENT_AMBULATORY_CARE_PROVIDER_SITE_OTHER): Payer: BC Managed Care – PPO | Admitting: Family Medicine

## 2019-05-02 ENCOUNTER — Encounter (INDEPENDENT_AMBULATORY_CARE_PROVIDER_SITE_OTHER): Payer: Self-pay | Admitting: Family Medicine

## 2019-05-02 ENCOUNTER — Other Ambulatory Visit: Payer: Self-pay

## 2019-05-02 VITALS — BP 117/70 | HR 69 | Temp 97.9°F | Ht 68.0 in | Wt 258.0 lb

## 2019-05-02 DIAGNOSIS — Z9189 Other specified personal risk factors, not elsewhere classified: Secondary | ICD-10-CM | POA: Diagnosis not present

## 2019-05-02 DIAGNOSIS — E7849 Other hyperlipidemia: Secondary | ICD-10-CM | POA: Diagnosis not present

## 2019-05-02 DIAGNOSIS — E1165 Type 2 diabetes mellitus with hyperglycemia: Secondary | ICD-10-CM

## 2019-05-02 DIAGNOSIS — E559 Vitamin D deficiency, unspecified: Secondary | ICD-10-CM

## 2019-05-02 DIAGNOSIS — Z6839 Body mass index (BMI) 39.0-39.9, adult: Secondary | ICD-10-CM

## 2019-05-02 MED ORDER — CANAGLIFLOZIN 100 MG PO TABS: 100.0000 mg | ORAL_TABLET | Freq: Every day | ORAL | 0 refills | Status: DC

## 2019-05-02 MED ORDER — VITAMIN D (ERGOCALCIFEROL) 1.25 MG (50000 UNIT) PO CAPS: 50000.0000 [IU] | ORAL_CAPSULE | ORAL | 0 refills | Status: DC

## 2019-05-02 NOTE — Progress Notes (Signed)
Office: [phone]  /  Fax: [phone]   HPI:   Chief Complaint: OBESITY Steven Hodge is here to discuss his progress with his obesity treatment plan. He is on the Category 3 plan and is following his eating plan approximately 100 % of the time. He states he is walking 30 minutes 1 to 2 times per week. Steven Hodge recently attended a seminar at [facility] and is considering bariatric surgery but is quite confused about what he should do. He feels that he does not eat much and still cannot lose weight so he is unsure if the surgery will help him. He has been attending our clinic since April 2018 and is down 14 lbs.  Steven Hodge feels that he sticks to the plan well, but has been unsuccessful with much weight loss. He plans on making an appointment with Dr. Johnathan Hodge to discuss the surgery.  He has cut out all bread, fruit, and extra calories over the past few weeks and has lost 7 lbs. He has essentially been eating low carb.   His weight is 258 lb (117 kg) today and has had a weight loss of 7 pounds over a period of 4 weeks since his last visit. He has lost 14 lbs since starting treatment with [name].  Diabetes II with Hyperglycemia Steven Hodge has a diagnosis of diabetes type II. Steven Hodge states that his BGs range between 150 and 160's. His last A1c was 7.6 on 12/29/18. Steven Hodge is tolerating  Invokana well. He is on metformin as well. He has been working on intensive lifestyle modifications including diet, exercise, and weight loss to help control his blood glucose levels.  Vitamin D Deficiency Steven Hodge has a diagnosis of vitamin D deficiency. He is currently on vit D, but is not yet at goal. His last vitamin D level was 28.6 on 10/18/18. Steven Hodge denies nausea, vomiting, or muscle weakness.  At risk for osteopenia and osteoporosis Steven Hodge is at higher risk of osteopenia and osteoporosis due to vitamin D deficiency.   Hyperlipidemia Steven Hodge has hyperlipidemia and is not on a statin.His last LDL was 135 and  triglycerides were 173 on 12/29/18. Steven Hodge is not at goal. He has been trying to improve his cholesterol levels with intensive lifestyle modification including a low saturated fat diet, exercise, and weight loss. He denies any chest pain and shortness of breath.   ASSESSMENT AND PLAN:  Type 2 diabetes mellitus with hyperglycemia, without long-term current use of insulin (HCC) - Plan: canagliflozin (INVOKANA) 100 MG TABS tablet, Comprehensive metabolic panel, Hemoglobin A1c, Insulin, random  Vitamin D deficiency - Plan: Vitamin D, Ergocalciferol, (DRISDOL) 1.25 MG (50000 UT) CAPS capsule, Vitamin D (25 hydroxy)  Other hyperlipidemia - Plan: Lipid Panel With LDL/HDL Ratio  At risk for osteoporosis  Class 2 severe obesity with serious comorbidity and body mass index (BMI) of 39.0 to 39.9 in adult, unspecified obesity type ([place])  PLAN:  Diabetes II with Hyperglycemia Steven Hodge has been given extensive diabetes education by myself today including ideal fasting and post-prandial blood glucose readings, individual ideal Hgb A1c goals, and hypoglycemia prevention. We discussed the importance of good blood sugar control to decrease the likelihood of diabetic complications such as nephropathy, neuropathy, limb loss, blindness, coronary artery disease, and death. We discussed the importance of intensive lifestyle modification including diet, exercise, and weight loss as the first line treatment for diabetes. An A1c, fasting insulin, and fasting glucose will be ordered today. Steven Hodge agrees to continue his Invokana 100 mg qd #30 with no  refills and will follow up at the agreed upon time in 2 weeks.  Vitamin D Deficiency Steven Hodge was informed that low vitamin D levels contribute to fatigue and are associated with obesity, breast, and colon cancer. Steven Hodge agrees to continue to take prescription Vit D _0 ,000 IU every week #4 with no refills and will follow up for routine testing of vitamin D, at least 2-3 times per year.  He was informed of the risk of over-replacement of vitamin D and agrees to not increase his dose unless he discusses this with [name] first. We will order a vitamin D level. Steven Hodge agrees to follow up in 2 weeks as directed.  At risk for osteopenia and osteoporosis Steven Hodge was given extended (15 minutes) osteoporosis prevention counseling today. Steven Hodge is at risk for osteopenia and osteoporosis due to his vitamin D deficiency. He was encouraged to take his vitamin D and follow his higher calcium diet and increase strengthening exercise to help strengthen his bones and decrease his risk of osteopenia and osteoporosis.  Hyperlipidemia Steven Hodge was informed of the American Heart Association Guidelines emphasizing intensive lifestyle modifications as the first line treatment for hyperlipidemia. We discussed many lifestyle modifications today in depth, and Steven Hodge will continue to work on decreasing saturated fats such as fatty red meat, butter, and many fried foods. He will also increase vegetables and lean protein in his diet and continue to work on exercise and weight loss efforts. A FLP will be ordered today and Steven Hodge will follow up in 2 weeks. He is not on a statin. I will discuss adding statin at next visit.   Obesity Steven Hodge is currently in the action stage of change. As such, his goal is to continue with weight loss efforts. He has agreed to follow a lower carbohydrate, vegetable, and lean protein rich diet plan or follow the Category 3 plan. Steven Hodge has been instructed to continue walking 1 to 2 times per week. We discussed the following Behavioral Modification Strategies today: increasing lean protein intake and planning for success. I discussed bariatric surgery options with him and did advise him that I feel that surgery (especially Roux-en-Y in consideration of his diabetes) would be beneficial for him. I also advised him that sustained changes in diet and exercise must occur for long-term weight loss success  after bariatric surgery.  Steven Hodge has agreed to follow up with our clinic in 2 weeks. He was informed of the importance of frequent follow up visits to maximize his success with intensive lifestyle modifications for his multiple health conditions.  ALLERGIES: No Known Allergies  MEDICATIONS: Current Outpatient Medications on File Prior to Visit  Medication Sig Dispense Refill  . allopurinol (ZYLOPRIM) 300 MG tablet Take 1 tablet (300 mg total) by mouth daily. 90 tablet 3  . aspirin 325 MG tablet Take 325 mg by mouth daily.    . blood glucose meter kit and supplies Dispense based on patient and insurance preference. Use up to four times daily as directed. (FOR ICD-9 250.00, 250.01). 1 each 0  . Cinnamon 500 MG TABS Take 1,000 mg by mouth daily.    . Coenzyme Q10 (CO Q 10) 100 MG CAPS Take 1 capsule by mouth daily.    . fish oil-omega-3 fatty acids 1000 MG capsule Take 2 g by mouth daily.    [name] gabapentin (NEURONTIN) 300 MG capsule Take 1 capsule (300 mg total) by mouth 3 (three) times daily. 30 capsule 3  . glucosamine-chondroitin 500-400 MG tablet Take 1 tablet by mouth once.    [name]  glucose blood (CONTOUR NEXT TEST) test strip USE TO CHECK BLOOD SUGAR UP TO 4 TIMES ADAY 100 each 0  . lisinopril (ZESTRIL) 5 MG tablet Take 1 tablet (5 mg total) by mouth daily. 30 tablet 0  . metFORMIN (GLUCOPHAGE) 500 MG tablet Take 1 tablet (500 mg total) by mouth 3 (three) times daily. 90 tablet 0  . MITIGARE 0.6 MG CAPS Take 2 capsule once, then 1 pill an hour later for gout 30 capsule 0  . Multiple Vitamin (MULTIVITAMIN) tablet Take 1 tablet by mouth daily.    . polyethylene glycol powder (GLYCOLAX/MIRALAX) powder Take 17 g by mouth daily. 3350 g 0  . TURMERIC PO Take 1,000 mg by mouth 2 (two) times daily.     No current facility-administered medications on file prior to visit.     PAST MEDICAL HISTORY: Past Medical History:  Diagnosis Date  . Back pain   . Cancer ([place]) 06/09/2006   Basal cell carcinoma  scalp;   [name] Diabetes mellitus without complication ([place])   . Gout   . Hx of blood clots   . Hypertension     PAST SURGICAL HISTORY: Past Surgical History:  Procedure Laterality Date  . basel cell cancer removed  2009  . KNEE SURGERY  2005   ACL repair R    SOCIAL HISTORY: Social History   Tobacco Use  . Smoking status: Former Research scientist (life sciences)  . Smokeless tobacco: Never Used  Substance Use Topics  . Alcohol use: No  . Drug use: No    FAMILY HISTORY: Family History  Problem Relation Age of Onset  . Diabetes Mother   . Liver disease Mother   . Obesity Mother   . Cancer Father 65       lung cancer  . Diabetes Father   . Cancer Sister 41       lung cancer  . Heart disease Brother 58       cardiac stenting/CAD  . Cancer Brother        skin cancer  . Arthritis Brother     ROS: Review of Systems  Constitutional: Positive for weight loss.  Respiratory: Negative for shortness of breath.   Cardiovascular: Negative for chest pain.  Gastrointestinal: Negative for nausea and vomiting.  Musculoskeletal:       Negative for muscle weakness.  Endo/Heme/Allergies:       Positive for hyperglycemia.    PHYSICAL EXAM: Blood pressure 117/70, pulse 69, temperature 97.9 F (36.6 C), temperature source Oral, height 5' 8" (1.727 m), weight 258 lb (117 kg), SpO2 99 %. Body mass index is 39.23 kg/m. Physical Exam Vitals signs reviewed.  Constitutional:      Appearance: Normal appearance. He is obese.  Cardiovascular:     Rate and Rhythm: Normal rate.  Pulmonary:     Effort: Pulmonary effort is normal.  Musculoskeletal: Normal range of motion.  Skin:    General: Skin is warm and dry.  Neurological:     Mental Status: He is alert and oriented to person, place, and time.  Psychiatric:        Mood and Affect: Mood normal.        Behavior: Behavior normal.     RECENT LABS AND TESTS: BMET    Component Value Date/Time   NA 138 01/17/2019 1513   NA 141 02/23/2018 0814   K 4.3  01/17/2019 1513   CL 107 01/17/2019 1513   CO2 23 01/17/2019 1513   GLUCOSE 211 (H) 01/17/2019 1513  BUN 21 01/17/2019 1513   BUN 19 02/23/2018 0814   CREATININE 0.82 01/17/2019 1513   CREATININE 0.88 04/22/2016 1349   CALCIUM 9.2 01/17/2019 1513   GFRNONAA 97 02/23/2018 0814   GFRAA 113 02/23/2018 0814   Lab Results  Component Value Date   HGBA1C 7.6 (H) 12/29/2018   HGBA1C 6.7 (H) 06/24/2018   HGBA1C 6.3 (H) 02/23/2018   HGBA1C 6.1 (H) 10/12/2017   HGBA1C 6.4 (H) 06/03/2017   Lab Results  Component Value Date   INSULIN 26.7 (H) 06/24/2018   INSULIN 28.0 (H) 02/23/2018   INSULIN 31.8 (H) 10/12/2017   INSULIN 30.2 (H) 02/12/2017   INSULIN 32.4 (H) 09/18/2016   CBC    Component Value Date/Time   WBC 8.0 01/17/2019 1513   RBC 4.66 01/17/2019 1513   HGB 14.4 01/17/2019 1513   HGB 15.3 11/06/2017 1717   HCT 42.7 01/17/2019 1513   HCT 44.2 11/06/2017 1717   PLT 156.0 01/17/2019 1513   PLT 207 11/06/2017 1717   MCV 91.6 01/17/2019 1513   MCV 86 11/06/2017 1717   MCH 29.8 11/06/2017 1717   MCH 31.5 (A) 11/25/2016 1119   MCH 30.9 04/22/2016 1349   MCHC 33.8 01/17/2019 1513   RDW 13.3 01/17/2019 1513   RDW 14.1 11/06/2017 1717   LYMPHSABS 2.5 11/06/2017 1717   MONOABS 656 04/22/2016 1349   EOSABS 0.2 11/06/2017 1717   BASOSABS 0.0 11/06/2017 1717   Iron/TIBC/Ferritin/ %Sat No results found for: IRON, TIBC, FERRITIN, IRONPCTSAT Lipid Panel     Component Value Date/Time   CHOL 213 (H) 12/29/2018 1628   CHOL 146 06/24/2018 0956   TRIG 173.0 (H) 12/29/2018 1628   HDL 43.30 12/29/2018 1628   HDL 45 06/24/2018 0956   CHOLHDL 5 12/29/2018 1628   VLDL 34.6 12/29/2018 1628   LDLCALC 135 (H) 12/29/2018 1628   LDLCALC 78 06/24/2018 0956   Hepatic Function Panel     Component Value Date/Time   PROT 6.9 12/29/2018 1628   PROT 6.9 02/23/2018 0814   ALBUMIN 4.4 12/29/2018 1628   ALBUMIN 4.3 02/23/2018 0814   AST 45 (H) 12/29/2018 1628   ALT 85 (H) 12/29/2018 1628    ALKPHOS 53 12/29/2018 1628   BILITOT 0.4 12/29/2018 1628   BILITOT 0.4 02/23/2018 0814      Component Value Date/Time   TSH 2.230 09/18/2016 0956   TSH 2.44 04/22/2016 1349   TSH 1.731 03/09/2015 1524   Results for JAYKUB, MACKINS (MRN [id number]) as of 05/02/2019 12:32  Ref. Range 06/24/2018 09:56  Vitamin D, 25-Hydroxy Latest Ref Range: 30.0 - 100.0 ng/mL 40.3   OBESITY BEHAVIORAL INTERVENTION VISIT  Today's visit was # 39  Starting weight: 272 lbs Starting date: 09/18/2016 Today's weight : Weight: 258 lb (117 kg)  Today's date: 05/02/2019 Total lbs lost to date: 14    05/02/2019  Height 5' 8" (1.727 m)  Weight 258 lb (117 kg)  BMI (Calculated) 39.24  BLOOD PRESSURE - SYSTOLIC 322  BLOOD PRESSURE - DIASTOLIC 70   Body Fat % 02.5 %  Total Body Water (lbs) 112.8 lbs    ASK: We discussed the diagnosis of obesity with Steven Hodge today and Steven Hodge agreed to give [name] permission to discuss obesity behavioral modification therapy today.  ASSESS: Steven Hodge has the diagnosis of obesity and his BMI today is 39.24. Steven Hodge is in the action stage of change.   ADVISE: Steven Hodge was educated on the multiple health risks of obesity as well as  the benefit of weight loss to improve his health. He was advised of the need for long term treatment and the importance of lifestyle modifications to improve his current health and to decrease his risk of future health problems.  AGREE: Multiple dietary modification options and treatment options were discussed and Steven Hodge agreed to follow the recommendations documented in the above note.  ARRANGE: Steven Hodge was educated on the importance of frequent visits to treat obesity as outlined per CMS and USPSTF guidelines and agreed to schedule his next follow up appointment today.  Steven Hodge, CMA, am acting as Location manager for [employer], FNP-C.  I have reviewed the above documentation for accuracy and completeness, and I agree with the above.  -  Steven Clugston, FNP-C.

## 2019-05-03 ENCOUNTER — Other Ambulatory Visit (INDEPENDENT_AMBULATORY_CARE_PROVIDER_SITE_OTHER): Payer: Self-pay | Admitting: Family Medicine

## 2019-05-03 DIAGNOSIS — E118 Type 2 diabetes mellitus with unspecified complications: Secondary | ICD-10-CM

## 2019-05-03 LAB — LIPID PANEL WITH LDL/HDL RATIO
Cholesterol, Total: 207 mg/dL — ABNORMAL HIGH (ref 100–199)
HDL: 44 mg/dL (ref >39)
LDL Chol Calc (NIH): 142 mg/dL — ABNORMAL HIGH (ref 0–99)
LDL/HDL Ratio: 3.2 ratio (ref 0.0–3.6)
Triglycerides: 116 mg/dL (ref 0–149)
VLDL Cholesterol Cal: 21 mg/dL (ref 5–40)

## 2019-05-03 LAB — COMPREHENSIVE METABOLIC PANEL
ALT: 77 IU/L — ABNORMAL HIGH (ref 0–44)
AST: 42 IU/L — ABNORMAL HIGH (ref 0–40)
Albumin/Globulin Ratio: 1.7 (ref 1.2–2.2)
Albumin: 4.5 g/dL (ref 3.8–4.9)
Alkaline Phosphatase: 75 IU/L (ref 39–117)
BUN/Creatinine Ratio: 21 — ABNORMAL HIGH (ref 9–20)
BUN: 21 mg/dL (ref 6–24)
Bilirubin Total: 0.3 mg/dL (ref 0.0–1.2)
CO2: 17 mmol/L — ABNORMAL LOW (ref 20–29)
Calcium: 9.4 mg/dL (ref 8.7–10.2)
Chloride: 103 mmol/L (ref 96–106)
Creatinine, Ser: 0.98 mg/dL (ref 0.76–1.27)
GFR calc Af Amer: 99 mL/min/{1.73_m2} (ref >59)
GFR calc non Af Amer: 86 mL/min/{1.73_m2} (ref >59)
Globulin, Total: 2.6 g/dL (ref 1.5–4.5)
Glucose: 148 mg/dL — ABNORMAL HIGH (ref 65–99)
Potassium: 5.1 mmol/L (ref 3.5–5.2)
Sodium: 142 mmol/L (ref 134–144)
Total Protein: 7.1 g/dL (ref 6.0–8.5)

## 2019-05-03 LAB — HEMOGLOBIN A1C
Est. average glucose Bld gHb Est-mCnc: 171 mg/dL
Hgb A1c MFr Bld: 7.6 % — ABNORMAL HIGH (ref 4.8–5.6)

## 2019-05-03 LAB — INSULIN, RANDOM: INSULIN: 10.4 u[IU]/mL (ref 2.6–24.9)

## 2019-05-03 LAB — VITAMIN D 25 HYDROXY (VIT D DEFICIENCY, FRACTURES): Vit D, 25-Hydroxy: 34.6 ng/mL (ref 30.0–100.0)

## 2019-05-07 ENCOUNTER — Encounter (INDEPENDENT_AMBULATORY_CARE_PROVIDER_SITE_OTHER): Payer: Self-pay | Admitting: Family Medicine

## 2019-05-07 DIAGNOSIS — E7849 Other hyperlipidemia: Secondary | ICD-10-CM | POA: Insufficient documentation

## 2019-05-07 HISTORY — DX: Other hyperlipidemia: E78.49

## 2019-05-25 ENCOUNTER — Encounter (INDEPENDENT_AMBULATORY_CARE_PROVIDER_SITE_OTHER): Payer: Self-pay | Admitting: Family Medicine

## 2019-05-25 ENCOUNTER — Telehealth (INDEPENDENT_AMBULATORY_CARE_PROVIDER_SITE_OTHER): Payer: BC Managed Care – PPO | Admitting: Family Medicine

## 2019-05-25 ENCOUNTER — Other Ambulatory Visit: Payer: Self-pay

## 2019-05-25 DIAGNOSIS — E1165 Type 2 diabetes mellitus with hyperglycemia: Secondary | ICD-10-CM | POA: Diagnosis not present

## 2019-05-25 DIAGNOSIS — Z6839 Body mass index (BMI) 39.0-39.9, adult: Secondary | ICD-10-CM | POA: Diagnosis not present

## 2019-05-25 DIAGNOSIS — E559 Vitamin D deficiency, unspecified: Secondary | ICD-10-CM

## 2019-05-25 MED ORDER — METFORMIN HCL 500 MG PO TABS: 500.0000 mg | ORAL_TABLET | Freq: Three times a day (TID) | ORAL | 0 refills | Status: DC

## 2019-05-25 MED ORDER — VITAMIN D (ERGOCALCIFEROL) 1.25 MG (50000 UNIT) PO CAPS: 50000.0000 [IU] | ORAL_CAPSULE | ORAL | 0 refills | Status: DC

## 2019-05-25 MED ORDER — CANAGLIFLOZIN 100 MG PO TABS: 100.0000 mg | ORAL_TABLET | Freq: Every day | ORAL | 0 refills | Status: DC

## 2019-05-30 ENCOUNTER — Other Ambulatory Visit (INDEPENDENT_AMBULATORY_CARE_PROVIDER_SITE_OTHER): Payer: Self-pay | Admitting: Family Medicine

## 2019-05-30 DIAGNOSIS — I10 Essential (primary) hypertension: Secondary | ICD-10-CM

## 2019-05-30 NOTE — Progress Notes (Signed)
Office: [phone]  /  Fax: [phone] TeleHealth Visit:  Steven Hodge has verbally consented to this TeleHealth visit today. The patient is located at work, the provider is located at the [employer] [facility] office. The participants in this visit include the listed provider and patient and any and all parties involved. The visit was conducted today via telephone. Steven Hodge was unable to use realtime audiovisual technology today and the telehealth visit was conducted via telephone (time spent on call approximately 21 minutes).  HPI:  Chief Complaint: OBESITY Steven Hodge is here to discuss his progress with his obesity treatment plan. He is on the lower carbohydrate, vegetable and lean protein rich diet plan and states he is following his eating plan approximately 100 % of the time. He states he is walking 30 minutes 2 to 3 times per week. (weight 258 lbs at home was 05/24/19) which is the same as his last in offive weight on 05/02/19. Steven Hodge is basically eating low carb with some nuts but not following our low carb plan exactly. He has occasional carbs such as rice. He is still thinking about bariatric surgery and he would like to discuss this further with Dr. Leafy Ro. He has attended the bariatric surgery informational seminar. His main concern is that he feels that his inability to lose weight is unrelated to the amount that he eats because he feels he eats small to normal portions. From his understanding the weight loss surgery mainly works by reducing food volume which he does not feel is a problem for him. He states that if he does have surgery he will choose Roux-en-Y gastric bypass. .  Diabetes II Steven Hodge has a diagnosis of diabetes type II and he is not well controlled. His  A1c was at 7.6 on both 05/02/19 and 12/29/18.  Steven Hodge states fasting BGs range between 125 and 190. He denies hypoglycemia.   Vitamin D deficiency Steven Hodge has a diagnosis of vitamin D deficiency. His last vitamin D level was  at 34.6 on 05/02/19 and was not at goal.     ASSESSMENT AND PLAN:  Type 2 diabetes mellitus with hyperglycemia, without long-term current use of insulin (HCC) - Plan: canagliflozin (INVOKANA) 100 MG TABS tablet, metFORMIN (GLUCOPHAGE) 500 MG tablet  Vitamin D deficiency - Plan: Vitamin D, Ergocalciferol, (DRISDOL) 1.25 MG (50000 UT) CAPS capsule  Class 2 severe obesity with serious comorbidity and body mass index (BMI) of 39.0 to 39.9 in adult, unspecified obesity type ([place])  PLAN:  Diabetes II Steven Hodge has been given diabetes education by myself today. Good blood sugar control is important to decrease the likelihood of diabetic complications such as nephropathy, neuropathy, limb loss, blindness, coronary artery disease, and death. Intensive lifestyle modification including diet, exercise and weight loss were discussed as the first line treatment for diabetes. Steven Hodge agrees to continue Invokana 100 mg daily #30 with no refills and Metformin 500 mg three times daily #90 with no refills and follow up as directed.  Vitamin D Deficiency Low vitamin D level contributes to fatigue and are associated with obesity, breast, and colon cancer. Steven Hodge agrees to continue to take prescription Vit D '@50' ,000 IU every week #4 with no refills and he will follow up for routine testing of vitamin D, at least 2-3 times per year to avoid over-replacement. Steven Hodge agrees to follow up as directed.  Obesity Steven Hodge is currently in the action stage of change. As such, his goal is to continue with weight loss efforts He has agreed to  follow a lower carbohydrate, vegetable and lean protein rich diet plan Steven Hodge will continue walking for 30 minutes, 2 to 3 times per week for weight loss and overall health benefits. We discussed the following Behavioral Modification Strategies today: planning for success and decreasing simple carbohydrates . I did advise him that I felt weight loss surgery could be beneficial for him but only if  accompanied by appropriate food choices and exercise. I also advised him that bariatric surgeries (especially Roux-en-y) do cause metabolic changes which would be beneficial for both weight loss and improved control of his diabetes.  He will discuss this further with Dr. Leafy Ro at his next visit.   Steven Hodge has agreed to follow up with our clinic in 3 to 4 weeks. He was informed of the importance of frequent follow up visits to maximize his success with intensive lifestyle modifications for his multiple health conditions.  ALLERGIES: No Known Allergies  MEDICATIONS: Current Outpatient Medications on File Prior to Visit  Medication Sig Dispense Refill  . allopurinol (ZYLOPRIM) 300 MG tablet Take 1 tablet (300 mg total) by mouth daily. 90 tablet 3  . aspirin 325 MG tablet Take 325 mg by mouth daily.    . blood glucose meter kit and supplies Dispense based on patient and insurance preference. Use up to four times daily as directed. (FOR ICD-9 250.00, 250.01). 1 each 0  . Cinnamon 500 MG TABS Take 1,000 mg by mouth daily.    . Coenzyme Q10 (CO Q 10) 100 MG CAPS Take 1 capsule by mouth daily.    . CONTOUR NEXT TEST test strip USE TO CHECK BLOOD SUGAR UP TO 4 TIMES ADAY 100 strip 0  . fish oil-omega-3 fatty acids 1000 MG capsule Take 2 g by mouth daily.    [name] gabapentin (NEURONTIN) 300 MG capsule Take 1 capsule (300 mg total) by mouth 3 (three) times daily. 30 capsule 3  . glucosamine-chondroitin 500-400 MG tablet Take 1 tablet by mouth once.    [name] lisinopril (ZESTRIL) 5 MG tablet Take 1 tablet (5 mg total) by mouth daily. 30 tablet 0  . MITIGARE 0.6 MG CAPS Take 2 capsule once, then 1 pill an hour later for gout 30 capsule 0  . Multiple Vitamin (MULTIVITAMIN) tablet Take 1 tablet by mouth daily.    . polyethylene glycol powder (GLYCOLAX/MIRALAX) powder Take 17 g by mouth daily. 3350 g 0  . TURMERIC PO Take 1,000 mg by mouth 2 (two) times daily.     No current facility-administered medications on  file prior to visit.    PAST MEDICAL HISTORY: Past Medical History:  Diagnosis Date  . Back pain   . Cancer ([place]) 06/09/2006   Basal cell carcinoma scalp;   [name] Diabetes mellitus without complication ([place])   . Gout   . Hx of blood clots   . Hypertension     PAST SURGICAL HISTORY: Past Surgical History:  Procedure Laterality Date  . basel cell cancer removed  2009  . KNEE SURGERY  2005   ACL repair R    SOCIAL HISTORY: Social History   Tobacco Use  . Smoking status: Former Research scientist (life sciences)  . Smokeless tobacco: Never Used  Substance Use Topics  . Alcohol use: No  . Drug use: No    FAMILY HISTORY: Family History  Problem Relation Age of Onset  . Diabetes Mother   . Liver disease Mother   . Obesity Mother   . Cancer Father 20       lung  cancer  . Diabetes Father   . Cancer Sister 47       lung cancer  . Heart disease Brother 2       cardiac stenting/CAD  . Cancer Brother        skin cancer  . Arthritis Brother     ROS: Review of Systems  Constitutional: Positive for weight loss.  Endo/Heme/Allergies:       Negative for hypoglycemia    PHYSICAL EXAM: There were no vitals taken for this visit. There is no height or weight on file to calculate BMI. Physical Exam Vitals reviewed.  Constitutional:      General: He is not in acute distress.    Appearance: Normal appearance. He is well-developed. He is obese.  Cardiovascular:     Rate and Rhythm: Normal rate.  Pulmonary:     Effort: Pulmonary effort is normal.  Musculoskeletal:        General: Normal range of motion.  Skin:    General: Skin is warm and dry.  Neurological:     Mental Status: He is alert and oriented to person, place, and time.  Psychiatric:        Mood and Affect: Mood normal.        Behavior: Behavior normal.     RECENT LABS AND TESTS: BMET    Component Value Date/Time   NA 142 05/02/2019 1627   K 5.1 05/02/2019 1627   CL 103 05/02/2019 1627   CO2 17 (L) 05/02/2019 1627   GLUCOSE  148 (H) 05/02/2019 1627   GLUCOSE 211 (H) 01/17/2019 1513   BUN 21 05/02/2019 1627   CREATININE 0.98 05/02/2019 1627   CREATININE 0.88 04/22/2016 1349   CALCIUM 9.4 05/02/2019 1627   GFRNONAA 86 05/02/2019 1627   GFRAA 99 05/02/2019 1627   Lab Results  Component Value Date   HGBA1C 7.6 (H) 05/02/2019   HGBA1C 7.6 (H) 12/29/2018   HGBA1C 6.7 (H) 06/24/2018   HGBA1C 6.3 (H) 02/23/2018   HGBA1C 6.1 (H) 10/12/2017   Lab Results  Component Value Date   INSULIN 10.4 05/02/2019   INSULIN 26.7 (H) 06/24/2018   INSULIN 28.0 (H) 02/23/2018   INSULIN 31.8 (H) 10/12/2017   INSULIN 30.2 (H) 02/12/2017   CBC    Component Value Date/Time   WBC 8.0 01/17/2019 1513   RBC 4.66 01/17/2019 1513   HGB 14.4 01/17/2019 1513   HGB 15.3 11/06/2017 1717   HCT 42.7 01/17/2019 1513   HCT 44.2 11/06/2017 1717   PLT 156.0 01/17/2019 1513   PLT 207 11/06/2017 1717   MCV 91.6 01/17/2019 1513   MCV 86 11/06/2017 1717   MCH 29.8 11/06/2017 1717   MCH 31.5 (A) 11/25/2016 1119   MCH 30.9 04/22/2016 1349   MCHC 33.8 01/17/2019 1513   RDW 13.3 01/17/2019 1513   RDW 14.1 11/06/2017 1717   LYMPHSABS 2.5 11/06/2017 1717   MONOABS 656 04/22/2016 1349   EOSABS 0.2 11/06/2017 1717   BASOSABS 0.0 11/06/2017 1717   Iron/TIBC/Ferritin/ %Sat No results found for: IRON, TIBC, FERRITIN, IRONPCTSAT Lipid Panel     Component Value Date/Time   CHOL 207 (H) 05/02/2019 1627   TRIG 116 05/02/2019 1627   HDL 44 05/02/2019 1627   CHOLHDL 5 12/29/2018 1628   VLDL 34.6 12/29/2018 1628   LDLCALC 142 (H) 05/02/2019 1627   Hepatic Function Panel     Component Value Date/Time   PROT 7.1 05/02/2019 1627   ALBUMIN 4.5 05/02/2019 1627   AST  42 (H) 05/02/2019 1627   ALT 77 (H) 05/02/2019 1627   ALKPHOS 75 05/02/2019 1627   BILITOT 0.3 05/02/2019 1627      Component Value Date/Time   TSH 2.230 09/18/2016 0956   TSH 2.44 04/22/2016 1349   TSH 1.731 03/09/2015 1524     Ref. Range 05/02/2019 16:27  Vitamin D,  25-Hydroxy Latest Ref Range: 30.0 - 100.0 ng/mL 34.6    I, Doreene Nest, am acting as Location manager for [employer], FNP-C.  I have reviewed the above documentation for accuracy and completeness, and I agree with the above.  - Kamarian Sahakian, FNP-C.

## 2019-06-21 ENCOUNTER — Other Ambulatory Visit: Payer: Self-pay

## 2019-06-21 ENCOUNTER — Ambulatory Visit (INDEPENDENT_AMBULATORY_CARE_PROVIDER_SITE_OTHER): Payer: BC Managed Care – PPO | Admitting: Family Medicine

## 2019-06-21 ENCOUNTER — Encounter (INDEPENDENT_AMBULATORY_CARE_PROVIDER_SITE_OTHER): Payer: Self-pay | Admitting: Family Medicine

## 2019-06-21 VITALS — BP 134/69 | HR 82 | Temp 98.3°F | Ht 68.0 in | Wt 256.0 lb

## 2019-06-21 DIAGNOSIS — I10 Essential (primary) hypertension: Secondary | ICD-10-CM

## 2019-06-21 DIAGNOSIS — Z9189 Other specified personal risk factors, not elsewhere classified: Secondary | ICD-10-CM

## 2019-06-21 DIAGNOSIS — E559 Vitamin D deficiency, unspecified: Secondary | ICD-10-CM

## 2019-06-21 DIAGNOSIS — E1165 Type 2 diabetes mellitus with hyperglycemia: Secondary | ICD-10-CM

## 2019-06-21 DIAGNOSIS — Z6839 Body mass index (BMI) 39.0-39.9, adult: Secondary | ICD-10-CM

## 2019-06-21 MED ORDER — CANAGLIFLOZIN 100 MG PO TABS: 100.0000 mg | ORAL_TABLET | Freq: Every day | ORAL | 0 refills | Status: DC

## 2019-06-21 MED ORDER — LISINOPRIL 5 MG PO TABS: 5.0000 mg | ORAL_TABLET | Freq: Every day | ORAL | 0 refills | Status: DC

## 2019-06-21 MED ORDER — VITAMIN D (ERGOCALCIFEROL) 1.25 MG (50000 UNIT) PO CAPS: 50000.0000 [IU] | ORAL_CAPSULE | ORAL | 0 refills | Status: DC

## 2019-06-21 MED ORDER — METFORMIN HCL 500 MG PO TABS: 500.0000 mg | ORAL_TABLET | Freq: Three times a day (TID) | ORAL | 0 refills | Status: DC

## 2019-06-23 NOTE — Progress Notes (Signed)
Chief Complaint:   OBESITY Steven Hodge is here to discuss his progress with his obesity treatment plan along with follow-up of his obesity related diagnoses. Steven Hodge is following a lower carbohydrate, vegetable and lean protein rich diet plan and states he is following his eating plan approximately 100% of the time. Steven Hodge states he is walking for 30 minutes 2 times per week.  Today's visit was #: 61  Starting weight: 272 lbs Starting date: 09/18/16 Today's weight: 256 lbs Today's date: 06/21/2019 Total lbs lost to date: 16 Total lbs lost since last in-office visit: 2  Interim History: Steven Hodge states he is following his Category 3 plan exactly even over the holidays. He lost 2 lbs, which is excellent but overall he has not met his weight loss goals. He went to [facility] for their information session, and he has many questions about weight loss surgery.  Subjective:   1. Vitamin D deficiency Steven Hodge's Vit D level is stable on prescription Vit D. He requests a refill today.  2. Essential hypertension Steven Hodge's blood pressure is stable on his medications. He denies chest pain or headaches.  3. Type 2 diabetes mellitus with hyperglycemia, without long-term current use of insulin (HCC) Steven Hodge did not bring in his blood sugar log. He is considering weight loss surgery to help control his diabetes mellitus. He requests a refill of his medications.  4. At risk for malnutrition from weight loss surgery Steven Hodge is at increased risk for malnutrition due to his history of bariatric surgery.   Assessment/Plan:   1. Vitamin D deficiency Low Vitamin D level contributes to fatigue and are associated with obesity, breast, and colon cancer. We will refill prescription Vit D for 1 month. He will follow-up for routine testing of vitamin D, at least 2-3 times per year to avoid over-replacement. We will continue to monitor.  - Vitamin D, Ergocalciferol, (DRISDOL) 1.25 MG (50000 UNIT) CAPS  capsule; Take 1 capsule (50,000 Units total) by mouth every 7 (seven) days.  Dispense: 4 capsule; Refill: 0  2. Essential hypertension Steven Hodge is working on healthy weight loss and exercise to improve blood pressure control. We will watch for signs of hypotension as he continues his lifestyle modifications. We will refill lisinopril for 1 month. We will continue to monitor.  - lisinopril (ZESTRIL) 5 MG tablet; Take 1 tablet (5 mg total) by mouth daily.  Dispense: 30 tablet; Refill: 0  3. Type 2 diabetes mellitus with hyperglycemia, without long-term current use of insulin (HCC) Steven Hodge has been given diabetes education by myself today. Good blood sugar control is important to decrease the likelihood of diabetic complications such as nephropathy, neuropathy, limb loss, blindness, coronary artery disease, and death. Intensive lifestyle modification including diet, exercise and weight loss were discussed as the first line treatment for diabetes. We will refill metformin and Invokana for 1 month. We will continue to monitor.  - metFORMIN (GLUCOPHAGE) 500 MG tablet; Take 1 tablet (500 mg total) by mouth 3 (three) times daily.  Dispense: 90 tablet; Refill: 0 - canagliflozin (INVOKANA) 100 MG TABS tablet; Take 1 tablet (100 mg total) by mouth daily before breakfast.  Dispense: 30 tablet; Refill: 0  4. At risk for malnutrition from weight loss surgery Steven Hodge was given approximately 30 minutes of counseling today regarding prevention of malnutrition. Steven Hodge was advised that having bariatric surgery increases his risk for anemia, malnutrition, and vitamin deficiencies.   5. Class 2 severe obesity with serious comorbidity and body mass index (  BMI) of 39.0 to 39.9 in adult, unspecified obesity type [facility]) Steven Hodge is currently in the action stage of change. As such, his goal is to continue with weight loss efforts. He has agreed to on the Category 3 Plan.   We discussed the following exercise goals today: Steven Hodge is to  add strengthening exercises to his routine.  We discussed the following behavioral modification strategies today: increasing lean protein intake.  Steven Hodge was educated on further types of weight loss surgeries. Restriction versus malabsorption, and all of the patient's questions were answered. He was advised to make his appointment for a surgical consultation.  Steven Hodge has agreed to follow-up with our clinic in 4 weeks. He was informed of the importance of frequent follow-up visits to maximize his success with intensive lifestyle modifications for his multiple health conditions.   Objective:   Blood pressure 134/69, pulse 82, temperature 98.3 F (36.8 C), temperature source Oral, height '5\' 8"'  (1.727 m), weight 256 lb (116.1 kg), SpO2 96 %. Body mass index is 38.92 kg/m.  General: Cooperative, alert, well developed, in no acute distress. HEENT: Conjunctivae and lids unremarkable. Neck: No thyromegaly.  Cardiovascular: Regular rhythm.  Lungs: Normal work of breathing. Extremities: No edema.  Neurologic: No focal deficits.   Lab Results  Component Value Date   CREATININE 0.98 05/02/2019   BUN 21 05/02/2019   NA 142 05/02/2019   K 5.1 05/02/2019   CL 103 05/02/2019   CO2 17 (L) 05/02/2019   Lab Results  Component Value Date   ALT 77 (H) 05/02/2019   AST 42 (H) 05/02/2019   ALKPHOS 75 05/02/2019   BILITOT 0.3 05/02/2019   Lab Results  Component Value Date   HGBA1C 7.6 (H) 05/02/2019   HGBA1C 7.6 (H) 12/29/2018   HGBA1C 6.7 (H) 06/24/2018   HGBA1C 6.3 (H) 02/23/2018   HGBA1C 6.1 (H) 10/12/2017   Lab Results  Component Value Date   INSULIN 10.4 05/02/2019   INSULIN 26.7 (H) 06/24/2018   INSULIN 28.0 (H) 02/23/2018   INSULIN 31.8 (H) 10/12/2017   INSULIN 30.2 (H) 02/12/2017   Lab Results  Component Value Date   TSH 2.230 09/18/2016   Lab Results  Component Value Date   CHOL 207 (H) 05/02/2019   HDL 44 05/02/2019   LDLCALC 142 (H) 05/02/2019   TRIG 116  05/02/2019   CHOLHDL 5 12/29/2018   Lab Results  Component Value Date   WBC 8.0 01/17/2019   HGB 14.4 01/17/2019   HCT 42.7 01/17/2019   MCV 91.6 01/17/2019   PLT 156.0 01/17/2019   No results found for: IRON, TIBC, FERRITIN  Attestation Statements:   Reviewed by clinician on day of visit: allergies, medications, problem list, medical history, surgical history, family history, social history, and previous encounter notes.   I, Trixie Dredge, am acting as transcriptionist for Dennard Nip, MD.  I have reviewed the above documentation for accuracy and completeness, and I agree with the above. -  Dennard Nip, MD

## 2019-06-27 ENCOUNTER — Encounter (INDEPENDENT_AMBULATORY_CARE_PROVIDER_SITE_OTHER): Payer: Self-pay | Admitting: Family Medicine

## 2019-06-28 NOTE — Telephone Encounter (Signed)
Please advise 

## 2019-07-06 DIAGNOSIS — I10 Essential (primary) hypertension: Secondary | ICD-10-CM | POA: Diagnosis not present

## 2019-07-06 DIAGNOSIS — E1169 Type 2 diabetes mellitus with other specified complication: Secondary | ICD-10-CM | POA: Diagnosis not present

## 2019-07-06 DIAGNOSIS — E7800 Pure hypercholesterolemia, unspecified: Secondary | ICD-10-CM | POA: Diagnosis not present

## 2019-07-13 ENCOUNTER — Other Ambulatory Visit (HOSPITAL_COMMUNITY): Payer: Self-pay | Admitting: General Surgery

## 2019-07-13 ENCOUNTER — Other Ambulatory Visit: Payer: Self-pay | Admitting: General Surgery

## 2019-07-19 NOTE — Progress Notes (Signed)
[facility] [place], [place], [place] [address] [phone]  Date:  07/21/2019   Name:  Steven Hodge   DOB:  March 27, 1961   MRN:  [id number]  PCP:  Darreld Mclean, MD    Chief Complaint: Annual Exam and Flu Vaccine   History of Present Illness:  Steven Hodge is a 59 y.o. very pleasant male patient who presents with the following:  Here today for routine physical History of diabetes, hyperlipidemia, hypertension, obesity, gout, vitamin D deficiency  Last seen by myself in August of last year with a wrist issue He is also seen Dr. Leafy Ro at the [facility], they are helping to manage his diabetes and hypertension  Lab Results  Component Value Date   HGBA1C 7.6 (H) 05/02/2019   BP Readings from Last 3 Encounters:  07/21/19 122/82  06/21/19 134/69  05/02/19 117/70   Flu vaccine- give today Tetanus vaccine- give today  Foot exam is due Lipids done in November   He is planning to have gastric bypass per [facility] and needs to have a letter of support from me He hopes to have his operation in April He is struggling with weight his whole life, he is physically active and really tries to watch his diet.  However he has been unable to reduce In his 3s he was dx with fatty liver and used slim fast Lost weight and it came back In 2002 he tried a nutritionist and did lose weight- came back  He is now attending a formal weight loss program but not losing as much weight    Patient Active Problem List   Diagnosis Date Noted  . Other hyperlipidemia 05/07/2019  . Type 2 diabetes mellitus with hyperglycemia, without long-term current use of insulin ([place]) 07/06/2017  . 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  . Vitamin D deficiency 11/18/2016  . Class 2 severe obesity with serious comorbidity and body mass index (BMI) of 39.0 to 39.9 in adult ([place]) 11/04/2016  . Essential  hypertension 09/18/2016  . Morbid obesity ([place]) 08/20/2016  . History of colonic polyps 08/20/2016    Past Medical History:  Diagnosis Date  . Back pain   . Cancer ([place]) 06/09/2006   Basal cell carcinoma scalp;   [name] Diabetes mellitus without complication ([place])   . Gout   . Hx of blood clots   . Hypertension     Past Surgical History:  Procedure Laterality Date  . basel cell cancer removed  2009  . KNEE SURGERY  2005   ACL repair R    Social History   Tobacco Use  . Smoking status: Former Research scientist (life sciences)  . Smokeless tobacco: Never Used  Substance Use Topics  . Alcohol use: No  . Drug use: No    Family History  Problem Relation Age of Onset  . Diabetes Mother   . Liver disease Mother   . Obesity Mother   . Cancer Father 62       lung cancer  . Diabetes Father   . Cancer Sister 71       lung cancer  . Heart disease Brother 33       cardiac stenting/CAD  . Cancer Brother        skin cancer  . Arthritis Brother     No Known Allergies  Medication list has been reviewed and updated.  Current Outpatient Medications on File  Prior to Visit  Medication Sig Dispense Refill  . allopurinol (ZYLOPRIM) 300 MG tablet Take 1 tablet (300 mg total) by mouth daily. 90 tablet 3  . aspirin 325 MG tablet Take 325 mg by mouth daily.    . blood glucose meter kit and supplies Dispense based on patient and insurance preference. Use up to four times daily as directed. (FOR ICD-9 250.00, 250.01). 1 each 0  . canagliflozin (INVOKANA) 100 MG TABS tablet Take 1 tablet (100 mg total) by mouth daily before breakfast. 30 tablet 0  . Coenzyme Q10 (CO Q 10) 100 MG CAPS Take 1 capsule by mouth daily.    . CONTOUR NEXT TEST test strip USE TO CHECK BLOOD SUGAR UP TO 4 TIMES ADAY 100 strip 0  . fish oil-omega-3 fatty acids 1000 MG capsule Take 2 g by mouth daily.    [name] gabapentin (NEURONTIN) 300 MG capsule Take 1 capsule (300 mg total) by mouth 3 (three) times daily. 30 capsule 3  . glucosamine-chondroitin  500-400 MG tablet Take 1 tablet by mouth once.    [name] lisinopril (ZESTRIL) 5 MG tablet Take 1 tablet (5 mg total) by mouth daily. 30 tablet 0  . metFORMIN (GLUCOPHAGE) 500 MG tablet Take 1 tablet (500 mg total) by mouth 3 (three) times daily. 90 tablet 0  . MITIGARE 0.6 MG CAPS Take 2 capsule once, then 1 pill an hour later for gout 30 capsule 0  . Multiple Vitamin (MULTIVITAMIN) tablet Take 1 tablet by mouth daily.    . polyethylene glycol powder (GLYCOLAX/MIRALAX) powder Take 17 g by mouth daily. 3350 g 0  . TURMERIC PO Take 1,000 mg by mouth 2 (two) times daily.    . Vitamin D, Ergocalciferol, (DRISDOL) 1.25 MG (50000 UNIT) CAPS capsule Take 1 capsule (50,000 Units total) by mouth every 7 (seven) days. 4 capsule 0   No current facility-administered medications on file prior to visit.    Review of Systems:  As per HPI- otherwise negative.   Physical Examination: Vitals:   07/21/19 0819  BP: 122/82  Pulse: 79  Resp: 16  Temp: (!) 96.2 F (35.7 C)  SpO2: 93%   Vitals:   07/21/19 0819  Weight: 265 lb (120.2 kg)  Height: 5' 8" (1.727 m)   Body mass index is 40.29 kg/m. Ideal Body Weight: Weight in (lb) to have BMI = 25: 164.1  GEN: no acute distress.  Obese, looks well  HEENT: Atraumatic, Normocephalic.  Ears and Nose: No external deformity. CV: RRR, No M/G/R. No JVD. No thrill. No extra heart sounds. PULM: CTA B, no wheezes, crackles, rhonchi. No retractions. No resp. distress. No accessory muscle use. ABD: S, NT, ND, +BS. No rebound. No HSM. EXTR: No c/c/e PSYCH: Normally interactive. Conversant.  Foot exam normal today   Assessment and Plan: Physical exam  Vitamin D deficiency  Essential hypertension  Type 2 diabetes mellitus with hyperglycemia, without long-term current use of insulin ([place])  Other hyperlipidemia  Class 2 severe obesity with serious comorbidity and body mass index (BMI) of 39.0 to 39.9 in adult, unspecified obesity type (HCC)  Idiopathic  chronic gout of multiple sites with tophus - Plan: allopurinol (ZYLOPRIM) 300 MG tablet  Immunization due - Plan: Td vaccine greater than or equal to 7yo preservative free IM, Flu Vaccine QUAD 36+ mos IM  Fatty liver  Here today for a follow-up visit.  Labs are up-to-date Gave tetanus and flu vaccines Patient is planning to have gastric bypass surgery this spring,  anticipate this will be very helpful for him.  I wrote a letter of support and fax to [facility] surgery for him today Refilled his allopurinol.  He will request a uric acid level at next routine lab draw Blood pressure under good control Moderate medical decision making today This visit occurred during the SARS-CoV-2 public health emergency.  Safety protocols were in place, including screening questions prior to the visit, additional usage of staff PPE, and extensive cleaning of exam room while observing appropriate contact time as indicated for disinfecting solutions.    Signed Lamar Blinks, MD

## 2019-07-19 NOTE — Patient Instructions (Signed)
Good to see you today!   Consider getting the shingles vaccine and covid vaccine at your convenience  Your got your tetanus and flu boosters today At next lab draw, please request a uric acid level for me if you are able  Best of luck with your upcoming weight loss surgery    Health Maintenance, Male Adopting a healthy lifestyle and getting preventive care are important in promoting health and wellness. Ask your health care provider about:  The right schedule for you to have regular tests and exams.  Things you can do on your own to prevent diseases and keep yourself healthy. What should I know about diet, weight, and exercise? Eat a healthy diet   Eat a diet that includes plenty of vegetables, fruits, low-fat dairy products, and lean protein.  Do not eat a lot of foods that are high in solid fats, added sugars, or sodium. Maintain a healthy weight Body mass index (BMI) is a measurement that can be used to identify possible weight problems. It estimates body fat based on height and weight. Your health care provider can help determine your BMI and help you achieve or maintain a healthy weight. Get regular exercise Get regular exercise. This is one of the most important things you can do for your health. Most adults should:  Exercise for at least 150 minutes each week. The exercise should increase your heart rate and make you sweat (moderate-intensity exercise).  Do strengthening exercises at least twice a week. This is in addition to the moderate-intensity exercise.  Spend less time sitting. Even light physical activity can be beneficial. Watch cholesterol and blood lipids Have your blood tested for lipids and cholesterol at 57 years of age, then have this test every 5 years. You may need to have your cholesterol levels checked more often if:  Your lipid or cholesterol levels are high.  You are older than 57 years of age.  You are at high risk for heart disease. What should I  know about cancer screening? Many types of cancers can be detected early and may often be prevented. Depending on your health history and family history, you may need to have cancer screening at various ages. This may include screening for:  Colorectal cancer.  Prostate cancer.  Skin cancer.  Lung cancer. What should I know about heart disease, diabetes, and high blood pressure? Blood pressure and heart disease  High blood pressure causes heart disease and increases the risk of stroke. This is more likely to develop in people who have high blood pressure readings, are of African descent, or are overweight.  Talk with your health care provider about your target blood pressure readings.  Have your blood pressure checked: ? Every 3-5 years if you are 70-38 years of age. ? Every year if you are 10 years old or older.  If you are between the ages of 47 and 11 and are a current or former smoker, ask your health care provider if you should have a one-time screening for abdominal aortic aneurysm (AAA). Diabetes Have regular diabetes screenings. This checks your fasting blood sugar level. Have the screening done:  Once every three years after age 21 if you are at a normal weight and have a low risk for diabetes.  More often and at a younger age if you are overweight or have a high risk for diabetes. What should I know about preventing infection? Hepatitis B If you have a higher risk for hepatitis B, you should be  screened for this virus. Talk with your health care provider to find out if you are at risk for hepatitis B infection. Hepatitis C Blood testing is recommended for:  Everyone born from [birth year] through 1963.  Anyone with known risk factors for hepatitis C. Sexually transmitted infections (STIs)  You should be screened each year for STIs, including gonorrhea and chlamydia, if: ? You are sexually active and are younger than 57 years of age. ? You are older than 57 years of age and  your health care provider tells you that you are at risk for this type of infection. ? Your sexual activity has changed since you were last screened, and you are at increased risk for chlamydia or gonorrhea. Ask your health care provider if you are at risk.  Ask your health care provider about whether you are at high risk for HIV. Your health care provider may recommend a prescription medicine to help prevent HIV infection. If you choose to take medicine to prevent HIV, you should first get tested for HIV. You should then be tested every 3 months for as long as you are taking the medicine. Follow these instructions at home: Lifestyle  Do not use any products that contain nicotine or tobacco, such as cigarettes, e-cigarettes, and chewing tobacco. If you need help quitting, ask your health care provider.  Do not use street drugs.  Do not share needles.  Ask your health care provider for help if you need support or information about quitting drugs. Alcohol use  Do not drink alcohol if your health care provider tells you not to drink.  If you drink alcohol: ? Limit how much you have to 0-2 drinks a day. ? Be aware of how much alcohol is in your drink. In the [place] one drink equals one 12 oz bottle of beer (355 mL), one 5 oz glass of wine (148 mL), or one 1 oz glass of hard liquor (44 mL). General instructions  Schedule regular health, dental, and eye exams.  Stay current with your vaccines.  Tell your health care provider if: ? You often feel depressed. ? You have ever been abused or do not feel safe at home. Summary  Adopting a healthy lifestyle and getting preventive care are important in promoting health and wellness.  Follow your health care provider's instructions about healthy diet, exercising, and getting tested or screened for diseases.  Follow your health care provider's instructions on monitoring your cholesterol and blood pressure. This information is not intended to  replace advice given to you by your health care provider. Make sure you discuss any questions you have with your health care provider. Document Revised: 05/19/2018 Document Reviewed: 05/19/2018 Elsevier Patient Education  2020 [employer].

## 2019-07-20 ENCOUNTER — Other Ambulatory Visit: Payer: Self-pay

## 2019-07-21 ENCOUNTER — Other Ambulatory Visit: Payer: Self-pay

## 2019-07-21 ENCOUNTER — Ambulatory Visit (HOSPITAL_COMMUNITY)
Admission: RE | Admit: 2019-07-21 | Discharge: 2019-07-21 | Disposition: A | Discharge status: Home / Self Care | Payer: BC Managed Care – PPO | Source: Ambulatory Visit | Attending: General Surgery | Admitting: General Surgery

## 2019-07-21 ENCOUNTER — Ambulatory Visit (INDEPENDENT_AMBULATORY_CARE_PROVIDER_SITE_OTHER): Payer: BC Managed Care – PPO | Admitting: Family Medicine

## 2019-07-21 ENCOUNTER — Encounter: Payer: Self-pay | Admitting: Family Medicine

## 2019-07-21 VITALS — BP 122/82 | HR 79 | Temp 96.2°F | Resp 16 | Ht 68.0 in | Wt 265.0 lb

## 2019-07-21 DIAGNOSIS — Z0000 Encounter for general adult medical examination without abnormal findings: Secondary | ICD-10-CM

## 2019-07-21 DIAGNOSIS — M1A09X1 Idiopathic chronic gout, multiple sites, with tophus (tophi): Secondary | ICD-10-CM

## 2019-07-21 DIAGNOSIS — K219 Gastro-esophageal reflux disease without esophagitis: Secondary | ICD-10-CM | POA: Diagnosis not present

## 2019-07-21 DIAGNOSIS — K760 Fatty (change of) liver, not elsewhere classified: Secondary | ICD-10-CM

## 2019-07-21 DIAGNOSIS — E1165 Type 2 diabetes mellitus with hyperglycemia: Secondary | ICD-10-CM

## 2019-07-21 DIAGNOSIS — I10 Essential (primary) hypertension: Secondary | ICD-10-CM | POA: Diagnosis not present

## 2019-07-21 DIAGNOSIS — Z23 Encounter for immunization: Secondary | ICD-10-CM | POA: Diagnosis not present

## 2019-07-21 DIAGNOSIS — E559 Vitamin D deficiency, unspecified: Secondary | ICD-10-CM | POA: Diagnosis not present

## 2019-07-21 DIAGNOSIS — Z6839 Body mass index (BMI) 39.0-39.9, adult: Secondary | ICD-10-CM

## 2019-07-21 DIAGNOSIS — K449 Diaphragmatic hernia without obstruction or gangrene: Secondary | ICD-10-CM | POA: Diagnosis not present

## 2019-07-21 DIAGNOSIS — Z01818 Encounter for other preprocedural examination: Secondary | ICD-10-CM | POA: Diagnosis not present

## 2019-07-21 DIAGNOSIS — E7849 Other hyperlipidemia: Secondary | ICD-10-CM

## 2019-07-21 HISTORY — DX: Fatty (change of) liver, not elsewhere classified: K76.0

## 2019-07-21 MED ORDER — ALLOPURINOL 300 MG PO TABS: 300.0000 mg | ORAL_TABLET | Freq: Every day | ORAL | 3 refills | Status: DC

## 2019-07-28 ENCOUNTER — Encounter (INDEPENDENT_AMBULATORY_CARE_PROVIDER_SITE_OTHER): Payer: Self-pay | Admitting: Family Medicine

## 2019-07-28 ENCOUNTER — Ambulatory Visit (INDEPENDENT_AMBULATORY_CARE_PROVIDER_SITE_OTHER): Payer: BC Managed Care – PPO | Admitting: Family Medicine

## 2019-08-09 ENCOUNTER — Ambulatory Visit (INDEPENDENT_AMBULATORY_CARE_PROVIDER_SITE_OTHER): Payer: BC Managed Care – PPO | Admitting: Psychology

## 2019-08-09 ENCOUNTER — Other Ambulatory Visit (INDEPENDENT_AMBULATORY_CARE_PROVIDER_SITE_OTHER): Payer: Self-pay | Admitting: Family Medicine

## 2019-08-09 DIAGNOSIS — I10 Essential (primary) hypertension: Secondary | ICD-10-CM

## 2019-08-09 DIAGNOSIS — E1165 Type 2 diabetes mellitus with hyperglycemia: Secondary | ICD-10-CM

## 2019-08-10 ENCOUNTER — Other Ambulatory Visit (INDEPENDENT_AMBULATORY_CARE_PROVIDER_SITE_OTHER): Payer: Self-pay | Admitting: Family Medicine

## 2019-08-10 DIAGNOSIS — E118 Type 2 diabetes mellitus with unspecified complications: Secondary | ICD-10-CM

## 2019-08-17 ENCOUNTER — Other Ambulatory Visit: Payer: Self-pay

## 2019-08-17 ENCOUNTER — Ambulatory Visit (INDEPENDENT_AMBULATORY_CARE_PROVIDER_SITE_OTHER): Payer: BC Managed Care – PPO | Admitting: Family Medicine

## 2019-08-17 ENCOUNTER — Encounter (INDEPENDENT_AMBULATORY_CARE_PROVIDER_SITE_OTHER): Payer: Self-pay | Admitting: Family Medicine

## 2019-08-17 VITALS — BP 129/73 | HR 88 | Temp 97.7°F | Ht 68.0 in | Wt 261.0 lb

## 2019-08-17 DIAGNOSIS — Z9189 Other specified personal risk factors, not elsewhere classified: Secondary | ICD-10-CM | POA: Diagnosis not present

## 2019-08-17 DIAGNOSIS — E1165 Type 2 diabetes mellitus with hyperglycemia: Secondary | ICD-10-CM | POA: Diagnosis not present

## 2019-08-17 DIAGNOSIS — E559 Vitamin D deficiency, unspecified: Secondary | ICD-10-CM | POA: Diagnosis not present

## 2019-08-17 DIAGNOSIS — I10 Essential (primary) hypertension: Secondary | ICD-10-CM | POA: Diagnosis not present

## 2019-08-17 DIAGNOSIS — Z6839 Body mass index (BMI) 39.0-39.9, adult: Secondary | ICD-10-CM

## 2019-08-17 MED ORDER — CANAGLIFLOZIN 100 MG PO TABS: 100.0000 mg | ORAL_TABLET | Freq: Every day | ORAL | 0 refills | Status: DC

## 2019-08-17 MED ORDER — VITAMIN D (ERGOCALCIFEROL) 1.25 MG (50000 UNIT) PO CAPS: 50000.0000 [IU] | ORAL_CAPSULE | ORAL | 0 refills | Status: DC

## 2019-08-17 MED ORDER — CONTOUR NEXT TEST VI STRP: ORAL_STRIP | 0 refills | Status: DC

## 2019-08-17 MED ORDER — LISINOPRIL 5 MG PO TABS: 5.0000 mg | ORAL_TABLET | Freq: Every day | ORAL | 0 refills | Status: DC

## 2019-08-17 MED ORDER — METFORMIN HCL 500 MG PO TABS: 500.0000 mg | ORAL_TABLET | Freq: Three times a day (TID) | ORAL | 0 refills | Status: DC

## 2019-08-18 NOTE — Progress Notes (Signed)
Chief Complaint:   OBESITY Steven Hodge is here to discuss his progress with his obesity treatment plan along with follow-up of his obesity related diagnoses. Steven Hodge is on the Category 3 Plan and states he is following his eating plan approximately 100% of the time. Steven Hodge states he is active while working 5-6 times per week, and walking 1 time per week.  Today's visit was #: 37 Starting weight: 272 lbs Starting date: 09/18/2016 Today's weight: 261 lbs Today's date: 08/17/2019 Total lbs lost to date: 11 Total lbs lost since last in-office visit: 0  Interim History: Steven Hodge is planning on having RNY-gastric bypass in approximately 4-6 weeks with Dr. Redmond Hodge. He was told he should not lose weight further due to insurance requirements.  Subjective:   1. Type 2 diabetes mellitus with hyperglycemia, without long-term current use of insulin (HCC) Steven Hodge is getting ready to have metabolic weight loss surgery. He requests a refill today.  2. Vitamin D deficiency Steven Hodge is stable on Vit D, and he requests a refill today.  3. Essential hypertension Steven Hodge blood pressure is stable on medications and she denies lightheadedness.  4. At risk for hypoglycemia Steven Hodge is at increased risk for hypoglycemia due to status post weight loss surgery. Steven Hodge is not currently taking insulin.   Assessment/Plan:   1. Type 2 diabetes mellitus with hyperglycemia, without long-term current use of insulin (HCC) Good blood sugar control is important to decrease the likelihood of diabetic complications such as nephropathy, neuropathy, limb loss, blindness, coronary artery disease, and death. Intensive lifestyle modification including diet, exercise and weight loss are the first line of treatment for diabetes. We will refill Invokana, metformin, and test strips for 90 days with no refills. 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  - canagliflozin (INVOKANA) 100 MG TABS tablet; Take 1 tablet  (100 mg total) by mouth daily before breakfast.  Dispense: 90 tablet; Refill: 0 - metFORMIN (GLUCOPHAGE) 500 MG tablet; Take 1 tablet (500 mg total) by mouth 3 (three) times daily.  Dispense: 270 tablet; Refill: 0 - glucose blood (CONTOUR NEXT TEST) test strip; Use as instructed  Dispense: 100 strip; Refill: 0  2. Vitamin D deficiency Low Vitamin D level contributes to fatigue and are associated with obesity, breast, and colon cancer. We will refill prescription Vitamin D for 90 days with no refills. Steven Hodge will follow-up for routine testing of Vitamin D, at least 2-3 times per year to avoid over-replacement.  - Vitamin D, Ergocalciferol, (DRISDOL) 1.25 MG (50000 UNIT) CAPS capsule; Take 1 capsule (50,000 Units total) by mouth every 7 (seven) days.  Dispense: 12 capsule; Refill: 0  3. Essential hypertension Steven Hodge is working on healthy weight loss and exercise to improve blood pressure control. We will watch for signs of hypotension as he continues his lifestyle modifications. We will refill lisinopril for 90 days with no refills.  - lisinopril (ZESTRIL) 5 MG tablet; Take 1 tablet (5 mg total) by mouth daily.  Dispense: 90 tablet; Refill: 0  4. At risk for hypoglycemia Steven Hodge was given approximately 15 minutes of counseling today regarding prevention of hypoglycemia. He was advised of symptoms of hypoglycemia. Steven Hodge was instructed to avoid skipping meals, eat regular protein Steven Hodge meals and schedule low calorie snacks as needed.   Repetitive spaced learning was employed today to elicit superior memory formation and behavioral change  5. Class 2 severe obesity with serious comorbidity and body mass index (BMI) of 39.0 to 39.9 in adult, unspecified obesity  type Steven Hodge) Steven Hodge is currently in the action stage of change. As such, his goal is to continue with weight loss efforts. He has agreed to the Category 3 Plan.   Steven Hodge will transition care to Dr. Redmond Hodge, and he may see [name] as needed in the  future.  Exercise goals: As is.  Behavioral modification strategies: increasing lean protein intake.    Objective:   Blood pressure 129/73, pulse 88, temperature 97.7 F (36.5 C), temperature source Oral, height 5\' 8"  (1.727 m), weight 261 lb (118.4 kg), SpO2 96 %. Body mass index is 39.68 kg/m.  General: Cooperative, alert, well developed, in no acute distress. HEENT: Conjunctivae and lids unremarkable. Cardiovascular: Regular rhythm.  Lungs: Normal work of breathing. Neurologic: No focal deficits.   Lab Results  Component Value Date   CREATININE 0.98 05/02/2019   BUN 21 05/02/2019   NA 142 05/02/2019   K 5.1 05/02/2019   CL 103 05/02/2019   CO2 17 (L) 05/02/2019   Lab Results  Component Value Date   ALT 77 (H) 05/02/2019   AST 42 (H) 05/02/2019   ALKPHOS 75 05/02/2019   BILITOT 0.3 05/02/2019   Lab Results  Component Value Date   HGBA1C 7.6 (H) 05/02/2019   HGBA1C 7.6 (H) 12/29/2018   HGBA1C 6.7 (H) 06/24/2018   HGBA1C 6.3 (H) 02/23/2018   HGBA1C 6.1 (H) 10/12/2017   Lab Results  Component Value Date   INSULIN 10.4 05/02/2019   INSULIN 26.7 (H) 06/24/2018   INSULIN 28.0 (H) 02/23/2018   INSULIN 31.8 (H) 10/12/2017   INSULIN 30.2 (H) 02/12/2017   Lab Results  Component Value Date   TSH 2.230 09/18/2016   Lab Results  Component Value Date   CHOL 207 (H) 05/02/2019   HDL 44 05/02/2019   LDLCALC 142 (H) 05/02/2019   TRIG 116 05/02/2019   CHOLHDL 5 12/29/2018   Lab Results  Component Value Date   WBC 8.0 01/17/2019   HGB 14.4 01/17/2019   HCT 42.7 01/17/2019   MCV 91.6 01/17/2019   PLT 156.0 01/17/2019   No results found for: IRON, TIBC, FERRITIN  Attestation Statements:   Reviewed by clinician on day of visit: allergies, medications, problem list, medical history, surgical history, family history, social history, and previous encounter notes.   I, Steven Hodge, am acting as transcriptionist for Steven Nip, MD.  I have reviewed the  above documentation for accuracy and completeness, and I agree with the above. -  Steven Nip, MD

## 2019-08-23 ENCOUNTER — Ambulatory Visit (INDEPENDENT_AMBULATORY_CARE_PROVIDER_SITE_OTHER): Payer: BC Managed Care – PPO | Admitting: Psychology

## 2019-09-07 ENCOUNTER — Encounter: Payer: Self-pay | Admitting: Dietician

## 2019-09-07 ENCOUNTER — Encounter: Payer: BC Managed Care – PPO | Attending: General Surgery | Admitting: Dietician

## 2019-09-07 ENCOUNTER — Other Ambulatory Visit: Payer: Self-pay

## 2019-09-07 DIAGNOSIS — E669 Obesity, unspecified: Secondary | ICD-10-CM | POA: Diagnosis not present

## 2019-09-07 NOTE — Patient Instructions (Signed)
Begin working though the [place] discussed today.   See you at Pre-Op Class on Monday!

## 2019-09-07 NOTE — Progress Notes (Signed)
Nutrition Assessment for Bariatric Surgery Medical Nutrition Therapy   Patient was seen on 09/07/2019 for Pre-Operative Nutrition Assessment. Letter of approval faxed to [facility] bariatric surgery program coordinator on 09/07/2019.   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 Would like to have surgery in order to better control blood sugar. Pt is knowledegable about nutrition, having received nutrition education in the past for weight and blood sugar management.  Typical meal pattern is 3 meals per day plus snacks. May have a sandwich with meat and veggies for a meal as well, and may snack on beef jerky and Fairlife and Premier Protein shakes.   24-Hr Dietary Recall First Meal: 2 eggs + pork chop + coffee  Snack: almonds Second Meal: sardines + vegetable  Snack: pork skins Third Meal: meat + vegetable + water Snack: protein shake  Beverages: water, coffee, Coke Zero, Gatorade Zero   NUTRITION DIAGNOSIS  Overweight/obesity ([place]-3.3) related to past poor dietary habits and physical inactivity as evidenced by patient w/ planned RYGB surgery following dietary guidelines for continued weight loss.    NUTRITION INTERVENTION  Nutrition counseling (C-1) and education (E-2) to facilitate bariatric surgery goals.  Pre-Op Goals Reviewed with the Patient . Track food and beverage intake (pen and paper, MyFitness Pal, Baritastic app, etc.) . Make healthy food choices while monitoring portion sizes . Consume 3 meals per day or try to eat every 3-5  hours . Avoid concentrated sugars and fried foods . Keep sugar & fat in the single digits per serving on food labels . Practice CHEWING your food (aim for applesauce consistency) . Practice not drinking 15 minutes before, during, and 30 minutes after each meal and snack . Avoid all carbonated beverages (ex: soda, sparkling beverages)  . Limit caffeinated beverages (ex: coffee, tea, energy drinks) . Avoid all sugar-sweetened beverages (ex: regular soda, sports drinks)  . Avoid alcohol  . Aim for 64-100 ounces of FLUID daily (with at least half of fluid intake being plain water)  . Aim for at least 60-80 grams of PROTEIN daily . Look for a liquid protein source that contains ?15 g protein and ?5 g carbohydrate (ex: shakes, drinks, shots) . Make a list of non-food related activities . Physical activity is an important part of a healthy lifestyle so keep it moving! The goal is to reach 150 minutes of exercise per week, including cardiovascular and weight baring activity.  Handouts Provided Include  . Bariatric Surgery handouts (Nutrition Visits, Pre-Op Goals, Protein Shakes, Vitamins & Minerals)  Learning Style & Readiness for Change Teaching method utilized: Visual & Auditory  Demonstrated degree of understanding via: Teach Back  Barriers to learning/adherence to lifestyle change: None Identified   MONITORING & EVALUATION Dietary intake, weekly physical activity, body weight, and pre-op goals reached at next nutrition visit.   Next Steps Patient is to follow up at [facility] for Pre-Op Class on Monday, September 12, 2019 for further nutrition education.

## 2019-09-08 ENCOUNTER — Encounter (INDEPENDENT_AMBULATORY_CARE_PROVIDER_SITE_OTHER): Payer: Self-pay | Admitting: Family Medicine

## 2019-09-08 NOTE — Telephone Encounter (Signed)
FYI

## 2019-09-12 ENCOUNTER — Encounter (INDEPENDENT_AMBULATORY_CARE_PROVIDER_SITE_OTHER): Payer: Self-pay | Admitting: Family Medicine

## 2019-09-12 ENCOUNTER — Other Ambulatory Visit: Payer: Self-pay

## 2019-09-12 ENCOUNTER — Encounter: Payer: BC Managed Care – PPO | Attending: General Surgery | Admitting: Skilled Nursing Facility1

## 2019-09-12 DIAGNOSIS — E669 Obesity, unspecified: Secondary | ICD-10-CM | POA: Insufficient documentation

## 2019-09-12 NOTE — Progress Notes (Signed)
Pre-Operative Nutrition Class:  Appt start time: 2957   End time:  1830.  Patient was seen on 09/12/2019 for Pre-Operative Bariatric Surgery [facility]   Surgery date:  Surgery type: RYGB Start weight at [facility]: 261.8 Weight today: 265  Samples given per MNT protocol. Patient educated on appropriate usage: Bariatric Advantage Multivitamin Lot #[id number] Exp:08/21  Bariatric Advantage Calcium  Lot #[id number] Exp:06/11/2020  Protein Shake Lot #[id number] Exp:10/24/2020  The following the learning objectives were met by the patient during this course:  Identify Pre-Op Dietary Goals and will begin 2 weeks pre-operatively  Identify appropriate sources of fluids and proteins   State protein recommendations and appropriate sources pre and post-operatively  Identify Post-Operative Dietary Goals and will follow for 2 weeks post-operatively  Identify appropriate multivitamin and calcium sources  Describe the need for physical activity post-operatively and will follow MD recommendations  State when to call healthcare provider regarding medication questions or post-operative complications  Handouts given during class include:  Pre-Op Bariatric Surgery Diet Handout  Protein Shake Handout  Post-Op Bariatric Surgery Nutrition Handout  [facility] Program Information Flyer  Support Group Information Flyer  [facility] Bariatric Supplements Price List  Follow-Up Plan: Patient will follow-up at [facility] 2 weeks post operatively for diet advancement per MD.

## 2019-09-13 NOTE — Telephone Encounter (Signed)
Please advise 

## 2019-09-26 ENCOUNTER — Ambulatory Visit: Payer: Self-pay | Admitting: General Surgery

## 2019-09-27 NOTE — Patient Instructions (Signed)
DUE TO COVID-19 ONLY ONE VISITOR IS ALLOWED TO COME WITH YOU AND STAY IN THE WAITING ROOM ONLY DURING PRE OP AND PROCEDURE DAY OF SURGERY. THE 2 VISITORS MAY VISIT WITH YOU AFTER SURGERY IN YOUR PRIVATE ROOM DURING VISITING HOURS ONLY!  YOU NEED TO HAVE A COVID 19 TEST ON_4/22______ @_______ , THIS TEST MUST BE DONE BEFORE SURGERY, COME  [address] , [id number].  ([place]) ONCE YOUR COVID TEST IS COMPLETED, PLEASE BEGIN THE QUARANTINE INSTRUCTIONS AS OUTLINED IN YOUR HANDOUT.                Steven Hodge    Your procedure is scheduled on: 10/03/19   Report to [facility] Main  Entrance   Report to Short Stay at 5:30 AM     Call this number if you have problems the morning of surgery [phone]   . BRUSH YOUR TEETH MORNING OF SURGERY AND RINSE YOUR MOUTH OUT, NO CHEWING GUM CANDY OR MINTS.   NO SOLID FOOD AFTER 6:00 PM THE NIGHT BEFORE YOUR SURGERY.   YOU MAY DRINK CLEAR FLUIDS. THE G2 DRINK YOU DRINK BEFORE YOU LEAVE HOME WILL BE THE LAST FLUIDS YOU DRINK BEFORE SURGERY.  PAIN IS EXPECTED AFTER SURGERY AND WILL NOT BE COMPLETELY ELIMINATED.   AMBULATION AND TYLENOL WILL HELP REDUCE INCISIONAL AND GAS PAIN. MOVEMENT IS KEY!  YOU ARE EXPECTED TO BE OUT OF BED WITHIN 4 HOURS OF ADMISSION TO YOUR PATIENT ROOM.  SITTING IN THE RECLINER THROUGHOUT THE DAY IS IMPORTANT FOR DRINKING FLUIDS AND MOVING GAS THROUGHOUT THE GI TRACT.  COMPRESSION STOCKINGS SHOULD BE WORN [place] UNLESS YOU ARE WALKING.   INCENTIVE SPIROMETER SHOULD BE USED EVERY HOUR WHILE AWAKE TO DECREASE POST-OPERATIVE COMPLICATIONS SUCH AS PNEUMONIA.  WHEN DISCHARGED HOME, IT IS IMPORTANT TO CONTINUE TO WALK EVERY HOUR AND USE THE INCENTIVE SPIROMETER EVERY HOUR.    YOU MAY HAVE CLEAR LIQUIDS FROM MIDNIGHT UNTIL 4:30AM  . At 4:30AM Please finish the prescribed Pre-Surgery Gatorade drink.   Nothing by mouth after you finish the Gatorade drink  !   Take these medicines the morning of surgery with A SIP OF WATER: None  DO NOT TAKE ANY DIABETIC MEDICATIONS DAY OF YOUR SURGERY      How to Manage Your Diabetes Before and After Surgery  Why is it important to control my blood sugar before and after surgery? . Improving blood sugar levels before and after surgery helps healing and can limit problems. . A way of improving blood sugar control is eating a healthy diet by: o  Eating less sugar and carbohydrates o  Increasing activity/exercise o  Talking with your doctor about reaching your blood sugar goals . High blood sugars (greater than 180 mg/dL) can raise your risk of infections and slow your recovery, so you will need to focus on controlling your diabetes during the weeks before surgery. . Make sure that the doctor who takes care of your diabetes knows about your planned surgery including the date and location.  How do I manage my blood sugar before surgery? . Check your blood sugar at least 4 times a day, starting 2 days before surgery, to make sure that the level is not too high or low. o Check your blood sugar the morning of your surgery when you wake up and every 2 hours until you get to the Short Stay unit. . If your blood sugar is less than 70 mg/dL,  you will need to treat for low blood sugar: o Do not take insulin. o Treat a low blood sugar (less than 70 mg/dL) with  cup of clear juice (cranberry or apple), 4 glucose tablets, OR glucose gel. o Recheck blood sugar in 15 minutes after treatment (to make sure it is greater than 70 mg/dL). If your blood sugar is not greater than 70 mg/dL on recheck, call [phone] for further instructions. . Report your blood sugar to the short stay nurse when you get to Short Stay.  . If you are admitted to the hospital after surgery: o Your blood sugar will be checked by the staff and you will probably be given insulin after surgery (instead of oral diabetes medicines) to make sure you  have good blood sugar levels. o The goal for blood sugar control after surgery is 80-180 mg/dL.   WHAT DO I DO ABOUT MY DIABETES MEDICATION?   . THE DAY BEFORE SURGERY, Don't take the Invokana but you may take the Metformin      THE MORNING OF SURGERY DON'T TAKE ANY DIABETIC MEDICATIONS                        You may not have any metal on your body including              piercings  Do not wear jewelry,  lotions, powders or  deodorant              Men may shave face and neck.   Do not bring valuables to the hospital. [place].  Contacts, dentures or bridgework may not be worn into surgery.       Name and phone number of your driver:  Special Instructions: N/A              Please read over the following fact sheets you were given: _____________________________________________________________________             [facility] - Preparing for Surgery Before surgery, you can play an important role.   Because skin is not sterile, your skin needs to be as free of germs as possible.    You can reduce the number of germs on your skin by washing with CHG (chlorahexidine gluconate) soap before surgery.   CHG is an antiseptic cleaner which kills germs and bonds with the skin to continue killing germs even after washing. Please DO NOT use if you have an allergy to CHG or antibacterial soaps.   If your skin becomes reddened/irritated stop using the CHG and inform your nurse when you arrive at Short Stay.   You may shave your face/neck.  Please follow these instructions carefully:  1.  Shower with CHG Soap the night before surgery and the  morning of Surgery.  2.  If you choose to wash your hair, wash your hair first as usual with your  normal  shampoo.  3.  After you shampoo, rinse your hair and body thoroughly to remove the  shampoo.                                        4.  Use CHG as you would any other liquid soap.  You can apply  chg directly  to the skin  and wash                       Gently with a scrungie or clean washcloth.  5.  Apply the CHG Soap to your body ONLY FROM THE NECK DOWN.   Do not use on face/ open                           Wound or open sores. Avoid contact with eyes, ears mouth and genitals (private parts).                       Wash face,  Genitals (private parts) with your normal soap.             6.  Wash thoroughly, paying special attention to the area where your surgery  will be performed.  7.  Thoroughly rinse your body with warm water from the neck down.  8.  DO NOT shower/wash with your normal soap after using and rinsing off  the CHG Soap.             9.  Pat yourself dry with a clean towel.            10.  Wear clean pajamas.            11.  Place clean sheets on your bed the night of your first shower and do not  sleep with pets. Day of Surgery : Do not apply any lotions/deodorants the morning of surgery.  Please wear clean clothes to the hospital/surgery center.  FAILURE TO FOLLOW THESE INSTRUCTIONS MAY RESULT IN THE CANCELLATION OF YOUR SURGERY PATIENT SIGNATURE_________________________________  NURSE SIGNATURE__________________________________  ________________________________________________________________________

## 2019-09-29 ENCOUNTER — Encounter (HOSPITAL_COMMUNITY)
Admission: RE | Admit: 2019-09-29 | Discharge: 2019-09-29 | Disposition: A | Discharge status: Home / Self Care | Payer: BC Managed Care – PPO | Source: Ambulatory Visit | Attending: General Surgery | Admitting: General Surgery

## 2019-09-29 ENCOUNTER — Encounter (INDEPENDENT_AMBULATORY_CARE_PROVIDER_SITE_OTHER): Payer: Self-pay | Admitting: Family Medicine

## 2019-09-29 ENCOUNTER — Ambulatory Visit: Payer: Self-pay | Admitting: General Surgery

## 2019-09-29 ENCOUNTER — Other Ambulatory Visit (HOSPITAL_COMMUNITY)
Admission: RE | Admit: 2019-09-29 | Discharge: 2019-09-29 | Disposition: A | Discharge status: Home / Self Care | Payer: BC Managed Care – PPO | Source: Ambulatory Visit | Attending: General Surgery | Admitting: General Surgery

## 2019-09-29 ENCOUNTER — Encounter (HOSPITAL_COMMUNITY): Payer: Self-pay

## 2019-09-29 ENCOUNTER — Other Ambulatory Visit: Payer: Self-pay

## 2019-09-29 DIAGNOSIS — Z01812 Encounter for preprocedural laboratory examination: Secondary | ICD-10-CM | POA: Insufficient documentation

## 2019-09-29 DIAGNOSIS — I10 Essential (primary) hypertension: Secondary | ICD-10-CM | POA: Diagnosis not present

## 2019-09-29 DIAGNOSIS — E7800 Pure hypercholesterolemia, unspecified: Secondary | ICD-10-CM | POA: Diagnosis not present

## 2019-09-29 DIAGNOSIS — Z20822 Contact with and (suspected) exposure to covid-19: Secondary | ICD-10-CM | POA: Diagnosis not present

## 2019-09-29 DIAGNOSIS — R7401 Elevation of levels of liver transaminase levels: Secondary | ICD-10-CM | POA: Diagnosis not present

## 2019-09-29 HISTORY — DX: Unspecified osteoarthritis, unspecified site: M19.90

## 2019-09-29 LAB — CBC WITH DIFFERENTIAL/PLATELET
Abs Immature Granulocytes: 0.01 10*3/uL (ref 0.00–0.07)
Basophils Absolute: 0 10*3/uL (ref 0.0–0.1)
Basophils Relative: 0 %
Eosinophils Absolute: 0.1 10*3/uL (ref 0.0–0.5)
Eosinophils Relative: 2 %
HCT: 47.8 % (ref 39.0–52.0)
Hemoglobin: 16.3 g/dL (ref 13.0–17.0)
Immature Granulocytes: 0 %
Lymphocytes Relative: 30 %
Lymphs Abs: 2.2 10*3/uL (ref 0.7–4.0)
MCH: 31.1 pg (ref 26.0–34.0)
MCHC: 34.1 g/dL (ref 30.0–36.0)
MCV: 91.2 fL (ref 80.0–100.0)
Monocytes Absolute: 0.6 10*3/uL (ref 0.1–1.0)
Monocytes Relative: 8 %
Neutro Abs: 4.3 10*3/uL (ref 1.7–7.7)
Neutrophils Relative %: 60 %
Platelets: 178 10*3/uL (ref 150–400)
RBC: 5.24 MIL/uL (ref 4.22–5.81)
RDW: 13.2 % (ref 11.5–15.5)
WBC: 7.2 10*3/uL (ref 4.0–10.5)
nRBC: 0 % (ref 0.0–0.2)

## 2019-09-29 LAB — GLUCOSE, CAPILLARY: Glucose-Capillary: 180 mg/dL — ABNORMAL HIGH (ref 70–99)

## 2019-09-29 LAB — COMPREHENSIVE METABOLIC PANEL
ALT: 47 U/L — ABNORMAL HIGH (ref 0–44)
AST: 25 U/L (ref 15–41)
Albumin: 4 g/dL (ref 3.5–5.0)
Alkaline Phosphatase: 61 U/L (ref 38–126)
Anion gap: 8 (ref 5–15)
BUN: 25 mg/dL — ABNORMAL HIGH (ref 6–20)
CO2: 26 mmol/L (ref 22–32)
Calcium: 8.9 mg/dL (ref 8.9–10.3)
Chloride: 107 mmol/L (ref 98–111)
Creatinine, Ser: 0.87 mg/dL (ref 0.61–1.24)
GFR calc Af Amer: >60 mL/min (ref >60)
GFR calc non Af Amer: >60 mL/min (ref >60)
Glucose, Bld: 170 mg/dL — ABNORMAL HIGH (ref 70–99)
Potassium: 4.4 mmol/L (ref 3.5–5.1)
Sodium: 141 mmol/L (ref 135–145)
Total Bilirubin: 0.2 mg/dL — ABNORMAL LOW (ref 0.3–1.2)
Total Protein: 7 g/dL (ref 6.5–8.1)

## 2019-09-29 LAB — HEMOGLOBIN A1C
Hgb A1c MFr Bld: 7.6 % — ABNORMAL HIGH (ref 4.8–5.6)
Mean Plasma Glucose: 171.42 mg/dL

## 2019-09-29 LAB — SARS CORONAVIRUS 2 (TAT 6-24 HRS): SARS Coronavirus 2: NEGATIVE

## 2019-09-29 LAB — ABO/RH: ABO/RH(D): A POS

## 2019-09-29 NOTE — H&P (View-Only) (Signed)
Steven Hodge Documented: 09/29/2019 8:38 AM Location: [facility] Patient #: [id number] DOB: [birth year] Married / Language: Cleophus Molt / Race: White Male  History of Present Illness Steven Hodge M. Steven Eckrich MD; 09/29/2019 8:57 AM) The patient is a 59 year old male who presents for a bariatric surgery evaluation. He comes in today for a second visit regarding his obesity and related comorbidities including hypertension, dyslipidemia, diabetes mellitus type 2, elevated AST/ALT. He has completed his bariatric surgery pathway. He denies any medical changes since I initially saw him at the end of January. He denies any trips to the emergency room or hospital. He denies any chest tightness, chest pressure, chest pain, source of breath, orthopnea, dyspnea on exertion, TIAs or amaurosis fugax. He had an upper GI that showed a small hiatal hernia above a Schatzki's ring with some GERD. He states that he eats late at night he may have some GERD. His chest x-ray was unremarkable. He had a full set of labs back in the fall of 2020. We did check a TSH and coag saw him in January which were normal. He does have a known umbilical hernia. He has not had any medication changes. Still on metformin, lisinopril, allopurinol, and invokana and some supplements for cholesterol   07/06/19 He is referred by Dr Steven Hodge for consideration of weight loss surgery. He completed our seminar in person. He is interested in the procedure that is going to have the best improvement in his diabetes. He is interested in gastric bypass. Despite several attempts for sustained weight loss he has not had long-term success. He has tried [employer], working with the nutritionist and most recently working with and obesity medicine physician and nutritionist for the past several months. He has had some weight loss but his diabetes has not significantly improved.  His comorbidities include hypertension, dyslipidemia, diabetes mellitus  since 2016, elevated AST ALT  He denies any chest pain, chest pressure, source of breath, orthopnea, paroxysmal nocturnal dyspnea, TIAs or amaurosis fugax. He denies any prior heart attacks. He had a superficial venous clot in his lower extremity if years ago but no DVT. He does have some issues with lower extremity edema around his ankles more so on the right because he has had an ACL injury on the right. His sleep apnea questionnaire was low risk. He has very rare GERD symptoms. He has a bowel movement daily. He denies any prior abdominal surgery. He states that he has a small umbilical hernia. He denies any dysuria or hematuria. He states he has some occasional right leg sciatica. He has had right ACL surgery. He states he was diagnosed with diabetes in 2016 and is on medication but not insulin. He has very rare migraines. He quit smoking at age 13. He denies any drug or alcohol. 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  I reviewed Dr. Migdalia Hodge office note from January 2021 in December 2020. I also reviewed labs from November 2020 which showed a total cholesterol level of 207, LDL 142, AST 42, ALT 77, A1c 7.6. I also reviewed a CBC from August 2020 which was within normal limits. I also reviewed a lower extremity duplex scan from September 2016 which showed no evidence of bilateral lower extremity DVT.   Problem List/Past Medical Steven Hodge M. Steven Pulling, MD; 09/29/2019 8:59 AM) HIATAL HERNIA (K44.9) HYPERCHOLESTEREMIA (E78.00) HYPERTENSION, ESSENTIAL (I10) ELEVATED LIVER TRANSAMINASE LEVEL (R74.01) SEVERE OBESITY (E66.01) I think the patient is a good candidate for weight loss surgery  specifically Roux-en-Y gastric bypass. UMBILICAL HERNIA WITHOUT OBSTRUCTION AND WITHOUT GANGRENE (K42.9)  Past Surgical History Steven Hodge M. Steven Pulling, MD; 09/29/2019 8:59 AM) Knee Surgery Right.  Diagnostic Studies History Steven Hodge M. Steven Pulling, MD; 09/29/2019 8:59 AM) Colonoscopy  never  Allergies Steven Hodge Steven Hodge, RMA; 09/29/2019 8:38 AM) No Known Drug Allergies [03/05/2015]: Allergies Reconciled  Medication History Steven Hodge M. Steven Pulling, MD; 09/29/2019 8:59 AM) MetFORMIN HCl (500MG  Tablet, Oral) Active. Vitamin D (Ergocalciferol) (1.25 MG(50000 UT) Capsule, Oral) Active. Lisinopril (5MG  Tablet, Oral) Active. Allopurinol (300MG  Tablet, Oral) Active. EQL CoQ10 (300MG  Capsule, Oral) Active. Turmeric (Oral) Specific strength unknown - Active. Multi Vitamin (Oral) Active. Aspirin (325MG  Tablet, Oral) Active. Fish Oil (1000MG  Capsule, Oral) Active. Glucosamine (500MG  Capsule, Oral) Active. Medications Reconciled Microlet Lancets Active.  Social History Steven Hodge M. Steven Pulling, MD; 09/29/2019 8:59 AM) Alcohol use Remotely quit alcohol use. Caffeine use Coffee. No drug use Tobacco use Former smoker.  Family History Steven Hodge M. Steven Pulling, MD; 09/29/2019 8:59 AM) Cancer Father, Sister. Colon Polyps Mother. Diabetes Mellitus Father, Mother, Sister. Melanoma Brother.  Other Problems Steven Hodge M. Steven Pulling, MD; 09/29/2019 8:59 AM) DIABETES MELLITUS TYPE 2 IN OBESE (E11.69)     Review of Systems Steven Hodge M. Steven Boot MD; 09/29/2019 8:57 AM) General Not Present- Appetite Loss, Chills, Fatigue, Fever, Night Sweats, Weight Gain and Weight Loss. Skin Not Present- Change in Wart/Mole, Dryness, Hives, Jaundice, New Lesions, Non-Healing Wounds, Rash and Ulcer. HEENT Present- Wears glasses/contact lenses. Not Present- Earache, Hearing Loss, Hoarseness, Nose Bleed, Oral Ulcers, Ringing in the Ears, Seasonal Allergies, Sinus Pain, Sore Throat, Visual Disturbances and Yellow Eyes. Respiratory Present- Snoring. Not Present- Bloody sputum, Chronic Cough, Difficulty Breathing and Wheezing. Breast Not Present- Breast Mass, Breast Pain, Nipple Discharge and Skin Changes. Cardiovascular Present- Swelling of Extremities. Not Present- Chest Pain, Difficulty Breathing Lying Down, Leg Cramps,  Palpitations, Rapid Heart Rate and Shortness of Breath. Gastrointestinal Not Present- Abdominal Pain, Bloating, Bloody Stool, Change in Bowel Habits, Chronic diarrhea, Constipation, Difficulty Swallowing, Excessive gas, Gets full quickly at meals, Hemorrhoids, Indigestion, Nausea, Rectal Pain and Vomiting. Male Genitourinary Not Present- Blood in Urine, Change in Urinary Stream, Frequency, Impotence, Nocturia, Painful Urination, Urgency and Urine Leakage. Musculoskeletal Present- Swelling of Extremities. Not Present- Back Pain, Joint Pain, Joint Stiffness, Muscle Pain and Muscle Weakness. Neurological Not Present- Decreased Memory, Fainting, Headaches, Numbness, Seizures, Tingling, Tremor, Trouble walking and Weakness. Psychiatric Not Present- Anxiety, Bipolar, Change in Sleep Pattern, Depression, Fearful and Frequent crying. Endocrine Present- New Diabetes. Not Present- Cold Intolerance, Excessive Hunger, Hair Changes and Heat Intolerance. Hematology Not Present- Easy Bruising, Excessive bleeding, Gland problems, HIV and Persistent Infections.  Vitals [employer] RMA; 09/29/2019 8:40 AM) 09/29/2019 8:40 AM Weight: 264.2 lb Height: 68in Body Surface Area: 2.3 m Body Mass Index: 40.17 kg/m  Temp.: 9F(Temporal)  Pulse: 79 (Regular)  P.OX: 96% (Room air) BP: 154/88 (Sitting, Left Arm, Standard)        Physical Exam Steven Hodge M. Jaquarious Grey MD; 09/29/2019 8:57 AM)  General Mental Status-Alert. General Appearance-Consistent with stated age. Hydration-Well hydrated. Voice-Normal.  Head and Neck Head-normocephalic, atraumatic with no lesions or palpable masses. Trachea-midline. Thyroid Gland Characteristics - normal size and consistency.  Eye Eyeball - Bilateral-Extraocular movements intact. Sclera/Conjunctiva - Bilateral-No scleral icterus.  Chest and Lung Exam Chest and lung exam reveals -quiet, even and easy respiratory effort with no use of  accessory muscles and on auscultation, normal breath sounds, no adventitious sounds and normal vocal resonance. Inspection Chest Wall - Normal. Back - normal.  Breast - Did not examine.  Cardiovascular Cardiovascular examination reveals -normal heart sounds, regular rate and rhythm with no murmurs and normal pedal pulses bilaterally.  Abdomen Inspection  Inspection of the abdomen reveals: Note: perhaps a very tiny supraumbilical hernia; upper midline diastasis. Skin - Scar - no surgical scars. Palpation/Percussion Palpation and Percussion of the abdomen reveal - Soft, Non Tender, No Rebound tenderness, No Rigidity (guarding) and No hepatosplenomegaly. Auscultation Auscultation of the abdomen reveals - Bowel sounds normal.  Peripheral Vascular Upper Extremity Palpation - Pulses bilaterally normal.  Neurologic Neurologic evaluation reveals -alert and oriented x 3 with no impairment of recent or remote memory. Mental Status-Normal.  Neuropsychiatric The patient's mood and affect are described as -normal. Judgment and Insight-insight is appropriate concerning matters relevant to self.  Musculoskeletal Normal Exam - Left-Upper Extremity Strength Normal and Lower Extremity Strength Normal. Normal Exam - Right-Upper Extremity Strength Normal and Lower Extremity Strength Normal.  Lymphatic Head & Neck  General Head & Neck Lymphatics: Bilateral - Description - Normal. Axillary - Did not examine. Femoral & Inguinal - Did not examine.    Assessment & Plan Steven Hodge M. Kyandra Mcclaine MD; 09/29/2019 8:59 AM)  SEVERE OBESITY (E66.01) Story: I think the patient is a good candidate for weight loss surgery specifically Roux-en-Y gastric bypass. Impression: The patient meets weight loss surgery criteria. I think the patient would be an acceptable candidate for Laparoscopic Roux-en-Y Gastric bypass.  He has completed the bariatric surgery pathway. He has psychological clearance. We  reviewed his upper GI. We discussed the findings of a small hiatal hernia. We will test for one intraoperatively. If found to be clinically significant we will repair it. I discussed with repair when involved. He also has a small supraumbilical fascial defect. We discussed that we may need repaired intraoperatively in order to prevent postoperative bowel obstruction however if it is very tiny less than 1 cm it'll be left alone. He has completed his preoperative education class. He has worked with the bariatric medicine physician for several years. I think he has a good fundamental understanding of long-term compliance with food choices and behaviors. All of his questions were asked and answered.  This patient encounter took 22 minutes today to perform the following: take history, perform exam, review outside records, interpret imaging, counsel the patient on their diagnosis and document encounter, findings & plan in the EHR  Current Plans Pt Education - EMW_preopbariatric  ELEVATED LIVER TRANSAMINASE LEVEL (R74.01) Impression: probably due to fatty liver. had normal cbc. Normal coags.   HYPERTENSION, ESSENTIAL (I10)   HYPERCHOLESTEREMIA (E78.00)   HIATAL HERNIA (K44.9)   DIABETES MELLITUS TYPE 2 IN OBESE (E11.69) Impression: With respect to his diabetes mellitus we discussed outcomes between laparoscopic sleeve gastrectomy versus Roux-en-Y gastric bypass versus conventional medical therapy. We discussed that generally on average gastric bypass patients have better long-term results with diabetes improvement then conventional medical therapy as well as sleeve gastrectomy. We discussed that while there is some improvement with diabetes mellitus with sleeve gastrectomy that overall patients with gastric bypass better improvement in their diabetes control   UMBILICAL HERNIA WITHOUT OBSTRUCTION AND WITHOUT GANGRENE (K42.9)  Leighton Ruff. Steven Pulling, MD, FACS General, Bariatric, & Minimally Invasive  Surgery [facility], [place]

## 2019-09-29 NOTE — Telephone Encounter (Signed)
Please advise 

## 2019-09-29 NOTE — Progress Notes (Signed)
PCP - Dr. Lenna Sciara. Copland Cardiologist - no  Chest x-ray - 07/21/19 EKG - 07/21/19 Stress Test - no ECHO - no Cardiac Cath -no   Sleep Study - no CPAP -   Fasting Blood Sugar - 170-230 Checks Blood Sugar QD_____ times a day  Blood Thinner Instructions:ASA Aspirin Instructions:Dr. Copland said to stop 09/15/19 Last Dose:09/15/19  Anesthesia review:   Patient denies shortness of breath, fever, cough and chest pain at PAT appointment yes  Patient verbalized understanding of instructions that were given to them at the PAT appointment. Patient was also instructed that they will need to review over the PAT instructions again at home before surgery. Yes I told pt to watch his CBGs  And call his PCP if they are trending high

## 2019-09-29 NOTE — H&P (Signed)
[name] Documented: 09/29/2019 8:38 AM Location: [facility] Patient #: [id number] DOB: 1961/01/02 Married / Language: Cleophus Molt / Race: White Male  History of Present Illness Randall Hiss M. Kloi Brodman MD; 09/29/2019 8:57 AM) The patient is a 59 year old male who presents for a bariatric surgery evaluation. He comes in today for a second visit regarding his obesity and related comorbidities including hypertension, dyslipidemia, diabetes mellitus type 2, elevated AST/ALT. He has completed his bariatric surgery pathway. He denies any medical changes since I initially saw him at the end of January. He denies any trips to the emergency room or hospital. He denies any chest tightness, chest pressure, chest pain, source of breath, orthopnea, dyspnea on exertion, TIAs or amaurosis fugax. He had an upper GI that showed a small hiatal hernia above a Schatzki's ring with some GERD. He states that he eats late at night he may have some GERD. His chest x-ray was unremarkable. He had a full set of labs back in the fall of 2020. We did check a TSH and coag saw him in January which were normal. He does have a known umbilical hernia. He has not had any medication changes. Still on metformin, lisinopril, allopurinol, and invokana and some supplements for cholesterol   07/06/19 He is referred by Dr Leafy Ro for consideration of weight loss surgery. He completed our seminar in person. He is interested in the procedure that is going to have the best improvement in his diabetes. He is interested in gastric bypass. Despite several attempts for sustained weight loss he has not had long-term success. He has tried [employer], working with the nutritionist and most recently working with and obesity medicine physician and nutritionist for the past several months. He has had some weight loss but his diabetes has not significantly improved.  His comorbidities include hypertension, dyslipidemia, diabetes mellitus  since 2016, elevated AST ALT  He denies any chest pain, chest pressure, source of breath, orthopnea, paroxysmal nocturnal dyspnea, TIAs or amaurosis fugax. He denies any prior heart attacks. He had a superficial venous clot in his lower extremity if years ago but no DVT. He does have some issues with lower extremity edema around his ankles more so on the right because he has had an ACL injury on the right. His sleep apnea questionnaire was low risk. He has very rare GERD symptoms. He has a bowel movement daily. He denies any prior abdominal surgery. He states that he has a small umbilical hernia. He denies any dysuria or hematuria. He states he has some occasional right leg sciatica. He has had right ACL surgery. He states he was diagnosed with diabetes in 2016 and is on medication but not insulin. He has very rare migraines. He quit smoking at age 70. He denies any drug or alcohol. 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  I reviewed Dr. Migdalia Dk office note from January 2021 in December 2020. I also reviewed labs from November 2020 which showed a total cholesterol level of 207, LDL 142, AST 42, ALT 77, A1c 7.6. I also reviewed a CBC from August 2020 which was within normal limits. I also reviewed a lower extremity duplex scan from September 2016 which showed no evidence of bilateral lower extremity DVT.   Problem List/Past Medical Randall Hiss M. Redmond Pulling, MD; 09/29/2019 8:59 AM) HIATAL HERNIA (K44.9) HYPERCHOLESTEREMIA (E78.00) HYPERTENSION, ESSENTIAL (I10) ELEVATED LIVER TRANSAMINASE LEVEL (R74.01) SEVERE OBESITY (E66.01) I think the patient is a good candidate for weight loss surgery  specifically Roux-en-Y gastric bypass. UMBILICAL HERNIA WITHOUT OBSTRUCTION AND WITHOUT GANGRENE (K42.9)  Past Surgical History Randall Hiss M. Redmond Pulling, MD; 09/29/2019 8:59 AM) Knee Surgery Right.  Diagnostic Studies History Randall Hiss M. Redmond Pulling, MD; 09/29/2019 8:59 AM) Colonoscopy  never  Allergies Geni Bers Saks, RMA; 09/29/2019 8:38 AM) No Known Drug Allergies [03/05/2015]: Allergies Reconciled  Medication History Randall Hiss M. Redmond Pulling, MD; 09/29/2019 8:59 AM) MetFORMIN HCl (500MG  Tablet, Oral) Active. Vitamin D (Ergocalciferol) (1.25 MG(50000 UT) Capsule, Oral) Active. Lisinopril (5MG  Tablet, Oral) Active. Allopurinol (300MG  Tablet, Oral) Active. EQL CoQ10 (300MG  Capsule, Oral) Active. Turmeric (Oral) Specific strength unknown - Active. Multi Vitamin (Oral) Active. Aspirin (325MG  Tablet, Oral) Active. Fish Oil (1000MG  Capsule, Oral) Active. Glucosamine (500MG  Capsule, Oral) Active. Medications Reconciled Microlet Lancets Active.  Social History Randall Hiss M. Redmond Pulling, MD; 09/29/2019 8:59 AM) Alcohol use Remotely quit alcohol use. Caffeine use Coffee. No drug use Tobacco use Former smoker.  Family History Randall Hiss M. Redmond Pulling, MD; 09/29/2019 8:59 AM) Cancer Father, Sister. Colon Polyps Mother. Diabetes Mellitus Father, Mother, Sister. Melanoma Brother.  Other Problems Randall Hiss M. Redmond Pulling, MD; 09/29/2019 8:59 AM) DIABETES MELLITUS TYPE 2 IN OBESE (E11.69)     Review of Systems Randall Hiss M. Keitha Kolk MD; 09/29/2019 8:57 AM) General Not Present- Appetite Loss, Chills, Fatigue, Fever, Night Sweats, Weight Gain and Weight Loss. Skin Not Present- Change in Wart/Mole, Dryness, Hives, Jaundice, New Lesions, Non-Healing Wounds, Rash and Ulcer. HEENT Present- Wears glasses/contact lenses. Not Present- Earache, Hearing Loss, Hoarseness, Nose Bleed, Oral Ulcers, Ringing in the Ears, Seasonal Allergies, Sinus Pain, Sore Throat, Visual Disturbances and Yellow Eyes. Respiratory Present- Snoring. Not Present- Bloody sputum, Chronic Cough, Difficulty Breathing and Wheezing. Breast Not Present- Breast Mass, Breast Pain, Nipple Discharge and Skin Changes. Cardiovascular Present- Swelling of Extremities. Not Present- Chest Pain, Difficulty Breathing Lying Down, Leg Cramps,  Palpitations, Rapid Heart Rate and Shortness of Breath. Gastrointestinal Not Present- Abdominal Pain, Bloating, Bloody Stool, Change in Bowel Habits, Chronic diarrhea, Constipation, Difficulty Swallowing, Excessive gas, Gets full quickly at meals, Hemorrhoids, Indigestion, Nausea, Rectal Pain and Vomiting. Male Genitourinary Not Present- Blood in Urine, Change in Urinary Stream, Frequency, Impotence, Nocturia, Painful Urination, Urgency and Urine Leakage. Musculoskeletal Present- Swelling of Extremities. Not Present- Back Pain, Joint Pain, Joint Stiffness, Muscle Pain and Muscle Weakness. Neurological Not Present- Decreased Memory, Fainting, Headaches, Numbness, Seizures, Tingling, Tremor, Trouble walking and Weakness. Psychiatric Not Present- Anxiety, Bipolar, Change in Sleep Pattern, Depression, Fearful and Frequent crying. Endocrine Present- New Diabetes. Not Present- Cold Intolerance, Excessive Hunger, Hair Changes and Heat Intolerance. Hematology Not Present- Easy Bruising, Excessive bleeding, Gland problems, HIV and Persistent Infections.  Vitals [employer] RMA; 09/29/2019 8:40 AM) 09/29/2019 8:40 AM Weight: 264.2 lb Height: 68in Body Surface Area: 2.3 m Body Mass Index: 40.17 kg/m  Temp.: 60F(Temporal)  Pulse: 79 (Regular)  P.OX: 96% (Room air) BP: 154/88 (Sitting, Left Arm, Standard)        Physical Exam Randall Hiss M. Valyn Latchford MD; 09/29/2019 8:57 AM)  General Mental Status-Alert. General Appearance-Consistent with stated age. Hydration-Well hydrated. Voice-Normal.  Head and Neck Head-normocephalic, atraumatic with no lesions or palpable masses. Trachea-midline. Thyroid Gland Characteristics - normal size and consistency.  Eye Eyeball - Bilateral-Extraocular movements intact. Sclera/Conjunctiva - Bilateral-No scleral icterus.  Chest and Lung Exam Chest and lung exam reveals -quiet, even and easy respiratory effort with no use of  accessory muscles and on auscultation, normal breath sounds, no adventitious sounds and normal vocal resonance. Inspection Chest Wall - Normal. Back - normal.  Breast - Did not examine.  Cardiovascular Cardiovascular examination reveals -normal heart sounds, regular rate and rhythm with no murmurs and normal pedal pulses bilaterally.  Abdomen Inspection  Inspection of the abdomen reveals: Note: perhaps a very tiny supraumbilical hernia; upper midline diastasis. Skin - Scar - no surgical scars. Palpation/Percussion Palpation and Percussion of the abdomen reveal - Soft, Non Tender, No Rebound tenderness, No Rigidity (guarding) and No hepatosplenomegaly. Auscultation Auscultation of the abdomen reveals - Bowel sounds normal.  Peripheral Vascular Upper Extremity Palpation - Pulses bilaterally normal.  Neurologic Neurologic evaluation reveals -alert and oriented x 3 with no impairment of recent or remote memory. Mental Status-Normal.  Neuropsychiatric The patient's mood and affect are described as -normal. Judgment and Insight-insight is appropriate concerning matters relevant to self.  Musculoskeletal Normal Exam - Left-Upper Extremity Strength Normal and Lower Extremity Strength Normal. Normal Exam - Right-Upper Extremity Strength Normal and Lower Extremity Strength Normal.  Lymphatic Head & Neck  General Head & Neck Lymphatics: Bilateral - Description - Normal. Axillary - Did not examine. Femoral & Inguinal - Did not examine.    Assessment & Plan Randall Hiss M. Idrees Quam MD; 09/29/2019 8:59 AM)  SEVERE OBESITY (E66.01) Story: I think the patient is a good candidate for weight loss surgery specifically Roux-en-Y gastric bypass. Impression: The patient meets weight loss surgery criteria. I think the patient would be an acceptable candidate for Laparoscopic Roux-en-Y Gastric bypass.  He has completed the bariatric surgery pathway. He has psychological clearance. We  reviewed his upper GI. We discussed the findings of a small hiatal hernia. We will test for one intraoperatively. If found to be clinically significant we will repair it. I discussed with repair when involved. He also has a small supraumbilical fascial defect. We discussed that we may need repaired intraoperatively in order to prevent postoperative bowel obstruction however if it is very tiny less than 1 cm it'll be left alone. He has completed his preoperative education class. He has worked with the bariatric medicine physician for several years. I think he has a good fundamental understanding of long-term compliance with food choices and behaviors. All of his questions were asked and answered.  This patient encounter took 22 minutes today to perform the following: take history, perform exam, review outside records, interpret imaging, counsel the patient on their diagnosis and document encounter, findings & plan in the EHR  Current Plans Pt Education - EMW_preopbariatric  ELEVATED LIVER TRANSAMINASE LEVEL (R74.01) Impression: probably due to fatty liver. had normal cbc. Normal coags.   HYPERTENSION, ESSENTIAL (I10)   HYPERCHOLESTEREMIA (E78.00)   HIATAL HERNIA (K44.9)   DIABETES MELLITUS TYPE 2 IN OBESE (E11.69) Impression: With respect to his diabetes mellitus we discussed outcomes between laparoscopic sleeve gastrectomy versus Roux-en-Y gastric bypass versus conventional medical therapy. We discussed that generally on average gastric bypass patients have better long-term results with diabetes improvement then conventional medical therapy as well as sleeve gastrectomy. We discussed that while there is some improvement with diabetes mellitus with sleeve gastrectomy that overall patients with gastric bypass better improvement in their diabetes control   UMBILICAL HERNIA WITHOUT OBSTRUCTION AND WITHOUT GANGRENE (K42.9)  Leighton Ruff. Redmond Pulling, MD, FACS General, Bariatric, & Minimally Invasive  Surgery [facility], [place]

## 2019-09-29 NOTE — Telephone Encounter (Signed)
FYI

## 2019-10-02 MED ORDER — BUPIVACAINE LIPOSOME 1.3 % IJ SUSP
20.0000 mL | Freq: Once | INTRAMUSCULAR | Status: DC
  Filled 2019-10-02: qty 20

## 2019-10-02 NOTE — Anesthesia Preprocedure Evaluation (Addendum)
Anesthesia Evaluation  Patient identified by MRN, date of birth, ID band Patient awake    Reviewed: Allergy & Precautions, NPO status , Patient's Chart, lab work & pertinent test results  Airway Mallampati: III  TM Distance: >3 FB Neck ROM: Full    Dental no notable dental hx.    Pulmonary former smoker,    Pulmonary exam normal breath sounds clear to auscultation       Cardiovascular hypertension, Pt. on medications Normal cardiovascular exam Rhythm:Regular Rate:Normal  ECG: NSR, rate 70   Neuro/Psych negative neurological ROS  negative psych ROS   GI/Hepatic negative GI ROS, Neg liver ROS,   Endo/Other  diabetes, Oral Hypoglycemic AgentsMorbid obesity  Renal/GU negative Renal ROS     Musculoskeletal negative musculoskeletal ROS (+) Gout   Abdominal (+) + obese,   Peds  Hematology Superficial VT   Anesthesia Other Findings Morbid Obesity  Reproductive/Obstetrics                            Anesthesia Physical Anesthesia Plan  ASA: III  Anesthesia Plan: General   Post-op Pain Management:    Induction: Intravenous  PONV Risk Score and Plan: 3 and Treatment may vary due to age or medical condition, Midazolam, Dexamethasone and Ondansetron  Airway Management Planned: Oral ETT  Additional Equipment:   Intra-op Plan:   Post-operative Plan: Extubation in OR  Informed Consent: I have reviewed the patients History and Physical, chart, labs and discussed the procedure including the risks, benefits and alternatives for the proposed anesthesia with the patient or authorized representative who has indicated his/her understanding and acceptance.     Dental advisory given  Plan Discussed with: CRNA  Anesthesia Plan Comments:        Anesthesia Quick Evaluation

## 2019-10-03 ENCOUNTER — Inpatient Hospital Stay (HOSPITAL_COMMUNITY)
Admission: RE | Admit: 2019-10-03 | Discharge: 2019-10-04 | DRG: 621 | Disposition: A | Discharge status: Home / Self Care | Payer: BC Managed Care – PPO | Attending: General Surgery | Admitting: General Surgery

## 2019-10-03 ENCOUNTER — Encounter (HOSPITAL_COMMUNITY)
Admission: RE | Disposition: A | Discharge status: Home / Self Care | Payer: Self-pay | Source: Home / Self Care | Attending: General Surgery

## 2019-10-03 ENCOUNTER — Other Ambulatory Visit: Payer: Self-pay

## 2019-10-03 ENCOUNTER — Inpatient Hospital Stay (HOSPITAL_COMMUNITY): Payer: BC Managed Care – PPO | Admitting: Anesthesiology

## 2019-10-03 ENCOUNTER — Encounter (HOSPITAL_COMMUNITY): Payer: Self-pay | Admitting: General Surgery

## 2019-10-03 DIAGNOSIS — E7849 Other hyperlipidemia: Secondary | ICD-10-CM | POA: Diagnosis present

## 2019-10-03 DIAGNOSIS — Z7984 Long term (current) use of oral hypoglycemic drugs: Secondary | ICD-10-CM | POA: Diagnosis not present

## 2019-10-03 DIAGNOSIS — K219 Gastro-esophageal reflux disease without esophagitis: Secondary | ICD-10-CM | POA: Diagnosis not present

## 2019-10-03 DIAGNOSIS — Z79899 Other long term (current) drug therapy: Secondary | ICD-10-CM

## 2019-10-03 DIAGNOSIS — K429 Umbilical hernia without obstruction or gangrene: Secondary | ICD-10-CM | POA: Diagnosis present

## 2019-10-03 DIAGNOSIS — E1165 Type 2 diabetes mellitus with hyperglycemia: Secondary | ICD-10-CM | POA: Diagnosis present

## 2019-10-03 DIAGNOSIS — K760 Fatty (change of) liver, not elsewhere classified: Secondary | ICD-10-CM | POA: Diagnosis not present

## 2019-10-03 DIAGNOSIS — Z20822 Contact with and (suspected) exposure to covid-19: Secondary | ICD-10-CM | POA: Diagnosis not present

## 2019-10-03 DIAGNOSIS — Z6841 Body Mass Index (BMI) 40.0 and over, adult: Secondary | ICD-10-CM | POA: Diagnosis not present

## 2019-10-03 DIAGNOSIS — E7800 Pure hypercholesterolemia, unspecified: Secondary | ICD-10-CM | POA: Diagnosis present

## 2019-10-03 DIAGNOSIS — E785 Hyperlipidemia, unspecified: Secondary | ICD-10-CM | POA: Diagnosis present

## 2019-10-03 DIAGNOSIS — I10 Essential (primary) hypertension: Secondary | ICD-10-CM | POA: Diagnosis not present

## 2019-10-03 DIAGNOSIS — Z808 Family history of malignant neoplasm of other organs or systems: Secondary | ICD-10-CM

## 2019-10-03 DIAGNOSIS — M5431 Sciatica, right side: Secondary | ICD-10-CM | POA: Diagnosis present

## 2019-10-03 DIAGNOSIS — Z87891 Personal history of nicotine dependence: Secondary | ICD-10-CM | POA: Diagnosis not present

## 2019-10-03 DIAGNOSIS — Z833 Family history of diabetes mellitus: Secondary | ICD-10-CM | POA: Diagnosis not present

## 2019-10-03 DIAGNOSIS — M1A09X1 Idiopathic chronic gout, multiple sites, with tophus (tophi): Secondary | ICD-10-CM | POA: Diagnosis present

## 2019-10-03 DIAGNOSIS — K222 Esophageal obstruction: Secondary | ICD-10-CM | POA: Diagnosis not present

## 2019-10-03 DIAGNOSIS — K635 Polyp of colon: Secondary | ICD-10-CM | POA: Diagnosis present

## 2019-10-03 DIAGNOSIS — E119 Type 2 diabetes mellitus without complications: Secondary | ICD-10-CM | POA: Diagnosis not present

## 2019-10-03 DIAGNOSIS — Z9884 Bariatric surgery status: Secondary | ICD-10-CM

## 2019-10-03 DIAGNOSIS — K449 Diaphragmatic hernia without obstruction or gangrene: Secondary | ICD-10-CM | POA: Diagnosis not present

## 2019-10-03 HISTORY — DX: Morbid (severe) obesity due to excess calories: E66.01

## 2019-10-03 HISTORY — PX: UMBILICAL HERNIA REPAIR: SHX196

## 2019-10-03 HISTORY — PX: GASTRIC ROUX-EN-Y: SHX5262

## 2019-10-03 LAB — GLUCOSE, CAPILLARY
Glucose-Capillary: 156 mg/dL — ABNORMAL HIGH (ref 70–99)
Glucose-Capillary: 205 mg/dL — ABNORMAL HIGH (ref 70–99)
Glucose-Capillary: 170 mg/dL — ABNORMAL HIGH (ref 70–99)
Glucose-Capillary: 182 mg/dL — ABNORMAL HIGH (ref 70–99)
Glucose-Capillary: 160 mg/dL — ABNORMAL HIGH (ref 70–99)

## 2019-10-03 LAB — TYPE AND SCREEN
ABO/RH(D): A POS
Antibody Screen: NEGATIVE

## 2019-10-03 LAB — HEMOGLOBIN AND HEMATOCRIT, BLOOD
HCT: 48.8 % (ref 39.0–52.0)
Hemoglobin: 16.1 g/dL (ref 13.0–17.0)

## 2019-10-03 SURGERY — LAPAROSCOPIC ROUX-EN-Y GASTRIC BYPASS WITH UPPER ENDOSCOPY
Anesthesia: General | Site: Abdomen

## 2019-10-03 MED ORDER — ACETAMINOPHEN 500 MG PO TABS
1000.0000 mg | ORAL_TABLET | ORAL | Status: AC
  Administered 2019-10-03: 1000 mg via ORAL
  Filled 2019-10-03: qty 2

## 2019-10-03 MED ORDER — ONDANSETRON HCL 4 MG/2ML IJ SOLN
INTRAMUSCULAR | Status: AC
  Filled 2019-10-03: qty 2

## 2019-10-03 MED ORDER — CHLORHEXIDINE GLUCONATE 4 % EX LIQD: 60.0000 mL | Freq: Once | CUTANEOUS | Status: DC

## 2019-10-03 MED ORDER — LIDOCAINE 2% (20 MG/ML) 5 ML SYRINGE
INTRAMUSCULAR | Status: AC
  Filled 2019-10-03: qty 5

## 2019-10-03 MED ORDER — PROMETHAZINE HCL 25 MG/ML IJ SOLN: 12.5000 mg | Freq: Four times a day (QID) | INTRAMUSCULAR | Status: DC | PRN

## 2019-10-03 MED ORDER — ROCURONIUM BROMIDE 10 MG/ML (PF) SYRINGE
PREFILLED_SYRINGE | INTRAVENOUS | Status: DC | PRN
  Administered 2019-10-03 (×3): 20 mg via INTRAVENOUS
  Administered 2019-10-03: 60 mg via INTRAVENOUS
  Administered 2019-10-03: 20 mg via INTRAVENOUS

## 2019-10-03 MED ORDER — PHENYLEPHRINE 40 MCG/ML (10ML) SYRINGE FOR IV PUSH (FOR BLOOD PRESSURE SUPPORT)
PREFILLED_SYRINGE | INTRAVENOUS | Status: AC
  Filled 2019-10-03: qty 10

## 2019-10-03 MED ORDER — GABAPENTIN 300 MG PO CAPS
300.0000 mg | ORAL_CAPSULE | ORAL | Status: AC
  Administered 2019-10-03: 300 mg via ORAL
  Filled 2019-10-03: qty 1

## 2019-10-03 MED ORDER — OXYCODONE HCL 5 MG PO TABS: 5.0000 mg | ORAL_TABLET | Freq: Once | ORAL | Status: DC | PRN

## 2019-10-03 MED ORDER — LIDOCAINE 20MG/ML (2%) 15 ML SYRINGE OPTIME
INTRAMUSCULAR | Status: DC | PRN
  Administered 2019-10-03: 1.5 mg/kg/h via INTRAVENOUS

## 2019-10-03 MED ORDER — HYDRALAZINE HCL 20 MG/ML IJ SOLN: 10.0000 mg | INTRAMUSCULAR | Status: DC | PRN

## 2019-10-03 MED ORDER — SODIUM CHLORIDE (PF) 0.9 % IJ SOLN
INTRAMUSCULAR | Status: DC | PRN
  Administered 2019-10-03: 50 mL

## 2019-10-03 MED ORDER — 0.9 % SODIUM CHLORIDE (POUR BTL) OPTIME
TOPICAL | Status: DC | PRN
  Administered 2019-10-03: 1000 mL

## 2019-10-03 MED ORDER — FENTANYL CITRATE (PF) 250 MCG/5ML IJ SOLN
INTRAMUSCULAR | Status: AC
  Filled 2019-10-03: qty 5

## 2019-10-03 MED ORDER — PROPOFOL 10 MG/ML IV BOLUS
INTRAVENOUS | Status: DC | PRN
  Administered 2019-10-03: 150 mg via INTRAVENOUS

## 2019-10-03 MED ORDER — DEXAMETHASONE SODIUM PHOSPHATE 4 MG/ML IJ SOLN: 4.0000 mg | INTRAMUSCULAR | Status: DC

## 2019-10-03 MED ORDER — SIMETHICONE 80 MG PO CHEW
80.0000 mg | CHEWABLE_TABLET | Freq: Four times a day (QID) | ORAL | Status: DC | PRN
  Administered 2019-10-03 – 2019-10-04 (×2): 80 mg via ORAL
  Filled 2019-10-03 (×2): qty 1

## 2019-10-03 MED ORDER — SUGAMMADEX SODIUM 500 MG/5ML IV SOLN
INTRAVENOUS | Status: AC
  Filled 2019-10-03: qty 5

## 2019-10-03 MED ORDER — HEPARIN SODIUM (PORCINE) 5000 UNIT/ML IJ SOLN
5000.0000 [IU] | INTRAMUSCULAR | Status: AC
  Administered 2019-10-03: 5000 [IU] via SUBCUTANEOUS
  Filled 2019-10-03: qty 1

## 2019-10-03 MED ORDER — PANTOPRAZOLE SODIUM 40 MG IV SOLR
40.0000 mg | Freq: Every day | INTRAVENOUS | Status: DC
  Administered 2019-10-03: 40 mg via INTRAVENOUS
  Filled 2019-10-03: qty 40

## 2019-10-03 MED ORDER — MIDAZOLAM HCL 2 MG/2ML IJ SOLN
INTRAMUSCULAR | Status: AC
  Filled 2019-10-03: qty 2

## 2019-10-03 MED ORDER — SUGAMMADEX SODIUM 200 MG/2ML IV SOLN
INTRAVENOUS | Status: DC | PRN
  Administered 2019-10-03: 500 mg via INTRAVENOUS

## 2019-10-03 MED ORDER — PROMETHAZINE HCL 25 MG/ML IJ SOLN: 6.2500 mg | INTRAMUSCULAR | Status: DC | PRN

## 2019-10-03 MED ORDER — SODIUM CHLORIDE 0.9 % IV SOLN
2.0000 g | INTRAVENOUS | Status: AC
  Administered 2019-10-03: 07:00:00 2 g via INTRAVENOUS
  Filled 2019-10-03: qty 2

## 2019-10-03 MED ORDER — ENOXAPARIN SODIUM 30 MG/0.3ML ~~LOC~~ SOLN
30.0000 mg | Freq: Two times a day (BID) | SUBCUTANEOUS | Status: DC
  Administered 2019-10-03 – 2019-10-04 (×2): 30 mg via SUBCUTANEOUS
  Filled 2019-10-03 (×2): qty 0.3

## 2019-10-03 MED ORDER — SUCCINYLCHOLINE CHLORIDE 200 MG/10ML IV SOSY
PREFILLED_SYRINGE | INTRAVENOUS | Status: AC
  Filled 2019-10-03: qty 10

## 2019-10-03 MED ORDER — ROCURONIUM BROMIDE 10 MG/ML (PF) SYRINGE
PREFILLED_SYRINGE | INTRAVENOUS | Status: AC
  Filled 2019-10-03: qty 10

## 2019-10-03 MED ORDER — DIPHENHYDRAMINE HCL 50 MG/ML IJ SOLN: 12.5000 mg | Freq: Three times a day (TID) | INTRAMUSCULAR | Status: DC | PRN

## 2019-10-03 MED ORDER — ENSURE MAX PROTEIN PO LIQD
2.0000 [oz_av] | ORAL | Status: DC
  Administered 2019-10-04 (×3): 2 [oz_av] via ORAL

## 2019-10-03 MED ORDER — KETAMINE HCL 10 MG/ML IJ SOLN
INTRAMUSCULAR | Status: AC
  Filled 2019-10-03: qty 1

## 2019-10-03 MED ORDER — GABAPENTIN 100 MG PO CAPS
200.0000 mg | ORAL_CAPSULE | Freq: Two times a day (BID) | ORAL | Status: DC
  Administered 2019-10-03 – 2019-10-04 (×2): 200 mg via ORAL
  Filled 2019-10-03 (×2): qty 2

## 2019-10-03 MED ORDER — POTASSIUM CHLORIDE IN NACL 20-0.45 MEQ/L-% IV SOLN
INTRAVENOUS | Status: DC
  Filled 2019-10-03 (×4): qty 1000

## 2019-10-03 MED ORDER — ALBUMIN HUMAN 5 % IV SOLN: INTRAVENOUS | Status: DC | PRN

## 2019-10-03 MED ORDER — INSULIN ASPART 100 UNIT/ML ~~LOC~~ SOLN
0.0000 [IU] | SUBCUTANEOUS | Status: DC
  Administered 2019-10-03 – 2019-10-04 (×4): 4 [IU] via SUBCUTANEOUS
  Administered 2019-10-04: 3 [IU] via SUBCUTANEOUS
  Administered 2019-10-04: 4 [IU] via SUBCUTANEOUS

## 2019-10-03 MED ORDER — ALLOPURINOL 300 MG PO TABS
300.0000 mg | ORAL_TABLET | Freq: Every day | ORAL | Status: DC
  Administered 2019-10-03 – 2019-10-04 (×2): 300 mg via ORAL
  Filled 2019-10-03 (×2): qty 1

## 2019-10-03 MED ORDER — MIDAZOLAM HCL 5 MG/5ML IJ SOLN
INTRAMUSCULAR | Status: DC | PRN
  Administered 2019-10-03: 2 mg via INTRAVENOUS

## 2019-10-03 MED ORDER — MORPHINE SULFATE (PF) 2 MG/ML IV SOLN: 1.0000 mg | INTRAVENOUS | Status: DC | PRN

## 2019-10-03 MED ORDER — BUPIVACAINE LIPOSOME 1.3 % IJ SUSP
INTRAMUSCULAR | Status: DC | PRN
  Administered 2019-10-03: 20 mL

## 2019-10-03 MED ORDER — LIDOCAINE HCL 2 % IJ SOLN
INTRAMUSCULAR | Status: AC
  Filled 2019-10-03: qty 20

## 2019-10-03 MED ORDER — APREPITANT 40 MG PO CAPS
40.0000 mg | ORAL_CAPSULE | ORAL | Status: AC
  Administered 2019-10-03: 40 mg via ORAL
  Filled 2019-10-03: qty 1

## 2019-10-03 MED ORDER — LACTATED RINGERS IR SOLN
Status: DC | PRN
  Administered 2019-10-03: 1000 mL

## 2019-10-03 MED ORDER — HYDROMORPHONE HCL 1 MG/ML IJ SOLN
0.2500 mg | INTRAMUSCULAR | Status: DC | PRN
  Administered 2019-10-03 (×2): 0.5 mg via INTRAVENOUS

## 2019-10-03 MED ORDER — LIDOCAINE 2% (20 MG/ML) 5 ML SYRINGE
INTRAMUSCULAR | Status: DC | PRN
  Administered 2019-10-03: 80 mg via INTRAVENOUS

## 2019-10-03 MED ORDER — SCOPOLAMINE 1 MG/3DAYS TD PT72
1.0000 | MEDICATED_PATCH | TRANSDERMAL | Status: DC
  Administered 2019-10-03: 1.5 mg via TRANSDERMAL
  Filled 2019-10-03: qty 1

## 2019-10-03 MED ORDER — ONDANSETRON HCL 4 MG/2ML IJ SOLN
INTRAMUSCULAR | Status: DC | PRN
  Administered 2019-10-03: 4 mg via INTRAVENOUS

## 2019-10-03 MED ORDER — ACETAMINOPHEN 160 MG/5ML PO SOLN
1000.0000 mg | Freq: Three times a day (TID) | ORAL | Status: DC
  Administered 2019-10-03 – 2019-10-04 (×2): 1000 mg via ORAL
  Filled 2019-10-03 (×2): qty 40.6

## 2019-10-03 MED ORDER — LISINOPRIL 5 MG PO TABS
5.0000 mg | ORAL_TABLET | Freq: Every day | ORAL | Status: DC
  Administered 2019-10-04: 5 mg via ORAL
  Filled 2019-10-03: qty 1

## 2019-10-03 MED ORDER — PROPOFOL 500 MG/50ML IV EMUL
INTRAVENOUS | Status: AC
  Filled 2019-10-03: qty 50

## 2019-10-03 MED ORDER — OXYCODONE HCL 5 MG/5ML PO SOLN: 5.0000 mg | Freq: Once | ORAL | Status: DC | PRN

## 2019-10-03 MED ORDER — OXYCODONE HCL 5 MG/5ML PO SOLN
5.0000 mg | Freq: Four times a day (QID) | ORAL | Status: DC | PRN
  Administered 2019-10-03 – 2019-10-04 (×4): 5 mg via ORAL
  Filled 2019-10-03 (×4): qty 5

## 2019-10-03 MED ORDER — HYDROMORPHONE HCL 1 MG/ML IJ SOLN
INTRAMUSCULAR | Status: AC
  Filled 2019-10-03: qty 1

## 2019-10-03 MED ORDER — KETAMINE HCL 10 MG/ML IJ SOLN
INTRAMUSCULAR | Status: DC | PRN
  Administered 2019-10-03: 40 mg via INTRAVENOUS

## 2019-10-03 MED ORDER — "VISTASEAL 4 ML SINGLE DOSE KIT "
4.0000 mL | PACK | Freq: Once | CUTANEOUS | Status: AC
  Administered 2019-10-03: 4 mL via TOPICAL
  Filled 2019-10-03: qty 4

## 2019-10-03 MED ORDER — ONDANSETRON HCL 4 MG/2ML IJ SOLN: 4.0000 mg | Freq: Four times a day (QID) | INTRAMUSCULAR | Status: DC | PRN

## 2019-10-03 MED ORDER — FENTANYL CITRATE (PF) 250 MCG/5ML IJ SOLN
INTRAMUSCULAR | Status: DC | PRN
  Administered 2019-10-03 (×2): 50 ug via INTRAVENOUS
  Administered 2019-10-03: 100 ug via INTRAVENOUS
  Administered 2019-10-03: 50 ug via INTRAVENOUS

## 2019-10-03 MED ORDER — ALBUMIN HUMAN 5 % IV SOLN
INTRAVENOUS | Status: AC
  Filled 2019-10-03: qty 750

## 2019-10-03 MED ORDER — DEXAMETHASONE SODIUM PHOSPHATE 10 MG/ML IJ SOLN
INTRAMUSCULAR | Status: AC
  Filled 2019-10-03: qty 1

## 2019-10-03 MED ORDER — DEXAMETHASONE SODIUM PHOSPHATE 10 MG/ML IJ SOLN
INTRAMUSCULAR | Status: DC | PRN
  Administered 2019-10-03: 10 mg via INTRAVENOUS

## 2019-10-03 MED ORDER — LACTATED RINGERS IV SOLN: INTRAVENOUS | Status: DC

## 2019-10-03 MED ORDER — PHENYLEPHRINE 40 MCG/ML (10ML) SYRINGE FOR IV PUSH (FOR BLOOD PRESSURE SUPPORT)
PREFILLED_SYRINGE | INTRAVENOUS | Status: DC | PRN
  Administered 2019-10-03 (×2): 120 ug via INTRAVENOUS

## 2019-10-03 MED ORDER — ACETAMINOPHEN 500 MG PO TABS
1000.0000 mg | ORAL_TABLET | Freq: Three times a day (TID) | ORAL | Status: DC
  Administered 2019-10-03: 1000 mg via ORAL
  Filled 2019-10-03: qty 2

## 2019-10-03 SURGICAL SUPPLY — 85 items
APL LAPSCP 35 DL APL RGD (MISCELLANEOUS) ×4
APL PRP STRL LF DISP 70% ISPRP (MISCELLANEOUS) ×2
APL SKNCLS STERI-STRIP NONHPOA (GAUZE/BANDAGES/DRESSINGS) ×2
APL SWBSTK 6 STRL LF DISP (MISCELLANEOUS)
APPLICATOR COTTON TIP 6 STRL (MISCELLANEOUS) IMPLANT
APPLICATOR COTTON TIP 6IN STRL (MISCELLANEOUS)
APPLICATOR VISTASEAL 35 (MISCELLANEOUS) ×6 IMPLANT
APPLIER CLIP ROT 13.4 12 LRG (CLIP) ×3
APR CLP LRG 13.4X12 ROT 20 MLT (CLIP) ×2
BENZOIN TINCTURE PRP APPL 2/3 (GAUZE/BANDAGES/DRESSINGS) ×3 IMPLANT
BLADE SURG SZ11 CARB STEEL (BLADE) ×3 IMPLANT
BNDG ADH 1X3 SHEER STRL LF (GAUZE/BANDAGES/DRESSINGS) ×13 IMPLANT
BNDG ADH THN 3X1 STRL LF (GAUZE/BANDAGES/DRESSINGS) ×2
CABLE HIGH FREQUENCY MONO STRZ (ELECTRODE) IMPLANT
CHLORAPREP W/TINT 26 (MISCELLANEOUS) ×5 IMPLANT
CLIP APPLIE ROT 13.4 12 LRG (CLIP) IMPLANT
CLIP SUT LAPRA TY ABSORB (SUTURE) ×6 IMPLANT
COVER WAND RF STERILE (DRAPES) IMPLANT
CUTTER FLEX LINEAR 45M (STAPLE) IMPLANT
DEVICE SUT QUICK LOAD TK 5 (STAPLE) ×2 IMPLANT
DEVICE SUT TI-KNOT TK 5X26 (MISCELLANEOUS) ×1 IMPLANT
DEVICE SUTURE ENDOST 10MM (ENDOMECHANICALS) ×3 IMPLANT
DEVICE TROCAR PUNCTURE CLOSURE (ENDOMECHANICALS) ×1 IMPLANT
DRAIN PENROSE 0.25X18 (DRAIN) ×3 IMPLANT
ELECT PENCIL ROCKER SW 15FT (MISCELLANEOUS) ×1 IMPLANT
ELECT REM PT RETURN 15FT ADLT (MISCELLANEOUS) ×3 IMPLANT
GAUZE 4X4 16PLY RFD (DISPOSABLE) ×3 IMPLANT
GAUZE SPONGE 4X4 12PLY STRL (GAUZE/BANDAGES/DRESSINGS) IMPLANT
GLOVE BIO SURGEON STRL SZ7.5 (GLOVE) IMPLANT
GLOVE INDICATOR 8.0 STRL GRN (GLOVE) ×3 IMPLANT
GOWN STRL REUS W/TWL XL LVL3 (GOWN DISPOSABLE) ×12 IMPLANT
HOVERMATT SINGLE USE (MISCELLANEOUS) ×3 IMPLANT
KIT BASIN (CUSTOM PROCEDURE TRAY) ×3 IMPLANT
KIT GASTRIC LAVAGE 34FR ADT (SET/KITS/TRAYS/PACK) ×3 IMPLANT
KIT TURNOVER KIT A (KITS) ×1 IMPLANT
MARKER SKIN DUAL TIP RULER LAB (MISCELLANEOUS) ×3 IMPLANT
NDL SPNL 22GX3.5 QUINCKE BK (NEEDLE) ×2 IMPLANT
NEEDLE SPNL 22GX3.5 QUINCKE BK (NEEDLE) ×3 IMPLANT
PACK CARDIOVASCULAR III (CUSTOM PROCEDURE TRAY) ×3 IMPLANT
PENCIL SMOKE EVACUATOR (MISCELLANEOUS) IMPLANT
RELOAD 45 VASCULAR/THIN (ENDOMECHANICALS) IMPLANT
RELOAD ENDO STITCH 2.0 (ENDOMECHANICALS) ×27
RELOAD STAPLE 45 2.5 WHT GRN (ENDOMECHANICALS) IMPLANT
RELOAD STAPLE 45 3.5 BLU ETS (ENDOMECHANICALS) IMPLANT
RELOAD STAPLE 60 2.6 WHT THN (STAPLE) ×4 IMPLANT
RELOAD STAPLE 60 3.6 BLU REG (STAPLE) ×4 IMPLANT
RELOAD STAPLE 60 3.8 GOLD REG (STAPLE) ×2 IMPLANT
RELOAD STAPLE TA45 3.5 REG BLU (ENDOMECHANICALS) IMPLANT
RELOAD STAPLER BLUE 60MM (STAPLE) ×12 IMPLANT
RELOAD STAPLER GOLD 60MM (STAPLE) ×2 IMPLANT
RELOAD STAPLER WHITE 60MM (STAPLE) ×4 IMPLANT
RELOAD SUT SNGL STCH ABSRB 2-0 (ENDOMECHANICALS) ×10 IMPLANT
RELOAD SUT SNGL STCH BLK 2-0 (ENDOMECHANICALS) ×8 IMPLANT
SCISSORS LAP 5X45 EPIX DISP (ENDOMECHANICALS) ×3 IMPLANT
SET IRRIG TUBING LAPAROSCOPIC (IRRIGATION / IRRIGATOR) ×3 IMPLANT
SET TUBE SMOKE EVAC HIGH FLOW (TUBING) ×3 IMPLANT
SHEARS HARMONIC ACE PLUS 45CM (MISCELLANEOUS) ×3 IMPLANT
SLEEVE XCEL OPT CAN 5 100 (ENDOMECHANICALS) ×3 IMPLANT
SOL ANTI FOG 6CC (MISCELLANEOUS) ×2 IMPLANT
SOLUTION ANTI FOG 6CC (MISCELLANEOUS) ×1
STAPLER ECHELON BIOABSB 60 FLE (MISCELLANEOUS) IMPLANT
STAPLER ECHELON LONG 60 440 (INSTRUMENTS) ×3 IMPLANT
STAPLER RELOAD BLUE 60MM (STAPLE) ×18
STAPLER RELOAD GOLD 60MM (STAPLE) ×3
STAPLER RELOAD WHITE 60MM (STAPLE) ×6
STRIP CLOSURE SKIN 1/2X4 (GAUZE/BANDAGES/DRESSINGS) ×3 IMPLANT
SURGILUBE 2OZ TUBE FLIPTOP (MISCELLANEOUS) ×3 IMPLANT
SUT MNCRL AB 4-0 PS2 18 (SUTURE) ×3 IMPLANT
SUT NOVA 1 T20/GS 25DT (SUTURE) ×1 IMPLANT
SUT RELOAD ENDO STITCH 2 48X1 (ENDOMECHANICALS) ×10
SUT RELOAD ENDO STITCH 2.0 (ENDOMECHANICALS) ×8
SUT SURGIDAC NAB ES-9 0 48 120 (SUTURE) ×2 IMPLANT
SUT VIC AB 2-0 SH 27 (SUTURE) ×3
SUT VIC AB 2-0 SH 27X BRD (SUTURE) ×2 IMPLANT
SUTURE RELOAD END STTCH 2 48X1 (ENDOMECHANICALS) ×10 IMPLANT
SUTURE RELOAD ENDO STITCH 2.0 (ENDOMECHANICALS) ×8 IMPLANT
SYR 20ML LL LF (SYRINGE) ×6 IMPLANT
TOWEL OR 17X26 10 PK STRL BLUE (TOWEL DISPOSABLE) ×3 IMPLANT
TOWEL OR NON WOVEN STRL DISP B (DISPOSABLE) ×3 IMPLANT
TRAY FOLEY MTR SLVR 16FR STAT (SET/KITS/TRAYS/PACK) IMPLANT
TROCAR BLADELESS OPT 5 100 (ENDOMECHANICALS) ×3 IMPLANT
TROCAR UNIVERSAL OPT 12M 100M (ENDOMECHANICALS) ×9 IMPLANT
TROCAR XCEL 12X100 BLDLESS (ENDOMECHANICALS) ×3 IMPLANT
TUBE CALIBRATION LAPBAND (TUBING) ×1 IMPLANT
TUBING CONNECTING 10 (TUBING) ×6 IMPLANT

## 2019-10-03 NOTE — Progress Notes (Signed)
Patient finished protein goal.

## 2019-10-03 NOTE — Interval H&P Note (Signed)
History and Physical Interval Note:  10/03/2019 7:08 AM  Steven Hodge  has presented today for surgery, with the diagnosis of Morbid Obesity.  The various methods of treatment have been discussed with the patient and family. After consideration of risks, benefits and other options for treatment, the patient has consented to  Procedure(s): LAPAROSCOPIC ROUX-EN-Y GASTRIC BYPASS WITH UPPER ENDOSCOPY, ERAS Pathway (N/A) as a surgical intervention.  The patient's history has been reviewed, patient examined, no change in status, stable for surgery.  I have reviewed the patient's chart and labs.  Questions were answered to the patient's satisfaction.    Leighton Ruff. Redmond Pulling, MD, FACS General, Bariatric, & Minimally Invasive Surgery [facility] [place]  Greer Pickerel

## 2019-10-03 NOTE — Transfer of Care (Signed)
Immediate Anesthesia Transfer of Care Note  Patient: Steven Hodge  Procedure(s) Performed: LAPAROSCOPIC ROUX-EN-Y GASTRIC BYPASS, HIATAL HERNIA REPARI, WITH UPPER ENDOSCOPY, ERAS Pathway (N/A Abdomen) HERNIA REPAIR UMBILICAL ADULT (Abdomen)  Patient Location: PACU  Anesthesia Type:General  Level of Consciousness: sedated  Airway & Oxygen Therapy: Patient Spontanous Breathing and Patient connected to face mask oxygen  Post-op Assessment: Report given to RN and Post -op Vital signs reviewed and stable  Post vital signs: Reviewed and stable  Last Vitals:  Vitals Value Taken Time  BP    Temp    Pulse 83 10/03/19 1050  Resp 14 10/03/19 1050  SpO2 100 % 10/03/19 1050  Vitals shown include unvalidated device data.  Last Pain:  Vitals:   10/03/19 0549  TempSrc: Oral  PainSc: 0-No pain         Complications: No apparent anesthesia complications

## 2019-10-03 NOTE — Anesthesia Procedure Notes (Signed)
Procedure Name: Intubation Date/Time: 10/03/2019 7:25 AM Performed by: Talbot Grumbling, CRNA Pre-anesthesia Checklist: Patient identified, Emergency Drugs available, Suction available and Patient being monitored Patient Re-evaluated:Patient Re-evaluated prior to induction Oxygen Delivery Method: Circle system utilized Preoxygenation: Pre-oxygenation with 100% oxygen Induction Type: IV induction Ventilation: Mask ventilation without difficulty Laryngoscope Size: Mac and 3 Grade View: Grade I Tube type: Oral Tube size: 8.0 mm Number of attempts: 1 Airway Equipment and Method: Stylet Placement Confirmation: ETT inserted through vocal cords under direct vision,  positive ETCO2 and breath sounds checked- equal and bilateral Secured at: 23 cm Tube secured with: Tape Dental Injury: Teeth and Oropharynx as per pre-operative assessment

## 2019-10-03 NOTE — Op Note (Signed)
Preoperative diagnosis: Roux-en-Y gastric bypass  Postoperative diagnosis: Same   Procedure: Upper endoscopy   Surgeon: Clovis Riley, M.D.  Anesthesia: Gen.   Description of procedure: The endoscopy was placed in the mouth and into the oropharynx and under endoscopic vision it was advanced to the esophagogastric junction which was identified at 38cm from the teeth.  With the roux limb occluded, the pouch was tensely insufflated while the upper abdomen was flooded with irrigation to perform a leak test, which was negative. No bubbles were seen.  The staple line and anastomosis were hemostatic and the anastomosis, identified at 43cm was visibly patent. The lumen was decompressed and the scope was withdrawn without difficulty.    Clovis Riley, M.D. General, Bariatric, & Minimally Invasive Surgery [facility] [place]

## 2019-10-03 NOTE — Discharge Instructions (Signed)
° ° ° °GASTRIC BYPASS/SLEEVE ° Home Care Instructions ° ° These instructions are to help you care for yourself when you go home. ° °Call: If you have any problems. °• Call [phone] and ask for the surgeon on call °• If you need immediate help, come to the ER at [facility].  °• Tell the ER staff that you are a new post-op gastric bypass or gastric sleeve patient °  °Signs and symptoms to report: • Severe vomiting or nausea °o If you cannot keep down clear liquids for longer than 1 day, call your surgeon  °• Abdominal pain that does not get better after taking your pain medication °• Fever over 100.4° F with chills °• Heart beating over 100 beats a minute °• Shortness of breath at rest °• Chest pain °•  Redness, swelling, drainage, or foul odor at incision (surgical) sites °•  If your incisions open or pull apart °• Swelling or pain in calf (lower leg) °• Diarrhea (Loose bowel movements that happen often), frequent watery, uncontrolled bowel movements °• Constipation, (no bowel movements for 3 days) if this happens: Pick one °o Milk of Magnesia, 2 tablespoons by mouth, 3 times a day for 2 days if needed °o Stop taking Milk of Magnesia once you have a bowel movement °o Call your doctor if constipation continues °Or °o Miralax  (instead of Milk of Magnesia) following the label instructions °o Stop taking Miralax once you have a bowel movement °o Call your doctor if constipation continues °• Anything you think is not normal °  °Normal side effects after surgery: • Unable to sleep at night or unable to focus °• Irritability or moody °• Being tearful (crying) or depressed °These are common complaints, possibly related to your anesthesia medications that put you to sleep, stress of surgery, and change in lifestyle.  This usually goes away a few weeks after surgery.  If these feelings continue, call your primary care doctor. °  °Wound Care: You may have surgical glue, steri-strips, or staples over your incisions after  surgery °• Surgical glue:  Looks like a clear film over your incisions and will wear off a little at a time °• Steri-strips: Strips of tape over your incisions. You may notice a yellowish color on the skin under the steri-strips. This is used to make the   steri-strips stick better. Do not pull the steri-strips off - let them fall off °• Staples: Staples may be removed before you leave the hospital °o If you go home with staples, call [facility], [phone] at for an appointment with your surgeon’s nurse to have staples removed 10 days after surgery. °• Showering: You may shower two (2) days after your surgery unless your surgeon tells you differently °o Wash gently around incisions with warm soapy water, rinse well, and gently pat dry  °o No tub baths until staples are removed, steri-strips fall off or glue is gone.  °  °Medications: • Medications should be liquid or crushed if larger than the size of a dime °• Extended release pills (medication that release a little bit at a time through the day) should NOT be crushed or cut. (examples include XL, ER, DR, SR) °• Depending on the size and number of medications you take, you may need to space (take a few throughout the day)/change the time you take your medications so that you do not over-fill your pouch (smaller stomach) °• Make sure you follow-up with your primary care doctor to   make medication changes needed during rapid weight loss and life-style changes °• If you have diabetes, follow up with the doctor that orders your diabetes medication(s) within one week after surgery and check your blood sugar regularly. °• Do not drive while taking prescription pain medication  °• It is ok to take Tylenol by the bottle instructions with your pain medicine or instead of your pain medicine as needed.  DO NOT TAKE NSAIDS (EXAMPLES OF NSAIDS:  IBUPROFREN/ NAPROXEN)  °Diet:                    First 2 Weeks ° You will see the dietician t about two (2) weeks  after your surgery. The dietician will increase the types of foods you can eat if you are handling liquids well: °• If you have severe vomiting or nausea and cannot keep down clear liquids lasting longer than 1 day, call your surgeon @ ([phone]) °Protein Shake °• Drink at least 2 ounces of shake 5-6 times per day °• Each serving of protein shakes (usually 8 - 12 ounces) should have: °o 15 grams of protein  °o And no more than 5 grams of carbohydrate  °• Goal for protein each day: °o Men = 80 grams per day °o Women = 60 grams per day °• Protein powder may be added to fluids such as non-fat milk or Lactaid milk or unsweetened Soy/Almond milk (limit to 35 grams added protein powder per serving) ° °Hydration °• Slowly increase the amount of water and other clear liquids as tolerated (See Acceptable Fluids) °• Slowly increase the amount of protein shake as tolerated  °•  Sip fluids slowly and throughout the day.  Do not use straws. °• May use sugar substitutes in small amounts (no more than 6 - 8 packets per day; i.e. Splenda) ° °Fluid Goal °• The first goal is to drink at least 8 ounces of protein shake/drink per day (or as directed by the nutritionist); some examples of protein shakes are Syntrax Nectar, Adkins Advantage, EAS Edge HP, and Unjury. See handout from pre-op Bariatric Education Class: °o Slowly increase the amount of protein shake you drink as tolerated °o You may find it easier to slowly sip shakes throughout the day °o It is important to get your proteins in first °• Your fluid goal is to drink 64 - 100 ounces of fluid daily °o It may take a few weeks to build up to this °• 32 oz (or more) should be clear liquids  °And  °• 32 oz (or more) should be full liquids (see below for examples) °• Liquids should not contain sugar, caffeine, or carbonation ° °Clear Liquids: °• Water or Sugar-free flavored water (i.e. Fruit H2O, Propel) °• Decaffeinated coffee or tea (sugar-free) °• Crystal Lite, Wyler’s Lite,  Minute Maid Lite °• Sugar-free Jell-O °• Bouillon or broth °• Sugar-free Popsicle:   *Less than 20 calories each; Limit 1 per day ° °Full Liquids: °Protein Shakes/Drinks + 2 choices per day of other full liquids °• Full liquids must be: °o No More Than 15 grams of Carbs per serving  °o No More Than 3 grams of Fat per serving °• Strained low-fat cream soup (except Cream of Potato or Tomato) °• Non-Fat milk °• Fat-free Lactaid Milk °• Unsweetened Soy Or Unsweetened Almond Milk °• Low Sugar yogurt (Dannon Lite & Fit, Greek yogurt; Oikos Triple Zero; Chobani Simply 100; Yoplait 100 calorie Greek - No Fruit on the Bottom) ° °  °Vitamins   and Minerals • Start 1 day after surgery unless otherwise directed by your surgeon °• 2 Chewable Bariatric Specific Multivitamin / Multimineral Supplement with iron (Example: Bariatric Advantage Multi EA) °• Chewable Calcium with Vitamin D-3 °(Example: 3 Chewable Calcium Plus 600 with Vitamin D-3) °o Take 500 mg three (3) times a day for a total of 1500 mg each day °o Do not take all 3 doses of calcium at one time as it may cause constipation, and you can only absorb 500 mg  at a time  °o Do not mix multivitamins containing iron with calcium supplements; take 2 hours apart °• Menstruating women and those with a history of anemia (a blood disease that causes weakness) may need extra iron °o Talk with your doctor to see if you need more iron °• Do not stop taking or change any vitamins or minerals until you talk to your dietitian or surgeon °• Your Dietitian and/or surgeon must approve all vitamin and mineral supplements °  °Activity and Exercise: Limit your physical activity as instructed by your doctor.  It is important to continue walking at home.  During this time, use these guidelines: °• Do not lift anything greater than ten (10) pounds for at least two (2) weeks °• Do not go back to work or drive until your surgeon says you can °• You may have sex when you feel comfortable  °o It is  VERY important for male patients to use a reliable birth control method; fertility often increases after surgery  °o All hormonal birth control will be ineffective for 30 days after surgery due to medications given during surgery a barrier method must be used. °o Do not get pregnant for at least 18 months °• Start exercising as soon as your doctor tells you that you can °o Make sure your doctor approves any physical activity °• Start with a simple walking program °• Walk 5-15 minutes each day, 7 days per week.  °• Slowly increase until you are walking 30-45 minutes per day °Consider joining our BELT program. [phone] or email [email or web address] °  °Special Instructions Things to remember: °• Use your CPAP when sleeping if this applies to you ° °• [facility] has two free Bariatric Surgery Support Groups that meet monthly °o The 3rd Thursday of each month, 6 pm, [facility]  °o The 2nd Friday of each month, 11:45 am in the private dining room in the basement of [facility] °• It is very important to keep all follow up appointments with your surgeon, dietitian, primary care physician, and behavioral health practitioner °• Routine follow up schedule with your surgeon include appointments at 2-3 weeks, 6-8 weeks, 6 months, and 1 year at a minimum.  Your surgeon may request to see you more often.   °o After the first year, please follow up with your bariatric surgeon and dietitian at least once a year in order to maintain best weight loss results °[facility]: [phone] °[facility]: [phone] °Bariatric Nurse Coordinator: [phone] °  °   Reviewed and Endorsed  °by [facility] Patient Education Committee, June, 2016 °Edits Approved: Aug, 2018 ° ° ° °

## 2019-10-03 NOTE — Progress Notes (Signed)
Protein started. Patient up and walking around unit 3 times since surgery.

## 2019-10-03 NOTE — Progress Notes (Signed)
NUTRITION NOTE  Consult received for Bariatric Diet teaching per DROP protocol. Patient is POD #0 lap Roux-en-Y gastric bypass with hiatal hernia repair, upper endoscopy, and lap primary repair of umbilical hernia.   Bariatric Nurse Educator to provide all post-op/pre-discharge information, including diet-related information. If additional nutrition-related needs arise, please re-consult RD.      Jarome Matin, MS, RD, LDN, CNSC Inpatient Clinical Dietitian RD pager # available in [facility]  After hours/weekend pager # available in [facility]

## 2019-10-03 NOTE — Progress Notes (Signed)
PHARMACY CONSULT FOR:  Risk Assessment for Post-Discharge VTE Following Bariatric Surgery  Post-Discharge VTE Risk Assessment: This patient's probability of 30-day post-discharge VTE is increased due to the factors marked: x  Male    Age >/=60 years    BMI >/=50 kg/m2    CHF    Dyspnea at Rest    Paraplegia  x  Non-gastric-band surgery    Operation Time >/=3 hr    Return to OR     Length of Stay >/= 3 d      Hx of VTE   Hypercoagulable condition   Significant venous stasis   Predicted probability of 30-day post-discharge VTE: 0.31%  Recommendation for Discharge: No pharmacologic prophylaxis post-discharge  Steven Hodge is a 59 y.o. 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  Recent Labs    10/03/19 1104  HGB 16.1  HCT 48.8   Estimated Creatinine Clearance: 119.1 mL/min (by C-G formula based on SCr of 0.87 mg/dL).    Past Medical History:  Diagnosis Date  . Arthritis    knees shoulders hands  . Back pain   . Cancer ([place]) 06/09/2006   Basal cell carcinoma scalp;   [name] Diabetes mellitus without complication ([place])   . Gout   . Hx of blood clots   . Hypertension      Medications Prior to Admission  Medication Sig Dispense Refill Last Dose  . allopurinol (ZYLOPRIM) 300 MG tablet Take 1 tablet (300 mg total) by mouth daily. 90 tablet 3 Past Week at Unknown time  . aspirin 325 MG tablet Take 325 mg by mouth daily.   Past Month at Unknown time  . canagliflozin (INVOKANA) 100 MG TABS tablet Take 1 tablet (100 mg total) by mouth daily before breakfast. 90 tablet 0 10/02/2019 at Unknown time  . Coenzyme Q10 (CO Q 10 PO) Take 300 mg by mouth daily.    Past Month at Unknown time  . fish oil-omega-3 fatty acids 1000 MG capsule Take 2 g by mouth daily.   Past Month  at Unknown time  . glucosamine-chondroitin 500-400 MG tablet Take 1 tablet by mouth daily.    Past Month at Unknown time  . lisinopril (ZESTRIL) 5 MG tablet Take 1 tablet (5 mg total) by mouth daily. 90 tablet 0 Past Week at Unknown time  . metFORMIN (GLUCOPHAGE) 500 MG tablet Take 1 tablet (500 mg total) by mouth 3 (three) times daily. (Patient taking differently: Take 500 mg by mouth 2 (two) times daily with a meal. ) 270 tablet 0 10/03/2019 at 0330  . Multiple Vitamin (MULTIVITAMIN) tablet Take 1 tablet by mouth daily.   Past Month at Unknown time  . TURMERIC PO Take 1,000 mg by mouth 2 (two) times daily.   Past Month at Unknown time  . Vitamin D, Ergocalciferol, (DRISDOL) 1.25 MG (50000 UNIT) CAPS capsule Take 1 capsule (50,000 Units total) by mouth every 7 (seven) days. 12 capsule 0 09/30/2019  . blood glucose meter kit and supplies Dispense based on patient and insurance preference. Use up to four times daily as directed. (FOR ICD-9 250.00, 250.01). 1 each 0   . gabapentin (NEURONTIN) 300 MG capsule Take 1 capsule (300 mg total) by mouth 3 (three) times daily. (Patient not taking: Reported  on 09/21/2019) 30 capsule 3 Not Taking at Unknown time  . glucose blood (CONTOUR NEXT TEST) test strip Use as instructed 100 strip 0   . MITIGARE 0.6 MG CAPS Take 2 capsule once, then 1 pill an hour later for gout (Patient not taking: Reported on 09/21/2019) 30 capsule 0 Not Taking at Unknown time  . polyethylene glycol powder (GLYCOLAX/MIRALAX) powder Take 17 g by mouth daily. (Patient not taking: Reported on 09/21/2019) 3350 g 0 Not Taking at Unknown time     Lenis Noon, PharmD 10/03/2019,1:26 PM

## 2019-10-03 NOTE — Progress Notes (Signed)
Water started. Patient has c/o pain 8/10. Oxycodone given with scheduled tylenol. Patient demonstrated IS use. No complaints of nausea. Patient verbalized he will walk after resting some.

## 2019-10-03 NOTE — Progress Notes (Signed)
Discussed post op day goals with patient including ambulation, IS, diet progression, pain, and nausea control.  BSTOP education provided including BSTOP information guide, "Guide for Pain Management after your Bariatric Procedure". Ice pack applied to umbilicus area. Questions answered.

## 2019-10-03 NOTE — Anesthesia Postprocedure Evaluation (Signed)
Anesthesia Post Note  Patient: Steven Hodge  Procedure(s) Performed: LAPAROSCOPIC ROUX-EN-Y GASTRIC BYPASS, HIATAL HERNIA REPARI, WITH UPPER ENDOSCOPY, ERAS Pathway (N/A Abdomen) HERNIA REPAIR UMBILICAL ADULT (Abdomen)     Patient location during evaluation: PACU Anesthesia Type: General Level of consciousness: awake and alert Pain management: pain level controlled Vital Signs Assessment: post-procedure vital signs reviewed and stable Respiratory status: spontaneous breathing, nonlabored ventilation, respiratory function stable and patient connected to nasal cannula oxygen Cardiovascular status: blood pressure returned to baseline and stable Postop Assessment: no apparent nausea or vomiting Anesthetic complications: no    Last Vitals:  Vitals:   10/03/19 1237 10/03/19 1309  BP: (!) 156/88 (!) 150/86  Pulse: 82 80  Resp: 14 16  Temp: 37 C 36.9 C  SpO2: 93% 92%    Last Pain:  Vitals:   10/03/19 1309  TempSrc: Oral  PainSc:                  [name]

## 2019-10-03 NOTE — Op Note (Addendum)
SHEEL PANTHER [id number] 1960/12/02. 10/03/2019  Preoperative diagnosis:    Severe obesity BMI 40   Essential hypertension   Idiopathic chronic gout of multiple sites with tophus   Type 2 diabetes mellitus with hyperglycemia, without long-term current use of insulin (HCC)   Other hyperlipidemia   Fatty liver with elevated transaminases Umbilical hernia  Postoperative  diagnosis:  1. Same + small sliding hiatal hernia  Surgical procedure: Laparoscopic Roux-en-Y gastric bypass (ante-colic, ante-gastric) with hiatal hernia repair; upper endoscopy; laparoscopic primary repair of umbilical hernia  Surgeon: Gayland Curry, M.D. FACS  Asst.: Romana Juniper MD FACS (an assistant was needed and requested to help retract & manipulate tissue given the complexity of the case as well as the amount of visceral adipose tissue within the abdomen)  Anesthesia: General plus exparel/marcaine mix  Complications: None   EBL: Minimal   Drains: None   Disposition: PACU in good condition   Indications for procedure: 59 y.o. yo male with morbid obesity who has been unsuccessful at sustained weight loss. The patient's comorbidities are listed above. We discussed the risk and benefits of surgery including but not limited to anesthesia risk, bleeding, infection, blood clot formation, anastomotic leak, anastomotic stricture, ulcer formation, death, respiratory complications, intestinal blockage, internal hernia, gallstone formation, vitamin and nutritional deficiencies, injury to surrounding structures, failure to lose weight and mood changes.   Description of procedure: Patient is brought to the operating room and general anesthesia induced. The patient had received preoperative broad-spectrum IV antibiotics and subcutaneous heparin. The abdomen was widely sterilely prepped with Chloraprep and draped. Patient timeout was performed and correct patient and procedure confirmed. Access was obtained with a 12 mm  Optiview trocar in the left upper quadrant and pneumoperitoneum established without difficulty. Under direct vision 12 mm trocars were placed laterally in the right upper quadrant, right upper quadrant midclavicular line, and to the left and above the umbilicus for the camera port. A 5 mm trocar was placed laterally in the left upper quadrant.  Exparel/marcaine mix was infiltrated in bilateral lateral abdominal walls as a TAP block.  The omentum was brought into the upper abdomen and the transverse mesocolon elevated and the ligament of Treitz clearly identified. A 40 cm biliopancreatic limb was then carefully measured from the ligament of Treitz. The small intestine was divided at this point with a single firing of the white load linear stapler. A Penrose drain was sutured to the end of the Roux-en-Y limb for later identification. A 100 cm Roux-en-Y limb was then carefully measured. At this point a side-to-side anastomosis was created between the Roux limb and the end of the biliopancreatic limb. This was accomplished with a single firing of the 60 mm white load linear stapler with my assistant retracting and holding the small bowel for proper alignment.. The common enterotomy was closed with a running 2-0 Vicryl begun at either end of the enterotomy and tied centrally. Eviceal tissue sealant was placed over the anastomosis.  My assistant held up on the jejunojejunostomy while I closed the mesenteric defect  with running 2-0 silk. The omentum was then divided with the harmonic scalpel up towards the transverse colon to allow mobility of the Roux limb toward the gastric pouch. The patient was then placed in steep reversed Trendelenburg. Through a 5 mm subxiphoid site the [facility] retractor was placed and the left lobe of the liver elevated with excellent exposure of the upper stomach and hiatus.  The liver appeared fatty.  Given the amount of  visceral adipose tissue there was not a lot of working room however we  felt it was safe to proceed.  His preop GI suggested a small sliding hiatal hernia.  A balloon calibration tube was placed in the oropharynx and down into the stomach.  It was then inflated with 10 cc of air and gently retracted it slid up out of the stomach above the diaphragm.  This suggested a probable small sliding hiatal hernia.  We incised the gastrohepatic ligament with harmonic scalpel.  Patient had a fair amount of perigastric fat tissue.  The right diaphragmatic crus was identified.  My assistant retracted on the stomach and the perigastric fat while I incised along the medial edge of the right crus the diaphragm with harmonic scalpel.  We then bluntly dissected and identified the left crus of the diaphragm.  I was able to reduce some of the perigastric fat tissue.  There appeared to be some chronic inflammation in this area probably due to GERD.  I then reapproximated the left and right crura with 2 interrupted 0 Ethibond sutures using the Endo Stitch each secured with a titanium tie knot.  We then readvanced the calibration tube back down into the stomach and had the anesthetist inflate the tip with 10 cc of air.  The tube was then gently pulled back.  The tube stayed below the diaphragm.  Therefore we felt we had achieved adequate crural reapproximation.  The balloon was deflated and the tube was withdrawn.   The angle of Hiss was then mobilized with the harmonic scalpel. A 5 cm gastric pouch was then carefully measured along the lesser curve of the stomach. Dissection was carried along the lesser curve at this point with the Harmonic scalpel working carefully back toward the lesser sac at right angles to the lesser curve. The free lesser sac was then entered some difficulty.  And there was a large amount of perigastric fat tissue. After being sure all tubes were removed from the stomach an initial firing of the gold load 60 mm linear stapler was fired at right angles across the lesser curve for about  4 cm. The gastric pouch was further mobilized posteriorly and then the pouch was completed with 4 further firings of the 60 mm blue load linear stapler up through the previously dissected angle of His. It was ensured that the pouch was completely mobilized away from the gastric remnant. This created a nice tubular 4-5 cm gastric pouch. The Roux limb was then brought up in an antecolic fashion with the candycane facing to the patient's left without undue tension. The gastrojejunostomy was created with an initial posterior row of 2-0 Vicryl between the Roux limb and the staple line of the gastric pouch. Enterotomies were then made in the gastric pouch and the Roux limb with the harmonic scalpel and at approximately 2-2-1/2 cm anastomosis was created with a single firing of the 26mm blue load linear stapler. The staple line was inspected and was intact without bleeding. The common enterotomy was then closed with running 2-0 Vicryl begun at either end and tied centrally. The Ewall tube was then easily passed through the anastomosis and an outer anterior layer of running 2-0 Vicryl was placed.  There was a little bit of bleeding from where I placed the last suture.  A clip was placed for hemostasis.  The Ewald tube was removed. With the outlet of the gastrojejunostomy clamped and under saline irrigation the assistant performed upper endoscopy and with the gastric  pouch tensely distended with air-there was no evidence of leak on this test. The pouch was desufflated. The Terance Hart defect was closed with running 2-0 silk. The abdomen was inspected for any evidence of bleeding or bowel injury and everything looked fine. The Nathanson retractor was removed under direct vision after coating the anastomosis with Eviceal tissue sealant.  I then turned my attention to his known small umbilical hernia.  He complained about tenderness at this area preoperatively and requested repair during his surgery.  He had a plug of  preperitoneal fat filling the fascial defect.  It was reduced with a blunt grasper and then hemostasis was achieved with harmonic scalpel.  This revealed a less than 1 cm fascial defect.  It was closed with 2 interrupted 1-0 Novafil sutures using an Endo Close device.  There was no palpable defect after this.  Exparel Marcaine mixture was infiltrated in the area.  A Steri-Strip was placed over the small stab incision just in the infraumbilical position which had been made to accommodate placement of the 2 interrupted Novafil sutures.   All CO2 was evacuated and trochars removed. Skin incisions were closed with 4-0 monocryl in a subcuticular fashion followed by benzoin, steri-strips and bandages. Sponge needle and instrument counts were correct. The patient was taken to the PACU in good condition.    Leighton Ruff. Redmond Pulling, MD, FACS General, Bariatric, & Minimally Invasive Surgery [facility], [place]

## 2019-10-04 LAB — CBC WITH DIFFERENTIAL/PLATELET
Abs Immature Granulocytes: 0.03 10*3/uL (ref 0.00–0.07)
Basophils Absolute: 0 10*3/uL (ref 0.0–0.1)
Basophils Relative: 0 %
Eosinophils Absolute: 0 10*3/uL (ref 0.0–0.5)
Eosinophils Relative: 0 %
HCT: 45.1 % (ref 39.0–52.0)
Hemoglobin: 14.9 g/dL (ref 13.0–17.0)
Immature Granulocytes: 0 %
Lymphocytes Relative: 18 %
Lymphs Abs: 2.1 10*3/uL (ref 0.7–4.0)
MCH: 30.6 pg (ref 26.0–34.0)
MCHC: 33 g/dL (ref 30.0–36.0)
MCV: 92.6 fL (ref 80.0–100.0)
Monocytes Absolute: 1.3 10*3/uL — ABNORMAL HIGH (ref 0.1–1.0)
Monocytes Relative: 11 %
Neutro Abs: 8.4 10*3/uL — ABNORMAL HIGH (ref 1.7–7.7)
Neutrophils Relative %: 71 %
Platelets: 176 10*3/uL (ref 150–400)
RBC: 4.87 MIL/uL (ref 4.22–5.81)
RDW: 13.3 % (ref 11.5–15.5)
WBC: 11.9 10*3/uL — ABNORMAL HIGH (ref 4.0–10.5)
nRBC: 0 % (ref 0.0–0.2)

## 2019-10-04 LAB — COMPREHENSIVE METABOLIC PANEL
ALT: 104 U/L — ABNORMAL HIGH (ref 0–44)
AST: 69 U/L — ABNORMAL HIGH (ref 15–41)
Albumin: 3.7 g/dL (ref 3.5–5.0)
Alkaline Phosphatase: 51 U/L (ref 38–126)
Anion gap: 7 (ref 5–15)
BUN: 20 mg/dL (ref 6–20)
CO2: 27 mmol/L (ref 22–32)
Calcium: 8.6 mg/dL — ABNORMAL LOW (ref 8.9–10.3)
Chloride: 103 mmol/L (ref 98–111)
Creatinine, Ser: 0.9 mg/dL (ref 0.61–1.24)
GFR calc Af Amer: >60 mL/min (ref >60)
GFR calc non Af Amer: >60 mL/min (ref >60)
Glucose, Bld: 140 mg/dL — ABNORMAL HIGH (ref 70–99)
Potassium: 4.8 mmol/L (ref 3.5–5.1)
Sodium: 137 mmol/L (ref 135–145)
Total Bilirubin: 1.1 mg/dL (ref 0.3–1.2)
Total Protein: 6.7 g/dL (ref 6.5–8.1)

## 2019-10-04 LAB — GLUCOSE, CAPILLARY
Glucose-Capillary: 114 mg/dL — ABNORMAL HIGH (ref 70–99)
Glucose-Capillary: 142 mg/dL — ABNORMAL HIGH (ref 70–99)
Glucose-Capillary: 155 mg/dL — ABNORMAL HIGH (ref 70–99)
Glucose-Capillary: 164 mg/dL — ABNORMAL HIGH (ref 70–99)

## 2019-10-04 MED ORDER — GABAPENTIN 100 MG PO CAPS
200.0000 mg | ORAL_CAPSULE | Freq: Two times a day (BID) | ORAL | 0 refills | Status: DC
Start: 2019-10-04 — End: 2019-10-17

## 2019-10-04 MED ORDER — ONDANSETRON 4 MG PO TBDP
4.0000 mg | ORAL_TABLET | Freq: Four times a day (QID) | ORAL | 0 refills | Status: DC | PRN
Start: 2019-10-04 — End: 2019-12-01

## 2019-10-04 MED ORDER — PANTOPRAZOLE SODIUM 40 MG PO TBEC
40.0000 mg | DELAYED_RELEASE_TABLET | Freq: Every day | ORAL | 0 refills | Status: DC
Start: 2019-10-04 — End: 2020-03-15

## 2019-10-04 MED ORDER — ACETAMINOPHEN 500 MG PO TABS
1000.0000 mg | ORAL_TABLET | Freq: Three times a day (TID) | ORAL | 0 refills | Status: AC
Start: 2019-10-04 — End: 2019-10-09

## 2019-10-04 MED ORDER — OXYCODONE HCL 5 MG PO TABS
5.0000 mg | ORAL_TABLET | Freq: Four times a day (QID) | ORAL | 0 refills | Status: DC | PRN
Start: 2019-10-04 — End: 2019-10-17

## 2019-10-04 NOTE — Progress Notes (Signed)
Patient alert and oriented, Post op day 1.  Provided support and encouragement.  Encouraged pulmonary toilet, ambulation and small sips of liquids. Completed 12 ounces of bari clear fluid and protein.  Only complaint is pain at umbilicus.  Medication and ice help with pain.   All questions answered.  Will continue to monitor.

## 2019-10-04 NOTE — Progress Notes (Signed)
Patient alert and oriented, pain is controlled. Patient is tolerating fluids, advanced to protein shake today, patient is tolerating well. Reviewed Gastric Bypass discharge instructions with patient and patient is able to articulate understanding. Provided information on BELT program, Support Group and [facility] outpatient pharmacy. All questions answered, will continue to monitor.   Total fluid intake 1650 Per dehydration protocol call back one week postop

## 2019-10-04 NOTE — Discharge Summary (Signed)
Physician Discharge Summary  Steven Hodge [id number] DOB: 18-Nov-1960 DOA: 10/03/2019  PCP: Darreld Mclean, MD  Admit date: 10/03/2019 Discharge date: 10/04/2019  Recommendations for Outpatient Follow-up:    Follow-up Information    Greer Pickerel, MD. Go on 10/19/2019.   Specialty: General Surgery Why: at 930 am.  Pleasea arrive 15 minutes prior to appointment time.  Thank you Contact information: [address] [place] [address] [phone]        Carlena Hurl, PA-C. Go on 11/15/2019.   Specialty: General Surgery Why: at 145 pm.  Please arrive 15 minutes prior to appointment time.  Thank you Contact information: [place] [address] [phone]          Discharge Diagnoses:  Principal Problem:   Severe obesity ([place]) Active Problems:   Essential hypertension   Idiopathic chronic gout of multiple sites with tophus   Type 2 diabetes mellitus with hyperglycemia, without long-term current use of insulin ([place])   Other hyperlipidemia   Fatty liver   Surgical Procedure: Laparoscopic Roux-en-Y gastric bypass, upper endoscopy  Discharge Condition: Good Disposition: Home  Diet recommendation: Postoperative gastric bypass diet  Filed Weights   10/03/19 1237  Weight: 119.4 kg     Hospital Course:  The patient was admitted for a planned laparoscopic Roux-en-Y gastric bypass. Please see operative note. Preoperatively the patient was given 5000 units of subcutaneous heparin for DVT prophylaxis. ERAS protocol was used. Postoperative prophylactic Lovenox dosing was started on the evening of postoperative day 0.  The patient was started on ice chips and water on the evening of POD 0 which they tolerated. On postoperative day 1 The patient's diet was advanced to protein shakes which they also tolerated. On POD 1, The patient was ambulating without difficulty. Their vital signs are stable without fever or tachycardia. Their hemoglobin  had remained stable.  The patient had received discharge instructions and counseling. They were deemed stable for discharge. He did have some mild blanching erythema across his entire abdomen when chloraprep had been placed. He had no itching. No rash. I advised him and his wife to continue to monitor at home but I didn't think he needed to stay again just to monitor that since he had no fever, no tachycardia, etc.   BP (!) 148/78 (BP Location: Left Arm)   Pulse 89   Temp 98.4 F (36.9 C)   Resp 15   Ht '5\' 8"'  (1.727 m)   Wt 119.4 kg   SpO2 96%   BMI 40.02 kg/m   Gen: alert, NAD, non-toxic appearing Pupils: equal, no scleral icterus Pulm: Lungs clear to auscultation, symmetric chest rise CV: regular rate and rhythm Abd: soft, min tender, nondistended. No cellulitis. No incisional hernia. Mild blanching erythema across entire abdominal wall Ext: no edema, no calf tenderness Skin: no rash, no jaundice  Discharge Instructions  Discharge Instructions    Ambulate hourly while awake   Complete by: As directed    Call MD for:  difficulty breathing, headache or visual disturbances   Complete by: As directed    Call MD for:  persistant dizziness or light-headedness   Complete by: As directed    Call MD for:  persistant nausea and vomiting   Complete by: As directed    Call MD for:  redness, tenderness, or signs of infection (pain, swelling, redness, odor or green/yellow discharge around incision site)   Complete by: As directed    Call MD  for:  severe uncontrolled pain   Complete by: As directed    Call MD for:  temperature >101 F   Complete by: As directed    Diet bariatric full liquid   Complete by: As directed    Discharge instructions   Complete by: As directed    See bariatric discharge instructions   Incentive spirometry   Complete by: As directed    Perform hourly while awake     Allergies as of 10/04/2019   No Known Allergies     Medication List    STOP taking these  medications   aspirin 325 MG tablet   canagliflozin 100 MG Tabs tablet Commonly known as: Invokana   metFORMIN 500 MG tablet Commonly known as: GLUCOPHAGE   Mitigare 0.6 MG Caps Generic drug: Colchicine     TAKE these medications   acetaminophen 500 MG tablet Commonly known as: TYLENOL Take 2 tablets (1,000 mg total) by mouth every 8 (eight) hours for 5 days.   allopurinol 300 MG tablet Commonly known as: ZYLOPRIM Take 1 tablet (300 mg total) by mouth daily.   blood glucose meter kit and supplies Dispense based on patient and insurance preference. Use up to four times daily as directed. (FOR ICD-9 250.00, 250.01).   CO Q 10 PO Take 300 mg by mouth daily.   Contour Next Test test strip Generic drug: glucose blood Use as instructed   fish oil-omega-3 fatty acids 1000 MG capsule Take 2 g by mouth daily.   gabapentin 100 MG capsule Commonly known as: NEURONTIN Take 2 capsules (200 mg total) by mouth every 12 (twelve) hours. What changed:   medication strength  how much to take  when to take this   glucosamine-chondroitin 500-400 MG tablet Take 1 tablet by mouth daily.   lisinopril 5 MG tablet Commonly known as: ZESTRIL Take 1 tablet (5 mg total) by mouth daily. Notes to patient: Monitor Blood Pressure Daily and keep a log for primary care physician.  You may need to make changes to your medications with rapid weight loss.     multivitamin tablet Take 1 tablet by mouth daily.   ondansetron 4 MG disintegrating tablet Commonly known as: ZOFRAN-ODT Take 1 tablet (4 mg total) by mouth every 6 (six) hours as needed for nausea or vomiting.   oxyCODONE 5 MG immediate release tablet Commonly known as: Oxy IR/ROXICODONE Take 1 tablet (5 mg total) by mouth every 6 (six) hours as needed for severe pain.   pantoprazole 40 MG tablet Commonly known as: PROTONIX Take 1 tablet (40 mg total) by mouth daily.   polyethylene glycol powder 17 GM/SCOOP powder Commonly known  as: GLYCOLAX/MIRALAX Take 17 g by mouth daily.   TURMERIC PO Take 1,000 mg by mouth 2 (two) times daily.   Vitamin D (Ergocalciferol) 1.25 MG (50000 UNIT) Caps capsule Commonly known as: DRISDOL Take 1 capsule (50,000 Units total) by mouth every 7 (seven) days.      Follow-up Information    Greer Pickerel, MD. Go on 10/19/2019.   Specialty: General Surgery Why: at 930 am.  Pleasea arrive 15 minutes prior to appointment time.  Thank you Contact information: [address] [place] [address] [phone]        Carlena Hurl, PA-C. Go on 11/15/2019.   Specialty: General Surgery Why: at 145 pm.  Please arrive 15 minutes prior to appointment time.  Thank you Contact information: [place] [address] [phone]  The results of significant diagnostics from this hospitalization (including imaging, microbiology, ancillary and laboratory) are listed below for reference.    Significant Diagnostic Studies: No results found.  Labs: Basic Metabolic Panel: Recent Labs  Lab 09/29/19 1309 10/04/19 0428  NA 141 137  K 4.4 4.8  CL 107 103  CO2 26 27  GLUCOSE 170* 140*  BUN 25* 20  CREATININE 0.87 0.90  CALCIUM 8.9 8.6*   Liver Function Tests: Recent Labs  Lab 09/29/19 1309 10/04/19 0428  AST 25 69*  ALT 47* 104*  ALKPHOS 61 51  BILITOT 0.2* 1.1  PROT 7.0 6.7  ALBUMIN 4.0 3.7    CBC: Recent Labs  Lab 09/29/19 1309 10/03/19 1104 10/04/19 0428  WBC 7.2  --  11.9*  NEUTROABS 4.3  --  8.4*  HGB 16.3 16.1 14.9  HCT 47.8 48.8 45.1  MCV 91.2  --  92.6  PLT 178  --  176    CBG: Recent Labs  Lab 10/03/19 2002 10/03/19 2359 10/04/19 0417 10/04/19 0727 10/04/19 1149  GLUCAP 160* 155* 114* 164* 142*    Principal Problem:   Severe obesity ([place]) Active Problems:   Essential hypertension   Idiopathic chronic gout of multiple sites with tophus   Type 2 diabetes mellitus with hyperglycemia, without long-term  current use of insulin ([place])   Other hyperlipidemia   Fatty liver   Time coordinating discharge: 15 min  Signed:  Gayland Curry, MD [facility], [place] [phone] 10/04/2019, 2:42 PM

## 2019-10-04 NOTE — Progress Notes (Signed)
NT walked Patient out to the front of the building. D/C instructions given to patient by Bariatric nurse. Patient had no questions.

## 2019-10-05 ENCOUNTER — Ambulatory Visit: Payer: BC Managed Care – PPO | Admitting: Family Medicine

## 2019-10-10 ENCOUNTER — Encounter: Payer: Self-pay | Admitting: *Deleted

## 2019-10-10 ENCOUNTER — Telehealth (HOSPITAL_COMMUNITY): Payer: Self-pay

## 2019-10-10 NOTE — Telephone Encounter (Signed)
Patient called to discuss post bariatric surgery follow up questions.  See below:   1.  Tell me about your pain and pain management?denies, belly button most uncomfortable no pain since last Thursday  2.  Let's talk about fluid intake.  How much total fluid are you taking in? 64 ounces  3.  How much protein have you taken in the last 2 days?75-80 grams  4.  Have you had nausea?  Tell me about when have experienced nausea and what you did to help?denies  5.  Has the frequency or color changed with your urine?urine clear not problems gets lighter as day goes on  6.  Tell me what your incisions look like?no problem  7.  Have you been passing gas? BM?one every day since Thursday after surgery  8.  If a problem or question were to arise who would you call?  Do you know contact numbers for [place], [facility], and [facility]?aware of how to contact all services  9.  How has the walking going?walking around at home and outside  10.  How are your vitamins and calcium going?  How are you taking them?mvi and calcium  Started no problems

## 2019-10-16 DIAGNOSIS — Z9884 Bariatric surgery status: Secondary | ICD-10-CM | POA: Insufficient documentation

## 2019-10-16 HISTORY — DX: Bariatric surgery status: Z98.84

## 2019-10-16 NOTE — Patient Instructions (Addendum)
It was great to see you again today!  We are so happy for you!   Please see me next month for a recheck and labs   Please get your covid 19 vaccine asap

## 2019-10-16 NOTE — Progress Notes (Signed)
Steven Hodge at [facility] [address] [place], [place] [address] [phone]  Date:  10/17/2019   Name:  Steven Hodge   DOB:  1961-05-03   MRN:  [id number]  PCP:  Darreld Mclean, MD    Chief Complaint: Diabetes   History of Present Illness:  Steven Hodge is a 59 y.o. very pleasant male patient who presents with the following:  Here today for follow-up visit.  Last seen by myself in February of this year He underwent gastric bypass surgery on April 26 and had his umbilical hernia repair at the same time He notes that his surgery went really well  History of diabetes, hypertension, hyperlipidemia, gout  Lab Results  Component Value Date   HGBA1C 7.6 (H) 09/29/2019   COVID-19 vaccine- he plans to do this as soon as he is able  Foot exam due  He is using protein shakes, diet as prescribed for his post- surgery and following this ok He is going to the nutrition center tomorrow to discuss his progress  He has not had any vomiting or pain His bowels are generally ok- having 1-2 B per day   Wt Readings from Last 3 Encounters:  10/17/19 246 lb (111.6 kg)  10/03/19 263 lb 3.7 oz (119.4 kg)  09/29/19 263 lb 4 oz (119.4 kg)    Patient Active Problem List   Diagnosis Date Noted  . H/O gastric bypass 10/16/2019  . Severe obesity ([place]) 10/03/2019  . Fatty liver 07/21/2019  . Other hyperlipidemia 05/07/2019  . Type 2 diabetes mellitus with hyperglycemia, without long-term current use of insulin ([place]) 07/06/2017  . 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  . Vitamin D deficiency 11/18/2016  . Essential hypertension 09/18/2016  . History of colonic polyps 08/20/2016    Past Medical History:  Diagnosis Date  . Arthritis    knees shoulders hands  . Back pain   . Cancer ([place]) 06/09/2006   Basal cell carcinoma scalp;   [name] Diabetes mellitus without complication ([place])   . Gout   . Hx of blood clots    . Hypertension     Past Surgical History:  Procedure Laterality Date  . basel cell cancer removed  2009  . GASTRIC ROUX-EN-Y N/A 10/03/2019   Procedure: LAPAROSCOPIC ROUX-EN-Y GASTRIC BYPASS, HIATAL HERNIA REPARI, WITH UPPER ENDOSCOPY, ERAS Pathway;  Surgeon: Greer Pickerel, MD;  Location: [facility];  Service: General;  Laterality: N/A;  . KNEE SURGERY  2005   ACL repair R  . UMBILICAL HERNIA REPAIR  10/03/2019   Procedure: HERNIA REPAIR UMBILICAL ADULT;  Surgeon: Greer Pickerel, MD;  Location: [facility];  Service: General;;    Social History   Tobacco Use  . Smoking status: Former Research scientist (life sciences)  . Smokeless tobacco: Never Used  Substance Use Topics  . Alcohol use: No  . Drug use: No    Family History  Problem Relation Age of Onset  . Diabetes Mother   . Liver disease Mother   . Obesity Mother   . Cancer Father 40       lung cancer  . Diabetes Father   . Cancer Sister 79       lung cancer  . Heart disease Brother 67       cardiac stenting/CAD  . Cancer Brother        skin cancer  . Arthritis Brother     No Known Allergies  Medication  list has been reviewed and updated.  Current Outpatient Medications on File Prior to Visit  Medication Sig Dispense Refill  . allopurinol (ZYLOPRIM) 300 MG tablet Take 1 tablet (300 mg total) by mouth daily. 90 tablet 3  . lisinopril (ZESTRIL) 5 MG tablet Take 1 tablet (5 mg total) by mouth daily. 90 tablet 0  . pantoprazole (PROTONIX) 40 MG tablet Take 1 tablet (40 mg total) by mouth daily. 90 tablet 0  . blood glucose meter kit and supplies Dispense based on patient and insurance preference. Use up to four times daily as directed. (FOR ICD-9 250.00, 250.01). (Patient not taking: Reported on 10/17/2019) 1 each 0  . Coenzyme Q10 (CO Q 10 PO) Take 300 mg by mouth daily.     . fish oil-omega-3 fatty acids 1000 MG capsule Take 2 g by mouth daily.    . glucosamine-chondroitin 500-400 MG tablet Take 1 tablet by mouth daily.     . glucose blood (CONTOUR  NEXT TEST) test strip Use as instructed (Patient not taking: Reported on 10/17/2019) 100 strip 0  . Multiple Vitamin (MULTIVITAMIN) tablet Take 1 tablet by mouth daily.    . ondansetron (ZOFRAN-ODT) 4 MG disintegrating tablet Take 1 tablet (4 mg total) by mouth every 6 (six) hours as needed for nausea or vomiting. (Patient not taking: Reported on 10/17/2019) 20 tablet 0  . polyethylene glycol powder (GLYCOLAX/MIRALAX) powder Take 17 g by mouth daily. (Patient not taking: Reported on 09/21/2019) 3350 g 0  . TURMERIC PO Take 1,000 mg by mouth 2 (two) times daily.    . Vitamin D, Ergocalciferol, (DRISDOL) 1.25 MG (50000 UNIT) CAPS capsule Take 1 capsule (50,000 Units total) by mouth every 7 (seven) days. (Patient not taking: Reported on 10/17/2019) 12 capsule 0   No current facility-administered medications on file prior to visit.    Review of Systems:  As per HPI- otherwise negative.   Physical Examination: Vitals:   10/17/19 0818  BP: 117/74  Pulse: 66  Resp: 17  Temp: 98.3 F (36.8 C)  SpO2: 97%   Vitals:   10/17/19 0818  Weight: 246 lb (111.6 kg)  Height: 5' 8" (1.727 m)   Body mass index is 37.4 kg/m. Ideal Body Weight: Weight in (lb) to have BMI = 25: 164.1  GEN: no acute distress. Obese but has lost weight  HEENT: Atraumatic, Normocephalic.   PEERL, TM wnl bilaterally  Ears and Nose: No external deformity. CV: RRR, No M/G/R. No JVD. No thrill. No extra heart sounds. PULM: CTA B, no wheezes, crackles, rhonchi. No retractions. No resp. distress. No accessory muscle use. ABD: S, NT, ND EXTR: No c/c/e PSYCH: Normally interactive. Conversant.  Foot exam- normal   Assessment and Plan: Type 2 diabetes mellitus with hyperglycemia, without long-term current use of insulin (HCC)  Essential hypertension  Other hyperlipidemia  H/O gastric bypass  Following up today after gastric bypass.  He is overall doing great so far.  No longer on any DM medications, he brings in a list  of recent glucose readings today, all under 150 continue lisinopril 5 mg for now for renal protection He will see me in 6 weeks for our next visit and labs  He will contact me if any concerns in the meantime Encouraged him to get covid 19 series asap  This visit occurred during the SARS-CoV-2 public health emergency.  Safety protocols were in place, including screening questions prior to the visit, additional usage of staff PPE, and extensive cleaning of exam   room while observing appropriate contact time as indicated for disinfecting solutions.   Moderate medical decision making today Signed Jessica Copland, MD  

## 2019-10-17 ENCOUNTER — Other Ambulatory Visit: Payer: Self-pay

## 2019-10-17 ENCOUNTER — Encounter: Payer: Self-pay | Admitting: Family Medicine

## 2019-10-17 ENCOUNTER — Ambulatory Visit: Payer: BC Managed Care – PPO | Admitting: Family Medicine

## 2019-10-17 VITALS — BP 117/74 | HR 66 | Temp 98.3°F | Resp 17 | Ht 68.0 in | Wt 246.0 lb

## 2019-10-17 DIAGNOSIS — I10 Essential (primary) hypertension: Secondary | ICD-10-CM | POA: Diagnosis not present

## 2019-10-17 DIAGNOSIS — E1165 Type 2 diabetes mellitus with hyperglycemia: Secondary | ICD-10-CM | POA: Diagnosis not present

## 2019-10-17 DIAGNOSIS — Z9884 Bariatric surgery status: Secondary | ICD-10-CM | POA: Diagnosis not present

## 2019-10-17 DIAGNOSIS — E7849 Other hyperlipidemia: Secondary | ICD-10-CM | POA: Diagnosis not present

## 2019-10-18 ENCOUNTER — Encounter: Payer: BC Managed Care – PPO | Attending: General Surgery | Admitting: Skilled Nursing Facility1

## 2019-10-18 ENCOUNTER — Other Ambulatory Visit: Payer: Self-pay

## 2019-10-18 DIAGNOSIS — E669 Obesity, unspecified: Secondary | ICD-10-CM | POA: Diagnosis not present

## 2019-10-19 NOTE — Progress Notes (Signed)
2 Week Post-Operative Nutrition Class   Patient was seen on 08/03/18 for Post-Operative Nutrition education at [facility]  Surgery date: 10/03/2019 Surgery type: RYGB Start weight at [name]: 261.8 Weight today: 245   Body Composition Scale 10/19/2019  Total Body Fat % 32.9  Visceral Fat 25  Fat-Free Mass % 67   Total Body Water % 48   Muscle-Mass lbs 46.1  Body Fat Displacement          Torso  lbs 49.9         Left Leg  lbs 9.9         Right Leg  lbs 9.9         Left Arm  lbs 4.9         Right Arm   lbs 4.9     The following the learning objectives were met by the patient during this course:  Identifies Phase 3 (Soft, High Proteins) Dietary Goals and will begin from 2 weeks post-operatively to 2 months post-operatively  Identifies appropriate sources of fluids and proteins   States protein recommendations and appropriate sources post-operatively  Identifies the need for appropriate texture modifications, mastication, and bite sizes when consuming solids  Identifies appropriate multivitamin and calcium sources post-operatively  Describes the need for physical activity post-operatively and will follow MD recommendations  States when to call healthcare provider regarding medication questions or post-operative complications   Handouts given during class include:  Phase 3A: Soft, High Protein Diet Handout   Follow-Up Plan: Patient will follow-up at [facility] in 6 weeks for 2 month post-op nutrition visit for diet advancement per MD.

## 2019-10-21 ENCOUNTER — Encounter: Payer: Self-pay | Admitting: Family Medicine

## 2019-10-24 ENCOUNTER — Telehealth: Payer: Self-pay | Admitting: Skilled Nursing Facility1

## 2019-10-24 NOTE — Telephone Encounter (Signed)
RD called pt to verify fluid intake once starting soft, solid proteins 2 week post-bariatric surgery.   Daily Fluid intake: 64+ Daily Protein intake: 80+  Concerns/issues:    No concerns  

## 2019-11-25 ENCOUNTER — Encounter: Payer: BC Managed Care – PPO | Attending: General Surgery | Admitting: Dietician

## 2019-11-25 ENCOUNTER — Encounter: Payer: Self-pay | Admitting: Dietician

## 2019-11-25 ENCOUNTER — Other Ambulatory Visit: Payer: Self-pay

## 2019-11-25 DIAGNOSIS — E669 Obesity, unspecified: Secondary | ICD-10-CM | POA: Diagnosis not present

## 2019-11-25 DIAGNOSIS — Z9884 Bariatric surgery status: Secondary | ICD-10-CM

## 2019-11-25 NOTE — Patient Instructions (Signed)

## 2019-11-25 NOTE — Progress Notes (Signed)
Bariatric Nutrition Follow-Up Visit Medical Nutrition Therapy  Appt Start Time: 9:30am    End Time: 10:00am  2 Months Post-Operative RYGB Surgery Surgery Date: 09/30/2019  Pt's Expectations of Surgery/ Goals:  to help better control blood sugar, come off medications, improve health Pt Reported Successes: lower blood sugar, no diabetes meds    NUTRITION ASSESSMENT  Anthropometrics  Start weight at [facility]: 261.8 lbs (date: 09/07/2019) Today's weight: 224.8 lbs  Body Composition Scale 10/19/2019 11/25/2019  Weight  lbs 245 224.8  BMI 37.3 34.1  Total Body Fat  % 32.9 29.9     Visceral Fat 25 21  Fat-Free Mass  % 67 70     Total Body Water  % 48 51     Muscle-Mass  lbs 46.1 42.3    Lifestyle & Dietary Hx Patient states he occasionally has gas pains or air trapped in his abdominal area but burping helps. Has spoken with surgeon about this. States he is able to get in his protein daily as long as he drinks a protein shake. Has only eaten protein sources. Drinks lots of water as well as other bariatric approved fluids. 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    24-Hr Dietary Recall First Meal: eggs (or [employer] sausage patties) Snack: -  Second Meal: shrimp  Snack: protein shake   Third Meal: hamburger (or chicken)  Snack: - Beverages: water, sugar-free flavor packets, Gatorade   Estimated daily fluid intake: 60-80 oz Estimated daily protein intake: 80 g Supplements: bariatric MVI, Tums  Current average weekly physical activity: farming   Post-Op Goals/ Signs/ Symptoms Using straws: rarely Drinking while eating: rarely Chewing/swallowing difficulties: no Changes in vision: no Changes to mood/headaches: no Hair loss/changes to skin/nails: no Difficulty focusing/concentrating: no Sweating: no Dizziness/lightheadedness: no Palpitations: no  Carbonated/caffeinated beverages: no N/V/D/C/Gas: gas Abdominal  pain: no Dumping syndrome: no   NUTRITION DIAGNOSIS  Overweight/obesity ([place]-3.3) related to past poor dietary habits and physical inactivity as evidenced by completed bariatric surgery and following dietary guidelines for continued weight loss and healthy nutrition status.   NUTRITION INTERVENTION Nutrition counseling (C-1) and education (E-2) to facilitate bariatric surgery goals, including:  Diet advancement to the next phase (phase 4) now including non-starchy vegetables   The importance of consuming adequate calories as well as certain nutrients daily due to the body's need for essential vitamins, minerals, and fats  The importance of daily physical activity and to reach a goal of at least 150 minutes of moderate to vigorous physical activity weekly (or as directed by their physician) due to benefits such as increased musculature and improved lab values  Handouts Provided Include   Phase 4: Protein + Non-Starchy Vegetables   Learning Style & Readiness for Change Teaching method utilized: Visual & Auditory  Demonstrated degree of understanding via: Teach Back  Barriers to learning/adherence to lifestyle change: None Identified    MONITORING & EVALUATION Dietary intake, weekly physical activity, body weight, and goals in 4 months.  Next Steps Patient is to follow-up in 4 months for 6 month post-op follow-up.

## 2019-12-01 ENCOUNTER — Ambulatory Visit (INDEPENDENT_AMBULATORY_CARE_PROVIDER_SITE_OTHER): Payer: BC Managed Care – PPO | Admitting: Family Medicine

## 2019-12-01 ENCOUNTER — Other Ambulatory Visit: Payer: Self-pay

## 2019-12-01 VITALS — BP 118/70 | HR 65 | Temp 98.4°F | Resp 12 | Ht 68.0 in | Wt 230.0 lb

## 2019-12-01 DIAGNOSIS — Z9884 Bariatric surgery status: Secondary | ICD-10-CM | POA: Diagnosis not present

## 2019-12-01 DIAGNOSIS — K760 Fatty (change of) liver, not elsewhere classified: Secondary | ICD-10-CM | POA: Diagnosis not present

## 2019-12-01 DIAGNOSIS — R112 Nausea with vomiting, unspecified: Secondary | ICD-10-CM | POA: Diagnosis not present

## 2019-12-01 DIAGNOSIS — R7989 Other specified abnormal findings of blood chemistry: Secondary | ICD-10-CM

## 2019-12-01 NOTE — Progress Notes (Signed)
[place] at [facility] [address], [place], [place] [address] [phone]  Date:  12/01/2019   Name:  Steven Hodge   DOB:  November 19, 1960   MRN:  [id number]  PCP:  Darreld Mclean, MD    Chief Complaint: 6 month follow up   History of Present Illness:  Steven Hodge is a 59 y.o. very pleasant male patient who presents with the following:  Patient here today for 6-week follow-up visit I saw him on May 10 following gastric bypass on April 26 At that time he was doing quite well, no longer requiring diabetes medications: Following up today after gastric bypass.  He is overall doing great so far.  No longer on any DM medications, he brings in a list of recent glucose readings today, all under 150 continue lisinopril 5 mg for now for renal protection He will see me in 6 weeks for our next visit and labs  He will contact me if any concerns in the meantime Encouraged him to get covid 19 series asap   COVID-19 series- not done yet, I encouraged him to get this vaccine series Eye exam is due- he will update   He is overall doing well  Wt Readings from Last 3 Encounters:  12/01/19 230 lb (104.3 kg)  11/25/19 224 lb 12.8 oz (102 kg)  10/19/19 245 lb (111.1 kg)   His max weight was 325.  He was at 265 at time of surgery  His bowels are normal-not having diarrhea or constipation   His main concern right now is gassiness after eating which may cause him to vomit .  He will be eating normally, and will have a sudden discomfort in his stomach which he can only resolved by vomiting - any food may do it. Last occurred 10 days ago He is now allowed to eat vegetables again and hopes that this may help His blood sugar is running 85- 125 without any diabetes medications  He may get a bit lightheaded when he stands up from bending over, but is not bothered by this in particular Patient Active Problem List   Diagnosis Date Noted  . H/O  gastric bypass 10/16/2019  . Severe obesity ([place]) 10/03/2019  . Fatty liver 07/21/2019  . Other hyperlipidemia 05/07/2019  . Type 2 diabetes mellitus with hyperglycemia, without long-term current use of insulin ([place]) 07/06/2017  . 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  . Vitamin D deficiency 11/18/2016  . Essential hypertension 09/18/2016  . History of colonic polyps 08/20/2016    Past Medical History:  Diagnosis Date  . Arthritis    knees shoulders hands  . Back pain   . Cancer ([place]) 06/09/2006   Basal cell carcinoma scalp;   [name] Diabetes mellitus without complication ([place])   . Gout   . Hx of blood clots   . Hypertension     Past Surgical History:  Procedure Laterality Date  . basel cell cancer removed  2009  . GASTRIC ROUX-EN-Y N/A 10/03/2019   Procedure: LAPAROSCOPIC ROUX-EN-Y GASTRIC BYPASS, HIATAL HERNIA REPARI, WITH UPPER ENDOSCOPY, ERAS Pathway;  Surgeon: Greer Pickerel, MD;  Location: [facility];  Service: General;  Laterality: N/A;  . KNEE SURGERY  2005   ACL repair R  . UMBILICAL HERNIA REPAIR  10/03/2019   Procedure: HERNIA REPAIR UMBILICAL ADULT;  Surgeon: Greer Pickerel, MD;  Location: [facility];  Service: General;;    Social History  Tobacco Use  . Smoking status: Former Research scientist (life sciences)  . Smokeless tobacco: Never Used  Vaping Use  . Vaping Use: Never used  Substance Use Topics  . Alcohol use: No  . Drug use: No    Family History  Problem Relation Age of Onset  . Diabetes Mother   . Liver disease Mother   . Obesity Mother   . Cancer Father 57       lung cancer  . Diabetes Father   . Cancer Sister 76       lung cancer  . Heart disease Brother 81       cardiac stenting/CAD  . Cancer Brother        skin cancer  . Arthritis Brother     No Known Allergies  Medication list has been reviewed and updated.  Current Outpatient Medications on File Prior to Visit  Medication Sig Dispense Refill  . allopurinol (ZYLOPRIM) 300 MG tablet Take 1  tablet (300 mg total) by mouth daily. 90 tablet 3  . blood glucose meter kit and supplies Dispense based on patient and insurance preference. Use up to four times daily as directed. (FOR ICD-9 250.00, 250.01). 1 each 0  . glucose blood (CONTOUR NEXT TEST) test strip Use as instructed 100 strip 0  . lisinopril (ZESTRIL) 5 MG tablet Take 1 tablet (5 mg total) by mouth daily. 90 tablet 0  . Multiple Vitamin (MULTIVITAMIN) tablet Take 1 tablet by mouth daily.    . pantoprazole (PROTONIX) 40 MG tablet Take 1 tablet (40 mg total) by mouth daily. 90 tablet 0  . polyethylene glycol powder (GLYCOLAX/MIRALAX) powder Take 17 g by mouth daily. 3350 g 0  . Vitamin D, Ergocalciferol, (DRISDOL) 1.25 MG (50000 UNIT) CAPS capsule Take 1 capsule (50,000 Units total) by mouth every 7 (seven) days. 12 capsule 0   No current facility-administered medications on file prior to visit.    Review of Systems:  As per HPI- otherwise negative.   Physical Examination: Vitals:   12/01/19 1602  BP: 118/70  Pulse: 65  Resp: 12  Temp: 98.4 F (36.9 C)  SpO2: 98%   Vitals:   12/01/19 1602  Weight: 230 lb (104.3 kg)  Height: _0  (1.727 m)   Body mass index is 34.97 kg/m. Ideal Body Weight: Weight in (lb) to have BMI = 25: 164.1  GEN: no acute distress.  Obese, looks well, has lost weight from max HEENT: Atraumatic, Normocephalic.  Ears and Nose: No external deformity. CV: RRR, No M/G/R. No JVD. No thrill. No extra heart sounds. PULM: CTA B, no wheezes, crackles, rhonchi. No retractions. No resp. distress. No accessory muscle use. ABD: S, NT, ND EXTR: No c/c/e PSYCH: Normally interactive. Conversant.    Assessment and Plan: H/O gastric bypass - Plan: CBC, Comprehensive metabolic panel  Elevated liver function tests  Fatty liver  Non-intractable vomiting with nausea, unspecified vomiting type   Following up today after recent gastric bypass surgery.  He was noted to have elevated LFTs at his  preop labs thought due to fatty liver, will recheck this today We discussed his vomiting after eating.  It sounds as though he is unable to burp since his surgery, and only way his body can deal with the gases to vomit.  He will try taking Gas-X to see if this may help.  We hope that he will "grow out of"  this issue as his body adjust to gastric bypass  I asked him to follow-up with me  in 3 months, he will keep me posted about any concerns in the meantime Moderate medical decision making today This visit occurred during the SARS-CoV-2 public health emergency.  Safety protocols were in place, including screening questions prior to the visit, additional usage of staff PPE, and extensive cleaning of exam room while observing appropriate contact time as indicated for disinfecting solutions.    Signed Lamar Blinks, MD

## 2019-12-01 NOTE — Progress Notes (Deleted)
[place] at [facility] [address], [place], [place] [address] [phone]  Date:  12/01/2019   Name:  Steven Hodge   DOB:  [birth year]   MRN:  [id number]  PCP:  Darreld Mclean, MD    Chief Complaint: No chief complaint on file.   History of Present Illness:  Steven Hodge is a 59 y.o. very pleasant male patient who presents with the following:  Patient here today for 6-week follow-up visit I saw him on May 10 following gastric bypass on April 26 At that time he was doing quite well, no longer requiring diabetes medications: Following up today after gastric bypass.  He is overall doing great so far.  No longer on any DM medications, he brings in a list of recent glucose readings today, all under 150 continue lisinopril 5 mg for now for renal protection He will see me in 6 weeks for our next visit and labs  He will contact me if any concerns in the meantime Encouraged him to get covid 19 series asap   COVID-19 series Eye exam is due Patient Active Problem List   Diagnosis Date Noted  . H/O gastric bypass 10/16/2019  . Severe obesity ([place]) 10/03/2019  . Fatty liver 07/21/2019  . Other hyperlipidemia 05/07/2019  . Type 2 diabetes mellitus with hyperglycemia, without long-term current use of insulin ([place]) 07/06/2017  . 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  . Vitamin D deficiency 11/18/2016  . Essential hypertension 09/18/2016  . History of colonic polyps 08/20/2016    Past Medical History:  Diagnosis Date  . Arthritis    knees shoulders hands  . Back pain   . Cancer ([place]) 06/09/2006   Basal cell carcinoma scalp;   [name] Diabetes mellitus without complication ([employer])   . Gout   . Hx of blood clots   . Hypertension     Past Surgical History:  Procedure Laterality Date  . basel cell cancer removed  2009  . GASTRIC ROUX-EN-Y N/A 10/03/2019   Procedure: LAPAROSCOPIC ROUX-EN-Y GASTRIC BYPASS,  HIATAL HERNIA REPARI, WITH UPPER ENDOSCOPY, ERAS Pathway;  Surgeon: Greer Pickerel, MD;  Location: [facility];  Service: General;  Laterality: N/A;  . KNEE SURGERY  2005   ACL repair R  . UMBILICAL HERNIA REPAIR  10/03/2019   Procedure: HERNIA REPAIR UMBILICAL ADULT;  Surgeon: Greer Pickerel, MD;  Location: [facility];  Service: General;;    Social History   Tobacco Use  . Smoking status: Former Research scientist (life sciences)  . Smokeless tobacco: Never Used  Vaping Use  . Vaping Use: Never used  Substance Use Topics  . Alcohol use: No  . Drug use: No    Family History  Problem Relation Age of Onset  . Diabetes Mother   . Liver disease Mother   . Obesity Mother   . Cancer Father 16       lung cancer  . Diabetes Father   . Cancer Sister 93       lung cancer  . Heart disease Brother 63       cardiac stenting/CAD  . Cancer Brother        skin cancer  . Arthritis Brother     No Known Allergies  Medication list has been reviewed and updated.  Current Outpatient Medications on File Prior to Visit  Medication Sig Dispense Refill  . allopurinol (ZYLOPRIM) 300 MG tablet Take 1 tablet (300 mg total)  by mouth daily. 90 tablet 3  . blood glucose meter kit and supplies Dispense based on patient and insurance preference. Use up to four times daily as directed. (FOR ICD-9 250.00, 250.01). (Patient not taking: Reported on 10/17/2019) 1 each 0  . Coenzyme Q10 (CO Q 10 PO) Take 300 mg by mouth daily.     . fish oil-omega-3 fatty acids 1000 MG capsule Take 2 g by mouth daily.    [name] glucosamine-chondroitin 500-400 MG tablet Take 1 tablet by mouth daily.     [name] glucose blood (CONTOUR NEXT TEST) test strip Use as instructed (Patient not taking: Reported on 10/17/2019) 100 strip 0  . lisinopril (ZESTRIL) 5 MG tablet Take 1 tablet (5 mg total) by mouth daily. 90 tablet 0  . Multiple Vitamin (MULTIVITAMIN) tablet Take 1 tablet by mouth daily.    . ondansetron (ZOFRAN-ODT) 4 MG disintegrating tablet Take 1 tablet (4 mg total) by  mouth every 6 (six) hours as needed for nausea or vomiting. (Patient not taking: Reported on 10/17/2019) 20 tablet 0  . pantoprazole (PROTONIX) 40 MG tablet Take 1 tablet (40 mg total) by mouth daily. 90 tablet 0  . polyethylene glycol powder (GLYCOLAX/MIRALAX) powder Take 17 g by mouth daily. (Patient not taking: Reported on 09/21/2019) 3350 g 0  . TURMERIC PO Take 1,000 mg by mouth 2 (two) times daily.    . Vitamin D, Ergocalciferol, (DRISDOL) 1.25 MG (50000 UNIT) CAPS capsule Take 1 capsule (50,000 Units total) by mouth every 7 (seven) days. (Patient not taking: Reported on 10/17/2019) 12 capsule 0   No current facility-administered medications on file prior to visit.    Review of Systems:  As per HPI- otherwise negative.   Physical Examination: There were no vitals filed for this visit. There were no vitals filed for this visit. There is no height or weight on file to calculate BMI. Ideal Body Weight:    GEN: no acute distress. HEENT: Atraumatic, Normocephalic.  Ears and Nose: No external deformity. CV: RRR, No M/G/R. No JVD. No thrill. No extra heart sounds. PULM: CTA B, no wheezes, crackles, rhonchi. No retractions. No resp. distress. No accessory muscle use. ABD: S, NT, ND, +BS. No rebound. No HSM. EXTR: No c/c/e PSYCH: Normally interactive. Conversant.    Assessment and Plan: *** This visit occurred during the SARS-CoV-2 public health emergency.  Safety protocols were in place, including screening questions prior to the visit, additional usage of staff PPE, and extensive cleaning of exam room while observing appropriate contact time as indicated for disinfecting solutions.    Signed Lamar Blinks, MD

## 2019-12-01 NOTE — Patient Instructions (Addendum)
It was good to see you again- I will be in touch with your labs asap I hope that you continue to do well- please see me in 4 months  Try some gas-x before meals a few times, I would like to see if this helps at all.  However, it sounds like the issue is that you cannot burp normally and that vomiting is the only way you can get up air trapped in your stomach right now.

## 2019-12-02 ENCOUNTER — Encounter: Payer: Self-pay | Admitting: Family Medicine

## 2019-12-02 LAB — CBC
HCT: 42.1 % (ref 39.0–52.0)
Hemoglobin: 14.3 g/dL (ref 13.0–17.0)
MCHC: 34 g/dL (ref 30.0–36.0)
MCV: 91.8 fl (ref 78.0–100.0)
Platelets: 153 10*3/uL (ref 150.0–400.0)
RBC: 4.59 Mil/uL (ref 4.22–5.81)
RDW: 15.2 % (ref 11.5–15.5)
WBC: 7.5 10*3/uL (ref 4.0–10.5)

## 2019-12-02 LAB — COMPREHENSIVE METABOLIC PANEL
ALT: 26 U/L (ref 0–53)
AST: 19 U/L (ref 0–37)
Albumin: 4.3 g/dL (ref 3.5–5.2)
Alkaline Phosphatase: 79 U/L (ref 39–117)
BUN: 19 mg/dL (ref 6–23)
CO2: 26 mEq/L (ref 19–32)
Calcium: 9.4 mg/dL (ref 8.4–10.5)
Chloride: 104 mEq/L (ref 96–112)
Creatinine, Ser: 0.94 mg/dL (ref 0.40–1.50)
GFR: 82.81 mL/min (ref >60.00)
Glucose, Bld: 117 mg/dL — ABNORMAL HIGH (ref 70–99)
Potassium: 4.2 mEq/L (ref 3.5–5.1)
Sodium: 143 mEq/L (ref 135–145)
Total Bilirubin: 0.4 mg/dL (ref 0.2–1.2)
Total Protein: 6.5 g/dL (ref 6.0–8.3)

## 2020-01-02 ENCOUNTER — Other Ambulatory Visit (INDEPENDENT_AMBULATORY_CARE_PROVIDER_SITE_OTHER): Payer: Self-pay | Admitting: Family Medicine

## 2020-01-02 DIAGNOSIS — E559 Vitamin D deficiency, unspecified: Secondary | ICD-10-CM

## 2020-01-10 ENCOUNTER — Emergency Department (HOSPITAL_COMMUNITY): Payer: BC Managed Care – PPO

## 2020-01-10 ENCOUNTER — Other Ambulatory Visit: Payer: Self-pay

## 2020-01-10 ENCOUNTER — Emergency Department (HOSPITAL_COMMUNITY)
Admission: EM | Admit: 2020-01-10 | Discharge: 2020-01-10 | Disposition: A | Discharge status: Home / Self Care | Payer: BC Managed Care – PPO | Attending: Emergency Medicine | Admitting: Emergency Medicine

## 2020-01-10 ENCOUNTER — Encounter (HOSPITAL_COMMUNITY): Payer: Self-pay

## 2020-01-10 DIAGNOSIS — Z9889 Other specified postprocedural states: Secondary | ICD-10-CM | POA: Diagnosis not present

## 2020-01-10 DIAGNOSIS — Z5321 Procedure and treatment not carried out due to patient leaving prior to being seen by health care provider: Secondary | ICD-10-CM | POA: Insufficient documentation

## 2020-01-10 DIAGNOSIS — R110 Nausea: Secondary | ICD-10-CM | POA: Insufficient documentation

## 2020-01-10 DIAGNOSIS — R109 Unspecified abdominal pain: Secondary | ICD-10-CM | POA: Insufficient documentation

## 2020-01-10 DIAGNOSIS — I700 Atherosclerosis of aorta: Secondary | ICD-10-CM | POA: Diagnosis not present

## 2020-01-10 DIAGNOSIS — N132 Hydronephrosis with renal and ureteral calculous obstruction: Secondary | ICD-10-CM | POA: Diagnosis not present

## 2020-01-10 LAB — URINALYSIS, ROUTINE W REFLEX MICROSCOPIC
Bacteria, UA: NONE SEEN
Bilirubin Urine: NEGATIVE
Glucose, UA: NEGATIVE mg/dL
Ketones, ur: NEGATIVE mg/dL
Leukocytes,Ua: NEGATIVE
Nitrite: NEGATIVE
Protein, ur: NEGATIVE mg/dL
RBC / HPF: >50 RBC/hpf — ABNORMAL HIGH (ref 0–5)
Specific Gravity, Urine: 1.025 (ref 1.005–1.030)
pH: 5 (ref 5.0–8.0)

## 2020-01-10 NOTE — ED Triage Notes (Signed)
Patient c/o sudden onset of right flank pain and nausea since 1300 today. patient denies any problems urinating.

## 2020-01-11 ENCOUNTER — Other Ambulatory Visit: Payer: Self-pay

## 2020-01-11 ENCOUNTER — Encounter: Payer: Self-pay | Admitting: Physician Assistant

## 2020-01-11 ENCOUNTER — Ambulatory Visit: Payer: BC Managed Care – PPO | Admitting: Physician Assistant

## 2020-01-11 VITALS — BP 108/70 | HR 68 | Temp 97.9°F | Resp 16 | Ht 68.0 in | Wt 222.0 lb

## 2020-01-11 DIAGNOSIS — N201 Calculus of ureter: Secondary | ICD-10-CM

## 2020-01-11 MED ORDER — TAMSULOSIN HCL 0.4 MG PO CAPS
0.4000 mg | ORAL_CAPSULE | Freq: Every day | ORAL | 0 refills | Status: DC
Start: 2020-01-11 — End: 2020-03-15

## 2020-01-11 MED ORDER — HYDROCODONE-ACETAMINOPHEN 5-325 MG PO TABS: 1.0000 | ORAL_TABLET | Freq: Four times a day (QID) | ORAL | 0 refills | Status: DC | PRN

## 2020-01-11 NOTE — Patient Instructions (Addendum)
Please keep well-hydrated. Use the strainer given to strain urine so we will know for sure when you pass the stone.  The stone is tiny according to your CT scan and is almost to the bladder. Once in the bladder there should be no more pain.  Take the Flomax in the evening to help flush this stone out!  The hydrocodone is to use no more than as directed if needed.  If you do not pass within a week or if you note any difficulty urinating, please let me or Dr. Lorelei Pont know ASAP.    Dietary Guidelines to Help Prevent Kidney Stones Kidney stones are deposits of minerals and salts that form inside your kidneys. Your risk of developing kidney stones may be greater depending on your diet, your lifestyle, the medicines you take, and whether you have certain medical conditions. Most people can reduce their chances of developing kidney stones by following the instructions below. Depending on your overall health and the type of kidney stones you tend to develop, your dietitian may give you more specific instructions. What are tips for following this plan? Reading food labels  Choose foods with "no salt added" or "low-salt" labels. Limit your sodium intake to less than 1500 mg per day.  Choose foods with calcium for each meal and snack. Try to eat about 300 mg of calcium at each meal. Foods that contain 200-500 mg of calcium per serving include: ? 8 oz (237 ml) of milk, fortified nondairy milk, and fortified fruit juice. ? 8 oz (237 ml) of kefir, yogurt, and soy yogurt. ? 4 oz (118 ml) of tofu. ? 1 oz of cheese. ? 1 cup (300 g) of dried figs. ? 1 cup (91 g) of cooked broccoli. ? 1-3 oz can of sardines or mackerel.  Most people need 1000 to 1500 mg of calcium each day. Talk to your dietitian about how much calcium is recommended for you. Shopping  Buy plenty of fresh fruits and vegetables. Most people do not need to avoid fruits and vegetables, even if they contain nutrients that may contribute to  kidney stones.  When shopping for convenience foods, choose: ? Whole pieces of fruit. ? Premade salads with dressing on the side. ? Low-fat fruit and yogurt smoothies.  Avoid buying frozen meals or prepared deli foods.  Look for foods with live cultures, such as yogurt and kefir. Cooking  Do not add salt to food when cooking. Place a salt shaker on the table and allow each person to add his or her own salt to taste.  Use vegetable protein, such as beans, textured vegetable protein (TVP), or tofu instead of meat in pasta, casseroles, and soups. Meal planning   Eat less salt, if told by your dietitian. To do this: ? Avoid eating processed or premade food. ? Avoid eating fast food.  Eat less animal protein, including cheese, meat, poultry, or fish, if told by your dietitian. To do this: ? Limit the number of times you have meat, poultry, fish, or cheese each week. Eat a diet free of meat at least 2 days a week. ? Eat only one serving each day of meat, poultry, fish, or seafood. ? When you prepare animal protein, cut pieces into small portion sizes. For most meat and fish, one serving is about the size of one deck of cards.  Eat at least 5 servings of fresh fruits and vegetables each day. To do this: ? Keep fruits and vegetables on hand for snacks. ?  Eat 1 piece of fruit or a handful of berries with breakfast. ? Have a salad and fruit at lunch. ? Have two kinds of vegetables at dinner.  Limit foods that are high in a substance called oxalate. These include: ? Spinach. ? Rhubarb. ? Beets. ? Potato chips and french fries. ? Nuts.  If you regularly take a diuretic medicine, make sure to eat at least 1-2 fruits or vegetables high in potassium each day. These include: ? Avocado. ? Banana. ? Orange, prune, carrot, or tomato juice. ? Baked potato. ? Cabbage. ? Beans and split peas. General instructions   Drink enough fluid to keep your urine clear or pale yellow. This is the  most important thing you can do.  Talk to your health care provider and dietitian about taking daily supplements. Depending on your health and the cause of your kidney stones, you may be advised: ? Not to take supplements with vitamin C. ? To take a calcium supplement. ? To take a daily probiotic supplement. ? To take other supplements such as magnesium, fish oil, or vitamin B6.  Take all medicines and supplements as told by your health care provider.  Limit alcohol intake to no more than 1 drink a day for nonpregnant women and 2 drinks a day for men. One drink equals 12 oz of beer, 5 oz of wine, or 1 oz of hard liquor.  Lose weight if told by your health care provider. Work with your dietitian to find strategies and an eating plan that works best for you. What foods are not recommended? Limit your intake of the following foods, or as told by your dietitian. Talk to your dietitian about specific foods you should avoid based on the type of kidney stones and your overall health. Grains Breads. Bagels. Rolls. Baked goods. Salted crackers. Cereal. Pasta. Vegetables Spinach. Rhubarb. Beets. Canned vegetables. Angie Fava. Olives. Meats and other protein foods Nuts. Nut butters. Large portions of meat, poultry, or fish. Salted or cured meats. Deli meats. Hot dogs. Sausages. Dairy Cheese. Beverages Regular soft drinks. Regular vegetable juice. Seasonings and other foods Seasoning blends with salt. Salad dressings. Canned soups. Soy sauce. Ketchup. Barbecue sauce. Canned pasta sauce. Casseroles. Pizza. Lasagna. Frozen meals. Potato chips. [place] fries. Summary  You can reduce your risk of kidney stones by making changes to your diet.  The most important thing you can do is drink enough fluid. You should drink enough fluid to keep your urine clear or pale yellow.  Ask your health care provider or dietitian how much protein from animal sources you should eat each day, and also how much salt and  calcium you should have each day. This information is not intended to replace advice given to you by your health care provider. Make sure you discuss any questions you have with your health care provider. Document Revised: 09/15/2018 Document Reviewed: 05/06/2016 Elsevier Patient Education  2020 [employer].

## 2020-01-11 NOTE — Progress Notes (Signed)
Patient presents to clinic today for R-sided flank pain. Patient endorses having episodes of severe flank pain yesterday associated with some mild nausea. Would not let up and so he went to seek care at the ER. Patient states he was triaged and sent for labs and CT, then put back in the waiting room. States he was there from 2P-11P and had not been taking back, pain had subsided and so he went home. Has noted colicky pain, much improved from levels yesterday. Denies fever, chills, dysuria, urgency, frequency, hesitancy. Denies any gross hematuria. ER workup yesterday included UA showing large blood and calcium oxalate. CT renal study revealed a punctate stone of R ureter, just proximal to the right UVJ.   Past Medical History:  Diagnosis Date  . Arthritis    knees shoulders hands  . Back pain   . Cancer ([place]) 06/09/2006   Basal cell carcinoma scalp;   [name] Diabetes mellitus without complication ([place])   . Gout   . Hx of blood clots   . Hypertension     Current Outpatient Medications on File Prior to Visit  Medication Sig Dispense Refill  . allopurinol (ZYLOPRIM) 300 MG tablet Take 1 tablet (300 mg total) by mouth daily. 90 tablet 3  . blood glucose meter kit and supplies Dispense based on patient and insurance preference. Use up to four times daily as directed. (FOR ICD-9 250.00, 250.01). 1 each 0  . glucose blood (CONTOUR NEXT TEST) test strip Use as instructed 100 strip 0  . lisinopril (ZESTRIL) 5 MG tablet Take 1 tablet (5 mg total) by mouth daily. 90 tablet 0  . Multiple Vitamin (MULTIVITAMIN) tablet Take 1 tablet by mouth daily.    . polyethylene glycol powder (GLYCOLAX/MIRALAX) powder Take 17 g by mouth daily. 3350 g 0  . Turmeric 400 MG CAPS Take 1 capsule by mouth daily.    . pantoprazole (PROTONIX) 40 MG tablet Take 1 tablet (40 mg total) by mouth daily. (Patient not taking: Reported on 01/11/2020) 90 tablet 0  . Vitamin D, Ergocalciferol, (DRISDOL) 1.25 MG (50000 UNIT) CAPS capsule  Take 1 capsule (50,000 Units total) by mouth every 7 (seven) days. (Patient not taking: Reported on 01/11/2020) 12 capsule 0   No current facility-administered medications on file prior to visit.    No Known Allergies  Family History  Problem Relation Age of Onset  . Diabetes Mother   . Liver disease Mother   . Obesity Mother   . Cancer Father 36       lung cancer  . Diabetes Father   . Cancer Sister 4       lung cancer  . Heart disease Brother 26       cardiac stenting/CAD  . Cancer Brother        skin cancer  . Arthritis Brother     Social History   Socioeconomic History  . Marital status: Married    Spouse name: Not on file  . Number of children: Not on file  . Years of education: Not on file  . Highest education level: Not on file  Occupational History  . Occupation: Medical illustrator, Dealer  Tobacco Use  . Smoking status: Former Research scientist (life sciences)  . Smokeless tobacco: Never Used  Vaping Use  . Vaping Use: Never used  Substance and Sexual Activity  . Alcohol use: No  . Drug use: No  . Sexual activity: Yes  Other Topics Concern  . 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  Walking once per week in 2018.         Social Determinants of Health   Financial Resource Strain:   . Difficulty of Paying Living Expenses:   Food Insecurity:   . Worried About Charity fundraiser in the Last Year:   . Arboriculturist in the Last Year:   Transportation Needs:   . Film/video editor (Medical):   [name] Lack of Transportation (Non-Medical):   Physical Activity:   . Days of Exercise per Week:   . Minutes of Exercise per Session:   Stress:   . Feeling of Stress :   Social Connections:   . Frequency of Communication  with Friends and Family:   . Frequency of Social Gatherings with Friends and Family:   . Attends Religious Services:   . Active Member of Clubs or Organizations:   . Attends Archivist Meetings:   [name] Marital Status:     Review of Systems - See HPI.  All other ROS are negative.  BP 108/70   Pulse 68   Temp 97.9 F (36.6 C) (Temporal)   Resp 16   Ht 5' 8" (1.727 m)   Wt 222 lb (100.7 kg)   SpO2 98%   BMI 33.75 kg/m   Physical Exam Vitals reviewed.  Constitutional:      Appearance: Normal appearance.  HENT:     Head: Normocephalic and atraumatic.  Cardiovascular:     Rate and Rhythm: Normal rate and regular rhythm.     Pulses: Normal pulses.     Heart sounds: Normal heart sounds.  Pulmonary:     Effort: Pulmonary effort is normal.     Breath sounds: Normal breath sounds.  Abdominal:     General: There is no distension.     Palpations: There is no mass.     Tenderness: There is no abdominal tenderness. There is no right CVA tenderness or left CVA tenderness.     Hernia: No hernia is present.  Musculoskeletal:     Cervical back: Neck supple.  Neurological:     General: No focal deficit present.     Mental Status: He is alert and oriented to person, place, and time.  Psychiatric:        Mood and Affect: Mood normal.     Recent Results (from the past 2160 hour(s))  CBC     Status: None   Collection Time: 12/01/19  3:49 PM  Result Value Ref Range   WBC 7.5 4.0 - 10.5 K/uL   RBC 4.59 4.22 - 5.81 Mil/uL   Platelets 153.0 150 - 400 K/uL   Hemoglobin 14.3 13.0 - 17.0 g/dL   HCT 42.1 39 - 52 %   MCV 91.8 78.0 - 100.0 fl   MCHC 34.0 30.0 - 36.0 g/dL   RDW 15.2 11.5 - 15.5 %  Comprehensive metabolic panel     Status: Abnormal   Collection Time: 12/01/19  3:49 PM  Result Value Ref Range   Sodium 143 135 - 145 mEq/L  Potassium 4.2 3.5 - 5.1 mEq/L   Chloride 104 96 - 112 mEq/L   CO2 26 19 - 32 mEq/L   Glucose, Bld 117 (H) 70 - 99 mg/dL   BUN 19 6 - 23  mg/dL   Creatinine, Ser 0.94 0.40 - 1.50 mg/dL   Total Bilirubin 0.4 0.2 - 1.2 mg/dL   Alkaline Phosphatase 79 39 - 117 U/L   AST 19 0 - 37 U/L   ALT 26 0 - 53 U/L   Total Protein 6.5 6.0 - 8.3 g/dL   Albumin 4.3 3.5 - 5.2 g/dL   GFR 82.81 >60.00 mL/min   Calcium 9.4 8.4 - 10.5 mg/dL  Urinalysis, Routine w reflex microscopic- may I&O cath if menses     Status: Abnormal   Collection Time: 01/10/20  2:47 PM  Result Value Ref Range   Color, Urine YELLOW YELLOW   APPearance CLEAR CLEAR   Specific Gravity, Urine 1.025 1.005 - 1.030   pH 5.0 5.0 - 8.0   Glucose, UA NEGATIVE NEGATIVE mg/dL   Hgb urine dipstick LARGE (A) NEGATIVE   Bilirubin Urine NEGATIVE NEGATIVE   Ketones, ur NEGATIVE NEGATIVE mg/dL   Protein, ur NEGATIVE NEGATIVE mg/dL   Nitrite NEGATIVE NEGATIVE   Leukocytes,Ua NEGATIVE NEGATIVE   RBC / HPF >50 (H) 0 - 5 RBC/hpf   WBC, UA 0-5 0 - 5 WBC/hpf   Bacteria, UA NONE SEEN NONE SEEN   Mucus PRESENT    Ca Oxalate Crys, UA PRESENT     Comment: Performed at [facility], [address], [place], [place] [address]    Assessment/Plan: 1. Right ureteral calculus Punctate stone at right UVJ. Thankfully close to bladder. No pain at present. Patient to increase fluids. Rx Flomax. Strainer given. Rx hydrocodone to use as directed if needed for severe pain only. Discussed with patient once stone reaches bladder pain should stop but may experience mild urinary symptoms. Strict follow-up precautions reviewed and ER precautions reviewed with patient.   This visit occurred during the SARS-CoV-2 public health emergency.  Safety protocols were in place, including screening questions prior to the visit, additional usage of staff PPE, and extensive cleaning of exam room while observing appropriate contact time as indicated for disinfecting solutions.      Leeanne Rio, PA-C

## 2020-01-17 ENCOUNTER — Other Ambulatory Visit (INDEPENDENT_AMBULATORY_CARE_PROVIDER_SITE_OTHER): Payer: Self-pay | Admitting: Family Medicine

## 2020-01-17 DIAGNOSIS — I10 Essential (primary) hypertension: Secondary | ICD-10-CM

## 2020-02-14 DIAGNOSIS — D225 Melanocytic nevi of trunk: Secondary | ICD-10-CM | POA: Diagnosis not present

## 2020-02-14 DIAGNOSIS — Z85828 Personal history of other malignant neoplasm of skin: Secondary | ICD-10-CM | POA: Diagnosis not present

## 2020-02-14 DIAGNOSIS — L570 Actinic keratosis: Secondary | ICD-10-CM | POA: Diagnosis not present

## 2020-02-14 DIAGNOSIS — D235 Other benign neoplasm of skin of trunk: Secondary | ICD-10-CM | POA: Diagnosis not present

## 2020-02-14 DIAGNOSIS — L821 Other seborrheic keratosis: Secondary | ICD-10-CM | POA: Diagnosis not present

## 2020-03-12 ENCOUNTER — Encounter: Payer: BC Managed Care – PPO | Attending: General Surgery | Admitting: Dietician

## 2020-03-12 ENCOUNTER — Encounter: Payer: Self-pay | Admitting: Dietician

## 2020-03-12 ENCOUNTER — Other Ambulatory Visit: Payer: Self-pay

## 2020-03-12 DIAGNOSIS — E669 Obesity, unspecified: Secondary | ICD-10-CM | POA: Insufficient documentation

## 2020-03-12 NOTE — Patient Instructions (Signed)
.   Continue to aim for a minimum of 64 fluid ounces daily with at least 32 ounces being plain water . Eat non-starchy vegetables 2 times a day 7 days a week . Per meal/snack, eat 2-3 ounces of protein first  o  Add starchy vegetables (potatoes, sweet potatoes, corn, peas) as desired as long as you are able to meet your daily protein goal and eat non-starchy vegetables 2+ times per day . Continue to aim for 30 minutes of physical activity at least 5 times a week . Remember to take 3 calcium's plus your bariatric multivitamin DAILY  

## 2020-03-12 NOTE — Progress Notes (Signed)
Bariatric Nutrition Follow-Up Visit Medical Nutrition Therapy  Appt Start Time: 4:40pm   End Time: 5:10pm  6 Months Post-Operative RYGB Surgery Surgery Date: 09/30/2019  Pt's Expectations of Surgery/ Goals:  to help better control blood sugar, come off medications, improve health Pt Reported Successes: lower blood sugar, no diabetes or blood pressure meds    NUTRITION ASSESSMENT  Anthropometrics  Start weight at [facility]: 261.8 lbs (date: 09/07/2019) Today's weight: 219.6 lbs  Body Composition Scale 10/19/2019 11/25/2019 03/12/2020  Weight  lbs 245 224.8 219.6  BMI 37.3 34.1 33.3  Total Body Fat  % 32.9 29.9 29.1     Visceral Fat 25 21 20   Fat-Free Mass  % 67 70 70.8     Total Body Water  % 48 51 51.8     Muscle-Mass  lbs 46.1 42.3 41.3    Lifestyle & Dietary Hx Patient states he occasionally has gas pains or air trapped in his abdominal area but burping and drinking carbonated soda helps. States he is able to get in his protein daily and will have a protein shake for a snack to help with this. Drinks lots of water as well as other bariatric approved fluids. States his energy levels remain good and he is very active with farming, states he has noticed some loss of strength.   24-Hr Dietary Recall First Meal: egg + bacon + maybe toast  Snack: -  Second Meal: protein + veggie  Snack: protein shake (or nuts, or beef jerky)  Third Meal: spaghetti w/ meat sauce & whole wheat noodles  Snack: - Beverages: decaf coffee, water, sugar-free flavor packets, zero-calorie soft drink, Gatorade Zero  Estimated daily fluid intake: 60-80 oz Estimated daily protein intake: 80 g Supplements: bariatric MVI, Tums  Current average weekly physical activity: farming   Post-Op Goals/ Signs/ Symptoms Using straws: rarely Drinking while eating: rarely Chewing/swallowing difficulties: no Changes in vision: no Changes to mood/headaches: no Hair loss/changes to skin/nails: no Difficulty  focusing/concentrating: no Sweating: no Dizziness/lightheadedness: no Palpitations: no  Carbonated/caffeinated beverages: no N/V/D/C/Gas: gas Abdominal pain: no Dumping syndrome: no   NUTRITION DIAGNOSIS  Overweight/obesity ([place]-3.3) related to past poor dietary habits and physical inactivity as evidenced by completed bariatric surgery and following dietary guidelines for continued weight loss and healthy nutrition status.   NUTRITION INTERVENTION Nutrition counseling (C-1) and education (E-2) to facilitate bariatric surgery goals, including: . Diet advancement to the next phase (phase 5) now including starchy vegetables  . The importance of consuming adequate calories as well as certain nutrients daily due to the body's need for essential vitamins, minerals, and fats . The importance of daily physical activity and to reach a goal of at least 150 minutes of moderate to vigorous physical activity weekly (or as directed by their physician) due to benefits such as increased musculature and improved lab values  Handouts Provided Include   Phase 5: Protein + All Vegetables   Learning Style & Readiness for Change Teaching method utilized: Visual & Auditory  Demonstrated degree of understanding via: Teach Back  Barriers to learning/adherence to lifestyle change: None Identified    MONITORING & EVALUATION Dietary intake, weekly physical activity, body weight, and goals in 3 months.  Next Steps Patient is to follow-up in 3 months for 9 month post-op follow-up.

## 2020-03-13 NOTE — Patient Instructions (Addendum)
It was great to see you again today!  I will be in touch with your labs asap We will see if you are immune to covid 19- if not I would encourage you to get the covid 19 vaccine Flu shot given today

## 2020-03-13 NOTE — Progress Notes (Addendum)
[place] at [facility] [address], [place], [place] [address] [phone]  Date:  03/15/2020   Name:  Steven Hodge   DOB:  19-Aug-1960   MRN:  [id number]  PCP:  Darreld Mclean, MD    Chief Complaint: Diabetes (4 month follow up, flu shot)   History of Present Illness:  Steven Hodge is a 59 y.o. very pleasant male patient who presents with the following:  Here today for periodic follow-up visit, last seen by myself in June-history of hyperlipidemia, diabetes, hypertension, gout He underwent gastric bypass surgery in April of this year  COVID-19 series- pt thinks he had covid 09/2018.  We discussed immunization- he has concerns. Will get an ab level for him today to determine if he is immune Flu vaccine- given  Eye exam A1c due today Likely needs maintenance labs- will do today He went back on tumeric and coq10 which is helping him with joint pains  Lab Results  Component Value Date   HGBA1C 7.6 (H) 09/29/2019    Wt Readings from Last 3 Encounters:  03/15/20 222 lb (100.7 kg)  03/12/20 219 lb 9.6 oz (99.6 kg)  01/11/20 222 lb (100.7 kg)   He is not vomiting - he does have some gas and burping at times He is down about 50 lbs since his surgery- however down about 125 lbs from his highest weight Glucose checks at home running 80- 127 Patient Active Problem List   Diagnosis Date Noted  . H/O gastric bypass 10/16/2019  . Severe obesity ([place]) 10/03/2019  . Fatty liver 07/21/2019  . Other hyperlipidemia 05/07/2019  . Type 2 diabetes mellitus with hyperglycemia, without long-term current use of insulin ([place]) 07/06/2017  . 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  . Vitamin D deficiency 11/18/2016  . Essential hypertension 09/18/2016  . History of colonic polyps 08/20/2016    Past Medical History:  Diagnosis Date  . Arthritis    knees shoulders hands  . Back pain   . Cancer ([place]) 06/09/2006    Basal cell carcinoma scalp;   [name] Diabetes mellitus without complication ([place])   . Gout   . Hx of blood clots   . Hypertension     Past Surgical History:  Procedure Laterality Date  . basel cell cancer removed  2009  . GASTRIC ROUX-EN-Y N/A 10/03/2019   Procedure: LAPAROSCOPIC ROUX-EN-Y GASTRIC BYPASS, HIATAL HERNIA REPARI, WITH UPPER ENDOSCOPY, ERAS Pathway;  Surgeon: Greer Pickerel, MD;  Location: [facility];  Service: General;  Laterality: N/A;  . KNEE SURGERY  2005   ACL repair R  . UMBILICAL HERNIA REPAIR  10/03/2019   Procedure: HERNIA REPAIR UMBILICAL ADULT;  Surgeon: Greer Pickerel, MD;  Location: [facility];  Service: General;;    Social History   Tobacco Use  . Smoking status: Former Research scientist (life sciences)  . Smokeless tobacco: Never Used  Vaping Use  . Vaping Use: Never used  Substance Use Topics  . Alcohol use: No  . Drug use: No    Family History  Problem Relation Age of Onset  . Diabetes Mother   . Liver disease Mother   . Obesity Mother   . Cancer Father 6       lung cancer  . Diabetes Father   . Cancer Sister 55       lung cancer  . Heart disease Brother 64       cardiac stenting/CAD  .  Cancer Brother        skin cancer  . Arthritis Brother     No Known Allergies  Medication list has been reviewed and updated.  Current Outpatient Medications on File Prior to Visit  Medication Sig Dispense Refill  . allopurinol (ZYLOPRIM) 300 MG tablet Take 1 tablet (300 mg total) by mouth daily. 90 tablet 3  . blood glucose meter kit and supplies Dispense based on patient and insurance preference. Use up to four times daily as directed. (FOR ICD-9 250.00, 250.01). 1 each 0  . glucose blood (CONTOUR NEXT TEST) test strip Use as instructed 100 strip 0  . Multiple Vitamin (MULTIVITAMIN) tablet Take 1 tablet by mouth daily.    . Turmeric 400 MG CAPS Take 1 capsule by mouth daily.    [name] lisinopril (ZESTRIL) 5 MG tablet Take 1 tablet (5 mg total) by mouth daily. (Patient not taking: Reported on  03/15/2020) 90 tablet 0   No current facility-administered medications on file prior to visit.    Review of Systems:  As per HPI- otherwise negative.   Physical Examination: Vitals:   03/15/20 1551  BP: 124/82  Pulse: 61  Resp: 17  SpO2: 97%   Vitals:   03/15/20 1551  Weight: 222 lb (100.7 kg)  Height: '5\' 8"'  (1.727 m)   Body mass index is 33.75 kg/m. Ideal Body Weight: Weight in (lb) to have BMI = 25: 164.1  GEN: no acute distress. HEENT: Atraumatic, Normocephalic.  Ears and Nose: No external deformity. CV: RRR, No M/G/R. No JVD. No thrill. No extra heart sounds. PULM: CTA B, no wheezes, crackles, rhonchi. No retractions. No resp. distress. No accessory muscle use. ABD: S, NT, ND, +BS. No rebound. No HSM. EXTR: No c/c/e PSYCH: Normally interactive. Conversant.    Assessment and Plan: H/O gastric bypass - Plan: TSH, Vitamin B12, Ferritin, Folate  Elevated liver function tests - Plan: Comprehensive metabolic panel  Type 2 diabetes mellitus with hyperglycemia, without long-term current use of insulin (HCC) - Plan: Hemoglobin A1c  Other hyperlipidemia - Plan: Lipid panel  Vitamin D deficiency - Plan: VITAMIN D 25 Hydroxy (Vit-D Deficiency, Fractures)  Essential hypertension - Plan: CBC, Comprehensive metabolic panel  Fatty liver - Plan: Comprehensive metabolic panel  Needs flu shot - Plan: Flu Vaccine QUAD 6+ mos PF IM (Fluarix Quad PF)  Exposure to COVID-19 virus - Plan: SAR CoV2 Serology (COVID 19)AB(IGG)IA  Following up today Labs for gastric bypass He has stopped taking lisinopril- BP looks fine Flu shot given today Will plan further follow- up pending labs.  This visit occurred during the SARS-CoV-2 public health emergency.  Safety protocols were in place, including screening questions prior to the visit, additional usage of staff PPE, and extensive cleaning of exam room while observing appropriate contact time as indicated for disinfecting solutions.     Signed Lamar Blinks, MD  Addendum 10/8, received labs as below Results for orders placed or performed in visit on 03/15/20  Hemoglobin A1c  Result Value Ref Range   Hgb A1c MFr Bld 5.7 (H) <5.7 % of total Hgb   Mean Plasma Glucose 117 (calc)   eAG (mmol/L) 6.5 (calc)  CBC  Result Value Ref Range   WBC 6.9 3.8 - 10.8 Thousand/uL   RBC 4.57 4.20 - 5.80 Million/uL   Hemoglobin 14.6 13.2 - 17.1 g/dL   HCT 41.8 38 - 50 %   MCV 91.5 80.0 - 100.0 fL   MCH 31.9 27.0 - 33.0 pg  MCHC 34.9 32.0 - 36.0 g/dL   RDW 12.3 11.0 - 15.0 %   Platelets 179 140 - 400 Thousand/uL   MPV 12.6 (H) 7.5 - 12.5 fL  Comprehensive metabolic panel  Result Value Ref Range   Glucose, Bld 89 65 - 99 mg/dL   BUN 22 7 - 25 mg/dL   Creat 0.79 0.70 - 1.33 mg/dL   BUN/Creatinine Ratio NOT APPLICABLE 6 - 22 (calc)   Sodium 143 135 - 146 mmol/L   Potassium 4.3 3.5 - 5.3 mmol/L   Chloride 105 98 - 110 mmol/L   CO2 24 20 - 32 mmol/L   Calcium 8.9 8.6 - 10.3 mg/dL   Total Protein 6.3 6.1 - 8.1 g/dL   Albumin 4.0 3.6 - 5.1 g/dL   Globulin 2.3 1.9 - 3.7 g/dL (calc)   AG Ratio 1.7 1.0 - 2.5 (calc)   Total Bilirubin 0.4 0.2 - 1.2 mg/dL   Alkaline phosphatase (APISO) 91 35 - 144 U/L   AST 21 10 - 35 U/L   ALT 25 9 - 46 U/L  Lipid panel  Result Value Ref Range   Cholesterol 151 <200 mg/dL   HDL 48 > OR = 40 mg/dL   Triglycerides 59 <150 mg/dL   LDL Cholesterol (Calc) 89 mg/dL (calc)   Total CHOL/HDL Ratio 3.1 <5.0 (calc)   Non-HDL Cholesterol (Calc) 103 <130 mg/dL (calc)  TSH  Result Value Ref Range   TSH 1.62 0.40 - 4.50 mIU/L  Vitamin B12  Result Value Ref Range   Vitamin B-12 398 200 - 1,100 pg/mL  VITAMIN D 25 Hydroxy (Vit-D Deficiency, Fractures)  Result Value Ref Range   Vit D, 25-Hydroxy 47 30 - 100 ng/mL  Ferritin  Result Value Ref Range   Ferritin 104 38 - 380 ng/mL  Folate  Result Value Ref Range   Folate 20.0 ng/mL

## 2020-03-15 ENCOUNTER — Ambulatory Visit: Payer: BC Managed Care – PPO | Admitting: Family Medicine

## 2020-03-15 ENCOUNTER — Encounter: Payer: Self-pay | Admitting: Family Medicine

## 2020-03-15 ENCOUNTER — Other Ambulatory Visit: Payer: Self-pay

## 2020-03-15 VITALS — BP 124/82 | HR 61 | Resp 17 | Ht 68.0 in | Wt 222.0 lb

## 2020-03-15 DIAGNOSIS — Z20822 Contact with and (suspected) exposure to covid-19: Secondary | ICD-10-CM

## 2020-03-15 DIAGNOSIS — E559 Vitamin D deficiency, unspecified: Secondary | ICD-10-CM | POA: Diagnosis not present

## 2020-03-15 DIAGNOSIS — Z23 Encounter for immunization: Secondary | ICD-10-CM

## 2020-03-15 DIAGNOSIS — R7989 Other specified abnormal findings of blood chemistry: Secondary | ICD-10-CM

## 2020-03-15 DIAGNOSIS — Z9884 Bariatric surgery status: Secondary | ICD-10-CM | POA: Diagnosis not present

## 2020-03-15 DIAGNOSIS — I10 Essential (primary) hypertension: Secondary | ICD-10-CM

## 2020-03-15 DIAGNOSIS — E7849 Other hyperlipidemia: Secondary | ICD-10-CM | POA: Diagnosis not present

## 2020-03-15 DIAGNOSIS — E1165 Type 2 diabetes mellitus with hyperglycemia: Secondary | ICD-10-CM | POA: Diagnosis not present

## 2020-03-15 DIAGNOSIS — K760 Fatty (change of) liver, not elsewhere classified: Secondary | ICD-10-CM

## 2020-03-16 ENCOUNTER — Encounter: Payer: Self-pay | Admitting: Family Medicine

## 2020-03-16 LAB — COMPREHENSIVE METABOLIC PANEL
AG Ratio: 1.7 (calc) (ref 1.0–2.5)
ALT: 25 U/L (ref 9–46)
AST: 21 U/L (ref 10–35)
Albumin: 4 g/dL (ref 3.6–5.1)
Alkaline phosphatase (APISO): 91 U/L (ref 35–144)
BUN: 22 mg/dL (ref 7–25)
CO2: 24 mmol/L (ref 20–32)
Calcium: 8.9 mg/dL (ref 8.6–10.3)
Chloride: 105 mmol/L (ref 98–110)
Creat: 0.79 mg/dL (ref 0.70–1.33)
Globulin: 2.3 g/dL (calc) (ref 1.9–3.7)
Glucose, Bld: 89 mg/dL (ref 65–99)
Potassium: 4.3 mmol/L (ref 3.5–5.3)
Sodium: 143 mmol/L (ref 135–146)
Total Bilirubin: 0.4 mg/dL (ref 0.2–1.2)
Total Protein: 6.3 g/dL (ref 6.1–8.1)

## 2020-03-16 LAB — HEMOGLOBIN A1C
Hgb A1c MFr Bld: 5.7 % of total Hgb — ABNORMAL HIGH (ref <5.7)
Mean Plasma Glucose: 117 (calc)
eAG (mmol/L): 6.5 (calc)

## 2020-03-16 LAB — LIPID PANEL
Cholesterol: 151 mg/dL (ref <200)
HDL: 48 mg/dL (ref >=40)
LDL Cholesterol (Calc): 89 mg/dL (calc)
Non-HDL Cholesterol (Calc): 103 mg/dL (calc) (ref <130)
Total CHOL/HDL Ratio: 3.1 (calc) (ref <5.0)
Triglycerides: 59 mg/dL (ref <150)

## 2020-03-16 LAB — CBC
HCT: 41.8 % (ref 38.5–50.0)
Hemoglobin: 14.6 g/dL (ref 13.2–17.1)
MCH: 31.9 pg (ref 27.0–33.0)
MCHC: 34.9 g/dL (ref 32.0–36.0)
MCV: 91.5 fL (ref 80.0–100.0)
MPV: 12.6 fL — ABNORMAL HIGH (ref 7.5–12.5)
Platelets: 179 10*3/uL (ref 140–400)
RBC: 4.57 10*6/uL (ref 4.20–5.80)
RDW: 12.3 % (ref 11.0–15.0)
WBC: 6.9 10*3/uL (ref 3.8–10.8)

## 2020-03-16 LAB — FERRITIN: Ferritin: 104 ng/mL (ref 38–380)

## 2020-03-16 LAB — SARS-COV-2 ANTIBODY(IGG)SPIKE,SEMI-QUANTITATIVE: SARS COV1 AB(IGG)SPIKE,SEMI QN: <1 index (ref <1.00)

## 2020-03-16 LAB — VITAMIN B12: Vitamin B-12: 398 pg/mL (ref 200–1100)

## 2020-03-16 LAB — VITAMIN D 25 HYDROXY (VIT D DEFICIENCY, FRACTURES): Vit D, 25-Hydroxy: 47 ng/mL (ref 30–100)

## 2020-03-16 LAB — FOLATE: Folate: 20 ng/mL

## 2020-03-16 LAB — TSH: TSH: 1.62 mIU/L (ref 0.40–4.50)

## 2020-04-11 DIAGNOSIS — E669 Obesity, unspecified: Secondary | ICD-10-CM | POA: Diagnosis not present

## 2020-04-11 DIAGNOSIS — E119 Type 2 diabetes mellitus without complications: Secondary | ICD-10-CM | POA: Diagnosis not present

## 2020-04-11 DIAGNOSIS — I10 Essential (primary) hypertension: Secondary | ICD-10-CM | POA: Diagnosis not present

## 2020-04-11 DIAGNOSIS — Z8639 Personal history of other endocrine, nutritional and metabolic disease: Secondary | ICD-10-CM | POA: Diagnosis not present

## 2020-04-19 DIAGNOSIS — H5213 Myopia, bilateral: Secondary | ICD-10-CM | POA: Diagnosis not present

## 2020-04-19 DIAGNOSIS — H524 Presbyopia: Secondary | ICD-10-CM | POA: Diagnosis not present

## 2020-04-19 DIAGNOSIS — H52203 Unspecified astigmatism, bilateral: Secondary | ICD-10-CM | POA: Diagnosis not present

## 2020-05-22 ENCOUNTER — Ambulatory Visit: Payer: BC Managed Care – PPO | Attending: Internal Medicine

## 2020-05-22 ENCOUNTER — Other Ambulatory Visit (HOSPITAL_BASED_OUTPATIENT_CLINIC_OR_DEPARTMENT_OTHER): Payer: Self-pay | Admitting: Internal Medicine

## 2020-05-22 DIAGNOSIS — Z23 Encounter for immunization: Secondary | ICD-10-CM

## 2020-05-22 NOTE — Progress Notes (Signed)
   Covid-19 Vaccination Clinic  Name:  AAYAN HASKEW    MRN: [id number] DOB: June 27, 1960  05/22/2020  Mr. Vences was observed post Covid-19 immunization for 15 minutes without incident. He was provided with Vaccine Information Sheet and instruction to access the V-Safe system.   Mr. Giuliani was instructed to call 911 with any severe reactions post vaccine: [name] Difficulty breathing  . Swelling of face and throat  . A fast heartbeat  . A bad rash all over body  . Dizziness and weakness   Immunizations Administered    Name Date Dose VIS Date Route   Moderna COVID-19 Vaccine 05/22/2020  2:23 PM 0.5 mL 03/28/2020 Intramuscular   Manufacturer: Levan Hurst   Lot: [id number]   [place]: [phone]

## 2020-05-28 MED FILL — MODERNA COVID-19 VACCINE 10: 100 | 1 days supply | Qty: 1 | Fill #0

## 2020-06-12 ENCOUNTER — Encounter: Payer: BC Managed Care – PPO | Attending: General Surgery | Admitting: Skilled Nursing Facility1

## 2020-06-12 ENCOUNTER — Other Ambulatory Visit: Payer: Self-pay

## 2020-06-12 DIAGNOSIS — E1165 Type 2 diabetes mellitus with hyperglycemia: Secondary | ICD-10-CM

## 2020-06-12 NOTE — Progress Notes (Signed)
Bariatric Nutrition Follow-Up Visit Medical Nutrition Therapy  Appt Start Time: 4:58pm   End Time: 5:25pm  Post-Operative RYGB Surgery Surgery Date: 09/30/2019  Pt's Expectations of Surgery/ Goals:  to help better control blood sugar, come off medications, improve health Pt Reported Successes: lower blood sugar, no diabetes or blood pressure meds    NUTRITION ASSESSMENT  Anthropometrics  Start weight at [facility]: 261.8 lbs (date: 09/07/2019) Today's weight: pt declined  Body Composition Scale 10/19/2019 11/25/2019 03/12/2020  Weight  lbs 245 224.8 219.6  BMI 37.3 34.1 33.3  Total Body Fat  % 32.9 29.9 29.1     Visceral Fat 25 21 20   Fat-Free Mass  % 67 70 70.8     Total Body Water  % 48 51 51.8     Muscle-Mass  lbs 46.1 42.3 41.3    Lifestyle & Dietary Hx  Pt states he is off all prescription medications. Pt states he struggles to get in enough fluids throughout the day. Pt states his upper gas is still present but seems to be getting better. Pt states he does get nausea sometimes due to gas. 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  Pt states he is not getting anything out of these apportionments so he does not wish to have an further follow up   24-Hr Dietary Recall: 1 protein shake every day First Meal: egg + bacon + maybe toast  Snack: beef jerky or nuts Second Meal: protein + veggie  Snack: protein shake (or nuts, or beef jerky)  Third Meal: spaghetti w/ meat sauce & whole wheat noodles  Snack: nuts Beverages:  decaf coffee, water, sugar-free flavor packets, zero-calorie soft drink, Gatorade Zero  Estimated daily fluid intake: 60 oz Estimated daily protein intake: 80+ g Supplements: bariatric MVI, Tums, tumeric, CoQ10, metamucil  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  Post-Op Goals/ Signs/ Symptoms Using straws: rarely Drinking while eating: rarely Chewing/swallowing difficulties: no Changes in vision: no Changes to mood/headaches: no Hair loss/changes to skin/nails: no Difficulty focusing/concentrating: no Sweating: no Dizziness/lightheadedness: no Palpitations: no  Carbonated/caffeinated beverages: no N/V/D/C/Gas: upper gas Abdominal pain: no Dumping syndrome: no   NUTRITION DIAGNOSIS  Overweight/obesity ([place]-3.3) related to past poor dietary habits and physical inactivity as evidenced by completed bariatric surgery and following dietary guidelines for continued weight loss and healthy nutrition status.   NUTRITION INTERVENTION Nutrition counseling (C-1) and education (E-2) to facilitate bariatric surgery goals, including: . Diet advancement to the next phase fruit . The importance of consuming adequate calories as well as certain nutrients daily due to the body's need for essential vitamins, minerals, and fats  Handouts Provided Include    Learning Style & Readiness for Change Teaching method utilized: Visual & Auditory  Demonstrated degree of understanding via: Teach Back  Barriers to learning/adherence to lifestyle change: None Identified    MONITORING & EVALUATION Dietary intake, weekly physical activity, body weight

## 2020-06-19 ENCOUNTER — Ambulatory Visit: Payer: BC Managed Care – PPO | Attending: Internal Medicine

## 2020-06-19 ENCOUNTER — Other Ambulatory Visit (HOSPITAL_BASED_OUTPATIENT_CLINIC_OR_DEPARTMENT_OTHER): Payer: Self-pay | Admitting: Internal Medicine

## 2020-06-19 DIAGNOSIS — Z23 Encounter for immunization: Secondary | ICD-10-CM

## 2020-06-19 MED FILL — MODERNA COVID-19 VACCINE 10: 100 | 28 days supply | Qty: 1 | Fill #0

## 2020-06-19 NOTE — Progress Notes (Signed)
   Covid-19 Vaccination Clinic  Name:  Steven Hodge    MRN: [id number] DOB: 05/26/1961  06/19/2020  [name] was observed post Covid-19 immunization for 15 minutes without incident. He was provided with Vaccine Information Sheet and instruction to access the V-Safe system.   [name] was instructed to call 911 with any severe reactions post vaccine: [name] Difficulty breathing  . Swelling of face and throat  . A fast heartbeat  . A bad rash all over body  . Dizziness and weakness   Immunizations Administered    Name Date Dose VIS Date Route   Moderna COVID-19 Vaccine 06/19/2020  2:49 PM 0.5 mL 03/28/2020 Intramuscular   Manufacturer: Levan Hurst   Lot: [id number]   [place]: [phone]

## 2020-08-28 ENCOUNTER — Encounter: Payer: Self-pay | Admitting: Family Medicine

## 2020-09-28 NOTE — Progress Notes (Addendum)
[place] at [facility] [address] [place], [place] [address] [phone]  Date:  10/01/2020   Name:  Steven Hodge   DOB:  10-May-1961   MRN:  [id number]  PCP:  Darreld Mclean, MD    Chief Complaint: Diabetes and Hypertension   History of Present Illness:  Steven Hodge is a 60 y.o. very pleasant male patient who presents with the following:  Here today for follow-up visit.  Last seen by myself in October History of hyperlipidemia, diabetes, hypertension, gout He underwent gastric bypass surgery in April of this year  Since his gastric bypass he has lost approx 45 lbs Weight has now stabilized He still has trouble with eating some days- not always.   Wt Readings from Last 3 Encounters:  10/01/20 222 lb (100.7 kg)  03/15/20 222 lb (100.7 kg)  03/12/20 219 lb 9.6 oz (99.6 kg)   Urine microalbumin due Eye exam- will do this summer Foot exam coming due Shingles vaccine- discussed today, will start  COVID-19 vaccine-at last visit he was concerned about getting vaccinated.  We did do an antibody level which was negative for continued antibiotic protection-he has since gotten 2 doses of vaccine! His arm hurt after the vaccine    Allopurinol- no longer taking, no recent gout sx  Glucose running near 100  At last visit, his A1c was 5.7%  He walks 2-3x a week and is active in his work   History of skin cancer- he sees derm annually  Patient Active Problem List   Diagnosis Date Noted  . H/O gastric bypass 10/16/2019  . Severe obesity ([place]) 10/03/2019  . Fatty liver 07/21/2019  . Other hyperlipidemia 05/07/2019  . Type 2 diabetes mellitus with hyperglycemia, without long-term current use of insulin ([place]) 07/06/2017  . 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  . Vitamin D deficiency 11/18/2016  . Essential hypertension 09/18/2016  . History of colonic polyps 08/20/2016    Past  Medical History:  Diagnosis Date  . Arthritis    knees shoulders hands  . Back pain   . Cancer ([place]) 06/09/2006   Basal cell carcinoma scalp;   [name] Diabetes mellitus without complication ([place])   . Gout   . Hx of blood clots   . Hypertension     Past Surgical History:  Procedure Laterality Date  . basel cell cancer removed  2009  . GASTRIC ROUX-EN-Y N/A 10/03/2019   Procedure: LAPAROSCOPIC ROUX-EN-Y GASTRIC BYPASS, HIATAL HERNIA REPARI, WITH UPPER ENDOSCOPY, ERAS Pathway;  Surgeon: Greer Pickerel, MD;  Location: [facility];  Service: General;  Laterality: N/A;  . KNEE SURGERY  2005   ACL repair R  . UMBILICAL HERNIA REPAIR  10/03/2019   Procedure: HERNIA REPAIR UMBILICAL ADULT;  Surgeon: Greer Pickerel, MD;  Location: [facility];  Service: General;;    Social History   Tobacco Use  . Smoking status: Former Research scientist (life sciences)  . Smokeless tobacco: Never Used  Vaping Use  . Vaping Use: Never used  Substance Use Topics  . Alcohol use: No  . Drug use: No    Family History  Problem Relation Age of Onset  . Diabetes Mother   . Liver disease Mother   . Obesity Mother   . Cancer Father 39       lung cancer  . Diabetes Father   . Cancer Sister 59       lung cancer  .  Heart disease Brother 33       cardiac stenting/CAD  . Cancer Brother        skin cancer  . Arthritis Brother     No Known Allergies  Medication list has been reviewed and updated.  Current Outpatient Medications on File Prior to Visit  Medication Sig Dispense Refill  . blood glucose meter kit and supplies Dispense based on patient and insurance preference. Use up to four times daily as directed. (FOR ICD-9 250.00, 250.01). 1 each 0  . Coenzyme Q10 (COQ-10 PO) Take by mouth daily.    [name] glucose blood (CONTOUR NEXT TEST) test strip Use as instructed 100 strip 0  . Multiple Vitamin (MULTIVITAMIN) tablet Take 1 tablet by mouth daily.    . Turmeric 400 MG CAPS Take 1 capsule by mouth daily.     No current facility-administered  medications on file prior to visit.    Review of Systems:  As per HPI- otherwise negative.   Physical Examination: Vitals:   10/01/20 0826  BP: 126/68  Pulse: 68  Resp: 16  Temp: 97.6 F (36.4 C)  SpO2: 98%   Vitals:   10/01/20 0826  Weight: 222 lb (100.7 kg)  Height: $Remove'5\' 8"'vAFDfcq$  (1.727 m)   Body mass index is 33.75 kg/m. Ideal Body Weight: Weight in (lb) to have BMI = 25: 164.1  GEN: no acute distress.  Overweight but has lost, looks well  HEENT: Atraumatic, Normocephalic.  Ears and Nose: No external deformity. CV: RRR, No M/G/R. No JVD. No thrill. No extra heart sounds. PULM: CTA B, no wheezes, crackles, rhonchi. No retractions. No resp. distress. No accessory muscle use. ABD: S, NT, ND, +BS. No rebound. No HSM. EXTR: No c/c/e PSYCH: Normally interactive. Conversant.  Foot exam- normal today  Wound right arm- tetanus UTD   Assessment and Plan: H/O gastric bypass - Plan: VITAMIN D 25 Hydroxy (Vit-D Deficiency, Fractures), Vitamin B12, Ferritin  Elevated liver function tests - Plan: Comprehensive metabolic panel  Type 2 diabetes mellitus with hyperglycemia, without long-term current use of insulin (HCC) - Plan: Microalbumin / creatinine urine ratio, Hemoglobin A1c, glucose blood (CONTOUR NEXT TEST) test strip  Other hyperlipidemia  Essential hypertension - Plan: CBC  Idiopathic chronic gout of multiple sites with tophus - Plan: Uric acid  Immunization due - Plan: Varicella-zoster vaccine IM (Shingrix), CANCELED: Varicella-zoster vaccine IM (Shingrix)  Here today for a follow-up visit  Start shingrix Encourage covid booster BP under good control Labs for gastric bypass.   He is seeing his surgeon next week for one year post- surgery visit DM is under good control diet only right now Will plan further follow- up pending labs.  This visit occurred during the SARS-CoV-2 public health emergency.  Safety protocols were in place, including screening questions prior to  the visit, additional usage of staff PPE, and extensive cleaning of exam room while observing appropriate contact time as indicated for disinfecting solutions.    Signed Lamar Blinks, MD   Received labs as below, message to patient Results for orders placed or performed in visit on 10/01/20  CBC  Result Value Ref Range   WBC 6.7 4.0 - 10.5 K/uL   RBC 4.94 4.22 - 5.81 Mil/uL   Platelets 161.0 150.0 - 400.0 K/uL   Hemoglobin 15.2 13.0 - 17.0 g/dL   HCT 44.1 39.0 - 52.0 %   MCV 89.4 78.0 - 100.0 fl   MCHC 34.6 30.0 - 36.0 g/dL   RDW 13.3 11.5 - 15.5 %  Comprehensive metabolic panel  Result Value Ref Range   Sodium 141 135 - 145 mEq/L   Potassium 4.4 3.5 - 5.1 mEq/L   Chloride 107 96 - 112 mEq/L   CO2 27 19 - 32 mEq/L   Glucose, Bld 113 (H) 70 - 99 mg/dL   BUN 16 6 - 23 mg/dL   Creatinine, Ser 0.82 0.40 - 1.50 mg/dL   Total Bilirubin 0.7 0.2 - 1.2 mg/dL   Alkaline Phosphatase 90 39 - 117 U/L   AST 26 0 - 37 U/L   ALT 37 0 - 53 U/L   Total Protein 6.8 6.0 - 8.3 g/dL   Albumin 4.1 3.5 - 5.2 g/dL   GFR 97.49 >60.00 mL/min   Calcium 9.2 8.4 - 10.5 mg/dL  Uric acid  Result Value Ref Range   Uric Acid, Serum 7.0 4.0 - 7.8 mg/dL  Microalbumin / creatinine urine ratio  Result Value Ref Range   Microalb, Ur 1.0 0.0 - 1.9 mg/dL   Creatinine,U 144.1 mg/dL   Microalb Creat Ratio 0.7 0.0 - 30.0 mg/g  Hemoglobin A1c  Result Value Ref Range   Hgb A1c MFr Bld 5.9 4.6 - 6.5 %  VITAMIN D 25 Hydroxy (Vit-D Deficiency, Fractures)  Result Value Ref Range   VITD 31.77 30.00 - 100.00 ng/mL  Vitamin B12  Result Value Ref Range   Vitamin B-12 858 211 - 911 pg/mL  Ferritin  Result Value Ref Range   Ferritin 130.5 22.0 - 322.0 ng/mL

## 2020-09-28 NOTE — Patient Instructions (Addendum)
It was very nice to see you again today, I will be in touch with your labs as soon as possible  Thank you for getting vaccinated against COVID, I think this was a really smart decision! I would also recommend getting a booster at your convenience  First shingles vaccine today- we can do your 2nd dose in 6 months Please see me in about 6 months assuming all is well

## 2020-10-01 ENCOUNTER — Other Ambulatory Visit: Payer: Self-pay

## 2020-10-01 ENCOUNTER — Ambulatory Visit: Payer: BC Managed Care – PPO | Admitting: Family Medicine

## 2020-10-01 ENCOUNTER — Other Ambulatory Visit (HOSPITAL_COMMUNITY): Payer: Self-pay

## 2020-10-01 ENCOUNTER — Encounter: Payer: Self-pay | Admitting: Family Medicine

## 2020-10-01 VITALS — BP 126/68 | HR 68 | Temp 97.6°F | Resp 16 | Ht 68.0 in | Wt 222.0 lb

## 2020-10-01 DIAGNOSIS — Z23 Encounter for immunization: Secondary | ICD-10-CM

## 2020-10-01 DIAGNOSIS — M1A09X1 Idiopathic chronic gout, multiple sites, with tophus (tophi): Secondary | ICD-10-CM | POA: Diagnosis not present

## 2020-10-01 DIAGNOSIS — R7989 Other specified abnormal findings of blood chemistry: Secondary | ICD-10-CM

## 2020-10-01 DIAGNOSIS — I10 Essential (primary) hypertension: Secondary | ICD-10-CM

## 2020-10-01 DIAGNOSIS — E7849 Other hyperlipidemia: Secondary | ICD-10-CM | POA: Diagnosis not present

## 2020-10-01 DIAGNOSIS — E1165 Type 2 diabetes mellitus with hyperglycemia: Secondary | ICD-10-CM

## 2020-10-01 DIAGNOSIS — Z9884 Bariatric surgery status: Secondary | ICD-10-CM | POA: Diagnosis not present

## 2020-10-01 LAB — COMPREHENSIVE METABOLIC PANEL
ALT: 37 U/L (ref 0–53)
AST: 26 U/L (ref 0–37)
Albumin: 4.1 g/dL (ref 3.5–5.2)
Alkaline Phosphatase: 90 U/L (ref 39–117)
BUN: 16 mg/dL (ref 6–23)
CO2: 27 mEq/L (ref 19–32)
Calcium: 9.2 mg/dL (ref 8.4–10.5)
Chloride: 107 mEq/L (ref 96–112)
Creatinine, Ser: 0.82 mg/dL (ref 0.40–1.50)
GFR: 97.49 mL/min (ref >60.00)
Glucose, Bld: 113 mg/dL — ABNORMAL HIGH (ref 70–99)
Potassium: 4.4 mEq/L (ref 3.5–5.1)
Sodium: 141 mEq/L (ref 135–145)
Total Bilirubin: 0.7 mg/dL (ref 0.2–1.2)
Total Protein: 6.8 g/dL (ref 6.0–8.3)

## 2020-10-01 LAB — URIC ACID: Uric Acid, Serum: 7 mg/dL (ref 4.0–7.8)

## 2020-10-01 LAB — HEMOGLOBIN A1C: Hgb A1c MFr Bld: 5.9 % (ref 4.6–6.5)

## 2020-10-01 LAB — CBC
HCT: 44.1 % (ref 39.0–52.0)
Hemoglobin: 15.2 g/dL (ref 13.0–17.0)
MCHC: 34.6 g/dL (ref 30.0–36.0)
MCV: 89.4 fl (ref 78.0–100.0)
Platelets: 161 10*3/uL (ref 150.0–400.0)
RBC: 4.94 Mil/uL (ref 4.22–5.81)
RDW: 13.3 % (ref 11.5–15.5)
WBC: 6.7 10*3/uL (ref 4.0–10.5)

## 2020-10-01 LAB — VITAMIN B12: Vitamin B-12: 858 pg/mL (ref 211–911)

## 2020-10-01 LAB — MICROALBUMIN / CREATININE URINE RATIO
Creatinine,U: 144.1 mg/dL
Microalb Creat Ratio: 0.7 mg/g (ref 0.0–30.0)
Microalb, Ur: 1 mg/dL (ref 0.0–1.9)

## 2020-10-01 LAB — FERRITIN: Ferritin: 130.5 ng/mL (ref 22.0–322.0)

## 2020-10-01 LAB — VITAMIN D 25 HYDROXY (VIT D DEFICIENCY, FRACTURES): VITD: 31.77 ng/mL (ref 30.00–100.00)

## 2020-10-01 MED ORDER — CONTOUR NEXT TEST VI STRP
ORAL_STRIP | 2 refills | Status: AC
  Filled 2020-10-01: qty 100, 90d supply, fill #0

## 2020-10-10 DIAGNOSIS — E669 Obesity, unspecified: Secondary | ICD-10-CM | POA: Diagnosis not present

## 2020-10-10 DIAGNOSIS — Z9884 Bariatric surgery status: Secondary | ICD-10-CM | POA: Diagnosis not present

## 2020-10-10 DIAGNOSIS — E119 Type 2 diabetes mellitus without complications: Secondary | ICD-10-CM | POA: Diagnosis not present

## 2020-10-10 DIAGNOSIS — Z8639 Personal history of other endocrine, nutritional and metabolic disease: Secondary | ICD-10-CM | POA: Diagnosis not present

## 2020-11-19 ENCOUNTER — Other Ambulatory Visit (HOSPITAL_COMMUNITY): Payer: Self-pay

## 2020-11-19 ENCOUNTER — Ambulatory Visit: Payer: BC Managed Care – PPO | Attending: Internal Medicine

## 2020-11-19 DIAGNOSIS — Z23 Encounter for immunization: Secondary | ICD-10-CM

## 2020-11-19 NOTE — Progress Notes (Signed)
   Covid-19 Vaccination Clinic  Name:  AYDEEN BLUME    MRN: [id number] DOB: 1961-04-19  11/19/2020  Mr. Guerrini was observed post Covid-19 immunization for 15 minutes without incident. He was provided with Vaccine Information Sheet and instruction to access the V-Safe system.   Mr. Foskey was instructed to call 911 with any severe reactions post vaccine: Difficulty breathing  Swelling of face and throat  A fast heartbeat  A bad rash all over body  Dizziness and weakness   Immunizations Administered     Name Date Dose VIS Date Route   Moderna Covid-19 Booster Vaccine 11/19/2020  2:20 PM 0.25 mL 03/28/2020 Intramuscular   Manufacturer: Moderna   Lot: [id number]   [place]: [phone]

## 2020-11-20 ENCOUNTER — Other Ambulatory Visit (HOSPITAL_BASED_OUTPATIENT_CLINIC_OR_DEPARTMENT_OTHER): Payer: Self-pay

## 2020-11-20 ENCOUNTER — Other Ambulatory Visit (HOSPITAL_COMMUNITY): Payer: Self-pay

## 2020-11-20 MED ORDER — COVID-19 MRNA VACC (MODERNA) 100 MCG/0.5ML IM SUSP
INTRAMUSCULAR | 0 refills | Status: DC
  Filled 2020-11-20: qty 0.25, 1d supply, fill #0

## 2021-01-04 ENCOUNTER — Other Ambulatory Visit (HOSPITAL_COMMUNITY): Payer: Self-pay

## 2021-02-27 IMAGING — CT CT RENAL STONE PROTOCOL
2 of 4 series · 16 of 46 positions shown, 18 images · non-contrast
Comparison: None.

CLINICAL DATA: 58-year-old male with flank pain. Concern for kidney
stone.

EXAM:
CT ABDOMEN AND PELVIS WITHOUT CONTRAST
TECHNIQUE: Multidetector CT imaging of the abdomen and pelvis was performed
following the standard protocol without IV contrast.

[Series 2: axial st · axial · 0.81mm/px · z∈[-571,-131]mm · 13 of 100 slices shown, 15 images]
[im 6/100  soft-tissue]
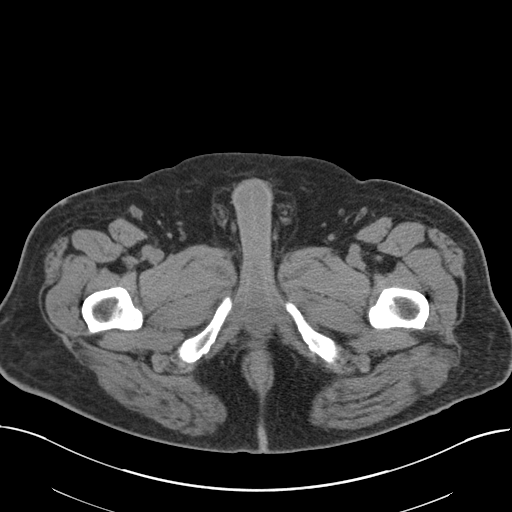
[im 6/100  bone]
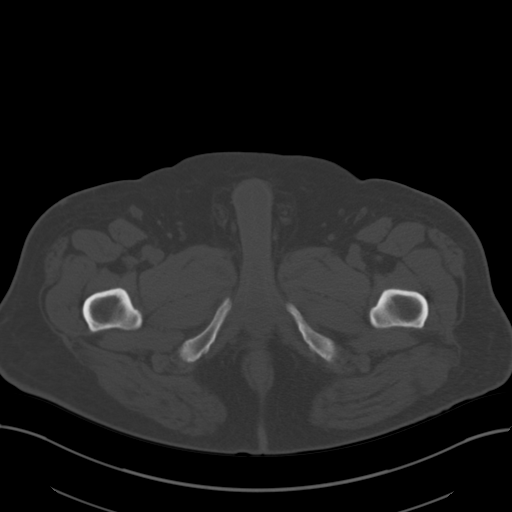
[im 12/100  soft-tissue]
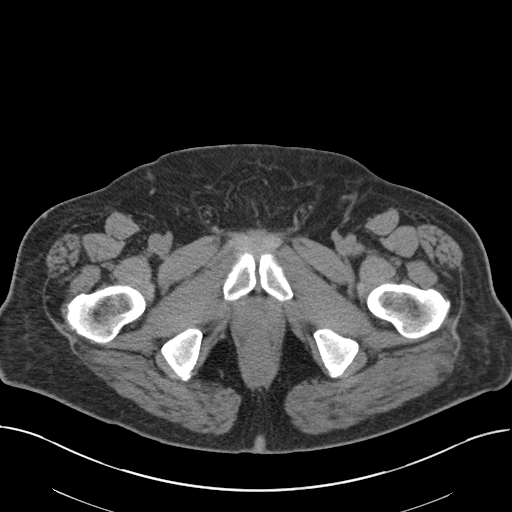
[im 23/100  soft-tissue]
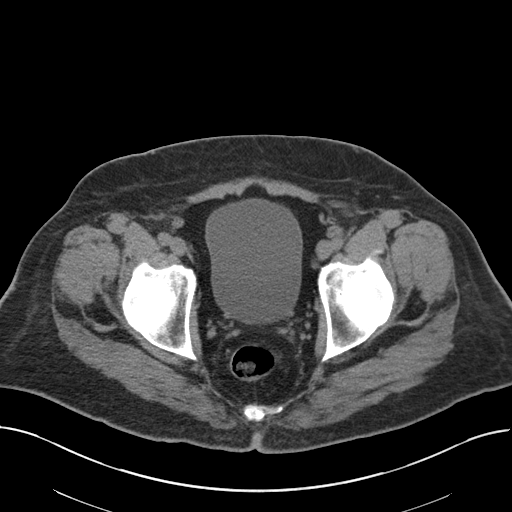
[im 28/100  soft-tissue]
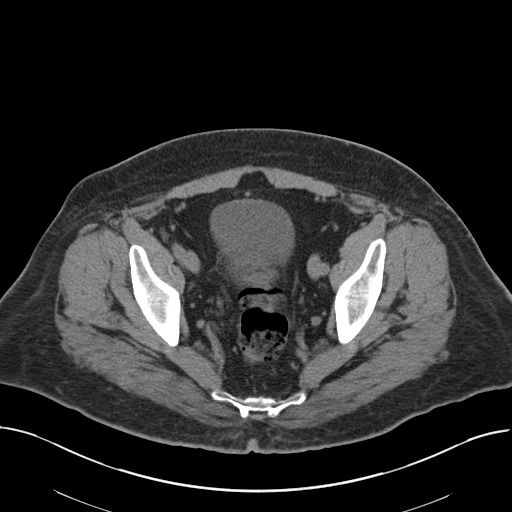
[im 34/100  soft-tissue]
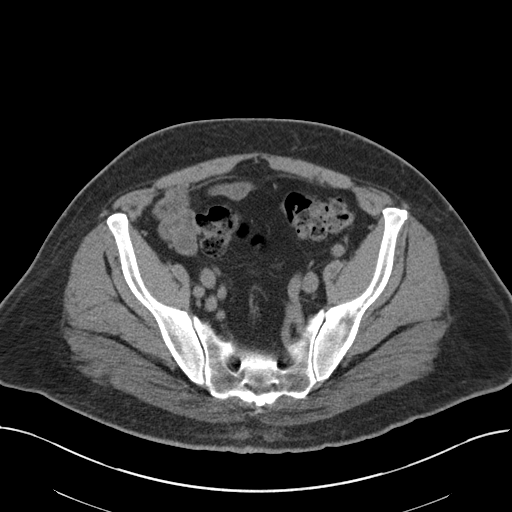
[im 45/100  soft-tissue]
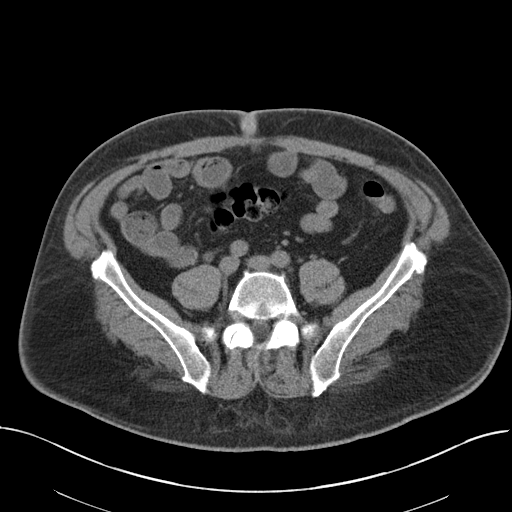
[im 50/100  soft-tissue]
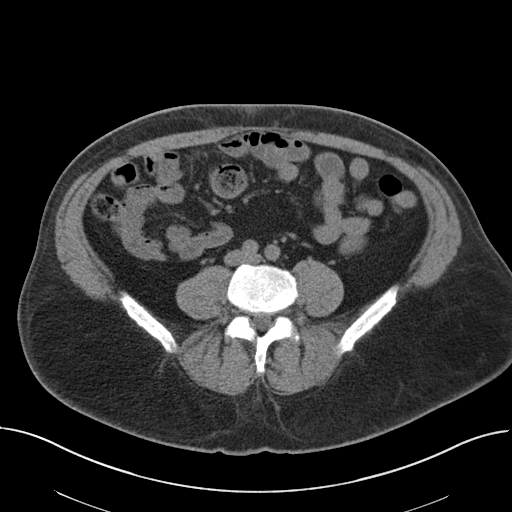
[im 56/100  soft-tissue]
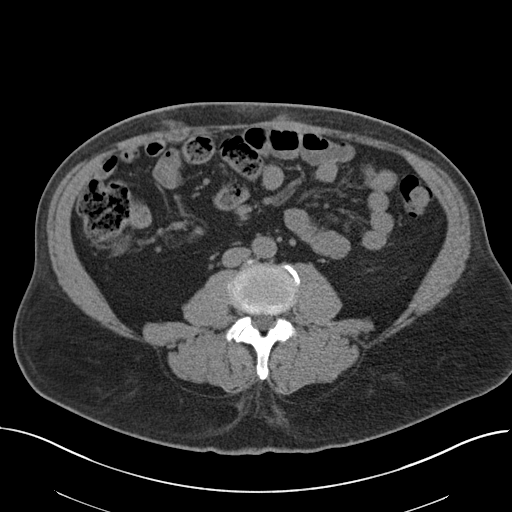
[im 67/100  soft-tissue]
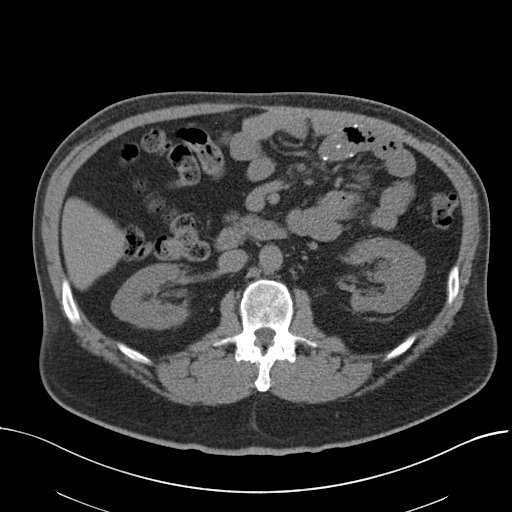
[im 67/100  bone]
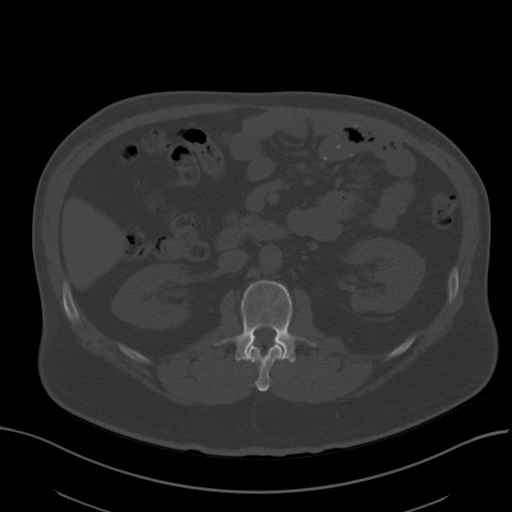
[im 72/100  soft-tissue]
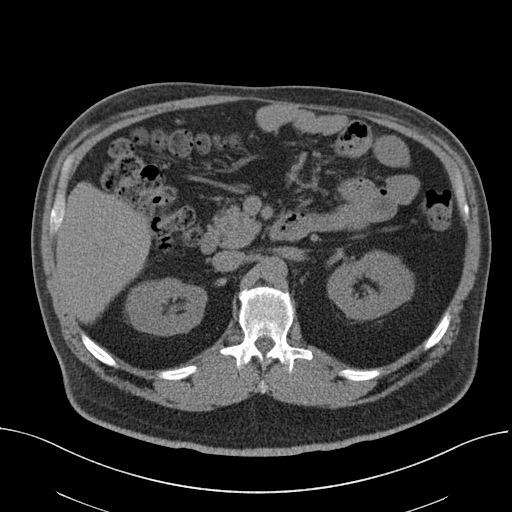
[im 78/100  soft-tissue]
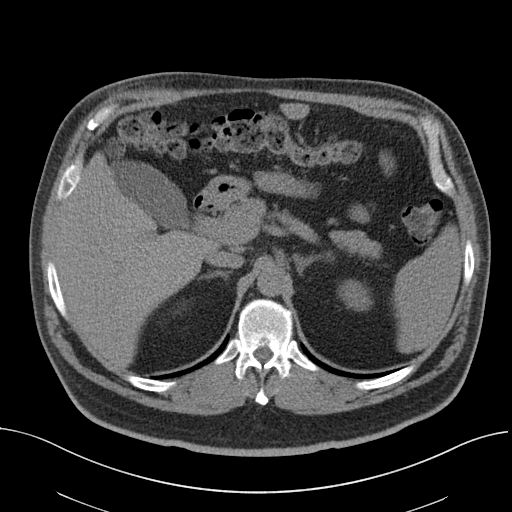
[im 89/100  soft-tissue]
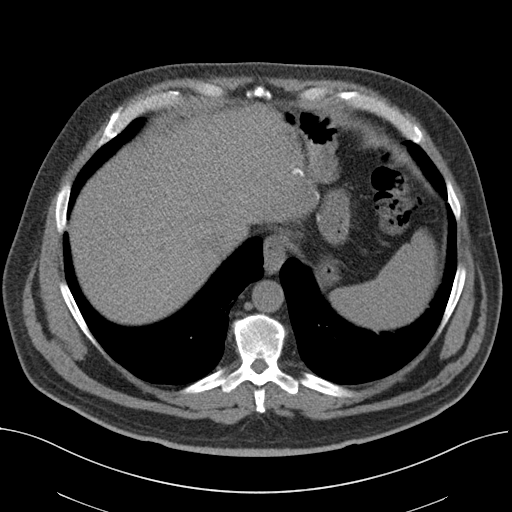
[im 94/100  soft-tissue]
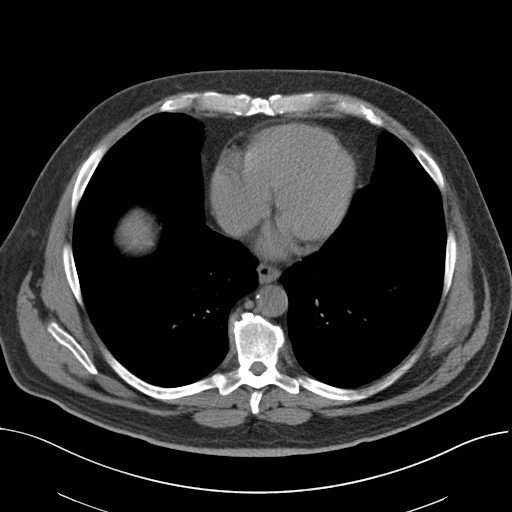

[Series 5: coronal · coronal · 0.83mm/px · 3 of 155 slices shown]
[im 52/155  soft-tissue]
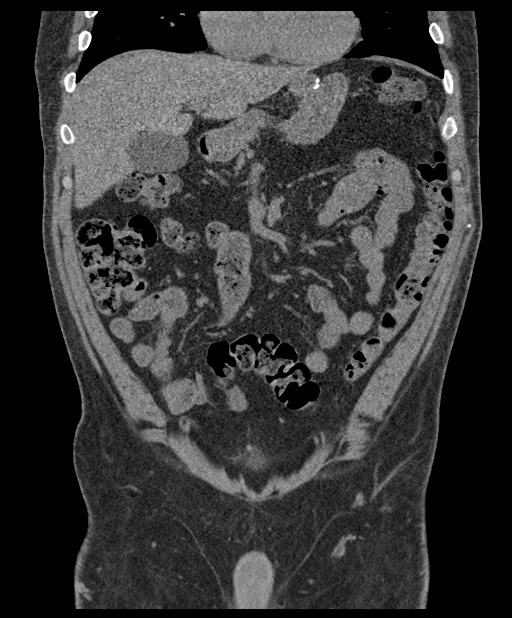
[im 69/155  soft-tissue]
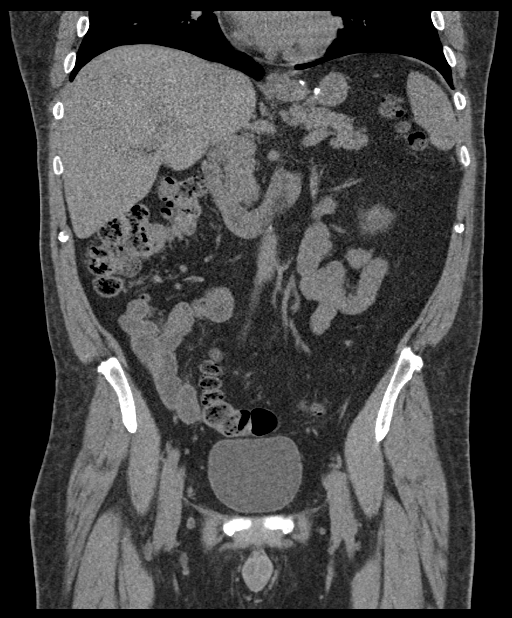
[im 86/155  soft-tissue]
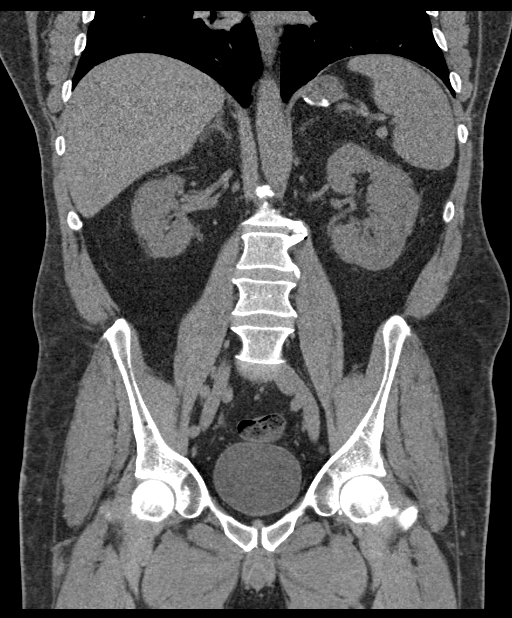

[16 of 46 positions shown; findings below may reference images not displayed]

FINDINGS: Evaluation of this exam is limited in the absence of intravenous
contrast.

Lower chest: The visualized lung bases are clear.

No intra-abdominal free air or free fluid.

Hepatobiliary: No focal liver abnormality is seen. No gallstones,
gallbladder wall thickening, or biliary dilatation.

Pancreas: Unremarkable. No pancreatic ductal dilatation or
surrounding inflammatory changes.

Spleen: Normal in size without focal abnormality.

Adrenals/Urinary Tract: The adrenal glands unremarkable. There is
bilateral renal cortical irregularity and scarring. There is a
punctate stone in the distal right ureter close to the
ureterovesical junction. There is mild right hydronephrosis. There
is no hydronephrosis or nephrolithiasis on the left. The urinary
bladder is grossly unremarkable.

Stomach/Bowel: Postsurgical changes of gastric bypass. There is
moderate stool throughout the colon. There is no bowel obstruction
or active inflammation. The appendix is normal.

Vascular/Lymphatic: Mild aortoiliac atherosclerotic disease. The IVC
is unremarkable. No portal venous gas. There is no adenopathy.

Reproductive: The prostate and seminal vesicles are grossly
unremarkable. No pelvic mass

Other: None

Musculoskeletal: No acute or significant osseous findings.
IMPRESSION: 1. A punctate distal right ureteral stone with mild right
hydronephrosis.
2. No bowel obstruction. Normal appendix.
3. Aortic Atherosclerosis ([id number]).

## 2021-03-01 DIAGNOSIS — L570 Actinic keratosis: Secondary | ICD-10-CM | POA: Diagnosis not present

## 2021-03-01 DIAGNOSIS — L918 Other hypertrophic disorders of the skin: Secondary | ICD-10-CM | POA: Diagnosis not present

## 2021-03-01 DIAGNOSIS — L821 Other seborrheic keratosis: Secondary | ICD-10-CM | POA: Diagnosis not present

## 2021-03-01 DIAGNOSIS — D225 Melanocytic nevi of trunk: Secondary | ICD-10-CM | POA: Diagnosis not present

## 2021-03-01 DIAGNOSIS — Z85828 Personal history of other malignant neoplasm of skin: Secondary | ICD-10-CM | POA: Diagnosis not present

## 2021-04-01 NOTE — Progress Notes (Addendum)
[facility] [place], [place], [place] [address] [phone]  Date:  04/03/2021   Name:  Steven Hodge   DOB:  1960/10/29   MRN:  [id number]  PCP:  Darreld Mclean, MD    Chief Complaint: 6 month follow up (Concerns/ questions: pt says he is due for Shingrix/Flu shot today: yes/)   History of Present Illness:  Steven Hodge is a 60 y.o. very pleasant male patient who presents with the following:  History of hyperlipidemia, diabetes, hypertension, gout He underwent gastric bypass surgery in April 2021 Patient seen today for 73-monthfollow-up visit Most recent visit with myself was in April of this year  Eye exam- will go in the next month or so  Pneumonia update- defer as we are doing shingirx #2 today  Can get second dose of Shingrix Flu vaccine- give today  COVID bivalent- recommend   A1c is improved into the prediabetes range since his weight loss surgery CMP, A1c, ferritin, vitamin D and B12, CBC completed in April Lab Results  Component Value Date   HGBA1C 5.9 10/01/2020   He is feeling well!   He has some difficulty after eating still but this is lessening He will take 2-3 bites and may feel nauseated.  Gas x does help him He is generally acitve day to day   Wt Readings from Last 3 Encounters:  04/03/21 221 lb 6.4 oz (100.4 kg)  10/01/20 222 lb (100.7 kg)  03/15/20 222 lb (100.7 kg)   His max weight was 325  - he did lost down to about 255 prior to his surgery  Patient Active Problem List   Diagnosis Date Noted   H/O gastric bypass 10/16/2019   Severe obesity ([place] 10/03/2019   Fatty liver 07/21/2019   Other hyperlipidemia 05/07/2019   Type 2 diabetes mellitus with hyperglycemia, without long-term current use of insulin ([place] 07/06/2017   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   Vitamin D deficiency 11/18/2016   Essential hypertension 09/18/2016   History of  colonic polyps 08/20/2016    Past Medical History:  Diagnosis Date   Arthritis    knees shoulders hands   Back pain    [place]   Basal cell carcinoma scalp;    Diabetes mellitus without complication (HCC)    Gout    Hx of blood clots    Hypertension     Past Surgical History:  Procedure Laterality Date   basel cell cancer removed  2009   GASTRIC ROUX-EN-Y N/A 10/03/2019   Procedure: LAPAROSCOPIC ROUX-EN-Y GASTRIC BYPASS, HIATAL HERNIA REPARI, WITH UPPER ENDOSCOPY, ERAS Pathway;  Surgeon: WGreer Pickerel MD;  Location: [facility];  Service: General;  Laterality: N/A;   KNEE SURGERY  2005   ACL repair R   UMBILICAL HERNIA REPAIR  10/03/2019   Procedure: HERNIA REPAIR UMBILICAL ADULT;  Surgeon: WGreer Pickerel MD;  Location: [facility];  Service: General;;    Social History   Tobacco Use   Smoking status: Former   Smokeless tobacco: Never  VScientific laboratory technicianUse: Never used  Substance Use Topics   Alcohol use: No   Drug use: No    Family History  Problem Relation Age of Onset   Diabetes Mother    Liver disease Mother    Obesity Mother    Cancer Father 789      lung cancer  Diabetes Father    Cancer Sister 64       lung cancer   Heart disease Brother 65       cardiac stenting/CAD   Cancer Brother        skin cancer   Arthritis Brother     No Known Allergies  Medication list has been reviewed and updated.  Current Outpatient Medications on File Prior to Visit  Medication Sig Dispense Refill   blood glucose meter kit and supplies Dispense based on patient and insurance preference. Use up to four times daily as directed. (FOR ICD-9 250.00, 250.01). 1 each 0   Coenzyme Q10 (COQ-10 PO) Take by mouth daily.     COVID-19 mRNA vaccine, Moderna, 100 MCG/0.5ML injection Inject into the muscle. 0.25 mL 0   Glucosamine 500 MG CAPS 1,000 mg.     glucose blood (CONTOUR NEXT TEST) test strip Use as instructed- check glucose up to once a day as needed 100 strip 2    Multiple Vitamin (MULTIVITAMIN) tablet Take 1 tablet by mouth daily.     Turmeric 400 MG CAPS Take 1 capsule by mouth daily.     No current facility-administered medications on file prior to visit.    Review of Systems:  As per HPI- otherwise negative.   Physical Examination: Vitals:   04/03/21 0828  BP: 110/60  Pulse: 60  Resp: 18  Temp: 98 F (36.7 C)  SpO2: 100%   Vitals:   04/03/21 0828  Weight: 221 lb 6.4 oz (100.4 kg)  Height: 5' 8" (1.727 m)   Body mass index is 33.66 kg/m. Ideal Body Weight: Weight in (lb) to have BMI = 25: 164.1  GEN: no acute distress.  Looks well, mild obesity HEENT: Atraumatic, Normocephalic.  Bilateral TM wnl, oropharynx normal.  PEERL,EOMI.   Ears and Nose: No external deformity. CV: RRR, No M/G/R. No JVD. No thrill. No extra heart sounds. PULM: CTA B, no wheezes, crackles, rhonchi. No retractions. No resp. distress. No accessory muscle use. ABD: S, NT, ND, +BS. No rebound. No HSM. EXTR: No c/c/e PSYCH: Normally interactive. Conversant.  He also notes a sore spot on his left knee.  He notes he injured the area slightly while opening a pair of scissors recently, has noted it seems red and a bit warm.  His knee has a chronic bump due to old gout His tetanus is up-to-date     Assessment and Plan: H/O gastric bypass  Type 2 diabetes mellitus with hyperglycemia, without long-term current use of insulin (HCC) - Plan: Hemoglobin A1c  Other hyperlipidemia - Plan: Lipid panel  Essential hypertension - Plan: Basic metabolic panel  Screening for prostate cancer - Plan: PSA  Immunization due - Plan: Varicella-zoster vaccine IM (Shingrix)  Cellulitis of left knee - Plan: cephALEXin (KEFLEX) 500 MG capsule  Need for influenza vaccination - Plan: Flu Vaccine QUAD 6+ mos PF IM (Fluarix Quad PF)  Patient seen today for follow-up.  He is status post gastric bypass and doing very well Labs are pending as above Flu shot and shingles vaccine  given Concern for mild cellulitis on left knee, will treat with Keflex for 5 days-he will let me know if not improved  Signed Lamar Blinks, MD  Received his labs as below, message to patient  Results for orders placed or performed in visit on 11/65/79  Basic metabolic panel  Result Value Ref Range   Sodium 141 135 - 145 mEq/L   Potassium 4.8 3.5 - 5.1 mEq/L  Chloride 103 96 - 112 mEq/L   CO2 31 19 - 32 mEq/L   Glucose, Bld 113 (H) 70 - 99 mg/dL   BUN 16 6 - 23 mg/dL   Creatinine, Ser 0.81 0.40 - 1.50 mg/dL   GFR 97.51 >60.00 mL/min   Calcium 9.3 8.4 - 10.5 mg/dL  PSA  Result Value Ref Range   PSA 0.42 0.10 - 4.00 ng/mL  Lipid panel  Result Value Ref Range   Cholesterol 189 0 - 200 mg/dL   Triglycerides 110.0 0.0 - 149.0 mg/dL   HDL 51.10 >39.00 mg/dL   VLDL 22.0 0.0 - 40.0 mg/dL   LDL Cholesterol 116 (H) 0 - 99 mg/dL   Total CHOL/HDL Ratio 4    NonHDL 137.91   Hemoglobin A1c  Result Value Ref Range   Hgb A1c MFr Bld 6.0 4.6 - 6.5 %

## 2021-04-01 NOTE — Patient Instructions (Addendum)
Great to see you again today, I will be in touch your labs are soon as possible  I would recommend the newer COVID booster in the next couple of weeks Flu and shingirx 2nd dose today Use the keflex for 5 days for your knee- let me know if not getting better- Sooner if worse.    Assuming all is well, please see me in about 6 months

## 2021-04-03 ENCOUNTER — Encounter: Payer: Self-pay | Admitting: Family Medicine

## 2021-04-03 ENCOUNTER — Ambulatory Visit (INDEPENDENT_AMBULATORY_CARE_PROVIDER_SITE_OTHER): Payer: BC Managed Care – PPO | Admitting: Family Medicine

## 2021-04-03 ENCOUNTER — Other Ambulatory Visit (HOSPITAL_COMMUNITY): Payer: Self-pay

## 2021-04-03 ENCOUNTER — Other Ambulatory Visit: Payer: Self-pay

## 2021-04-03 VITALS — BP 110/60 | HR 60 | Temp 98.0°F | Resp 18 | Ht 68.0 in | Wt 221.4 lb

## 2021-04-03 DIAGNOSIS — Z9884 Bariatric surgery status: Secondary | ICD-10-CM | POA: Diagnosis not present

## 2021-04-03 DIAGNOSIS — Z125 Encounter for screening for malignant neoplasm of prostate: Secondary | ICD-10-CM

## 2021-04-03 DIAGNOSIS — E1165 Type 2 diabetes mellitus with hyperglycemia: Secondary | ICD-10-CM

## 2021-04-03 DIAGNOSIS — L03116 Cellulitis of left lower limb: Secondary | ICD-10-CM

## 2021-04-03 DIAGNOSIS — Z23 Encounter for immunization: Secondary | ICD-10-CM | POA: Diagnosis not present

## 2021-04-03 DIAGNOSIS — I10 Essential (primary) hypertension: Secondary | ICD-10-CM | POA: Diagnosis not present

## 2021-04-03 DIAGNOSIS — E7849 Other hyperlipidemia: Secondary | ICD-10-CM

## 2021-04-03 LAB — LIPID PANEL
Cholesterol: 189 mg/dL (ref 0–200)
HDL: 51.1 mg/dL (ref >39.00)
LDL Cholesterol: 116 mg/dL — ABNORMAL HIGH (ref 0–99)
NonHDL: 137.91
Total CHOL/HDL Ratio: 4
Triglycerides: 110 mg/dL (ref 0.0–149.0)
VLDL: 22 mg/dL (ref 0.0–40.0)

## 2021-04-03 LAB — HEMOGLOBIN A1C: Hgb A1c MFr Bld: 6 % (ref 4.6–6.5)

## 2021-04-03 LAB — BASIC METABOLIC PANEL
BUN: 16 mg/dL (ref 6–23)
CO2: 31 mEq/L (ref 19–32)
Calcium: 9.3 mg/dL (ref 8.4–10.5)
Chloride: 103 mEq/L (ref 96–112)
Creatinine, Ser: 0.81 mg/dL (ref 0.40–1.50)
GFR: 97.51 mL/min (ref >60.00)
Glucose, Bld: 113 mg/dL — ABNORMAL HIGH (ref 70–99)
Potassium: 4.8 mEq/L (ref 3.5–5.1)
Sodium: 141 mEq/L (ref 135–145)

## 2021-04-03 LAB — PSA: PSA: 0.42 ng/mL (ref 0.10–4.00)

## 2021-04-03 MED ORDER — CEPHALEXIN 500 MG PO CAPS
500.0000 mg | ORAL_CAPSULE | Freq: Three times a day (TID) | ORAL | 0 refills | Status: DC
Start: 2021-04-03 — End: 2021-09-25
  Filled 2021-04-03: qty 15, 5d supply, fill #0

## 2021-04-10 DIAGNOSIS — E119 Type 2 diabetes mellitus without complications: Secondary | ICD-10-CM | POA: Diagnosis not present

## 2021-04-10 DIAGNOSIS — E782 Mixed hyperlipidemia: Secondary | ICD-10-CM | POA: Diagnosis not present

## 2021-04-10 DIAGNOSIS — E6609 Other obesity due to excess calories: Secondary | ICD-10-CM | POA: Diagnosis not present

## 2021-04-10 DIAGNOSIS — Z9884 Bariatric surgery status: Secondary | ICD-10-CM | POA: Diagnosis not present

## 2021-05-12 ENCOUNTER — Encounter: Payer: Self-pay | Admitting: Family Medicine

## 2021-05-13 DIAGNOSIS — H109 Unspecified conjunctivitis: Secondary | ICD-10-CM | POA: Diagnosis not present

## 2021-06-06 DIAGNOSIS — H5213 Myopia, bilateral: Secondary | ICD-10-CM | POA: Diagnosis not present

## 2021-06-20 DIAGNOSIS — H5210 Myopia, unspecified eye: Secondary | ICD-10-CM | POA: Diagnosis not present

## 2021-09-20 NOTE — Patient Instructions (Addendum)
Good to see you again today- please see me in about 6 months assuming all is ok  ?I will be in touch with your labs ?Try the robaxin muscle relaxer as needed - remember this can make you drowsy!   ?

## 2021-09-20 NOTE — Progress Notes (Addendum)
[facility] ?[address], [address] ?[place], [place] [address] ?336 [phone] ?Fax [phone] ? ?Date:  09/25/2021  ? ?Name:  Steven Hodge   DOB:  [birth year]   MRN:  [id number] ? ?PCP:  Arnie Maiolo, Gay Filler, MD  ? ? ?Chief Complaint: Follow-up (Concerns/ questions: none/Eye Exam: [facility] Dr Dian Situ- UTD per pt) ? ? ?History of Present Illness: ? ?Steven Hodge is a 61 y.o. very pleasant male patient who presents with the following: ? ?Pt seen today for periodic follow-up ?Last seen by myself about 6 months ago ?History of hyperlipidemia, diabetes, hypertension, gout ?He underwent gastric bypass surgery in April 2021 ?He was seen by his gastric bypass surgeon, Dr Redmond Pulling back in November: ?Steven Hodge is a 61 y.o. male who is seen today for long-term follow-up after undergoing laparoscopic Roux-en-Y gastric bypass with hiatal hernia repair along with laparoscopic primary repair of umbilical hernia on October 03, 2019. Initial visit weight was 265 pounds. His preoperative weight was 264 pounds. His last visit was Oct 10, 2020 and his weight was 221 pounds.. States his original starting weight was 330 pounds. He denies any medical changes since I last saw him. He had his annual physical with his PCP last Wednesday and had blood work. Which I reviewed. Total cholesterol has gone from 207 down to 189. LDL has gone from 142 down to 116. A1c is stable at 6. He states that he is getting good sleep. 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 Outside of that he does not really get any dedicated physical activity other than going for walks with his wife. He takes a multivitamin calcium. He denies any trips to the emergency room or hospital. He has had 1 or 2 episodes of dumping. He experimented with eating some sugar containing foods and had sweats, palpitations and vomiting and diarrhea within 30 minutes on 2 or 3 occasions. He is having a daily bowel movement. ? ?Most recent labs looked good, a1c now in  prediabetes range ?He was not interested in cholesterol medication at that time  ?He has noted more muscle stiffness and pain in his muscles recently- seems to be mostly upper body, bilaterally  ?He is able to do his activities but he is really stiff after he rides in a car, for example  ?Mostly his shoulders and neck will bother him - he does not feel like it is joint pain however ? ?He is not taking a statin right now- he did in the past and this caused some muscle pains  ? ?Lab Results  ?Component Value Date  ? HGBA1C 6.0 04/03/2021  ? ? ?Eye exam- he does annual exams ?Covid booster- done in June  ?Urine micro due - can do today  ? 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 ? ?Patient Active Problem List  ? Diagnosis Date Noted  ? H/O gastric bypass 10/16/2019  ? Severe obesity ([place]) 10/03/2019  ? Fatty liver 07/21/2019  ? Other hyperlipidemia 05/07/2019  ? Type 2 diabetes mellitus with hyperglycemia, without long-term current use of insulin ([place]) 07/06/2017  ? 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  ? Vitamin D deficiency 11/18/2016  ? Essential hypertension 09/18/2016  ?  History of colonic polyps 08/20/2016  ? ? ?Past Medical History:  ?Diagnosis Date  ? Arthritis   ? knees shoulders hands  ? Back pain   ? Cancer ([place]) 06/09/2006  ? Basal cell carcinoma scalp;   ? Diabetes mellitus without complication ([place])   ? Gout   ? Hx of blood clots   ? Hypertension   ? ? ?Past Surgical History:  ?Procedure Laterality Date  ? basel cell cancer removed  2009  ? GASTRIC ROUX-EN-Y N/A 10/03/2019  ? Procedure: LAPAROSCOPIC ROUX-EN-Y GASTRIC BYPASS, HIATAL HERNIA REPARI, WITH UPPER ENDOSCOPY, ERAS Pathway;  Surgeon: Greer Pickerel, MD;  Location: [facility];  Service: General;  Laterality: N/A;  ? KNEE SURGERY  2005  ? ACL repair  R  ? UMBILICAL HERNIA REPAIR  10/03/2019  ? Procedure: HERNIA REPAIR UMBILICAL ADULT;  Surgeon: Greer Pickerel, MD;  Location: [facility];  Service: General;;  ? ? ?Social History  ? ?Tobacco Use  ? Smoking status: Former  ? Smokeless tobacco: Never  ?Vaping Use  ? Vaping Use: Never used  ?Substance Use Topics  ? Alcohol use: No  ? Drug use: No  ? ? ?Family History  ?Problem Relation Age of Onset  ? Diabetes Mother   ? Liver disease Mother   ? Obesity Mother   ? Cancer Father 47  ?     lung cancer  ? Diabetes Father   ? Cancer Sister 40  ?     lung cancer  ? Heart disease Brother 46  ?     cardiac stenting/CAD  ? Cancer Brother   ?     skin cancer  ? Arthritis Brother   ? ? ?No Known Allergies ? ?Medication list has been reviewed and updated. ? ?Current Outpatient Medications on File Prior to Visit  ?Medication Sig Dispense Refill  ? blood glucose meter kit and supplies Dispense based on patient and insurance preference. Use up to four times daily as directed. (FOR ICD-9 250.00, 250.01). 1 each 0  ? Coenzyme Q10 (COQ-10 PO) Take by mouth daily.    ? Glucosamine 500 MG CAPS 1,000 mg.    ? glucose blood (CONTOUR NEXT TEST) test strip Use as instructed- check glucose up to once a day as needed 100 strip 2  ? Multiple Vitamin (MULTIVITAMIN) tablet Take 1 tablet by mouth daily.    ? Turmeric 400 MG CAPS Take 1 capsule by mouth daily.    ? ?No current facility-administered medications on file prior to visit.  ? ? ?Review of Systems: ? ?As per HPI- otherwise negative. ? ? ?Physical Examination: ?Vitals:  ? 09/25/21 0813  ?BP: 122/80  ?Pulse: 66  ?Resp: 18  ?Temp: 97.9 ?F (36.6 ?C)  ?SpO2: 99%  ? ?Vitals:  ? 09/25/21 0813  ?Weight: 216 lb 6.4 oz (98.2 kg)  ?Height: _0  (1.727 m)  ? ?Body mass index is 32.9 kg/m?. ?Ideal Body Weight: Weight in (lb) to have BMI = 25: 164.1 ? ?GEN: no acute distress.  Mild obesity, looks well  ?HEENT: Atraumatic, Normocephalic.  Bilateral TM wnl, oropharynx normal.  PEERL,EOMI.   ?Ears and Nose: No  external deformity. ?CV: RRR, No M/G/R. No JVD. No thrill. No extra heart sounds. ?PULM: CTA B, no wheezes, crackles, rhonchi. No retractions. No resp. distress. No accessory muscle use. ?ABD: S, NT, ND, +BS. No rebound. No HSM. ?EXTR: No c/c/e ?PSYCH: Normally interactive. Conversant.  ?Cannot reproduce muscle soreness in neck or shoulders, normal cervical and shoulder ROM at  this time  ? ?Assessment and Plan: ?Essential hypertension - Plan: Comprehensive metabolic panel ? ?Type 2 diabetes mellitus with hyperglycemia, without long-term current use of insulin (HCC) - Plan: Hemoglobin A1c, Microalbumin / creatinine urine ratio ? ?H/O gastric bypass - Plan: CBC, Ferritin, B12, VITAMIN D 25 Hydroxy (Vit-D Deficiency, Fractures) ? ?Muscle pain - Plan: CK (Creatine Kinase), methocarbamol (ROBAXIN) 500 MG tablet ? ?Following up today ?Will plan further follow- up pending labs. ?CPE scheduled for fall ?Try robaxin as needed for muscle pain- cautioned regarding sedation ?DM has been under good control since weight loss surgery  ? ? ?Signed ?Lamar Blinks, MD ? ?Received patient labs as below, sent message ? ?Results for orders placed or performed in visit on 09/25/21  ?Comprehensive metabolic panel  ?Result Value Ref Range  ? Sodium 133 (L) 135 - 145 mEq/L  ? Potassium 4.2 3.5 - 5.1 mEq/L  ? Chloride 99 96 - 112 mEq/L  ? CO2 30 19 - 32 mEq/L  ? Glucose, Bld 133 (H) 70 - 99 mg/dL  ? BUN 21 6 - 23 mg/dL  ? Creatinine, Ser 0.79 0.40 - 1.50 mg/dL  ? Total Bilirubin 0.7 0.2 - 1.2 mg/dL  ? Alkaline Phosphatase 79 39 - 117 U/L  ? AST 19 0 - 37 U/L  ? ALT 30 0 - 53 U/L  ? Total Protein 6.8 6.0 - 8.3 g/dL  ? Albumin 4.2 3.5 - 5.2 g/dL  ? GFR 97.92 >60.00 mL/min  ? Calcium 9.2 8.4 - 10.5 mg/dL  ?Hemoglobin A1c  ?Result Value Ref Range  ? Hgb A1c MFr Bld 6.4 4.6 - 6.5 %  ?CBC  ?Result Value Ref Range  ? WBC 6.4 4.0 - 10.5 K/uL  ? RBC 4.93 4.22 - 5.81 Mil/uL  ? Platelets 155.0 150.0 - 400.0 K/uL  ? Hemoglobin 15.1 13.0 - 17.0 g/dL  ?  HCT 44.2 39.0 - 52.0 %  ? MCV 89.7 78.0 - 100.0 fl  ? MCHC 34.1 30.0 - 36.0 g/dL  ? RDW 12.9 11.5 - 15.5 %  ?Ferritin  ?Result Value Ref Range  ? Ferritin 126.4 22.0 - 322.0 ng/mL  ?B12  ?Result Va

## 2021-09-25 ENCOUNTER — Ambulatory Visit (INDEPENDENT_AMBULATORY_CARE_PROVIDER_SITE_OTHER): Payer: BC Managed Care – PPO | Admitting: Family Medicine

## 2021-09-25 ENCOUNTER — Encounter: Payer: Self-pay | Admitting: Family Medicine

## 2021-09-25 ENCOUNTER — Other Ambulatory Visit (HOSPITAL_COMMUNITY): Payer: Self-pay

## 2021-09-25 VITALS — BP 122/80 | HR 66 | Temp 97.9°F | Resp 18 | Ht 68.0 in | Wt 216.4 lb

## 2021-09-25 DIAGNOSIS — Z9884 Bariatric surgery status: Secondary | ICD-10-CM

## 2021-09-25 DIAGNOSIS — M791 Myalgia, unspecified site: Secondary | ICD-10-CM | POA: Diagnosis not present

## 2021-09-25 DIAGNOSIS — E1165 Type 2 diabetes mellitus with hyperglycemia: Secondary | ICD-10-CM

## 2021-09-25 DIAGNOSIS — I10 Essential (primary) hypertension: Secondary | ICD-10-CM

## 2021-09-25 LAB — CBC
HCT: 44.2 % (ref 39.0–52.0)
Hemoglobin: 15.1 g/dL (ref 13.0–17.0)
MCHC: 34.1 g/dL (ref 30.0–36.0)
MCV: 89.7 fl (ref 78.0–100.0)
Platelets: 155 10*3/uL (ref 150.0–400.0)
RBC: 4.93 Mil/uL (ref 4.22–5.81)
RDW: 12.9 % (ref 11.5–15.5)
WBC: 6.4 10*3/uL (ref 4.0–10.5)

## 2021-09-25 LAB — COMPREHENSIVE METABOLIC PANEL
ALT: 30 U/L (ref 0–53)
AST: 19 U/L (ref 0–37)
Albumin: 4.2 g/dL (ref 3.5–5.2)
Alkaline Phosphatase: 79 U/L (ref 39–117)
BUN: 21 mg/dL (ref 6–23)
CO2: 30 mEq/L (ref 19–32)
Calcium: 9.2 mg/dL (ref 8.4–10.5)
Chloride: 99 mEq/L (ref 96–112)
Creatinine, Ser: 0.79 mg/dL (ref 0.40–1.50)
GFR: 97.92 mL/min (ref >60.00)
Glucose, Bld: 133 mg/dL — ABNORMAL HIGH (ref 70–99)
Potassium: 4.2 mEq/L (ref 3.5–5.1)
Sodium: 133 mEq/L — ABNORMAL LOW (ref 135–145)
Total Bilirubin: 0.7 mg/dL (ref 0.2–1.2)
Total Protein: 6.8 g/dL (ref 6.0–8.3)

## 2021-09-25 LAB — VITAMIN B12: Vitamin B-12: 858 pg/mL (ref 211–911)

## 2021-09-25 LAB — MICROALBUMIN / CREATININE URINE RATIO
Creatinine,U: 111 mg/dL
Microalb Creat Ratio: 0.6 mg/g (ref 0.0–30.0)
Microalb, Ur: <0.7 mg/dL (ref 0.0–1.9)

## 2021-09-25 LAB — VITAMIN D 25 HYDROXY (VIT D DEFICIENCY, FRACTURES): VITD: 35.06 ng/mL (ref 30.00–100.00)

## 2021-09-25 LAB — HEMOGLOBIN A1C: Hgb A1c MFr Bld: 6.4 % (ref 4.6–6.5)

## 2021-09-25 LAB — CK: Total CK: 104 U/L (ref 7–232)

## 2021-09-25 LAB — FERRITIN: Ferritin: 126.4 ng/mL (ref 22.0–322.0)

## 2021-09-25 MED ORDER — METHOCARBAMOL 500 MG PO TABS
500.0000 mg | ORAL_TABLET | Freq: Three times a day (TID) | ORAL | 1 refills | Status: DC | PRN
  Filled 2021-09-25: qty 30, 10d supply, fill #0

## 2021-10-03 DIAGNOSIS — Z9884 Bariatric surgery status: Secondary | ICD-10-CM | POA: Diagnosis not present

## 2021-10-03 DIAGNOSIS — E6609 Other obesity due to excess calories: Secondary | ICD-10-CM | POA: Diagnosis not present

## 2021-10-03 DIAGNOSIS — E119 Type 2 diabetes mellitus without complications: Secondary | ICD-10-CM | POA: Diagnosis not present

## 2022-01-15 ENCOUNTER — Encounter (INDEPENDENT_AMBULATORY_CARE_PROVIDER_SITE_OTHER): Payer: Self-pay

## 2022-01-22 DIAGNOSIS — L03115 Cellulitis of right lower limb: Secondary | ICD-10-CM | POA: Diagnosis not present

## 2022-03-29 NOTE — Patient Instructions (Incomplete)
Good to see you today- I will be in touch with your labs asap Recheck in 6 months assuming all is well Recommend a covid booster this fall  Flu shot today Please give Dr Collene Mares a call and make sure you are UTD on your colonoscopy

## 2022-03-29 NOTE — Progress Notes (Unsigned)
[facility] [place] [address], [address] [place], [place] [address] [phone] Fax [phone]  Date:  04/02/2022   Name:  Steven Hodge   DOB:  04/15/1961   MRN:  [id number]  PCP:  Copland, Jessica C, MD    Chief Complaint: No chief complaint on file.   History of Present Illness:  Steven Hodge is a 61 y.o. very pleasant male patient who presents with the following:  Pt seen today for a CPE Last seen by myself in April  History of hyperlipidemia, diet controlled diabetes, hypertension, gout He underwent gastric bypass surgery in April 2021  History of statin intolerance Recommend covid booster Flu shot Eye exam Foot exam Colon cancer screening  Lab Results  Component Value Date   HGBA1C 6.4 09/25/2021   Labs done in April- CMP, vit D, B12, CBC, A1c  Lab Results  Component Value Date   PSA1 0.6 06/03/2017   PSA 0.42 04/03/2021   PSA 0.41 12/29/2018   PSA 0.4 04/22/2016   Seen by his gastric bypass surgeon in April - [facility] Overall doing quite well. Since his preoperative visit he is down 48 pounds reflecting a total weight change of 18.02%. His starting weight with obesity medicine was 330 pounds.  No signs of complications. Labs are okay. Follow-up in 1 year for next annual appointment. He is very compliant with taking his multivitamin and calcium. 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 I provided him with a full list of labs that we typically order on a yearly basis so Dr. Copeland can order them at his next yearly physical. I recommended that he continue to monitor his blood pressure at home to make sure it is not increasing too much with salt intake.      Patient Active Problem List   Diagnosis Date Noted   H/O gastric bypass 10/16/2019   Severe obesity (HCC) 10/03/2019   Fatty liver 07/21/2019   Other hyperlipidemia 05/07/2019   Type 2  diabetes mellitus with hyperglycemia, without long-term current use of insulin (HCC) 07/06/2017   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   Vitamin D deficiency 11/18/2016   Essential hypertension 09/18/2016   History of colonic polyps 08/20/2016    Past Medical History:  Diagnosis Date   Arthritis    knees shoulders hands   Back pain    Cancer (HCC) 06/09/2006   Basal cell carcinoma scalp;    Diabetes mellitus without complication (HCC)    Gout    Hx of blood clots    Hypertension     Past Surgical History:  Procedure Laterality Date   basel cell cancer removed  2009   GASTRIC ROUX-EN-Y N/A 10/03/2019   Procedure: LAPAROSCOPIC ROUX-EN-Y GASTRIC BYPASS, HIATAL HERNIA REPARI, WITH UPPER ENDOSCOPY, ERAS Pathway;  Surgeon: Wilson, Eric, MD;  Location: [facility];  Service: General;  Laterality: N/A;   KNEE SURGERY  2005   ACL repair R   UMBILICAL HERNIA REPAIR  10/03/2019   Procedure: HERNIA REPAIR UMBILICAL ADULT;  Surgeon: Wilson, Eric, MD;  Location: [facility];  Service: General;;    Social History   Tobacco Use   Smoking status: Former   Smokeless tobacco: Never  Vaping Use   Vaping Use: Never used  Substance Use Topics   Alcohol use: No   Drug use: No      Family History  Problem Relation Age of Onset   Diabetes Mother    Liver disease Mother    Obesity Mother    Cancer Father 70       lung cancer   Diabetes Father    Cancer Sister 65       lung cancer   Heart disease Brother 66       cardiac stenting/CAD   Cancer Brother        skin cancer   Arthritis Brother     No Known Allergies  Medication list has been reviewed and updated.  Current Outpatient Medications on File Prior to Visit  Medication Sig Dispense Refill   blood glucose meter kit and supplies Dispense based on patient and insurance preference. Use up to four times daily as directed. (FOR ICD-9 250.00, 250.01). 1 each 0   Coenzyme Q10 (COQ-10 PO) Take by mouth daily.      Glucosamine 500 MG CAPS 1,000 mg.     glucose blood (CONTOUR NEXT TEST) test strip Use as instructed- check glucose up to once a day as needed 100 strip 2   methocarbamol (ROBAXIN) 500 MG tablet Take 1 tablet (500 mg total) by mouth every 8 (eight) hours as needed for muscle spasms. 30 tablet 1   Multiple Vitamin (MULTIVITAMIN) tablet Take 1 tablet by mouth daily.     Turmeric 400 MG CAPS Take 1 capsule by mouth daily.     No current facility-administered medications on file prior to visit.    Review of Systems:  As per HPI- otherwise negative.   Physical Examination: There were no vitals filed for this visit. There were no vitals filed for this visit. There is no height or weight on file to calculate BMI. Ideal Body Weight:    GEN: no acute distress. HEENT: Atraumatic, Normocephalic.  Ears and Nose: No external deformity. CV: RRR, No M/G/R. No JVD. No thrill. No extra heart sounds. PULM: CTA B, no wheezes, crackles, rhonchi. No retractions. No resp. distress. No accessory muscle use. ABD: S, NT, ND, +BS. No rebound. No HSM. EXTR: No c/c/e PSYCH: Normally interactive. Conversant.    Assessment and Plan: *** Physical exam today Encouraged healthy diet and exercise routine Will plan further follow- up pending labs. Recheck in 6 months assuming all is well  Signed Jessica Copland, MD  

## 2022-04-02 ENCOUNTER — Ambulatory Visit (INDEPENDENT_AMBULATORY_CARE_PROVIDER_SITE_OTHER): Payer: BC Managed Care – PPO | Admitting: Family Medicine

## 2022-04-02 ENCOUNTER — Encounter: Payer: Self-pay | Admitting: Family Medicine

## 2022-04-02 VITALS — BP 118/68 | HR 60 | Temp 97.8°F | Resp 18 | Ht 68.0 in | Wt 218.8 lb

## 2022-04-02 DIAGNOSIS — E7849 Other hyperlipidemia: Secondary | ICD-10-CM | POA: Diagnosis not present

## 2022-04-02 DIAGNOSIS — I10 Essential (primary) hypertension: Secondary | ICD-10-CM

## 2022-04-02 DIAGNOSIS — Z125 Encounter for screening for malignant neoplasm of prostate: Secondary | ICD-10-CM | POA: Diagnosis not present

## 2022-04-02 DIAGNOSIS — Z9884 Bariatric surgery status: Secondary | ICD-10-CM

## 2022-04-02 DIAGNOSIS — E1165 Type 2 diabetes mellitus with hyperglycemia: Secondary | ICD-10-CM

## 2022-04-02 DIAGNOSIS — Z0000 Encounter for general adult medical examination without abnormal findings: Secondary | ICD-10-CM

## 2022-04-02 DIAGNOSIS — Z23 Encounter for immunization: Secondary | ICD-10-CM | POA: Diagnosis not present

## 2022-04-02 LAB — PSA: PSA: 0.46 ng/mL (ref 0.10–4.00)

## 2022-04-02 LAB — LIPID PANEL
Cholesterol: 181 mg/dL (ref 0–200)
HDL: 55.3 mg/dL (ref >39.00)
LDL Cholesterol: 110 mg/dL — ABNORMAL HIGH (ref 0–99)
NonHDL: 125.72
Total CHOL/HDL Ratio: 3
Triglycerides: 80 mg/dL (ref 0.0–149.0)
VLDL: 16 mg/dL (ref 0.0–40.0)

## 2022-04-02 LAB — BASIC METABOLIC PANEL
BUN: 17 mg/dL (ref 6–23)
CO2: 33 mEq/L — ABNORMAL HIGH (ref 19–32)
Calcium: 9.4 mg/dL (ref 8.4–10.5)
Chloride: 103 mEq/L (ref 96–112)
Creatinine, Ser: 0.8 mg/dL (ref 0.40–1.50)
GFR: 97.19 mL/min (ref >60.00)
Glucose, Bld: 125 mg/dL — ABNORMAL HIGH (ref 70–99)
Potassium: 4.7 mEq/L (ref 3.5–5.1)
Sodium: 142 mEq/L (ref 135–145)

## 2022-04-02 LAB — VITAMIN B12: Vitamin B-12: 827 pg/mL (ref 211–911)

## 2022-04-02 LAB — MICROALBUMIN / CREATININE URINE RATIO
Creatinine,U: 137.2 mg/dL
Microalb Creat Ratio: 0.7 mg/g (ref 0.0–30.0)
Microalb, Ur: 1 mg/dL (ref 0.0–1.9)

## 2022-04-02 LAB — FERRITIN: Ferritin: 84.4 ng/mL (ref 22.0–322.0)

## 2022-04-02 LAB — HEMOGLOBIN A1C: Hgb A1c MFr Bld: 6.2 % (ref 4.6–6.5)

## 2022-04-04 ENCOUNTER — Encounter: Payer: Self-pay | Admitting: *Deleted

## 2022-04-10 ENCOUNTER — Encounter: Payer: Self-pay | Admitting: Family Medicine

## 2022-04-23 DIAGNOSIS — R110 Nausea: Secondary | ICD-10-CM | POA: Diagnosis not present

## 2022-04-23 DIAGNOSIS — R509 Fever, unspecified: Secondary | ICD-10-CM | POA: Diagnosis not present

## 2022-04-23 DIAGNOSIS — R519 Headache, unspecified: Secondary | ICD-10-CM | POA: Diagnosis not present

## 2022-06-18 DIAGNOSIS — E119 Type 2 diabetes mellitus without complications: Secondary | ICD-10-CM | POA: Diagnosis not present

## 2022-06-18 DIAGNOSIS — H5210 Myopia, unspecified eye: Secondary | ICD-10-CM | POA: Diagnosis not present

## 2022-06-18 LAB — HM DIABETES EYE EXAM

## 2022-07-09 DIAGNOSIS — L820 Inflamed seborrheic keratosis: Secondary | ICD-10-CM | POA: Diagnosis not present

## 2022-07-09 DIAGNOSIS — L570 Actinic keratosis: Secondary | ICD-10-CM | POA: Diagnosis not present

## 2022-07-09 DIAGNOSIS — I8312 Varicose veins of left lower extremity with inflammation: Secondary | ICD-10-CM | POA: Diagnosis not present

## 2022-07-09 DIAGNOSIS — L918 Other hypertrophic disorders of the skin: Secondary | ICD-10-CM | POA: Diagnosis not present

## 2022-07-09 DIAGNOSIS — L218 Other seborrheic dermatitis: Secondary | ICD-10-CM | POA: Diagnosis not present

## 2022-07-09 DIAGNOSIS — Z85828 Personal history of other malignant neoplasm of skin: Secondary | ICD-10-CM | POA: Diagnosis not present

## 2022-10-07 DIAGNOSIS — Z9884 Bariatric surgery status: Secondary | ICD-10-CM | POA: Diagnosis not present

## 2022-10-07 DIAGNOSIS — L570 Actinic keratosis: Secondary | ICD-10-CM | POA: Diagnosis not present

## 2022-10-07 DIAGNOSIS — Z85828 Personal history of other malignant neoplasm of skin: Secondary | ICD-10-CM | POA: Diagnosis not present

## 2022-10-07 DIAGNOSIS — L858 Other specified epidermal thickening: Secondary | ICD-10-CM | POA: Diagnosis not present

## 2022-10-07 DIAGNOSIS — L84 Corns and callosities: Secondary | ICD-10-CM | POA: Diagnosis not present

## 2022-10-08 NOTE — Patient Instructions (Addendum)
It was great to see you again today Recommend COVID booster if not done in the last 9 months or so It looks like your colonoscopy may be due, please touch base with your gastroenterologist  Assuming all is well, lets check back in about 6 months Gabapentin at bedtime as needed

## 2022-10-08 NOTE — Progress Notes (Signed)
[facility] [address], [address] [place], [place] [address] [phone]  Date:  10/13/2022   Name:  Steven Hodge   DOB:  Aug 09, 1960   MRN:  [id number]  PCP:  Pearline Cables, MD    Chief Complaint: 6 month follow up (Concerns/ questions: Erroll Luna due/(Rec req sent for colon))   History of Present Illness:  Steven Hodge is a 62 y.o. very pleasant male patient who presents with the following:  Patient seen today for periodic follow-up Most recent visit with myself was in October for physical exam History of hyperlipidemia, diet controlled diabetes, hypertension, gout He underwent gastric bypass surgery in April 2021- has done great since    History of statin intolerance He was seen by general surgery last week for follow-up. They plan for annual follow-up No recent gout attack  No medications currently Some labs done in October-BMP, lipid, iron/ferritin, B12, A1c, PSA Can offer coronary calcium- he is not interested right now  Lab Results  Component Value Date   HGBA1C 6.2 04/02/2022   He may have some sx of low glucose- his glucose may be in the 60s when this happens.  He notes this tends to occur if he eats sweets, he made an experience "dumping syndrome".  As long as he avoids sweets this does not happen  He does see derm annually for skin check- history of BCC  He does use gabapentin at times at bedtime if he is really stiff - he has gotten this from Dr Leretha Pol in the past for sciatica and wonders if I can refill for him Using 300 mg at bedtime as needed   Patient Active Problem List   Diagnosis Date Noted   H/O gastric bypass 10/16/2019   Severe obesity (HCC) 10/03/2019   Fatty liver 07/21/2019   Other hyperlipidemia 05/07/2019   Type 2 diabetes mellitus with hyperglycemia, without long-term current use of insulin (HCC) 07/06/2017   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   Vitamin D  deficiency 11/18/2016   Essential hypertension 09/18/2016   History of colonic polyps 08/20/2016    Past Medical History:  Diagnosis Date   Arthritis    knees shoulders hands   Back pain    Cancer (HCC) 06/09/2006   Basal cell carcinoma scalp;    Diabetes mellitus without complication (HCC)    Gout    Hx of blood clots    Hypertension     Past Surgical History:  Procedure Laterality Date   basel cell cancer removed  2009   GASTRIC ROUX-EN-Y N/A 10/03/2019   Procedure: LAPAROSCOPIC ROUX-EN-Y GASTRIC BYPASS, HIATAL HERNIA REPARI, WITH UPPER ENDOSCOPY, ERAS Pathway;  Surgeon: Gaynelle Adu, MD;  Location: [facility];  Service: General;  Laterality: N/A;   KNEE SURGERY  2005   ACL repair R   UMBILICAL HERNIA REPAIR  10/03/2019   Procedure: HERNIA REPAIR UMBILICAL ADULT;  Surgeon: Gaynelle Adu, MD;  Location: [facility];  Service: General;;    Social History   Tobacco Use   Smoking status: Former   Smokeless tobacco: Never  Building services engineer Use: Never used  Substance Use Topics   Alcohol use: No   Drug use: No    Family History  Problem Relation Age of Onset   Diabetes Mother    Liver disease Mother    Obesity Mother    Cancer Father 73  lung cancer   Diabetes Father    Cancer Sister 73       lung cancer   Heart disease Brother 85       cardiac stenting/CAD   Cancer Brother        skin cancer   Arthritis Brother     No Known Allergies  Medication list has been reviewed and updated.  Current Outpatient Medications on File Prior to Visit  Medication Sig Dispense Refill   blood glucose meter kit and supplies Dispense based on patient and insurance preference. Use up to four times daily as directed. (FOR ICD-9 250.00, 250.01). 1 each 0   Glucosamine 500 MG CAPS 1,000 mg.     glucose blood (CONTOUR NEXT TEST) test strip Use as instructed- check glucose up to once a day as needed 100 strip 2   Multiple Vitamin (MULTIVITAMIN) tablet Take 1 tablet by mouth daily.      Turmeric 400 MG CAPS Take 1 capsule by mouth daily.     No current facility-administered medications on file prior to visit.    Review of Systems:  As per HPI- otherwise negative.   Physical Examination: Vitals:   10/13/22 0923  BP: 120/62  Pulse: 62  Resp: 18  Temp: 98.4 F (36.9 C)  SpO2: 98%   Vitals:   10/13/22 0923  Weight: 227 lb 6.4 oz (103.1 kg)  Height: 5\' 8"  (1.727 m)   Body mass index is 34.58 kg/m. Ideal Body Weight: Weight in (lb) to have BMI = 25: 164.1  GEN: no acute distress.  Mildly obese, looks well HEENT: Atraumatic, Normocephalic.  Net IO: No IO data has been entered for this period [10/13/22 0951] Ears and Nose: No external deformity. CV: RRR, No M/G/R. No JVD. No thrill. No extra heart sounds. PULM: CTA B, no wheezes, crackles, rhonchi. No retractions. No resp. distress. No accessory muscle use. ABD: S, NT, ND. No rebound. No HSM. EXTR: No c/c/e PSYCH: Normally interactive. Conversant.    Assessment and Plan: Essential hypertension - Plan: CBC, Comprehensive metabolic panel  Type 2 diabetes mellitus with hyperglycemia, without long-term current use of insulin (HCC) - Plan: Hemoglobin A1c  H/O gastric bypass  Chronic bilateral low back pain with sciatica, sciatica laterality unspecified - Plan: gabapentin (NEURONTIN) 300 MG capsule  Patient seen today for follow-up.  Blood pressures under good control, labs are pending as above Diabetes is currently diet controlled.  He does note occasional hypoglycemia, he is able to avoid this by not consuming sugars He is stable from a gastric bypass standpoint, lab work is up-to-date Refill gabapentin that he uses at bedtime as needed Patient notes he has been in touch with his gastroenterologist regarding his colonoscopy Will plan further follow- up pending labs. Recheck 6 months  Signed Abbe Amsterdam, MD  Received labs as below, message to patient  Results for orders placed or performed in  visit on 10/13/22  CBC  Result Value Ref Range   WBC 5.9 4.0 - 10.5 K/uL   RBC 5.06 4.22 - 5.81 Mil/uL   Platelets 158.0 150.0 - 400.0 K/uL   Hemoglobin 15.6 13.0 - 17.0 g/dL   HCT 16.1 09.6 - 04.5 %   MCV 89.9 78.0 - 100.0 fl   MCHC 34.2 30.0 - 36.0 g/dL   RDW 40.9 81.1 - 91.4 %  Comprehensive metabolic panel  Result Value Ref Range   Sodium 143 135 - 145 mEq/L   Potassium 4.4 3.5 - 5.1 mEq/L   Chloride  106 96 - 112 mEq/L   CO2 28 19 - 32 mEq/L   Glucose, Bld 131 (H) 70 - 99 mg/dL   BUN 19 6 - 23 mg/dL   Creatinine, Ser 0.98 0.40 - 1.50 mg/dL   Total Bilirubin 0.4 0.2 - 1.2 mg/dL   Alkaline Phosphatase 71 39 - 117 U/L   AST 19 0 - 37 U/L   ALT 27 0 - 53 U/L   Total Protein 6.5 6.0 - 8.3 g/dL   Albumin 4.0 3.5 - 5.2 g/dL   GFR 11.91 >47.82 mL/min   Calcium 9.2 8.4 - 10.5 mg/dL  Hemoglobin N5A  Result Value Ref Range   Hgb A1c MFr Bld 6.4 4.6 - 6.5 %

## 2022-10-13 ENCOUNTER — Encounter: Payer: Self-pay | Admitting: Pharmacist

## 2022-10-13 ENCOUNTER — Encounter: Payer: Self-pay | Admitting: Family Medicine

## 2022-10-13 ENCOUNTER — Ambulatory Visit (INDEPENDENT_AMBULATORY_CARE_PROVIDER_SITE_OTHER): Payer: BC Managed Care – PPO | Admitting: Family Medicine

## 2022-10-13 ENCOUNTER — Other Ambulatory Visit (HOSPITAL_COMMUNITY): Payer: Self-pay

## 2022-10-13 ENCOUNTER — Other Ambulatory Visit: Payer: Self-pay

## 2022-10-13 VITALS — BP 120/62 | HR 62 | Temp 98.4°F | Resp 18 | Ht 68.0 in | Wt 227.4 lb

## 2022-10-13 DIAGNOSIS — I10 Essential (primary) hypertension: Secondary | ICD-10-CM

## 2022-10-13 DIAGNOSIS — M5440 Lumbago with sciatica, unspecified side: Secondary | ICD-10-CM

## 2022-10-13 DIAGNOSIS — G8929 Other chronic pain: Secondary | ICD-10-CM

## 2022-10-13 DIAGNOSIS — E1165 Type 2 diabetes mellitus with hyperglycemia: Secondary | ICD-10-CM

## 2022-10-13 DIAGNOSIS — Z9884 Bariatric surgery status: Secondary | ICD-10-CM | POA: Diagnosis not present

## 2022-10-13 LAB — CBC
HCT: 45.5 % (ref 39.0–52.0)
Hemoglobin: 15.6 g/dL (ref 13.0–17.0)
MCHC: 34.2 g/dL (ref 30.0–36.0)
MCV: 89.9 fl (ref 78.0–100.0)
Platelets: 158 10*3/uL (ref 150.0–400.0)
RBC: 5.06 Mil/uL (ref 4.22–5.81)
RDW: 13.3 % (ref 11.5–15.5)
WBC: 5.9 10*3/uL (ref 4.0–10.5)

## 2022-10-13 LAB — COMPREHENSIVE METABOLIC PANEL
ALT: 27 U/L (ref 0–53)
AST: 19 U/L (ref 0–37)
Albumin: 4 g/dL (ref 3.5–5.2)
Alkaline Phosphatase: 71 U/L (ref 39–117)
BUN: 19 mg/dL (ref 6–23)
CO2: 28 mEq/L (ref 19–32)
Calcium: 9.2 mg/dL (ref 8.4–10.5)
Chloride: 106 mEq/L (ref 96–112)
Creatinine, Ser: 0.85 mg/dL (ref 0.40–1.50)
GFR: 95.07 mL/min (ref >60.00)
Glucose, Bld: 131 mg/dL — ABNORMAL HIGH (ref 70–99)
Potassium: 4.4 mEq/L (ref 3.5–5.1)
Sodium: 143 mEq/L (ref 135–145)
Total Bilirubin: 0.4 mg/dL (ref 0.2–1.2)
Total Protein: 6.5 g/dL (ref 6.0–8.3)

## 2022-10-13 LAB — HEMOGLOBIN A1C: Hgb A1c MFr Bld: 6.4 % (ref 4.6–6.5)

## 2022-10-13 MED ORDER — GABAPENTIN 300 MG PO CAPS
300.0000 mg | ORAL_CAPSULE | Freq: Every day | ORAL | 3 refills | Status: DC
  Filled 2022-10-13: qty 90, 90d supply, fill #0

## 2023-04-12 NOTE — Progress Notes (Unsigned)
[facility] [address], [address] [place], [place] [address] [phone]  Date:  04/15/2023   Name:  Steven Hodge   DOB:  03-02-1961   MRN:  [id number]  PCP:  Pearline Cables, MD    Chief Complaint: No chief complaint on file.   History of Present Illness:  Steven Hodge is a 62 y.o. very pleasant male patient who presents with the following:  Patient seen today for periodic follow-up Most recent visit with myself was in May of this year  History of hyperlipidemia, diet controlled diabetes, hypertension, gout He underwent gastric bypass surgery in April 2021- has done great since    History of statin intolerance Uses gabapentin at bedtime as needed for muscle stiffness and pain-currently this is this only medication  Colon cancer screening-it appears this is due, colonoscopy done in 2018 Flu shot COVID booster Urine micro is due Foot exam is due Lab work done in [employer], CBC, A1c  Lab Results  Component Value Date   HGBA1C 6.4 10/13/2022     Patient Active Problem List   Diagnosis Date Noted   H/O gastric bypass 10/16/2019   Severe obesity (HCC) 10/03/2019   Fatty liver 07/21/2019   Other hyperlipidemia 05/07/2019   Type 2 diabetes mellitus with hyperglycemia, without long-term current use of insulin (HCC) 07/06/2017   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   Vitamin D deficiency 11/18/2016   Essential hypertension 09/18/2016   History of colonic polyps 08/20/2016    Past Medical History:  Diagnosis Date   Arthritis    knees shoulders hands   Back pain    Cancer (HCC) 06/09/2006   Basal cell carcinoma scalp;    Diabetes mellitus without complication (HCC)    Gout    Hx of blood clots    Hypertension     Past Surgical History:  Procedure Laterality Date   basel cell cancer removed  2009   GASTRIC ROUX-EN-Y N/A 10/03/2019   Procedure: LAPAROSCOPIC ROUX-EN-Y GASTRIC  BYPASS, HIATAL HERNIA REPARI, WITH UPPER ENDOSCOPY, ERAS Pathway;  Surgeon: Gaynelle Adu, MD;  Location: [facility];  Service: General;  Laterality: N/A;   KNEE SURGERY  2005   ACL repair R   UMBILICAL HERNIA REPAIR  10/03/2019   Procedure: HERNIA REPAIR UMBILICAL ADULT;  Surgeon: Gaynelle Adu, MD;  Location: [facility];  Service: General;;    Social History   Tobacco Use   Smoking status: Former   Smokeless tobacco: Never  Advertising account planner   Vaping status: Never Used  Substance Use Topics   Alcohol use: No   Drug use: No    Family History  Problem Relation Age of Onset   Diabetes Mother    Liver disease Mother    Obesity Mother    Cancer Father 1       lung cancer   Diabetes Father    Cancer Sister 6       lung cancer   Heart disease Brother 34       cardiac stenting/CAD   Cancer Brother        skin cancer   Arthritis Brother     No Known Allergies  Medication list has been reviewed and updated.  Current Outpatient Medications on File Prior to Visit  Medication Sig Dispense Refill   blood glucose meter kit and supplies Dispense based on patient and insurance preference. Use up to four times daily as  directed. (FOR ICD-9 250.00, 250.01). 1 each 0   gabapentin (NEURONTIN) 300 MG capsule Take 1 capsule (300 mg total) by mouth at bedtime. Use as needed for back pain 90 capsule 3   Glucosamine 500 MG CAPS 1,000 mg.     glucose blood (CONTOUR NEXT TEST) test strip Use as instructed- check glucose up to once a day as needed 100 strip 2   Multiple Vitamin (MULTIVITAMIN) tablet Take 1 tablet by mouth daily.     Turmeric 400 MG CAPS Take 1 capsule by mouth daily.     No current facility-administered medications on file prior to visit.    Review of Systems:  As per HPI- otherwise negative.   Physical Examination: There were no vitals filed for this visit. There were no vitals filed for this visit. There is no height or weight on file to calculate BMI. Ideal Body Weight:     GEN: no acute distress. HEENT: Atraumatic, Normocephalic.  Ears and Nose: No external deformity. CV: RRR, No M/G/R. No JVD. No thrill. No extra heart sounds. PULM: CTA B, no wheezes, crackles, rhonchi. No retractions. No resp. distress. No accessory muscle use. ABD: S, NT, ND, +BS. No rebound. No HSM. EXTR: No c/c/e PSYCH: Normally interactive. Conversant.    Assessment and Plan: ***  Signed Abbe Amsterdam, MD

## 2023-04-12 NOTE — Patient Instructions (Incomplete)
It was great to see again today, I will be in touch with your labs  Assuming all is well please see me in about 6 months  Not done already recommend seasonal flu shot, COVID-19 booster

## 2023-04-15 ENCOUNTER — Ambulatory Visit (INDEPENDENT_AMBULATORY_CARE_PROVIDER_SITE_OTHER): Payer: BC Managed Care – PPO | Admitting: Family Medicine

## 2023-04-15 ENCOUNTER — Other Ambulatory Visit: Payer: Self-pay

## 2023-04-15 ENCOUNTER — Encounter: Payer: Self-pay | Admitting: Family Medicine

## 2023-04-15 VITALS — BP 132/80 | HR 61 | Temp 97.7°F | Resp 18 | Ht 68.0 in | Wt 224.6 lb

## 2023-04-15 DIAGNOSIS — Z9884 Bariatric surgery status: Secondary | ICD-10-CM | POA: Diagnosis not present

## 2023-04-15 DIAGNOSIS — E7849 Other hyperlipidemia: Secondary | ICD-10-CM

## 2023-04-15 DIAGNOSIS — Z125 Encounter for screening for malignant neoplasm of prostate: Secondary | ICD-10-CM

## 2023-04-15 DIAGNOSIS — G8929 Other chronic pain: Secondary | ICD-10-CM

## 2023-04-15 DIAGNOSIS — E1165 Type 2 diabetes mellitus with hyperglycemia: Secondary | ICD-10-CM | POA: Diagnosis not present

## 2023-04-15 DIAGNOSIS — I10 Essential (primary) hypertension: Secondary | ICD-10-CM | POA: Diagnosis not present

## 2023-04-15 DIAGNOSIS — Z23 Encounter for immunization: Secondary | ICD-10-CM | POA: Diagnosis not present

## 2023-04-15 DIAGNOSIS — M5440 Lumbago with sciatica, unspecified side: Secondary | ICD-10-CM

## 2023-04-15 LAB — LIPID PANEL
Cholesterol: 179 mg/dL (ref 0–200)
HDL: 52.2 mg/dL (ref >39.00)
LDL Cholesterol: 107 mg/dL — ABNORMAL HIGH (ref 0–99)
NonHDL: 126.83
Total CHOL/HDL Ratio: 3
Triglycerides: 100 mg/dL (ref 0.0–149.0)
VLDL: 20 mg/dL (ref 0.0–40.0)

## 2023-04-15 LAB — COMPREHENSIVE METABOLIC PANEL
ALT: 26 U/L (ref 0–53)
AST: 22 U/L (ref 0–37)
Albumin: 4.3 g/dL (ref 3.5–5.2)
Alkaline Phosphatase: 77 U/L (ref 39–117)
BUN: 15 mg/dL (ref 6–23)
CO2: 31 meq/L (ref 19–32)
Calcium: 9.2 mg/dL (ref 8.4–10.5)
Chloride: 103 meq/L (ref 96–112)
Creatinine, Ser: 0.86 mg/dL (ref 0.40–1.50)
GFR: 94.41 mL/min (ref >60.00)
Glucose, Bld: 128 mg/dL — ABNORMAL HIGH (ref 70–99)
Potassium: 4.5 meq/L (ref 3.5–5.1)
Sodium: 141 meq/L (ref 135–145)
Total Bilirubin: 0.6 mg/dL (ref 0.2–1.2)
Total Protein: 6.7 g/dL (ref 6.0–8.3)

## 2023-04-15 LAB — CBC
HCT: 46.7 % (ref 39.0–52.0)
Hemoglobin: 15.5 g/dL (ref 13.0–17.0)
MCHC: 33.1 g/dL (ref 30.0–36.0)
MCV: 90.9 fL (ref 78.0–100.0)
Platelets: 163 10*3/uL (ref 150.0–400.0)
RBC: 5.14 Mil/uL (ref 4.22–5.81)
RDW: 13.2 % (ref 11.5–15.5)
WBC: 6.1 10*3/uL (ref 4.0–10.5)

## 2023-04-15 LAB — VITAMIN D 25 HYDROXY (VIT D DEFICIENCY, FRACTURES): VITD: 33.37 ng/mL (ref 30.00–100.00)

## 2023-04-15 LAB — PSA: PSA: 0.89 ng/mL (ref 0.10–4.00)

## 2023-04-15 LAB — FERRITIN: Ferritin: 64.8 ng/mL (ref 22.0–322.0)

## 2023-04-15 LAB — VITAMIN B12: Vitamin B-12: 540 pg/mL (ref 211–911)

## 2023-04-15 LAB — MICROALBUMIN / CREATININE URINE RATIO
Creatinine,U: 131.4 mg/dL
Microalb Creat Ratio: 0.6 mg/g (ref 0.0–30.0)
Microalb, Ur: 0.8 mg/dL (ref 0.0–1.9)

## 2023-04-15 LAB — HEMOGLOBIN A1C: Hgb A1c MFr Bld: 6.2 % (ref 4.6–6.5)

## 2023-04-15 MED ORDER — GABAPENTIN 300 MG PO CAPS
300.0000 mg | ORAL_CAPSULE | Freq: Every day | ORAL | 1 refills | Status: DC
  Filled 2023-04-15: qty 90, 90d supply, fill #0
  Filled 2023-07-10: qty 90, 90d supply, fill #1

## 2023-06-12 DIAGNOSIS — E119 Type 2 diabetes mellitus without complications: Secondary | ICD-10-CM | POA: Diagnosis not present

## 2023-07-02 DIAGNOSIS — Z1211 Encounter for screening for malignant neoplasm of colon: Secondary | ICD-10-CM | POA: Diagnosis not present

## 2023-07-05 NOTE — Progress Notes (Unsigned)
[facility] [address] [place], [place] [address] [phone]  Date:  07/06/2023   Name:  Steven Hodge   DOB:  Oct 10, 1960   MRN:  [id number]  PCP:  Pearline Cables, MD    Chief Complaint: No chief complaint on file.   History of Present Illness:  Steven Hodge is a 63 y.o. very pleasant male patient who presents with the following:  Patient seen today with a concern about his knee Most recent visit with myself was in November  History of hyperlipidemia, diet controlled diabetes, hypertension, gout He underwent gastric bypass surgery in April 2021- has done great since    History of statin intolerance Uses gabapentin at bedtime as needed for muscle stiffness and pain-currently this is this only medication  Lab Results  Component Value Date   HGBA1C 6.2 04/15/2023     Patient Active Problem List   Diagnosis Date Noted   H/O gastric bypass 10/16/2019   Severe obesity (HCC) 10/03/2019   Fatty liver 07/21/2019   Other hyperlipidemia 05/07/2019   Type 2 diabetes mellitus with hyperglycemia, without long-term current use of insulin (HCC) 07/06/2017   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   Vitamin D deficiency 11/18/2016   Essential hypertension 09/18/2016   History of colonic polyps 08/20/2016    Past Medical History:  Diagnosis Date   Arthritis    knees shoulders hands   Back pain    Cancer (HCC) 06/09/2006   Basal cell carcinoma scalp;    Diabetes mellitus without complication (HCC)    Gout    Hx of blood clots    Hypertension     Past Surgical History:  Procedure Laterality Date   basel cell cancer removed  2009   GASTRIC ROUX-EN-Y N/A 10/03/2019   Procedure: LAPAROSCOPIC ROUX-EN-Y GASTRIC BYPASS, HIATAL HERNIA REPARI, WITH UPPER ENDOSCOPY, ERAS Pathway;  Surgeon: Gaynelle Adu, MD;  Location: [facility];  Service: General;  Laterality: N/A;   KNEE SURGERY  2005   ACL  repair R   UMBILICAL HERNIA REPAIR  10/03/2019   Procedure: HERNIA REPAIR UMBILICAL ADULT;  Surgeon: Gaynelle Adu, MD;  Location: [facility];  Service: General;;    Social History   Tobacco Use   Smoking status: Former   Smokeless tobacco: Never  Advertising account planner   Vaping status: Never Used  Substance Use Topics   Alcohol use: No   Drug use: No    Family History  Problem Relation Age of Onset   Diabetes Mother    Liver disease Mother    Obesity Mother    Cancer Father 21       lung cancer   Diabetes Father    Cancer Sister 73       lung cancer   Heart disease Brother 44       cardiac stenting/CAD   Cancer Brother        skin cancer   Arthritis Brother     No Known Allergies  Medication list has been reviewed and updated.  Current Outpatient Medications on File Prior to Visit  Medication Sig Dispense Refill   blood glucose meter kit and supplies Dispense based on patient and insurance preference. Use up to four times daily as directed. (FOR ICD-9 250.00, 250.01). 1 each 0   gabapentin (NEURONTIN) 300 MG capsule Take 1 capsule (300 mg total) by mouth at bedtime. Use as needed for back pain  90 capsule 1   Glucosamine 500 MG CAPS 1,000 mg.     glucose blood (CONTOUR NEXT TEST) test strip Use as instructed- check glucose up to once a day as needed 100 strip 2   Multiple Vitamin (MULTIVITAMIN) tablet Take 1 tablet by mouth daily.     Turmeric 400 MG CAPS Take 1 capsule by mouth daily.     No current facility-administered medications on file prior to visit.    Review of Systems:  As per HPI- otherwise negative.   Physical Examination: There were no vitals filed for this visit. There were no vitals filed for this visit. There is no height or weight on file to calculate BMI. Ideal Body Weight:    GEN: no acute distress. HEENT: Atraumatic, Normocephalic.  Ears and Nose: No external deformity. CV: RRR, No M/G/R. No JVD. No thrill. No extra heart sounds. PULM: CTA B, no  wheezes, crackles, rhonchi. No retractions. No resp. distress. No accessory muscle use. ABD: S, NT, ND, +BS. No rebound. No HSM. EXTR: No c/c/e PSYCH: Normally interactive. Conversant.    Assessment and Plan: ***  Signed Abbe Amsterdam, MD

## 2023-07-06 ENCOUNTER — Ambulatory Visit (INDEPENDENT_AMBULATORY_CARE_PROVIDER_SITE_OTHER): Payer: BC Managed Care – PPO | Admitting: Family Medicine

## 2023-07-06 VITALS — BP 140/80 | HR 82 | Temp 98.0°F | Resp 18 | Ht 68.0 in | Wt 226.6 lb

## 2023-07-06 DIAGNOSIS — M25561 Pain in right knee: Secondary | ICD-10-CM | POA: Diagnosis not present

## 2023-07-06 NOTE — Patient Instructions (Addendum)
You have an appt to see Dr Madelon Lips tomorrow at 8:45- arrive 15 minutes early for paperwork.  Let me know if I can do anything else to help with your knee!    [facility] [address] [place], [place] [address] Phone: [phone]

## 2023-07-07 DIAGNOSIS — M25561 Pain in right knee: Secondary | ICD-10-CM | POA: Diagnosis not present

## 2023-07-10 ENCOUNTER — Other Ambulatory Visit: Payer: Self-pay

## 2023-07-14 DIAGNOSIS — M25561 Pain in right knee: Secondary | ICD-10-CM | POA: Diagnosis not present

## 2023-07-21 DIAGNOSIS — M25561 Pain in right knee: Secondary | ICD-10-CM | POA: Diagnosis not present

## 2023-07-27 ENCOUNTER — Encounter: Payer: Self-pay | Admitting: Family Medicine

## 2023-07-28 NOTE — Patient Instructions (Incomplete)
It was great to see you today, best of luck with your upcoming knee surgery

## 2023-07-28 NOTE — Progress Notes (Deleted)
 [facility] [address], [address] [place], [place] [address] [phone]  Date:  07/29/2023   Name:  Steven Hodge   DOB:  03-05-1961   MRN:  [id number]  PCP:  Pearline Cables, MD    Chief Complaint: No chief complaint on file.   History of Present Illness:  Steven Hodge is a 63 y.o. very pleasant male patient who presents with the following:  Patient seen today for preoperative visit, he plans to have knee surgery soon  I saw him just recently for knee pain-otherwise our most recent routine visit was in November History of hyperlipidemia, diet controlled diabetes, hypertension, gout He underwent gastric bypass surgery in April 2021- has done great since    History of statin intolerance Uses gabapentin at bedtime as needed for muscle stiffness and pain-currently this is this only medication He notes this is helping a lot with his pain and his sleep-   Lab Results  Component Value Date   HGBA1C 6.2 04/15/2023  '  Patient Active Problem List   Diagnosis Date Noted   H/O gastric bypass 10/16/2019   Severe obesity (HCC) 10/03/2019   Fatty liver 07/21/2019   Other hyperlipidemia 05/07/2019   Type 2 diabetes mellitus with hyperglycemia, without long-term current use of insulin (HCC) 07/06/2017   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   Vitamin D deficiency 11/18/2016   Essential hypertension 09/18/2016   History of colonic polyps 08/20/2016    Past Medical History:  Diagnosis Date   Arthritis    knees shoulders hands   Back pain    Cancer (HCC) 06/09/2006   Basal cell carcinoma scalp;    Diabetes mellitus without complication (HCC)    Gout    Hx of blood clots    Hypertension     Past Surgical History:  Procedure Laterality Date   basel cell cancer removed  2009   GASTRIC ROUX-EN-Y N/A 10/03/2019   Procedure: LAPAROSCOPIC ROUX-EN-Y GASTRIC BYPASS, HIATAL HERNIA REPARI, WITH UPPER  ENDOSCOPY, ERAS Pathway;  Surgeon: Gaynelle Adu, MD;  Location: [facility];  Service: General;  Laterality: N/A;   KNEE SURGERY  2005   ACL repair R   UMBILICAL HERNIA REPAIR  10/03/2019   Procedure: HERNIA REPAIR UMBILICAL ADULT;  Surgeon: Gaynelle Adu, MD;  Location: [facility];  Service: General;;    Social History   Tobacco Use   Smoking status: Former   Smokeless tobacco: Never  Advertising account planner   Vaping status: Never Used  Substance Use Topics   Alcohol use: No   Drug use: No    Family History  Problem Relation Age of Onset   Diabetes Mother    Liver disease Mother    Obesity Mother    Cancer Father 53       lung cancer   Diabetes Father    Cancer Sister 44       lung cancer   Heart disease Brother 60       cardiac stenting/CAD   Cancer Brother        skin cancer   Arthritis Brother     No Known Allergies  Medication list has been reviewed and updated.  Current Outpatient Medications on File Prior to Visit  Medication Sig Dispense Refill   blood glucose meter kit and supplies Dispense based on patient and insurance preference. Use up to four times daily as directed. (FOR ICD-9 250.00, 250.01). 1  each 0   gabapentin (NEURONTIN) 300 MG capsule Take 1 capsule (300 mg total) by mouth at bedtime. Use as needed for back pain 90 capsule 1   Glucosamine 500 MG CAPS 1,000 mg.     glucose blood (CONTOUR NEXT TEST) test strip Use as instructed- check glucose up to once a day as needed 100 strip 2   Multiple Vitamin (MULTIVITAMIN) tablet Take 1 tablet by mouth daily.     Turmeric 400 MG CAPS Take 1 capsule by mouth daily.     No current facility-administered medications on file prior to visit.    Review of Systems:  As per HPI- otherwise negative.   Physical Examination: There were no vitals filed for this visit. There were no vitals filed for this visit. There is no height or weight on file to calculate BMI. Ideal Body Weight:    GEN: no acute distress. HEENT: Atraumatic,  Normocephalic.  Ears and Nose: No external deformity. CV: RRR, No M/G/R. No JVD. No thrill. No extra heart sounds. PULM: CTA B, no wheezes, crackles, rhonchi. No retractions. No resp. distress. No accessory muscle use. ABD: S, NT, ND, +BS. No rebound. No HSM. EXTR: No c/c/e PSYCH: Normally interactive. Conversant.    Assessment and Plan: ***  Signed Abbe Amsterdam, MD

## 2023-07-29 ENCOUNTER — Ambulatory Visit: Payer: BC Managed Care – PPO | Admitting: Family Medicine

## 2023-07-30 NOTE — Progress Notes (Signed)
 [facility] [address], [address] [place], [place] [address] [phone]  Date:  08/03/2023   Name:  Steven Hodge   DOB:  1960-11-09   MRN:  [id number]  PCP:  Pearline Cables, MD    Chief Complaint: Pre-op Exam   History of Present Illness:  Steven Hodge is a 63 y.o. very pleasant male patient who presents with the following:  Patient seen today for preoperative visit, he plans to have knee surgery soon They plan to do a TJA  I saw him just recently for knee pain-otherwise our most recent routine visit was in November History of hyperlipidemia, diet controlled diabetes, hypertension, gout He underwent gastric bypass surgery in April 2021- has done great since    History of statin intolerance Uses gabapentin at bedtime as needed for muscle stiffness and pain-currently this is this only medication He notes this is helping a lot with his pain and his sleep-   Lab Results  Component Value Date   HGBA1C 6.2 04/15/2023   EKG-most recent in 2021, needs update  He does regular exercise- no CP or SOB with exercise Former smoker- he quit in his 72s  He plans to have his knee surgery in May or late April at the earliest - he had a steroid shot and has to wait 3 months after this  Patient Active Problem List   Diagnosis Date Noted   H/O gastric bypass 10/16/2019   Severe obesity (HCC) 10/03/2019   Fatty liver 07/21/2019   Other hyperlipidemia 05/07/2019   Type 2 diabetes mellitus with hyperglycemia, without long-term current use of insulin (HCC) 07/06/2017   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   Vitamin D deficiency 11/18/2016   Essential hypertension 09/18/2016   History of colonic polyps 08/20/2016    Past Medical History:  Diagnosis Date   Arthritis    knees shoulders hands   Back pain    Cancer (HCC) 06/09/2006   Basal cell carcinoma scalp;    Diabetes mellitus without  complication (HCC)    Gout    Hx of blood clots    Hypertension     Past Surgical History:  Procedure Laterality Date   basel cell cancer removed  2009   GASTRIC ROUX-EN-Y N/A 10/03/2019   Procedure: LAPAROSCOPIC ROUX-EN-Y GASTRIC BYPASS, HIATAL HERNIA REPARI, WITH UPPER ENDOSCOPY, ERAS Pathway;  Surgeon: Gaynelle Adu, MD;  Location: [facility];  Service: General;  Laterality: N/A;   KNEE SURGERY  2005   ACL repair R   UMBILICAL HERNIA REPAIR  10/03/2019   Procedure: HERNIA REPAIR UMBILICAL ADULT;  Surgeon: Gaynelle Adu, MD;  Location: [facility];  Service: General;;    Social History   Tobacco Use   Smoking status: Former   Smokeless tobacco: Never  Advertising account planner   Vaping status: Never Used  Substance Use Topics   Alcohol use: No   Drug use: No    Family History  Problem Relation Age of Onset   Diabetes Mother    Liver disease Mother    Obesity Mother    Cancer Father 38       lung cancer   Diabetes Father    Cancer Sister 104       lung cancer   Heart disease Brother 53       cardiac stenting/CAD   Cancer Brother        skin cancer   Arthritis Brother  No Known Allergies  Medication list has been reviewed and updated.  Current Outpatient Medications on File Prior to Visit  Medication Sig Dispense Refill   blood glucose meter kit and supplies Dispense based on patient and insurance preference. Use up to four times daily as directed. (FOR ICD-9 250.00, 250.01). 1 each 0   gabapentin (NEURONTIN) 300 MG capsule Take 1 capsule (300 mg total) by mouth at bedtime. Use as needed for back pain 90 capsule 1   Glucosamine 500 MG CAPS 1,000 mg.     glucose blood (CONTOUR NEXT TEST) test strip Use as instructed- check glucose up to once a day as needed 100 strip 2   Multiple Vitamin (MULTIVITAMIN) tablet Take 1 tablet by mouth daily.     Turmeric 400 MG CAPS Take 1 capsule by mouth daily.     No current facility-administered medications on file prior to visit.    Review of  Systems:  As per HPI- otherwise negative.   Physical Examination: Vitals:   08/03/23 0816  BP: 124/78  Pulse: 67  SpO2: 100%   Vitals:   08/03/23 0816  Weight: 225 lb 6.4 oz (102.2 kg)  Height: 5\' 8"  (1.727 m)   Body mass index is 34.27 kg/m. Ideal Body Weight: Weight in (lb) to have BMI = 25: 164.1  GEN: no acute distress. Mild obesity, looks well  HEENT: Atraumatic, Normocephalic.   Ears and Nose: No external deformity. CV: RRR, No M/G/R. No JVD. No thrill. No extra heart sounds. PULM: CTA B, no wheezes, crackles, rhonchi. No retractions. No resp. distress. No accessory muscle use. ABD: S, NT, ND EXTR: No c/c/e PSYCH: Normally interactive. Conversant.   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  I suspect all is well, but he has a new we discovered left bundle branch block.  I will refer to cardiology for further evaluation prior to cardiac clearance for his upcoming operation  Blood pressure under good control  Recent A1c showed good control diabetes Signed Abbe Amsterdam, MD

## 2023-07-30 NOTE — Patient Instructions (Addendum)
 It was good to see you today, best of luck with your upcoming knee surgery!  We will get you seen by cardiology to make sure all is well.  As we discussed, you have a new finding on your EKG- a "left bundle branch block."  This is a common finding especially as we get older and is probably not indicative of anything dangerous.  However I suspect cardiology may want to do an echocardiogram for you to make sure all is good for surgery

## 2023-08-03 ENCOUNTER — Ambulatory Visit (INDEPENDENT_AMBULATORY_CARE_PROVIDER_SITE_OTHER): Payer: BC Managed Care – PPO | Admitting: Family Medicine

## 2023-08-03 ENCOUNTER — Encounter: Payer: Self-pay | Admitting: Family Medicine

## 2023-08-03 VITALS — BP 124/78 | HR 67 | Ht 68.0 in | Wt 225.4 lb

## 2023-08-03 DIAGNOSIS — Z01818 Encounter for other preprocedural examination: Secondary | ICD-10-CM

## 2023-08-03 DIAGNOSIS — E1165 Type 2 diabetes mellitus with hyperglycemia: Secondary | ICD-10-CM | POA: Diagnosis not present

## 2023-08-03 DIAGNOSIS — E7849 Other hyperlipidemia: Secondary | ICD-10-CM

## 2023-08-03 DIAGNOSIS — I10 Essential (primary) hypertension: Secondary | ICD-10-CM

## 2023-08-03 DIAGNOSIS — I447 Left bundle-branch block, unspecified: Secondary | ICD-10-CM | POA: Diagnosis not present

## 2023-08-04 ENCOUNTER — Telehealth: Payer: Self-pay

## 2023-08-04 NOTE — Telephone Encounter (Signed)
   Name: Steven Hodge  DOB: Apr 27, 1961  MRN: [id number]  Primary Cardiologist: None  Chart reviewed as part of pre-operative protocol coverage. The patient has an upcoming visit scheduled with Dr. Tomie China on 09/17/23 at which time clearance can be addressed in case there are any issues that would impact surgical recommendations.   I added preop FYI to appointment note so that provider is aware to address at time of outpatient visit.  Per office protocol the cardiology provider should forward their finalized clearance decision and recommendations regarding antiplatelet therapy to the requesting party below.    I will route this message as FYI to requesting party and remove this message from the preop box as separate preop APP input not needed at this time.   Please call with any questions.  Napoleon Form, Leodis Rains, NP  08/04/2023, 3:36 PM

## 2023-08-04 NOTE — Telephone Encounter (Signed)
   Pre-operative Risk Assessment    Patient Name: Steven Hodge  DOB: 09/16/1960 MRN: [id number]   Date of last office visit: N/A Date of next office visit: 09-17-23   Request for Surgical Clearance    Procedure:   right total knee arthroplasty   Date of Surgery:  Clearance TBD                                Surgeon:  Dr. Weber Cooks Surgeon's Group or Practice Name:  [facility] Phone number:  [phone] Fax number:  [phone]   Type of Clearance Requested:   - Medical    Type of Anesthesia:  Spinal   Additional requests/questions:    SignedDione Housekeeper   08/04/2023, 3:13 PM

## 2023-08-14 DIAGNOSIS — D125 Benign neoplasm of sigmoid colon: Secondary | ICD-10-CM | POA: Diagnosis not present

## 2023-08-14 DIAGNOSIS — Z860101 Personal history of adenomatous and serrated colon polyps: Secondary | ICD-10-CM | POA: Diagnosis not present

## 2023-08-14 DIAGNOSIS — K6389 Other specified diseases of intestine: Secondary | ICD-10-CM | POA: Diagnosis not present

## 2023-08-14 DIAGNOSIS — Z860100 Personal history of colon polyps, unspecified: Secondary | ICD-10-CM | POA: Diagnosis not present

## 2023-08-14 DIAGNOSIS — K648 Other hemorrhoids: Secondary | ICD-10-CM | POA: Diagnosis not present

## 2023-08-14 DIAGNOSIS — Z1211 Encounter for screening for malignant neoplasm of colon: Secondary | ICD-10-CM | POA: Diagnosis not present

## 2023-08-14 DIAGNOSIS — D122 Benign neoplasm of ascending colon: Secondary | ICD-10-CM | POA: Diagnosis not present

## 2023-08-14 DIAGNOSIS — K635 Polyp of colon: Secondary | ICD-10-CM | POA: Diagnosis not present

## 2023-08-18 LAB — HM COLONOSCOPY

## 2023-09-03 ENCOUNTER — Ambulatory Visit: Attending: Cardiology | Admitting: Cardiology

## 2023-09-03 ENCOUNTER — Other Ambulatory Visit: Payer: Self-pay

## 2023-09-03 ENCOUNTER — Encounter (HOSPITAL_COMMUNITY): Payer: Self-pay

## 2023-09-03 VITALS — BP 136/74 | HR 63 | Ht 68.0 in | Wt 224.0 lb

## 2023-09-03 DIAGNOSIS — Z01810 Encounter for preprocedural cardiovascular examination: Secondary | ICD-10-CM | POA: Diagnosis not present

## 2023-09-03 DIAGNOSIS — E119 Type 2 diabetes mellitus without complications: Secondary | ICD-10-CM | POA: Insufficient documentation

## 2023-09-03 DIAGNOSIS — I447 Left bundle-branch block, unspecified: Secondary | ICD-10-CM | POA: Diagnosis not present

## 2023-09-03 DIAGNOSIS — I10 Essential (primary) hypertension: Secondary | ICD-10-CM | POA: Insufficient documentation

## 2023-09-03 DIAGNOSIS — M549 Dorsalgia, unspecified: Secondary | ICD-10-CM | POA: Insufficient documentation

## 2023-09-03 DIAGNOSIS — R011 Cardiac murmur, unspecified: Secondary | ICD-10-CM | POA: Insufficient documentation

## 2023-09-03 DIAGNOSIS — M109 Gout, unspecified: Secondary | ICD-10-CM | POA: Insufficient documentation

## 2023-09-03 DIAGNOSIS — M1990 Unspecified osteoarthritis, unspecified site: Secondary | ICD-10-CM | POA: Insufficient documentation

## 2023-09-03 DIAGNOSIS — E66811 Obesity, class 1: Secondary | ICD-10-CM | POA: Insufficient documentation

## 2023-09-03 DIAGNOSIS — I700 Atherosclerosis of aorta: Secondary | ICD-10-CM | POA: Insufficient documentation

## 2023-09-03 DIAGNOSIS — Z86718 Personal history of other venous thrombosis and embolism: Secondary | ICD-10-CM | POA: Insufficient documentation

## 2023-09-03 NOTE — Patient Instructions (Signed)
 Medication Instructions:  Your physician recommends that you continue on your current medications as directed. Please refer to the Current Medication list given to you today.   *If you need a refill on your cardiac medications before your next appointment, please call your pharmacy*   Lab Work: None ordered If you have labs (blood work) drawn today and your tests are completely normal, you will receive your results only by: MyChart Message (if you have MyChart) OR A paper copy in the mail If you have any lab test that is abnormal or we need to change your treatment, we will call you to review the results.   Testing/Procedures:   [facility] [address], [address] [place], [place] [address] Phone: [phone]    Please arrive 15 minutes prior to your appointment time for registration and insurance purposes.  The test will take approximately 3 to 4 hours to complete; you may bring reading material.  If someone comes with you to your appointment, they will need to remain in the main lobby due to limited space in the testing area. **If you are pregnant or breastfeeding, please notify the nuclear lab prior to your appointment**  How to prepare for your Myocardial Perfusion Test: Do not eat or drink 3 hours prior to your test, except you may have water. Do not consume products containing caffeine (regular or decaffeinated) 12 hours prior to your test. (ex: coffee, chocolate, sodas, tea). Do bring a list of your current medications with you.  If not listed below, you may take your medications as normal. Do wear comfortable clothes (no dresses or overalls) and walking shoes, tennis shoes preferred (No heels or open toe shoes are allowed). Do NOT wear cologne, perfume, aftershave, or lotions (deodorant is allowed). If these instructions are not followed, your test will have to be rescheduled.  If you cannot keep your appointment, please  provide 24 hours notification to the Nuclear Lab, to avoid a possible $50 charge to your account. Please report to [address], [address] for your test.  If you have questions or concerns about your appointment, you can call the Nuclear Lab at [phone].   If you cannot keep your appointment, please provide 24 hours notification to the Nuclear Lab, to avoid a possible $50 charge to your account.    Your physician has requested that you have an echocardiogram. Echocardiography is a painless test that uses sound waves to create images of your heart. It provides your doctor with information about the size and shape of your heart and how well your heart's chambers and valves are working. This procedure takes approximately one hour. There are no restrictions for this procedure. Please do NOT wear cologne, perfume, aftershave, or lotions (deodorant is allowed). Please arrive 15 minutes prior to your appointment time.  Please note: We ask at that you not bring children with you during ultrasound (echo/ vascular) testing. Due to room size and safety concerns, children are not allowed in the ultrasound rooms during exams. Our front office staff cannot provide observation of children in our lobby area while testing is being conducted. An adult accompanying a patient to their appointment will only be allowed in the ultrasound room at the discretion of the ultrasound technician under special circumstances. We apologize for any inconvenience.    Follow-Up: At [facility], you and your health needs are our priority.  As part of our continuing mission to provide you with exceptional heart  care, we have created designated Provider Care Teams.  These Care Teams include your primary Cardiologist (physician) and Advanced Practice Providers (APPs -  Physician Assistants and Nurse Practitioners) who all work together to provide you with the care you need, when you need it.  We recommend signing up  for the patient portal called "MyChart".  Sign up information is provided on this After Visit Summary.  MyChart is used to connect with patients for Virtual Visits (Telemedicine).  Patients are able to view lab/test results, encounter notes, upcoming appointments, etc.  Non-urgent messages can be sent to your provider as well.   To learn more about what you can do with MyChart, go to [email or web address].    Your next appointment:   3 month(s)  Provider:   Belva Crome, MD   Other Instructions  Cardiac Nuclear Scan A cardiac nuclear scan is a test that is done to check the flow of blood to your heart. It is done when you are resting and when you are exercising. The test looks for problems such as: Not enough blood reaching a portion of the heart. The heart muscle not working as it should. You may need this test if you have: Heart disease. Lab results that are not normal. Had heart surgery or a balloon procedure to open up blocked arteries (angioplasty) or a small mesh tube (stent). Chest pain. Shortness of breath. Had a heart attack. In this test, a special dye (tracer) is put into your bloodstream. The tracer will travel to your heart. A camera will then take pictures of your heart to see how the tracer moves through your heart. This test is usually done at a hospital and takes 2-4 hours. Tell a doctor about: Any allergies you have. All medicines you are taking, including vitamins, herbs, eye drops, creams, and over-the-counter medicines. Any bleeding problems you have. Any surgeries you have had. Any medical conditions you have. Whether you are pregnant or may be pregnant. Any history of asthma or long-term (chronic) lung disease. Any history of heart rhythm disorders or heart valve conditions. What are the risks? Your doctor will talk with you about risks. These may include: Serious chest pain and heart attack. This is only a risk if the stress portion of the test is  done. Fast or uneven heartbeats (palpitations). A feeling of warmth in your chest. This feeling usually does not last long. Allergic reaction to the tracer. Shortness of breath or trouble breathing. What happens before the test? Ask your doctor about changing or stopping your normal medicines. Follow instructions from your doctor about what you cannot eat or drink. Remove your jewelry on the day of the test. Ask your doctor if you need to avoid nicotine or caffeine. What happens during the test? An IV tube will be inserted into one of your veins. Your doctor will give you a small amount of tracer through the IV tube. You will wait for 20-40 minutes while the tracer moves through your bloodstream. Your heart will be monitored with an electrocardiogram (ECG). You will lie down on an exam table. Pictures of your heart will be taken for about 15-20 minutes. You may also have a stress test. For this test, one of these things may be done: You will be asked to exercise on a treadmill or a stationary bike. You will be given medicines that will make your heart work harder. This is done if you are unable to exercise. When blood flow to your heart has peaked, a  tracer will again be given through the IV tube. After 20-40 minutes, you will get back on the exam table. More pictures will be taken of your heart. Depending on the tracer that is used, more pictures may need to be taken 3-4 hours later. Your IV tube will be removed when the test is over. The test may vary among doctors and hospitals. What happens after the test? Ask your doctor: Whether you can return to your normal schedule, including diet, activities, travel, and medicines. Whether you should drink more fluids. This will help to remove the tracer from your body. Ask your doctor, or the department that is doing the test: When will my results be ready? How will I get my results? What are my treatment options? What other tests do I  need? What are my next steps? This information is not intended to replace advice given to you by your health care provider. Make sure you discuss any questions you have with your health care provider. Document Revised: 10/22/2021 Document Reviewed: 10/22/2021 Elsevier Patient Education  2023 Elsevier Inc.  Echocardiogram An echocardiogram is a test that uses sound waves (ultrasound) to produce images of the heart. Images from an echocardiogram can provide important information about: Heart size and shape. The size and thickness and movement of your heart's walls. Heart muscle function and strength. Heart valve function or if you have stenosis. Stenosis is when the heart valves are too narrow. If blood is flowing backward through the heart valves (regurgitation). A tumor or infectious growth around the heart valves. Areas of heart muscle that are not working well because of poor blood flow or injury from a heart attack. Aneurysm detection. An aneurysm is a weak or damaged part of an artery wall. The wall bulges out from the normal force of blood pumping through the body. Tell a health care provider about: Any allergies you have. All medicines you are taking, including vitamins, herbs, eye drops, creams, and over-the-counter medicines. Any blood disorders you have. Any surgeries you have had. Any medical conditions you have. Whether you are pregnant or may be pregnant. What are the risks? Generally, this is a safe test. However, problems may occur, including an allergic reaction to dye (contrast) that may be used during the test. What happens before the test? No specific preparation is needed. You may eat and drink normally. What happens during the test?  You will take off your clothes from the waist up and put on a hospital gown. Electrodes or electrocardiogram (ECG)patches may be placed on your chest. The electrodes or patches are then connected to a device that monitors your heart  rate and rhythm. You will lie down on a table for an ultrasound exam. A gel will be applied to your chest to help sound waves pass through your skin. A handheld device, called a transducer, will be pressed against your chest and moved over your heart. The transducer produces sound waves that travel to your heart and bounce back (or "echo" back) to the transducer. These sound waves will be captured in real-time and changed into images of your heart that can be viewed on a video monitor. The images will be recorded on a computer and reviewed by your health care provider. You may be asked to change positions or hold your breath for a short time. This makes it easier to get different views or better views of your heart. In some cases, you may receive contrast through an IV in one of your veins.  This can improve the quality of the pictures from your heart. The procedure may vary among health care providers and hospitals. What can I expect after the test? You may return to your normal, everyday life, including diet, activities, and medicines, unless your health care provider tells you not to do that. Follow these instructions at home: It is up to you to get the results of your test. Ask your health care provider, or the department that is doing the test, when your results will be ready. Keep all follow-up visits. This is important. Summary An echocardiogram is a test that uses sound waves (ultrasound) to produce images of the heart. Images from an echocardiogram can provide important information about the size and shape of your heart, heart muscle function, heart valve function, and other possible heart problems. You do not need to do anything to prepare before this test. You may eat and drink normally. After the echocardiogram is completed, you may return to your normal, everyday life, unless your health care provider tells you not to do that. This information is not intended to replace advice given to  you by your health care provider. Make sure you discuss any questions you have with your health care provider. Document Revised: 02/06/2021 Document Reviewed: 01/17/2020 Elsevier Patient Education  2023 Elsevier Inc.    Important Information About Sugar

## 2023-09-03 NOTE — Progress Notes (Addendum)
 Cardiology Office Note:    Date:  09/03/2023   ID:  Steven Hodge, DOB 13-Feb-1961, MRN [id number]  PCP:  Pearline Cables, MD  Cardiologist:  Garwin Brothers, MD   Referring MD: Pearline Cables, MD    ASSESSMENT:    1. Essential hypertension   2. Aortic atherosclerosis (HCC)   3. Preop cardiovascular exam   4. Left bundle branch block   5. Obesity (BMI 30.0-34.9)   6. Cardiac murmur    PLAN:    In order of problems listed above:  Aortic atherosclerosis: I discussed my findings with the patient at length and secondary prevention stressed Preoperative cardiovascular evaluation: Patient has multiple risk factors for coronary artery disease and therefore I suggested Lexiscan sestamibi and he is agreeable.  If the test is negative he is not at high risk for coronary events during the aforementioned surgery.  Meticulous hemodynamic monitoring will further reduce the risk of coronary events. Cardiac murmur: Echocardiogram will be done to assess murmur heard on auscultation. History of hypertension: Stable blood pressure at this time without any medications.  Lifestyle modification urged. Obesity: Weight reduction stressed diet emphasized and he promises to do better.  Risks of obesity explained. Mixed dyslipidemia: He is willing to try statins after the surgery and once he is already stopped.  He will increase in view of aortic atherosclerosis.  Goal LDL less than 60. Patient will be seen in follow-up appointment in 6 months or earlier if the patient has any concerns.   Addendum: 09/09/2023  Patient under went to stress testing.  Please review report for full details.  Test did not reveal any evidence of ischemia.  Ejection fraction is preserved.  In view of this he is not at high risk for coronary events during the aforementioned surgery.  As mentioned above meticulous hemodynamic monitoring will further reduce the risk of coronary events.  Please do not hesitate to call us if there  are any questions about his cardiovascular management. Signed Dr. Glean Hess Kayle Correa     Medication Adjustments/Labs and Tests Ordered: Current medicines are reviewed at length with the patient today.  Concerns regarding medicines are outlined above.  Orders Placed This Encounter  Procedures   MYOCARDIAL PERFUSION IMAGING   EKG 12-Lead   ECHOCARDIOGRAM COMPLETE   No orders of the defined types were placed in this encounter.    History of Present Illness:    Steven Hodge is a 63 y.o. male who is being seen today for the evaluation of preoperative cardiovascular at the request of Copland, Gwenlyn Found, MD. patient is a pleasant 63 year old male.  He has past medical history of essential hypertension.  He has left bundle branch block on the EKG.  He leads a sedentary lifestyle because of orthopedic issues.  He is obese.  He is planning to undergo knee replacement and therefore he is referred here.  He also has aortic atherosclerosis of the abdominal aorta.  He denies any chest pain orthopnea or PND.  At the time of my evaluation, the patient is alert awake oriented and in no distress. Past Medical History:  Diagnosis Date   Arthritis    knees shoulders hands   Back pain    Cancer (HCC) 06/09/2006   Basal cell carcinoma scalp;    Diabetes mellitus without complication (HCC)    Essential hypertension 09/18/2016   Fatty liver 07/21/2019   Gout    H/O gastric bypass 10/16/2019   History of colonic polyps 08/20/2016  Hx of blood clots    Hypertension    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   Other hyperlipidemia 05/07/2019   Severe obesity (HCC) 10/03/2019   Type 2 diabetes mellitus with hyperglycemia, without long-term current use of insulin (HCC) 07/06/2017   Vitamin D deficiency 11/18/2016    Past Surgical History:  Procedure Laterality Date   basel cell cancer removed  2009   GASTRIC ROUX-EN-Y N/A 10/03/2019   Procedure: LAPAROSCOPIC ROUX-EN-Y GASTRIC  BYPASS, HIATAL HERNIA REPARI, WITH UPPER ENDOSCOPY, ERAS Pathway;  Surgeon: Gaynelle Adu, MD;  Location: [facility];  Service: General;  Laterality: N/A;   KNEE SURGERY  2005   ACL repair R   UMBILICAL HERNIA REPAIR  10/03/2019   Procedure: HERNIA REPAIR UMBILICAL ADULT;  Surgeon: Gaynelle Adu, MD;  Location: [facility];  Service: General;;    Current Medications: Current Meds  Medication Sig   blood glucose meter kit and supplies Dispense based on patient and insurance preference. Use up to four times daily as directed. (FOR ICD-9 250.00, 250.01).   gabapentin (NEURONTIN) 300 MG capsule Take 1 capsule (300 mg total) by mouth at bedtime. Use as needed for back pain   Glucosamine 500 MG CAPS Take 1,000 mg by mouth daily.   glucose blood (CONTOUR NEXT TEST) test strip Use as instructed- check glucose up to once a day as needed   Multiple Vitamin (MULTIVITAMIN) tablet Take 1 tablet by mouth daily.   Turmeric 400 MG CAPS Take 1 capsule by mouth daily.     Allergies:   Patient has no known allergies.   Social History   Socioeconomic History   Marital status: Married    Spouse name: Not on file   Number of children: Not on file   Years of education: Not on file   Highest education level: 12th grade  Occupational History   Occupation: Investment banker, operational, Curator  Tobacco Use   Smoking status: Former   Smokeless tobacco: Never  Advertising account planner   Vaping status: Never Used  Substance and Sexual Activity   Alcohol use: No   Drug use: No   Sexual activity: Yes  Other Topics Concern   Not on file  Social History Narrative   Marital status: married x 32 years       Children:  None       Lives: with wife, 3 dogs       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  Walking once per week in 2018.         Social Drivers of Corporate investment banker Strain: Low Risk   (07/06/2023)   Overall Financial Resource Strain (CARDIA)    Difficulty of Paying Living Expenses: Not hard at all  Food Insecurity: No Food Insecurity (07/06/2023)   Hunger Vital Sign    Worried About Running Out of Food in the Last Year: Never true    Ran Out of Food in the Last Year: Never true  Transportation Needs: No Transportation Needs (07/06/2023)   PRAPARE - Administrator, Civil Service (Medical): No    Lack of Transportation (Non-Medical): No  Physical Activity: Sufficiently Active (07/06/2023)   Exercise Vital Sign    Days of Exercise per Week: 5 days  Minutes of Exercise per Session: 40 min  Stress: No Stress Concern Present (07/06/2023)   [employer] of Occupational Health - Occupational Stress Questionnaire    Feeling of Stress : Not at all  Social Connections: Socially Integrated (07/06/2023)   Social Connection and Isolation Panel [NHANES]    Frequency of Communication with Friends and Family: More than three times a week    Frequency of Social Gatherings with Friends and Family: Three times a week    Attends Religious Services: More than 4 times per year    Active Member of Clubs or Organizations: Yes    Attends Engineer, structural: More than 4 times per year    Marital Status: Married     Family History: The patient's family history includes Arthritis in his brother; Cancer in his brother; Cancer (age of onset: 26) in his sister; Cancer (age of onset: 56) in his father; Diabetes in his father and mother; Heart disease (age of onset: 73) in his brother; Liver disease in his mother; Obesity in his mother.  ROS:   Please see the history of present illness.    All other systems reviewed and are negative.  EKGs/Labs/Other Studies Reviewed:    The following studies were reviewed today:  EKG Interpretation Date/Time:  Thursday September 03 2023 14:39:11 EDT Ventricular Rate:  63 PR Interval:  182 QRS Duration:  152 QT Interval:  428 QTC  Calculation: 437 R Axis:   29  Text Interpretation: Normal sinus rhythm Left bundle branch block When compared with ECG of 21-Jul-2019 09:42, Left bundle branch block is now Present Minimal criteria for Anterior infarct are no longer Present Confirmed by Belva Crome [phone]) on 09/03/2023 2:53:49 PM     Recent Labs: 04/15/2023: ALT 26; BUN 15; Creatinine, Ser 0.86; Hemoglobin 15.5; Platelets 163.0; Potassium 4.5; Sodium 141  Recent Lipid Panel    Component Value Date/Time   CHOL 179 04/15/2023 0831   CHOL 207 (H) 05/02/2019 1627   TRIG 100.0 04/15/2023 0831   HDL 52.20 04/15/2023 0831   HDL 44 05/02/2019 1627   CHOLHDL 3 04/15/2023 0831   VLDL 20.0 04/15/2023 0831   LDLCALC 107 (H) 04/15/2023 0831   LDLCALC 89 03/15/2020 1632    Physical Exam:    VS:  BP 136/74   Pulse 63   Ht 5\' 8"  (1.727 m)   Wt 224 lb (101.6 kg)   SpO2 95%   BMI 34.06 kg/m     Wt Readings from Last 3 Encounters:  09/03/23 224 lb (101.6 kg)  08/03/23 225 lb 6.4 oz (102.2 kg)  07/06/23 226 lb 9.6 oz (102.8 kg)     GEN: Patient is in no acute distress HEENT: Normal NECK: No JVD; No carotid bruits LYMPHATICS: No lymphadenopathy CARDIAC: S1 S2 regular, 2/6 systolic murmur at the apex. RESPIRATORY:  Clear to auscultation without rales, wheezing or rhonchi  ABDOMEN: Soft, non-tender, non-distended MUSCULOSKELETAL:  No edema; No deformity  SKIN: Warm and dry NEUROLOGIC:  Alert and oriented x 3 PSYCHIATRIC:  Normal affect    Signed, Garwin Brothers, MD  09/03/2023 3:17 PM    [facility]

## 2023-09-08 ENCOUNTER — Ambulatory Visit: Attending: Cardiology

## 2023-09-08 DIAGNOSIS — Z01810 Encounter for preprocedural cardiovascular examination: Secondary | ICD-10-CM | POA: Diagnosis not present

## 2023-09-08 LAB — MYOCARDIAL PERFUSION IMAGING
LV dias vol: 117 mL (ref 62–150)
LV sys vol: 52 mL
Nuc Stress EF: 55 %
Peak HR: 86 {beats}/min
Rest HR: 59 {beats}/min
Rest Nuclear Isotope Dose: 10.5 mCi
SDS: 10
SRS: 4
SSS: 14
ST Depression (mm): 0 mm
Stress Nuclear Isotope Dose: 30.9 mCi
TID: 0.98

## 2023-09-08 MED ORDER — TECHNETIUM TC 99M TETROFOSMIN IV KIT
30.9000 | PACK | Freq: Once | INTRAVENOUS | Status: AC | PRN
  Administered 2023-09-08: 30.9 via INTRAVENOUS

## 2023-09-08 MED ORDER — TECHNETIUM TC 99M TETROFOSMIN IV KIT
10.5000 | PACK | Freq: Once | INTRAVENOUS | Status: AC | PRN
  Administered 2023-09-08: 10.5 via INTRAVENOUS

## 2023-09-08 MED ORDER — REGADENOSON 0.4 MG/5ML IV SOLN
0.4000 mg | Freq: Once | INTRAVENOUS | Status: AC
  Administered 2023-09-08: 0.4 mg via INTRAVENOUS

## 2023-09-09 ENCOUNTER — Telehealth: Payer: Self-pay

## 2023-09-09 NOTE — Telephone Encounter (Signed)
 Spoke with pt and advised that surgical clearance had been sent.

## 2023-09-14 DIAGNOSIS — M25561 Pain in right knee: Secondary | ICD-10-CM | POA: Diagnosis not present

## 2023-09-14 DIAGNOSIS — M1711 Unilateral primary osteoarthritis, right knee: Secondary | ICD-10-CM | POA: Diagnosis not present

## 2023-09-14 LAB — HEPATIC FUNCTION PANEL
ALT: 24 U/L (ref 10–40)
AST: 20 (ref 14–40)
Alkaline Phosphatase: 78 (ref 25–125)
Bilirubin, Total: 0.4

## 2023-09-14 LAB — BASIC METABOLIC PANEL WITH GFR
BUN: 22 — AB (ref 4–21)
CO2: 30 — AB (ref 13–22)
Chloride: 107 (ref 99–108)
Creatinine: 0.8 (ref 0.6–1.3)
Glucose: 132
Potassium: 4.6 meq/L (ref 3.5–5.1)
Sodium: 142 (ref 137–147)

## 2023-09-14 LAB — CBC AND DIFFERENTIAL
HCT: 44 (ref 41–53)
Hemoglobin: 14.9 (ref 13.5–17.5)
Neutrophils Absolute: 4613
Platelets: 165 10*3/uL (ref 150–400)
WBC: 7.6

## 2023-09-14 LAB — COMPREHENSIVE METABOLIC PANEL WITH GFR
Albumin: 4.4 (ref 3.5–5.0)
Calcium: 9.2 (ref 8.7–10.7)
Globulin: 2.3
eGFR: 100

## 2023-09-14 LAB — CBC: RBC: 4.91 (ref 3.87–5.11)

## 2023-09-17 ENCOUNTER — Ambulatory Visit: Payer: BC Managed Care – PPO | Admitting: Cardiology

## 2023-09-21 ENCOUNTER — Ambulatory Visit (HOSPITAL_BASED_OUTPATIENT_CLINIC_OR_DEPARTMENT_OTHER)
Admission: RE | Admit: 2023-09-21 | Discharge: 2023-09-21 | Disposition: A | Discharge status: Home / Self Care | Source: Ambulatory Visit | Attending: Cardiology | Admitting: Cardiology

## 2023-09-21 DIAGNOSIS — I700 Atherosclerosis of aorta: Secondary | ICD-10-CM | POA: Diagnosis not present

## 2023-09-21 DIAGNOSIS — Z01810 Encounter for preprocedural cardiovascular examination: Secondary | ICD-10-CM | POA: Diagnosis not present

## 2023-09-21 LAB — ECHOCARDIOGRAM COMPLETE
AR max vel: 2.17 cm2
AV Area VTI: 2.28 cm2
AV Area mean vel: 2.25 cm2
AV Mean grad: 6 mmHg
AV Peak grad: 11.4 mmHg
AV Vena cont: 0.3 cm
Ao pk vel: 1.69 m/s
Area-P 1/2: 5.54 cm2
Calc EF: 65.6 %
MV M vel: 3.52 m/s
MV Peak grad: 49.7 mmHg
P 1/2 time: 1085 ms
S' Lateral: 2.7 cm
Single Plane A2C EF: 68.7 %
Single Plane A4C EF: 63.3 %

## 2023-09-29 DIAGNOSIS — M25561 Pain in right knee: Secondary | ICD-10-CM | POA: Diagnosis not present

## 2023-09-29 LAB — HEMOGLOBIN A1C: Hemoglobin A1C: 6.1

## 2023-10-02 ENCOUNTER — Other Ambulatory Visit (HOSPITAL_COMMUNITY): Payer: Self-pay

## 2023-10-02 MED ORDER — DOXYCYCLINE HYCLATE 100 MG PO TABS
200.0000 mg | ORAL_TABLET | Freq: Once | ORAL | 0 refills | Status: AC
Start: 2023-10-02 — End: 2023-10-04
  Filled 2023-10-02: qty 2, 1d supply, fill #0

## 2023-10-03 ENCOUNTER — Other Ambulatory Visit (HOSPITAL_COMMUNITY): Payer: Self-pay

## 2023-10-12 DIAGNOSIS — R269 Unspecified abnormalities of gait and mobility: Secondary | ICD-10-CM | POA: Diagnosis not present

## 2023-10-12 DIAGNOSIS — M25661 Stiffness of right knee, not elsewhere classified: Secondary | ICD-10-CM | POA: Diagnosis not present

## 2023-10-12 DIAGNOSIS — M1731 Unilateral post-traumatic osteoarthritis, right knee: Secondary | ICD-10-CM | POA: Diagnosis not present

## 2023-10-12 DIAGNOSIS — M6281 Muscle weakness (generalized): Secondary | ICD-10-CM | POA: Diagnosis not present

## 2023-10-13 DIAGNOSIS — M25561 Pain in right knee: Secondary | ICD-10-CM | POA: Diagnosis not present

## 2023-10-14 ENCOUNTER — Ambulatory Visit: Payer: BC Managed Care – PPO | Admitting: Family Medicine

## 2023-10-15 ENCOUNTER — Other Ambulatory Visit (HOSPITAL_COMMUNITY): Payer: Self-pay

## 2023-10-15 MED ORDER — ONDANSETRON 4 MG PO TBDP
4.0000 mg | ORAL_TABLET | Freq: Three times a day (TID) | ORAL | 0 refills | Status: DC | PRN
  Filled 2023-10-15: qty 14, 5d supply, fill #0

## 2023-10-15 MED ORDER — GABAPENTIN 300 MG PO CAPS
300.0000 mg | ORAL_CAPSULE | Freq: Every day | ORAL | 0 refills | Status: DC
Start: 2023-10-15 — End: 2023-11-16
  Filled 2023-10-15: qty 28, 28d supply, fill #0

## 2023-10-15 MED ORDER — CELECOXIB 100 MG PO CAPS
100.0000 mg | ORAL_CAPSULE | Freq: Two times a day (BID) | ORAL | 1 refills | Status: DC
  Filled 2023-10-15: qty 28, 14d supply, fill #0

## 2023-10-15 MED ORDER — OXYCODONE HCL 5 MG PO TABS
5.0000 mg | ORAL_TABLET | ORAL | 0 refills | Status: DC | PRN
Start: 2023-10-15 — End: 2023-11-16
  Filled 2023-10-15: qty 40, 7d supply, fill #0

## 2023-10-15 MED ORDER — METHOCARBAMOL 500 MG PO TABS
500.0000 mg | ORAL_TABLET | Freq: Four times a day (QID) | ORAL | 0 refills | Status: DC | PRN
  Filled 2023-10-15: qty 30, 8d supply, fill #0

## 2023-10-15 MED ORDER — ELIQUIS 2.5 MG PO TABS
2.5000 mg | ORAL_TABLET | Freq: Two times a day (BID) | ORAL | 0 refills | Status: DC
  Filled 2023-10-15: qty 60, 30d supply, fill #0

## 2023-10-16 DIAGNOSIS — M1711 Unilateral primary osteoarthritis, right knee: Secondary | ICD-10-CM | POA: Diagnosis not present

## 2023-11-11 NOTE — Patient Instructions (Addendum)
 It was great to see you again today, I will be in touch with your blood work asap Assuming all is well please see me in 6 months You got your pneumonia booster today- "prevnar 20"- you should be all set on pneumonia vaccines now  Please let me know if your BP does not come back to your normal baseline

## 2023-11-11 NOTE — Progress Notes (Signed)
 [facility] [address] [place], [place] [address] [phone]  Date:  11/16/2023   Name:  Steven Hodge   DOB:  November 10, 1960   MRN:  [id number]  PCP:  Kaylee Partridge, MD    Chief Complaint: No chief complaint on file.   History of Present Illness:  Steven Hodge is a 63 y.o. very pleasant male patient who presents with the following:  Patient seen today for periodic follow-up.  Most recent visit with myself was in February for preoperative visit prior to right knee replacement.  This surgery was completed one month ago He has dealt with swelling but otherwise his knee is doing great, he is ahead of schedule on his rehab He is still doing PT twice a week  His leg does swell significantly, this gets better overnight or with elevation.  He was told not to continue wearing compression.  It is not really painful but just appears swollen  History of hyperlipidemia, diet controlled diabetes, hypertension, gout He underwent gastric bypass surgery in April 2021- has done great since    History of statin intolerance Uses gabapentin  at bedtime as needed for muscle stiffness and pain He saw his cardiologist at the end of March also for preoperative follow-up.  They did do a nuclear stress-he did well, results were reassuring.  He was cleared for surgery  Can update pneumonia vaccine series-give dose of Prevnar 20 He has completed [facility] exam- he does keep up with this but cannot recall exactly where he is seeing right now Due for A1c update; see below His PSA had gone up a bit at last check of this still under 1.  Will follow-up today to assess velocity He is on eliquis  for DVT prevention- he is about done with this  He is using some aleve and tylenol  for pain control Lab Results  Component Value Date   PSA1 0.6 06/03/2017   PSA 0.89 04/15/2023   PSA 0.46 04/02/2022   PSA 0.42 04/03/2021    Lab Results   Component Value Date   HGBA1C 6.2 04/15/2023   BP Readings from Last 3 Encounters:  11/16/23 (!) 150/90  09/03/23 136/74  08/03/23 124/78   He notes they did an A[facility]c at [facility] just recently and it was ok but he does not recall exactly what it was -he was told looks fine  Patient Active Problem List   Diagnosis Date Noted   Aortic atherosclerosis (HCC) 09/03/2023   Preop cardiovascular exam 09/03/2023   Left bundle branch block 09/03/2023   Obesity (BMI 30.0-34.9) 09/03/2023   Cardiac murmur 09/03/2023   Arthritis    Back pain    Diabetes mellitus without complication (HCC)    Gout    Hx of blood clots    H/O gastric bypass 10/16/2019   Severe obesity (HCC) 10/03/2019   Fatty liver 07/21/2019   Other hyperlipidemia 05/07/2019   Type 2 diabetes mellitus with hyperglycemia, without long-term current use of insulin  (HCC) 07/06/2017   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   Vitamin D  deficiency 11/18/2016   Essential hypertension 09/18/2016   History of colonic polyps 08/20/2016   Cancer (HCC) 06/09/2006    Past Medical History:  Diagnosis Date   Arthritis    knees shoulders hands   Back pain    Cancer (HCC) 06/09/2006   Basal cell carcinoma scalp;    Diabetes mellitus without complication (HCC)  Essential hypertension 09/18/2016   Fatty liver 07/21/2019   Gout    H/O gastric bypass 10/16/2019   History of colonic polyps 08/20/2016   Hx of blood clots    Hypertension    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   Other hyperlipidemia 05/07/2019   Severe obesity (HCC) 10/03/2019   Type 2 diabetes mellitus with hyperglycemia, without long-term current use of insulin  (HCC) 07/06/2017   Vitamin D  deficiency 11/18/2016    Past Surgical History:  Procedure Laterality Date   basel cell cancer removed  2009   GASTRIC ROUX-EN-Y N/A 10/03/2019   Procedure: LAPAROSCOPIC ROUX-EN-Y GASTRIC BYPASS, HIATAL HERNIA REPARI, WITH  UPPER ENDOSCOPY, ERAS Pathway;  Surgeon: Aldean Hummingbird, MD;  Location: [facility];  Service: General;  Laterality: N/A;   KNEE SURGERY  2005   ACL repair R   UMBILICAL HERNIA REPAIR  10/03/2019   Procedure: HERNIA REPAIR UMBILICAL ADULT;  Surgeon: Aldean Hummingbird, MD;  Location: [facility];  Service: General;;    Social History   Tobacco Use   Smoking status: Former   Smokeless tobacco: Never  Advertising account planner   Vaping status: Never Used  Substance Use Topics   Alcohol use: No   Drug use: No    Family History  Problem Relation Age of Onset   Diabetes Mother    Liver disease Mother    Obesity Mother    Cancer Father 66       lung cancer   Diabetes Father    Cancer Sister 34       lung cancer   Heart disease Brother 41       cardiac stenting/CAD   Cancer Brother        skin cancer   Arthritis Brother     No Known Allergies  Medication list has been reviewed and updated.  Current Outpatient Medications on File Prior to Visit  Medication Sig Dispense Refill   apixaban  (ELIQUIS ) 2.5 MG TABS tablet Take 1 tablet (2.5 mg total) by mouth 2 (two) times daily to start the morning after surgery to help prevent a blood clot. 60 tablet 0   blood glucose meter kit and supplies Dispense based on patient and insurance preference. Use up to four times daily as directed. (FOR ICD-9 250.00, 250.01). 1 each 0   Glucosamine 500 MG CAPS Take 1,000 mg by mouth daily.     glucose blood (CONTOUR NEXT TEST) test strip Use as instructed- check glucose up to once a day as needed 100 strip 2   Multiple Vitamin (MULTIVITAMIN) tablet Take 1 tablet by mouth daily.     Turmeric 400 MG CAPS Take 1 capsule by mouth daily.     No current facility-administered medications on file prior to visit.    Review of Systems:  As per HPI- otherwise negative.   Physical Examination: Vitals:   11/16/23 0805 11/16/23 0828  BP: (!) 157/78 (!) 150/90  Pulse: 69    Vitals:   11/16/23 0805  Weight: 220 lb 3.2 oz (99.9 kg)   Height: 5' 8 (1.727 m)   Body mass index is 33.48 kg/m. Ideal Body Weight: Weight in (lb) to have BMI = 25: 164.1  GEN: no acute distress.  Overweight, looks well  HEENT: Atraumatic, Normocephalic.  Ears and Nose: No external deformity. CV: RRR, No M/G/R. No JVD. No thrill. No extra heart sounds. PULM: CTA B, no wheezes, crackles, rhonchi. No retractions. No resp. distress. No accessory muscle use. ABD: S, NT,  ND, +BS. No rebound. No HSM. EXTR: No c/c/e PSYCH: Normally interactive. Conversant.  Swelling right calf and ankle, foot.  Scar over right knee is healing nicely  Assessment and Plan: Essential hypertension  Type 2 diabetes mellitus with hyperglycemia, without long-term current use of insulin  (HCC)  Other hyperlipidemia  H/O gastric bypass - Plan: CBC, Basic metabolic panel with GFR  Increased prostate specific antigen (PSA) velocity - Plan: PSA  Chronic bilateral low back pain with sciatica, sciatica laterality unspecified - Plan: gabapentin  (NEURONTIN ) 300 MG capsule  Immunization due - Plan: Pneumococcal conjugate vaccine 20-valent (Prevnar 20)  Patient seen today for periodic follow-up He notes his A1c was checked just recently at [facility], I will request this report Blood pressure is mildly elevated today, he monitors his blood pressure regularly at home and notes it is really always no higher than 130/85.  He will continue to monitor this and let me know if any changes Follow-up of mild increase of PSA Gave Prevnar 20 Will plan further follow- up pending labs.   Signed Gates Kasal, MD  Addendum 6/12, received labs as below.  Message to patient  Results for orders placed or performed in visit on 11/16/23  CBC   Collection Time: 11/16/23  8:37 AM  Result Value Ref Range   WBC 6.4 4.0 - 10.5 K/uL   RBC 4.75 4.22 - 5.81 Mil/uL   Platelets 190.0 150.0 - 400.0 K/uL   Hemoglobin 14.2 13.0 - 17.0 g/dL   HCT 16.1 09.6 - 04.5 %   MCV 88.1 78.0 -  100.0 fl   MCHC 34.0 30.0 - 36.0 g/dL   RDW 40.9 81.1 - 91.4 %  Basic metabolic panel with GFR   Collection Time: 11/16/23  8:37 AM  Result Value Ref Range   Sodium 141 135 - 145 mEq/L   Potassium 4.4 3.5 - 5.1 mEq/L   Chloride 106 96 - 112 mEq/L   CO2 26 19 - 32 mEq/L   Glucose, Bld 161 (H) 70 - 99 mg/dL   BUN 18 6 - 23 mg/dL   Creatinine, Ser 7.82 0.40 - 1.50 mg/dL   GFR 95.62 >13.08 mL/min   Calcium  9.1 8.4 - 10.5 mg/dL  PSA   Collection Time: 11/16/23  8:37 AM  Result Value Ref Range   PSA 0.55 0.10 - 4.00 ng/mL

## 2023-11-16 ENCOUNTER — Other Ambulatory Visit (HOSPITAL_COMMUNITY): Payer: Self-pay

## 2023-11-16 ENCOUNTER — Ambulatory Visit (INDEPENDENT_AMBULATORY_CARE_PROVIDER_SITE_OTHER): Payer: BC Managed Care – PPO | Admitting: Family Medicine

## 2023-11-16 ENCOUNTER — Other Ambulatory Visit: Payer: Self-pay

## 2023-11-16 ENCOUNTER — Encounter: Payer: Self-pay | Admitting: Family Medicine

## 2023-11-16 VITALS — BP 150/90 | HR 69 | Ht 68.0 in | Wt 220.2 lb

## 2023-11-16 DIAGNOSIS — Z9884 Bariatric surgery status: Secondary | ICD-10-CM | POA: Diagnosis not present

## 2023-11-16 DIAGNOSIS — E7849 Other hyperlipidemia: Secondary | ICD-10-CM | POA: Diagnosis not present

## 2023-11-16 DIAGNOSIS — I10 Essential (primary) hypertension: Secondary | ICD-10-CM

## 2023-11-16 DIAGNOSIS — E1165 Type 2 diabetes mellitus with hyperglycemia: Secondary | ICD-10-CM | POA: Diagnosis not present

## 2023-11-16 DIAGNOSIS — M5442 Lumbago with sciatica, left side: Secondary | ICD-10-CM

## 2023-11-16 DIAGNOSIS — M5441 Lumbago with sciatica, right side: Secondary | ICD-10-CM

## 2023-11-16 DIAGNOSIS — Z23 Encounter for immunization: Secondary | ICD-10-CM | POA: Diagnosis not present

## 2023-11-16 DIAGNOSIS — G8929 Other chronic pain: Secondary | ICD-10-CM

## 2023-11-16 DIAGNOSIS — R972 Elevated prostate specific antigen [PSA]: Secondary | ICD-10-CM

## 2023-11-16 LAB — BASIC METABOLIC PANEL WITH GFR
BUN: 18 mg/dL (ref 6–23)
CO2: 26 meq/L (ref 19–32)
Calcium: 9.1 mg/dL (ref 8.4–10.5)
Chloride: 106 meq/L (ref 96–112)
Creatinine, Ser: 0.83 mg/dL (ref 0.40–1.50)
GFR: 95.03 mL/min (ref >60.00)
Glucose, Bld: 161 mg/dL — ABNORMAL HIGH (ref 70–99)
Potassium: 4.4 meq/L (ref 3.5–5.1)
Sodium: 141 meq/L (ref 135–145)

## 2023-11-16 LAB — CBC
HCT: 41.8 % (ref 39.0–52.0)
Hemoglobin: 14.2 g/dL (ref 13.0–17.0)
MCHC: 34 g/dL (ref 30.0–36.0)
MCV: 88.1 fl (ref 78.0–100.0)
Platelets: 190 10*3/uL (ref 150.0–400.0)
RBC: 4.75 Mil/uL (ref 4.22–5.81)
RDW: 13.5 % (ref 11.5–15.5)
WBC: 6.4 10*3/uL (ref 4.0–10.5)

## 2023-11-16 MED ORDER — GABAPENTIN 300 MG PO CAPS
300.0000 mg | ORAL_CAPSULE | Freq: Every day | ORAL | 1 refills | Status: DC
  Filled 2023-11-16: qty 90, 90d supply, fill #0
  Filled 2024-02-10: qty 90, 90d supply, fill #1

## 2023-11-19 ENCOUNTER — Encounter: Payer: Self-pay | Admitting: Family Medicine

## 2023-11-19 LAB — PSA: PSA: 0.55 ng/mL (ref 0.10–4.00)

## 2023-11-25 ENCOUNTER — Encounter: Payer: Self-pay | Admitting: Family Medicine

## 2023-11-27 ENCOUNTER — Encounter: Payer: Self-pay | Admitting: Family Medicine

## 2024-02-10 ENCOUNTER — Other Ambulatory Visit: Payer: Self-pay

## 2024-02-11 ENCOUNTER — Other Ambulatory Visit (HOSPITAL_COMMUNITY): Payer: Self-pay

## 2024-02-11 MED ORDER — AMOXICILLIN 500 MG PO CAPS
2000.0000 mg | ORAL_CAPSULE | ORAL | 0 refills | Status: DC
  Filled 2024-02-11 – 2024-02-12 (×2): qty 12, 3d supply, fill #0

## 2024-02-12 ENCOUNTER — Other Ambulatory Visit: Payer: Self-pay

## 2024-02-12 ENCOUNTER — Other Ambulatory Visit (HOSPITAL_BASED_OUTPATIENT_CLINIC_OR_DEPARTMENT_OTHER): Payer: Self-pay

## 2024-02-12 ENCOUNTER — Other Ambulatory Visit (HOSPITAL_COMMUNITY): Payer: Self-pay

## 2024-02-17 DIAGNOSIS — L918 Other hypertrophic disorders of the skin: Secondary | ICD-10-CM | POA: Diagnosis not present

## 2024-02-17 DIAGNOSIS — D225 Melanocytic nevi of trunk: Secondary | ICD-10-CM | POA: Diagnosis not present

## 2024-02-17 DIAGNOSIS — L821 Other seborrheic keratosis: Secondary | ICD-10-CM | POA: Diagnosis not present

## 2024-02-17 DIAGNOSIS — L820 Inflamed seborrheic keratosis: Secondary | ICD-10-CM | POA: Diagnosis not present

## 2024-02-17 DIAGNOSIS — D485 Neoplasm of uncertain behavior of skin: Secondary | ICD-10-CM | POA: Diagnosis not present

## 2024-02-17 DIAGNOSIS — Z85828 Personal history of other malignant neoplasm of skin: Secondary | ICD-10-CM | POA: Diagnosis not present

## 2024-02-17 DIAGNOSIS — L570 Actinic keratosis: Secondary | ICD-10-CM | POA: Diagnosis not present

## 2024-05-04 ENCOUNTER — Encounter (HOSPITAL_COMMUNITY): Payer: Self-pay | Admitting: *Deleted

## 2024-05-08 ENCOUNTER — Other Ambulatory Visit: Payer: Self-pay | Admitting: Family Medicine

## 2024-05-08 DIAGNOSIS — M5442 Lumbago with sciatica, left side: Secondary | ICD-10-CM

## 2024-05-09 ENCOUNTER — Other Ambulatory Visit: Payer: Self-pay

## 2024-05-09 ENCOUNTER — Other Ambulatory Visit (HOSPITAL_COMMUNITY): Payer: Self-pay

## 2024-05-09 MED ORDER — GABAPENTIN 300 MG PO CAPS
300.0000 mg | ORAL_CAPSULE | Freq: Every day | ORAL | 1 refills | Status: DC
  Filled 2024-05-09: qty 90, 90d supply, fill #0

## 2024-05-15 NOTE — Patient Instructions (Incomplete)
 It was good to see you again today, I will be in touch with your labs  Please set up a visit with your GI doc- Dr Katrinka Blazing - for your screening colonoscopy  [phone]  Please check with your insurance about coverage of GLP-1 agonist drugs for weight loss.  These drugs would include Saxenda, Wegovy, Zepbound  Please clarify with your insurance if they cover this for weight loss as opposed to diabetes.  If one of these medications is covered I am more than happy to prescribe you

## 2024-05-15 NOTE — Progress Notes (Unsigned)
 [facility] [address] [place], [place] [address] [phone]  Date:  05/18/2024   Name:  Steven Hodge   DOB:  December 31, 1960   MRN:  [id number]  PCP:  Watt Harlene BROCKS, MD    Chief Complaint: No chief complaint on file.   History of Present Illness:  Steven Hodge is a 63 y.o. very pleasant male patient who presents with the following:  Patient seen today for periodic follow-up.  I saw him most recently in June-at that time he had recently had knee replacement and was doing very well in his recovery History of hyperlipidemia, diet controlled diabetes, hypertension, gout He underwent gastric bypass surgery in April 2021- has done great since    History of statin intolerance Uses gabapentin  at bedtime as needed for muscle stiffness and pain  Can update urine micro Flu shot A1c needs update Foot exam is due Lab work done in [employer], CBC, PSA  Lab Results  Component Value Date   HGBA1C 6.1 09/29/2023   Gabapentin  300 at bedtime    Patient Active Problem List   Diagnosis Date Noted   Aortic atherosclerosis 09/03/2023   Preop cardiovascular exam 09/03/2023   Left bundle branch block 09/03/2023   Obesity (BMI 30.0-34.9) 09/03/2023   Cardiac murmur 09/03/2023   Arthritis    Back pain    Diabetes mellitus without complication (HCC)    Gout    Hx of blood clots    H/O gastric bypass 10/16/2019   Severe obesity (HCC) 10/03/2019   Fatty liver 07/21/2019   Other hyperlipidemia 05/07/2019   Type 2 diabetes mellitus with hyperglycemia, without long-term current use of insulin  (HCC) 07/06/2017   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   Vitamin D  deficiency 11/18/2016   Essential hypertension 09/18/2016   History of colonic polyps 08/20/2016   Cancer (HCC) 06/09/2006    Past Medical History:  Diagnosis Date   Arthritis    knees shoulders hands   Back pain    Cancer (HCC)  06/09/2006   Basal cell carcinoma scalp;    Diabetes mellitus without complication (HCC)    Essential hypertension 09/18/2016   Fatty liver 07/21/2019   Gout    H/O gastric bypass 10/16/2019   History of colonic polyps 08/20/2016   Hx of blood clots    Hypertension    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   Other hyperlipidemia 05/07/2019   Severe obesity (HCC) 10/03/2019   Type 2 diabetes mellitus with hyperglycemia, without long-term current use of insulin  (HCC) 07/06/2017   Vitamin D  deficiency 11/18/2016    Past Surgical History:  Procedure Laterality Date   basel cell cancer removed  2009   GASTRIC ROUX-EN-Y N/A 10/03/2019   Procedure: LAPAROSCOPIC ROUX-EN-Y GASTRIC BYPASS, HIATAL HERNIA REPARI, WITH UPPER ENDOSCOPY, ERAS Pathway;  Surgeon: Tanda Locus, MD;  Location: [facility];  Service: General;  Laterality: N/A;   KNEE SURGERY  2005   ACL repair R   UMBILICAL HERNIA REPAIR  10/03/2019   Procedure: HERNIA REPAIR UMBILICAL ADULT;  Surgeon: Tanda Locus, MD;  Location: [facility];  Service: General;;    Social History   Tobacco Use   Smoking status: Former   Smokeless tobacco: Never  Vaping Use   Vaping status: Never Used  Substance Use Topics   Alcohol use: No   Drug use: No    Family History  Problem Relation Age  of Onset   Diabetes Mother    Liver disease Mother    Obesity Mother    Cancer Father 45       lung cancer   Diabetes Father    Cancer Sister 57       lung cancer   Heart disease Brother 69       cardiac stenting/CAD   Cancer Brother        skin cancer   Arthritis Brother     No Known Allergies  Medication list has been reviewed and updated.  Current Outpatient Medications on File Prior to Visit  Medication Sig Dispense Refill   amoxicillin  (AMOXIL ) 500 MG capsule Take 4 capsules (2,000 mg total) by mouth 1 hour prior to dental work. 12 capsule 0   apixaban  (ELIQUIS ) 2.5 MG TABS tablet Take 1 tablet (2.5 mg total) by  mouth 2 (two) times daily to start the morning after surgery to help prevent a blood clot. 60 tablet 0   blood glucose meter kit and supplies Dispense based on patient and insurance preference. Use up to four times daily as directed. (FOR ICD-9 250.00, 250.01). 1 each 0   gabapentin  (NEURONTIN ) 300 MG capsule Take 1 capsule (300 mg total) by mouth at bedtime. 90 capsule 1   Glucosamine 500 MG CAPS Take 1,000 mg by mouth daily.     glucose blood (CONTOUR NEXT TEST) test strip Use as instructed- check glucose up to once a day as needed 100 strip 2   Multiple Vitamin (MULTIVITAMIN) tablet Take 1 tablet by mouth daily.     Turmeric 400 MG CAPS Take 1 capsule by mouth daily.     No current facility-administered medications on file prior to visit.    Review of Systems:  As per HPI- otherwise negative.   Physical Examination: There were no vitals filed for this visit. There were no vitals filed for this visit. There is no height or weight on file to calculate BMI. Ideal Body Weight:    GEN: no acute distress. HEENT: Atraumatic, Normocephalic.  Ears and Nose: No external deformity. CV: RRR, No M/G/R. No JVD. No thrill. No extra heart sounds. PULM: CTA B, no wheezes, crackles, rhonchi. No retractions. No resp. distress. No accessory muscle use. ABD: S, NT, ND, +BS. No rebound. No HSM. EXTR: No c/c/e PSYCH: Normally interactive. Conversant.    Assessment and Plan: No diagnosis found.  Assessment & Plan   Signed Harlene Schroeder, MD

## 2024-05-18 ENCOUNTER — Ambulatory Visit: Admitting: Family Medicine

## 2024-05-18 ENCOUNTER — Other Ambulatory Visit (HOSPITAL_COMMUNITY): Payer: Self-pay

## 2024-05-18 ENCOUNTER — Other Ambulatory Visit: Payer: Self-pay

## 2024-05-18 ENCOUNTER — Encounter: Payer: Self-pay | Admitting: Family Medicine

## 2024-05-18 VITALS — BP 120/76 | HR 61 | Temp 98.1°F | Resp 16 | Ht 68.0 in | Wt 228.2 lb

## 2024-05-18 DIAGNOSIS — E1165 Type 2 diabetes mellitus with hyperglycemia: Secondary | ICD-10-CM | POA: Diagnosis not present

## 2024-05-18 DIAGNOSIS — Z13220 Encounter for screening for lipoid disorders: Secondary | ICD-10-CM

## 2024-05-18 DIAGNOSIS — I10 Essential (primary) hypertension: Secondary | ICD-10-CM | POA: Diagnosis not present

## 2024-05-18 DIAGNOSIS — Z130 Encounter for screening for diseases of the blood and blood-forming organs and certain disorders involving the immune mechanism: Secondary | ICD-10-CM

## 2024-05-18 DIAGNOSIS — G8929 Other chronic pain: Secondary | ICD-10-CM

## 2024-05-18 DIAGNOSIS — Z0000 Encounter for general adult medical examination without abnormal findings: Secondary | ICD-10-CM

## 2024-05-18 DIAGNOSIS — Z125 Encounter for screening for malignant neoplasm of prostate: Secondary | ICD-10-CM

## 2024-05-18 DIAGNOSIS — Z9884 Bariatric surgery status: Secondary | ICD-10-CM | POA: Diagnosis not present

## 2024-05-18 DIAGNOSIS — Z23 Encounter for immunization: Secondary | ICD-10-CM | POA: Diagnosis not present

## 2024-05-18 LAB — COMPREHENSIVE METABOLIC PANEL WITH GFR
ALT: 29 U/L (ref 0–53)
AST: 22 U/L (ref 0–37)
Albumin: 4.3 g/dL (ref 3.5–5.2)
Alkaline Phosphatase: 78 U/L (ref 39–117)
BUN: 17 mg/dL (ref 6–23)
CO2: 32 meq/L (ref 19–32)
Calcium: 9.2 mg/dL (ref 8.4–10.5)
Chloride: 104 meq/L (ref 96–112)
Creatinine, Ser: 0.73 mg/dL (ref 0.40–1.50)
GFR: 98.44 mL/min (ref >60.00)
Glucose, Bld: 136 mg/dL — ABNORMAL HIGH (ref 70–99)
Potassium: 4.6 meq/L (ref 3.5–5.1)
Sodium: 141 meq/L (ref 135–145)
Total Bilirubin: 0.5 mg/dL (ref 0.2–1.2)
Total Protein: 6.6 g/dL (ref 6.0–8.3)

## 2024-05-18 LAB — CBC
HCT: 46.2 % (ref 39.0–52.0)
Hemoglobin: 15.6 g/dL (ref 13.0–17.0)
MCHC: 33.7 g/dL (ref 30.0–36.0)
MCV: 87.7 fl (ref 78.0–100.0)
Platelets: 158 K/uL (ref 150.0–400.0)
RBC: 5.27 Mil/uL (ref 4.22–5.81)
RDW: 13.6 % (ref 11.5–15.5)
WBC: 6.6 K/uL (ref 4.0–10.5)

## 2024-05-18 LAB — MICROALBUMIN / CREATININE URINE RATIO
Creatinine,U: 74.7 mg/dL
Microalb Creat Ratio: UNDETERMINED mg/g (ref 0.0–30.0)
Microalb, Ur: 0.7 mg/dL (ref 0.0–1.9)

## 2024-05-18 LAB — LIPID PANEL
Cholesterol: 173 mg/dL (ref 0–200)
HDL: 57.9 mg/dL (ref >39.00)
LDL Cholesterol: 96 mg/dL (ref 0–99)
NonHDL: 115.27
Total CHOL/HDL Ratio: 3
Triglycerides: 95 mg/dL (ref 0.0–149.0)
VLDL: 19 mg/dL (ref 0.0–40.0)

## 2024-05-18 LAB — VITAMIN D 25 HYDROXY (VIT D DEFICIENCY, FRACTURES): VITD: 29.15 ng/mL — ABNORMAL LOW (ref 30.00–100.00)

## 2024-05-18 LAB — PSA: PSA: 0.61 ng/mL (ref 0.10–4.00)

## 2024-05-18 LAB — VITAMIN B12: Vitamin B-12: 414 pg/mL (ref 211–911)

## 2024-05-18 LAB — FERRITIN: Ferritin: 29 ng/mL (ref 22.0–322.0)

## 2024-05-18 LAB — HEMOGLOBIN A1C: Hgb A1c MFr Bld: 6.2 % (ref 4.6–6.5)

## 2024-05-18 MED ORDER — GABAPENTIN 300 MG PO CAPS
300.0000 mg | ORAL_CAPSULE | Freq: Every day | ORAL | 1 refills | Status: AC
  Filled 2024-05-18: qty 90, 90d supply, fill #0

## 2024-05-19 ENCOUNTER — Ambulatory Visit: Payer: Self-pay | Admitting: Family Medicine

## 2024-11-16 ENCOUNTER — Ambulatory Visit: Admitting: Family Medicine
